# Patient Record
Sex: Male | Born: 1950 | Race: White | Hispanic: No | Smoking: Former smoker
Health system: Southern US, Community
[De-identification: ages and names within clinical notes are randomized; demographics above are authoritative.]

## PROBLEM LIST (undated history)

## (undated) DIAGNOSIS — F1020 Alcohol dependence, uncomplicated: Secondary | ICD-10-CM

## (undated) DIAGNOSIS — I2510 Atherosclerotic heart disease of native coronary artery without angina pectoris: Secondary | ICD-10-CM

## (undated) DIAGNOSIS — S2249XA Multiple fractures of ribs, unspecified side, initial encounter for closed fracture: Secondary | ICD-10-CM

## (undated) DIAGNOSIS — K289 Gastrojejunal ulcer, unspecified as acute or chronic, without hemorrhage or perforation: Secondary | ICD-10-CM

## (undated) DIAGNOSIS — J40 Bronchitis, not specified as acute or chronic: Secondary | ICD-10-CM

## (undated) DIAGNOSIS — Z87440 Personal history of urinary (tract) infections: Secondary | ICD-10-CM

## (undated) DIAGNOSIS — Z8639 Personal history of other endocrine, nutritional and metabolic disease: Secondary | ICD-10-CM

## (undated) DIAGNOSIS — N400 Enlarged prostate without lower urinary tract symptoms: Secondary | ICD-10-CM

## (undated) DIAGNOSIS — K7030 Alcoholic cirrhosis of liver without ascites: Secondary | ICD-10-CM

## (undated) DIAGNOSIS — I350 Nonrheumatic aortic (valve) stenosis: Secondary | ICD-10-CM

## (undated) DIAGNOSIS — S2239XA Fracture of one rib, unspecified side, initial encounter for closed fracture: Secondary | ICD-10-CM

## (undated) DIAGNOSIS — I10 Essential (primary) hypertension: Secondary | ICD-10-CM

## (undated) DIAGNOSIS — I480 Paroxysmal atrial fibrillation: Secondary | ICD-10-CM

## (undated) DIAGNOSIS — D72829 Elevated white blood cell count, unspecified: Secondary | ICD-10-CM

## (undated) DIAGNOSIS — R7303 Prediabetes: Secondary | ICD-10-CM

## (undated) DIAGNOSIS — E871 Hypo-osmolality and hyponatremia: Secondary | ICD-10-CM

## (undated) DIAGNOSIS — J309 Allergic rhinitis, unspecified: Secondary | ICD-10-CM

## (undated) HISTORY — DX: Gastrojejunal ulcer, unspecified as acute or chronic, without hemorrhage or perforation: K28.9

## (undated) HISTORY — DX: Personal history of other endocrine, nutritional and metabolic disease: Z86.39

## (undated) HISTORY — DX: Alcoholic cirrhosis of liver without ascites: K70.30

## (undated) HISTORY — DX: Benign prostatic hyperplasia without lower urinary tract symptoms: N40.0

## (undated) HISTORY — DX: Allergic rhinitis, unspecified: J30.9

## (undated) HISTORY — DX: Essential (primary) hypertension: I10

## (undated) HISTORY — DX: Multiple fractures of ribs, unspecified side, initial encounter for closed fracture: S22.49XA

## (undated) HISTORY — PX: VASECTOMY: SHX75

## (undated) HISTORY — DX: Prediabetes: R73.03

## (undated) HISTORY — DX: Atherosclerotic heart disease of native coronary artery without angina pectoris: I25.10

## (undated) HISTORY — DX: Nonrheumatic aortic (valve) stenosis: I35.0

## (undated) HISTORY — DX: Paroxysmal atrial fibrillation: I48.0

## (undated) HISTORY — PX: EYE SURGERY: SHX253

## (undated) HISTORY — DX: Bronchitis, not specified as acute or chronic: J40

## (undated) HISTORY — DX: Alcohol dependence, uncomplicated: F10.20

## (undated) HISTORY — DX: Personal history of urinary (tract) infections: Z87.440

## (undated) HISTORY — DX: Fracture of one rib, unspecified side, initial encounter for closed fracture: S22.39XA

## (undated) HISTORY — PX: SPLENECTOMY: SUR1306

## (undated) HISTORY — PX: KNEE ARTHROPLASTY: SHX992

## (undated) HISTORY — DX: Elevated white blood cell count, unspecified: D72.829

---

## 1984-08-28 DIAGNOSIS — Z9081 Acquired absence of spleen: Secondary | ICD-10-CM | POA: Insufficient documentation

## 1984-08-28 DIAGNOSIS — Z9089 Acquired absence of other organs: Secondary | ICD-10-CM

## 1998-03-18 ENCOUNTER — Ambulatory Visit (HOSPITAL_COMMUNITY): Admission: RE | Admit: 1998-03-18 | Discharge: 1998-03-18 | Payer: Self-pay | Admitting: Cardiology

## 1998-08-18 DIAGNOSIS — E785 Hyperlipidemia, unspecified: Secondary | ICD-10-CM | POA: Insufficient documentation

## 1998-08-18 DIAGNOSIS — E782 Mixed hyperlipidemia: Secondary | ICD-10-CM | POA: Insufficient documentation

## 2003-09-29 DIAGNOSIS — L719 Rosacea, unspecified: Secondary | ICD-10-CM | POA: Insufficient documentation

## 2003-09-29 DIAGNOSIS — L219 Seborrheic dermatitis, unspecified: Secondary | ICD-10-CM | POA: Insufficient documentation

## 2005-04-26 ENCOUNTER — Ambulatory Visit: Payer: Self-pay | Admitting: Family Medicine

## 2005-05-03 ENCOUNTER — Ambulatory Visit: Payer: Self-pay | Admitting: *Deleted

## 2005-06-28 ENCOUNTER — Ambulatory Visit: Payer: Self-pay | Admitting: Family Medicine

## 2005-11-21 ENCOUNTER — Ambulatory Visit: Payer: Self-pay | Admitting: Family Medicine

## 2006-01-26 ENCOUNTER — Encounter (INDEPENDENT_AMBULATORY_CARE_PROVIDER_SITE_OTHER): Payer: Self-pay | Admitting: Family Medicine

## 2006-01-26 LAB — CONVERTED CEMR LAB: PSA: NORMAL ng/mL

## 2006-02-01 ENCOUNTER — Ambulatory Visit: Payer: Self-pay | Admitting: Family Medicine

## 2006-02-14 ENCOUNTER — Emergency Department (HOSPITAL_COMMUNITY): Admission: EM | Admit: 2006-02-14 | Discharge: 2006-02-14 | Payer: Self-pay | Admitting: Emergency Medicine

## 2006-02-14 IMAGING — CR DG CHEST 1V PORT
1 series · 1 of 1 positions shown · non-contrast
Comparison: none

CLINICAL DATA: Chest pain.
 PORTABLE CHEST - 1 VIEW:

[view not recorded]
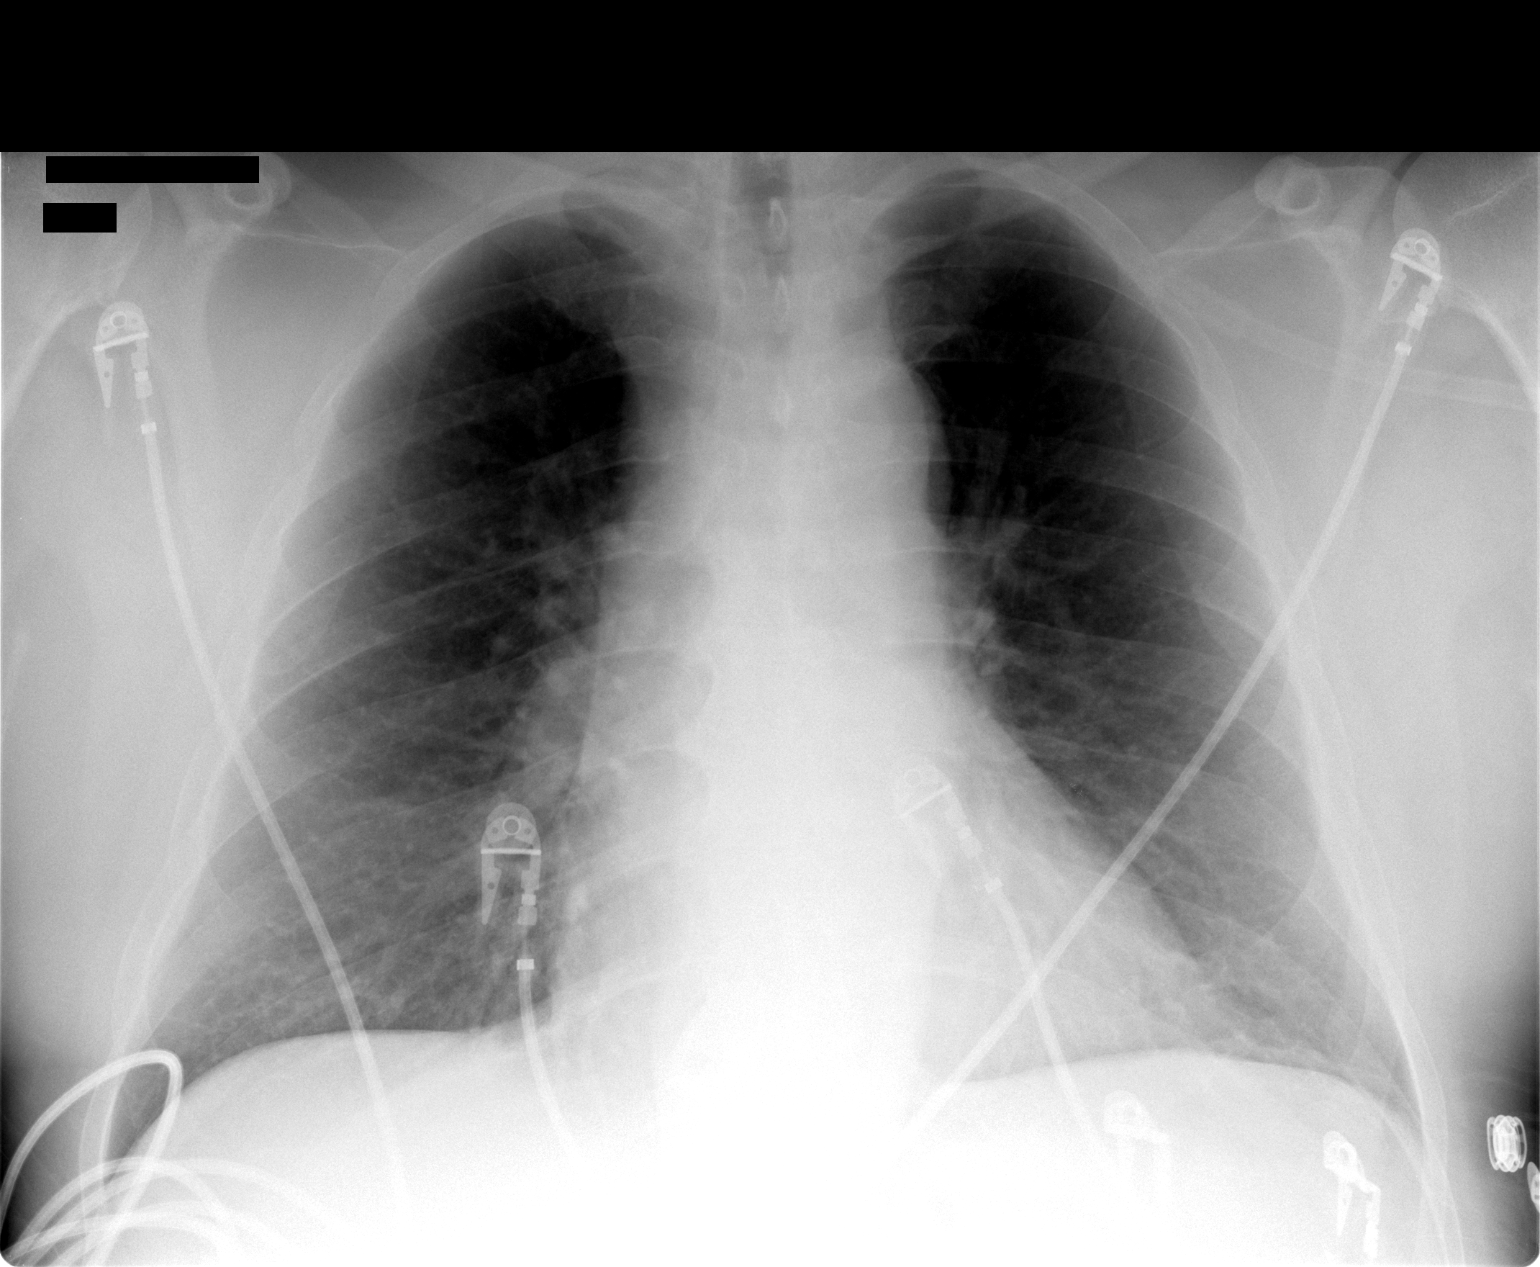

[1 of 1 positions shown; findings below may reference images not displayed]

FINDINGS: The heart size is normal.  There are no effusions.  Mild atelectasis is seen at the left base.
IMPRESSION: Mild atelectasis left base.

## 2006-02-20 ENCOUNTER — Ambulatory Visit: Payer: Self-pay | Admitting: Family Medicine

## 2006-03-21 ENCOUNTER — Ambulatory Visit: Payer: Self-pay | Admitting: Family Medicine

## 2006-12-18 ENCOUNTER — Ambulatory Visit: Payer: Self-pay | Admitting: Family Medicine

## 2006-12-25 ENCOUNTER — Ambulatory Visit: Payer: Self-pay | Admitting: Family Medicine

## 2007-01-05 ENCOUNTER — Encounter (INDEPENDENT_AMBULATORY_CARE_PROVIDER_SITE_OTHER): Payer: Self-pay | Admitting: Family Medicine

## 2007-01-05 DIAGNOSIS — Z862 Personal history of diseases of the blood and blood-forming organs and certain disorders involving the immune mechanism: Secondary | ICD-10-CM | POA: Insufficient documentation

## 2007-01-05 DIAGNOSIS — I10 Essential (primary) hypertension: Secondary | ICD-10-CM | POA: Insufficient documentation

## 2007-01-05 DIAGNOSIS — I2510 Atherosclerotic heart disease of native coronary artery without angina pectoris: Secondary | ICD-10-CM

## 2007-01-13 DIAGNOSIS — F1010 Alcohol abuse, uncomplicated: Secondary | ICD-10-CM

## 2007-01-13 DIAGNOSIS — F1021 Alcohol dependence, in remission: Secondary | ICD-10-CM | POA: Insufficient documentation

## 2007-05-15 ENCOUNTER — Encounter (INDEPENDENT_AMBULATORY_CARE_PROVIDER_SITE_OTHER): Payer: Self-pay | Admitting: *Deleted

## 2007-10-03 ENCOUNTER — Ambulatory Visit: Payer: Self-pay | Admitting: Nurse Practitioner

## 2007-12-23 ENCOUNTER — Ambulatory Visit: Payer: Self-pay | Admitting: Family Medicine

## 2007-12-23 DIAGNOSIS — J309 Allergic rhinitis, unspecified: Secondary | ICD-10-CM | POA: Insufficient documentation

## 2007-12-31 ENCOUNTER — Ambulatory Visit: Payer: Self-pay | Admitting: Family Medicine

## 2008-01-02 LAB — CONVERTED CEMR LAB
AST: 38 units/L — ABNORMAL HIGH (ref 0–37)
Alkaline Phosphatase: 69 units/L (ref 39–117)
BUN: 7 mg/dL (ref 6–23)
Basophils Absolute: 0.1 10*3/uL (ref 0.0–0.1)
Chloride: 100 meq/L (ref 96–112)
Creatinine, Ser: 0.71 mg/dL (ref 0.40–1.50)
Eosinophils Relative: 3 % (ref 0–5)
HCT: 45.3 % (ref 39.0–52.0)
Hemoglobin: 14.9 g/dL (ref 13.0–17.0)
MCHC: 32.9 g/dL (ref 30.0–36.0)
Monocytes Absolute: 1 10*3/uL (ref 0.1–1.0)
Neutro Abs: 5.2 10*3/uL (ref 1.7–7.7)
Neutrophils Relative %: 46 % (ref 43–77)
Platelets: 395 10*3/uL (ref 150–400)
RDW: 14.1 % (ref 11.5–15.5)
TSH: 1.482 microintl units/mL (ref 0.350–5.50)
Total Bilirubin: 0.7 mg/dL (ref 0.3–1.2)
Total CHOL/HDL Ratio: 2.5
WBC: 11.5 10*3/uL — ABNORMAL HIGH (ref 4.0–10.5)

## 2008-01-07 ENCOUNTER — Ambulatory Visit: Payer: Self-pay | Admitting: Family Medicine

## 2008-01-07 LAB — CONVERTED CEMR LAB
Blood Glucose, Fingerstick: 154
Hgb A1c MFr Bld: 5.6 %

## 2008-01-13 ENCOUNTER — Encounter (INDEPENDENT_AMBULATORY_CARE_PROVIDER_SITE_OTHER): Payer: Self-pay | Admitting: Family Medicine

## 2008-01-13 LAB — CONVERTED CEMR LAB
HCV Ab: NEGATIVE
Hep B Core Total Ab: NEGATIVE
Hep B E Ab: NEGATIVE

## 2009-05-21 ENCOUNTER — Telehealth: Payer: Self-pay | Admitting: Physician Assistant

## 2009-06-01 ENCOUNTER — Ambulatory Visit: Payer: Self-pay | Admitting: Physician Assistant

## 2009-06-01 LAB — CONVERTED CEMR LAB
Cholesterol, target level: 200 mg/dL
HDL goal, serum: 40 mg/dL

## 2009-06-02 ENCOUNTER — Encounter: Payer: Self-pay | Admitting: Physician Assistant

## 2009-06-04 ENCOUNTER — Encounter: Payer: Self-pay | Admitting: Physician Assistant

## 2009-06-04 LAB — CONVERTED CEMR LAB
AST: 69 units/L — ABNORMAL HIGH (ref 0–37)
Albumin: 4.3 g/dL (ref 3.5–5.2)
BUN: 7 mg/dL (ref 6–23)
CO2: 22 meq/L (ref 19–32)
Calcium: 9.6 mg/dL (ref 8.4–10.5)
Chloride: 92 meq/L — ABNORMAL LOW (ref 96–112)
Cholesterol: 199 mg/dL (ref 0–200)
Eosinophils Relative: 1 % (ref 0–5)
HCT: 48 % (ref 39.0–52.0)
Lymphocytes Relative: 39 % (ref 12–46)
Lymphs Abs: 4.6 10*3/uL — ABNORMAL HIGH (ref 0.7–4.0)
MCHC: 35.4 g/dL (ref 30.0–36.0)
Monocytes Relative: 12 % (ref 3–12)
Neutrophils Relative %: 47 % (ref 43–77)
RBC: 4.75 M/uL (ref 4.22–5.81)
Sodium: 132 meq/L — ABNORMAL LOW (ref 135–145)
Total Protein: 8.6 g/dL — ABNORMAL HIGH (ref 6.0–8.3)
Triglycerides: 122 mg/dL (ref <150)
VLDL: 24 mg/dL (ref 0–40)
WBC: 12 10*3/uL — ABNORMAL HIGH (ref 4.0–10.5)

## 2009-06-07 LAB — CONVERTED CEMR LAB: Folate: >20 ng/mL

## 2009-06-15 ENCOUNTER — Telehealth: Payer: Self-pay | Admitting: Physician Assistant

## 2009-06-18 ENCOUNTER — Telehealth: Payer: Self-pay | Admitting: Physician Assistant

## 2009-06-30 ENCOUNTER — Ambulatory Visit: Payer: Self-pay | Admitting: Physician Assistant

## 2009-07-01 DIAGNOSIS — E739 Lactose intolerance, unspecified: Secondary | ICD-10-CM | POA: Insufficient documentation

## 2009-07-01 DIAGNOSIS — E7439 Other disorders of intestinal carbohydrate absorption: Secondary | ICD-10-CM | POA: Insufficient documentation

## 2009-07-02 ENCOUNTER — Ambulatory Visit: Payer: Self-pay | Admitting: Physician Assistant

## 2009-07-19 LAB — CONVERTED CEMR LAB
Alkaline Phosphatase: 80 units/L (ref 39–117)
CO2: 22 meq/L (ref 19–32)
Calcium: 9.7 mg/dL (ref 8.4–10.5)
Creatinine, Ser: 0.72 mg/dL (ref 0.40–1.50)
Sodium: 133 meq/L — ABNORMAL LOW (ref 135–145)
Total Bilirubin: 0.7 mg/dL (ref 0.3–1.2)
Total Protein: 8.1 g/dL (ref 6.0–8.3)

## 2009-08-28 HISTORY — PX: PROSTATECTOMY: SHX69

## 2009-09-30 ENCOUNTER — Ambulatory Visit: Payer: Self-pay | Admitting: Physician Assistant

## 2009-09-30 ENCOUNTER — Telehealth: Payer: Self-pay | Admitting: Physician Assistant

## 2009-09-30 DIAGNOSIS — N401 Enlarged prostate with lower urinary tract symptoms: Secondary | ICD-10-CM

## 2009-09-30 DIAGNOSIS — N400 Enlarged prostate without lower urinary tract symptoms: Secondary | ICD-10-CM | POA: Insufficient documentation

## 2009-09-30 DIAGNOSIS — M7020 Olecranon bursitis, unspecified elbow: Secondary | ICD-10-CM

## 2009-10-05 ENCOUNTER — Ambulatory Visit: Payer: Self-pay | Admitting: Physician Assistant

## 2009-10-05 ENCOUNTER — Encounter: Payer: Self-pay | Admitting: Physician Assistant

## 2009-10-11 ENCOUNTER — Telehealth: Payer: Self-pay | Admitting: Physician Assistant

## 2009-10-14 ENCOUNTER — Encounter: Payer: Self-pay | Admitting: Physician Assistant

## 2009-10-14 LAB — CONVERTED CEMR LAB: Osmolality, Ur: 177 mOsm/kg — ABNORMAL LOW (ref 390–1090)

## 2009-10-21 ENCOUNTER — Telehealth: Payer: Self-pay | Admitting: Physician Assistant

## 2009-11-17 ENCOUNTER — Ambulatory Visit: Payer: Self-pay | Admitting: Physician Assistant

## 2009-11-17 DIAGNOSIS — D72829 Elevated white blood cell count, unspecified: Secondary | ICD-10-CM | POA: Insufficient documentation

## 2009-11-22 LAB — CONVERTED CEMR LAB
Alkaline Phosphatase: 84 units/L (ref 39–117)
BUN: 6 mg/dL (ref 6–23)
Basophils Absolute: 0.1 10*3/uL (ref 0.0–0.1)
Basophils Relative: 0 % (ref 0–1)
CO2: 22 meq/L (ref 19–32)
Calcium: 9.5 mg/dL (ref 8.4–10.5)
Chloride: 90 meq/L — ABNORMAL LOW (ref 96–112)
Eosinophils Absolute: 0.2 10*3/uL (ref 0.0–0.7)
Eosinophils Relative: 1 % (ref 0–5)
Glucose, Bld: 119 mg/dL — ABNORMAL HIGH (ref 70–99)
HCT: 38.8 % — ABNORMAL LOW (ref 39.0–52.0)
Lymphocytes Relative: 33 % (ref 12–46)
MCHC: 32 g/dL (ref 30.0–36.0)
MCV: 106.3 fL — ABNORMAL HIGH (ref 78.0–100.0)
Neutrophils Relative %: 56 % (ref 43–77)
Osmolality, Ur: 285 mOsm/kg — ABNORMAL LOW (ref 390–1090)
Platelets: 392 10*3/uL (ref 150–400)
RBC: 3.65 M/uL — ABNORMAL LOW (ref 4.22–5.81)
RDW: 14.9 % (ref 11.5–15.5)
Sodium, Ur: 45 meq/L
WBC: 17 10*3/uL — ABNORMAL HIGH (ref 4.0–10.5)

## 2009-12-01 ENCOUNTER — Ambulatory Visit: Payer: Self-pay | Admitting: Physician Assistant

## 2009-12-03 LAB — CONVERTED CEMR LAB
Retic Ct Pct: 2.5 % (ref 0.4–3.1)
TIBC: 329 ug/dL (ref 215–435)
UIBC: 219 ug/dL

## 2009-12-06 ENCOUNTER — Encounter: Payer: Self-pay | Admitting: Physician Assistant

## 2009-12-06 LAB — CONVERTED CEMR LAB: Vitamin B-12: >2000 pg/mL — ABNORMAL HIGH (ref 211–911)

## 2009-12-08 ENCOUNTER — Ambulatory Visit (HOSPITAL_COMMUNITY): Admission: RE | Admit: 2009-12-08 | Discharge: 2009-12-08 | Payer: Self-pay | Admitting: Internal Medicine

## 2009-12-08 ENCOUNTER — Ambulatory Visit: Payer: Self-pay | Admitting: Cardiovascular Disease

## 2009-12-08 ENCOUNTER — Encounter (INDEPENDENT_AMBULATORY_CARE_PROVIDER_SITE_OTHER): Payer: Self-pay | Admitting: Internal Medicine

## 2009-12-08 ENCOUNTER — Encounter: Payer: Self-pay | Admitting: Physician Assistant

## 2009-12-08 IMAGING — CR DG CHEST 2V
2 series · 2 of 2 positions shown · non-contrast
Comparison: [DATE].

CLINICAL DATA: 59-year-old male with hypertension.

CHEST - 2 VIEW

[w chest pa]
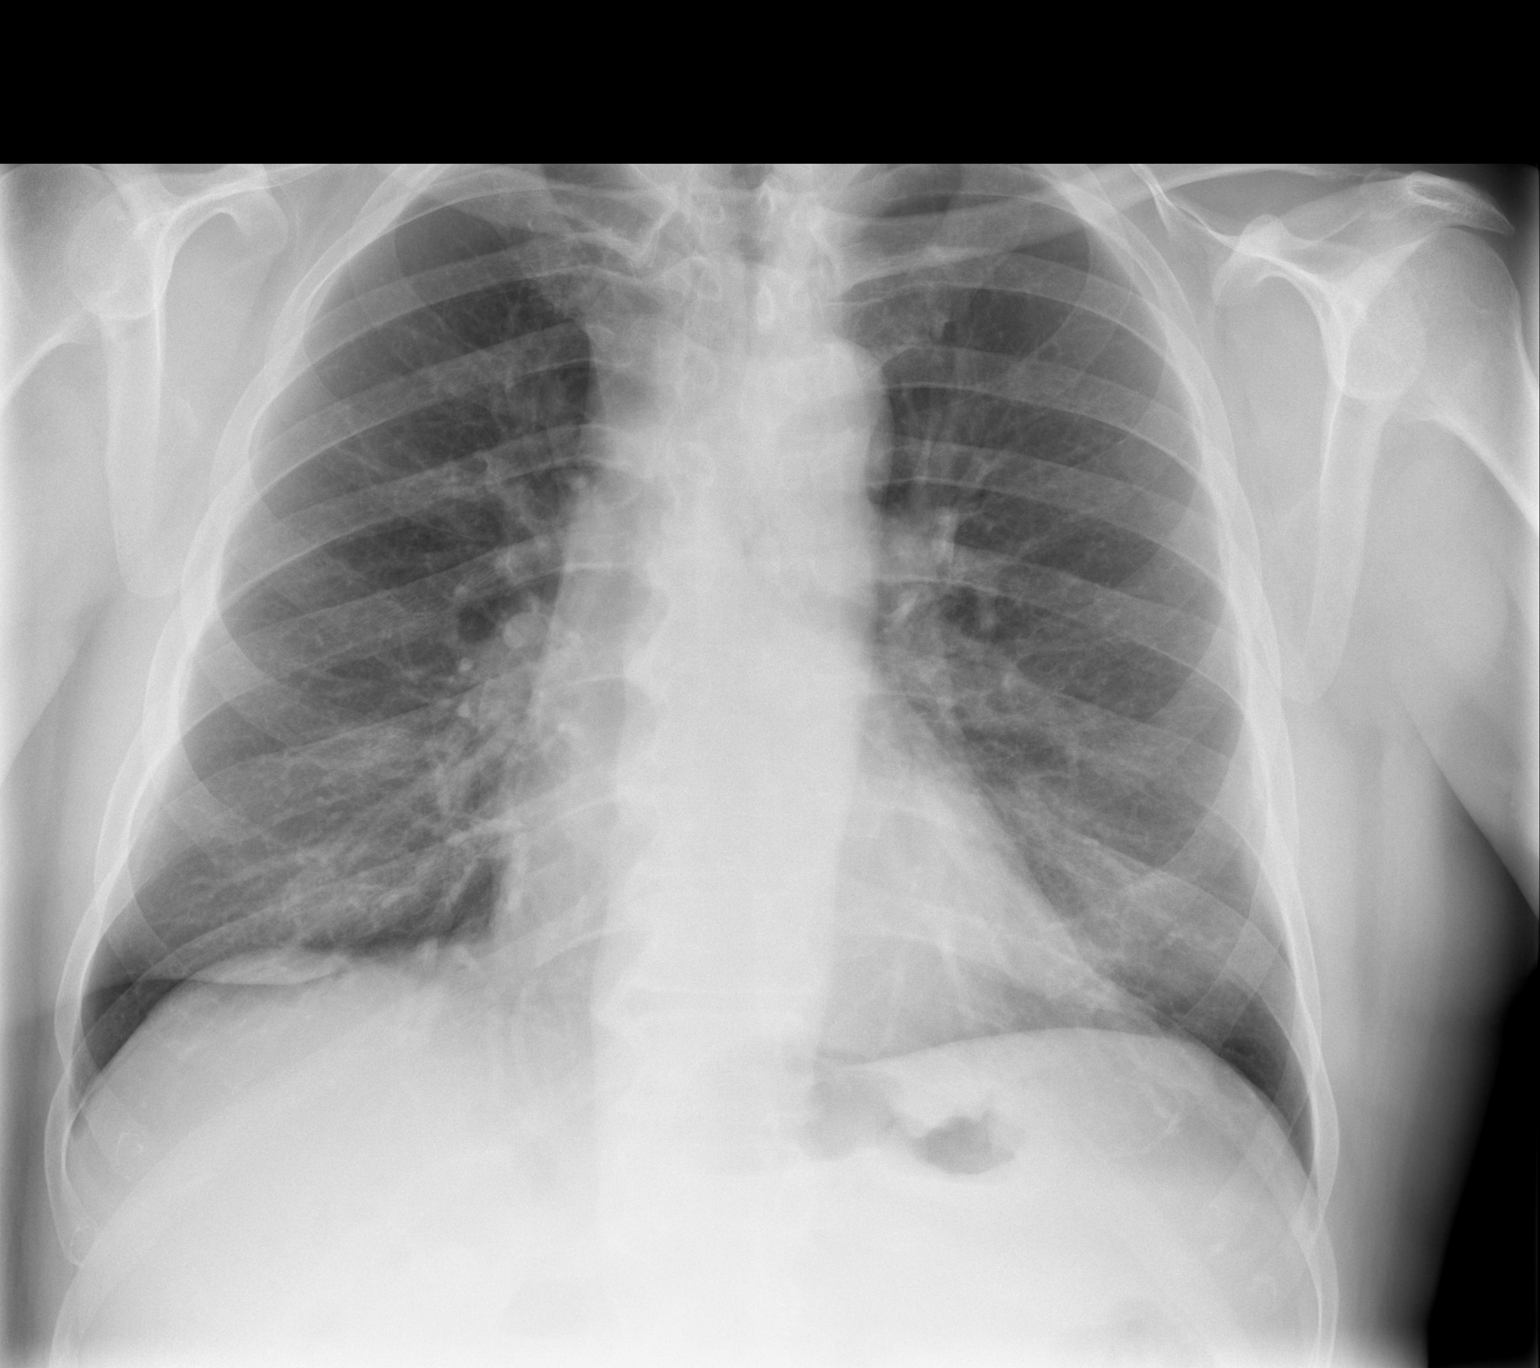

[w chest lat]
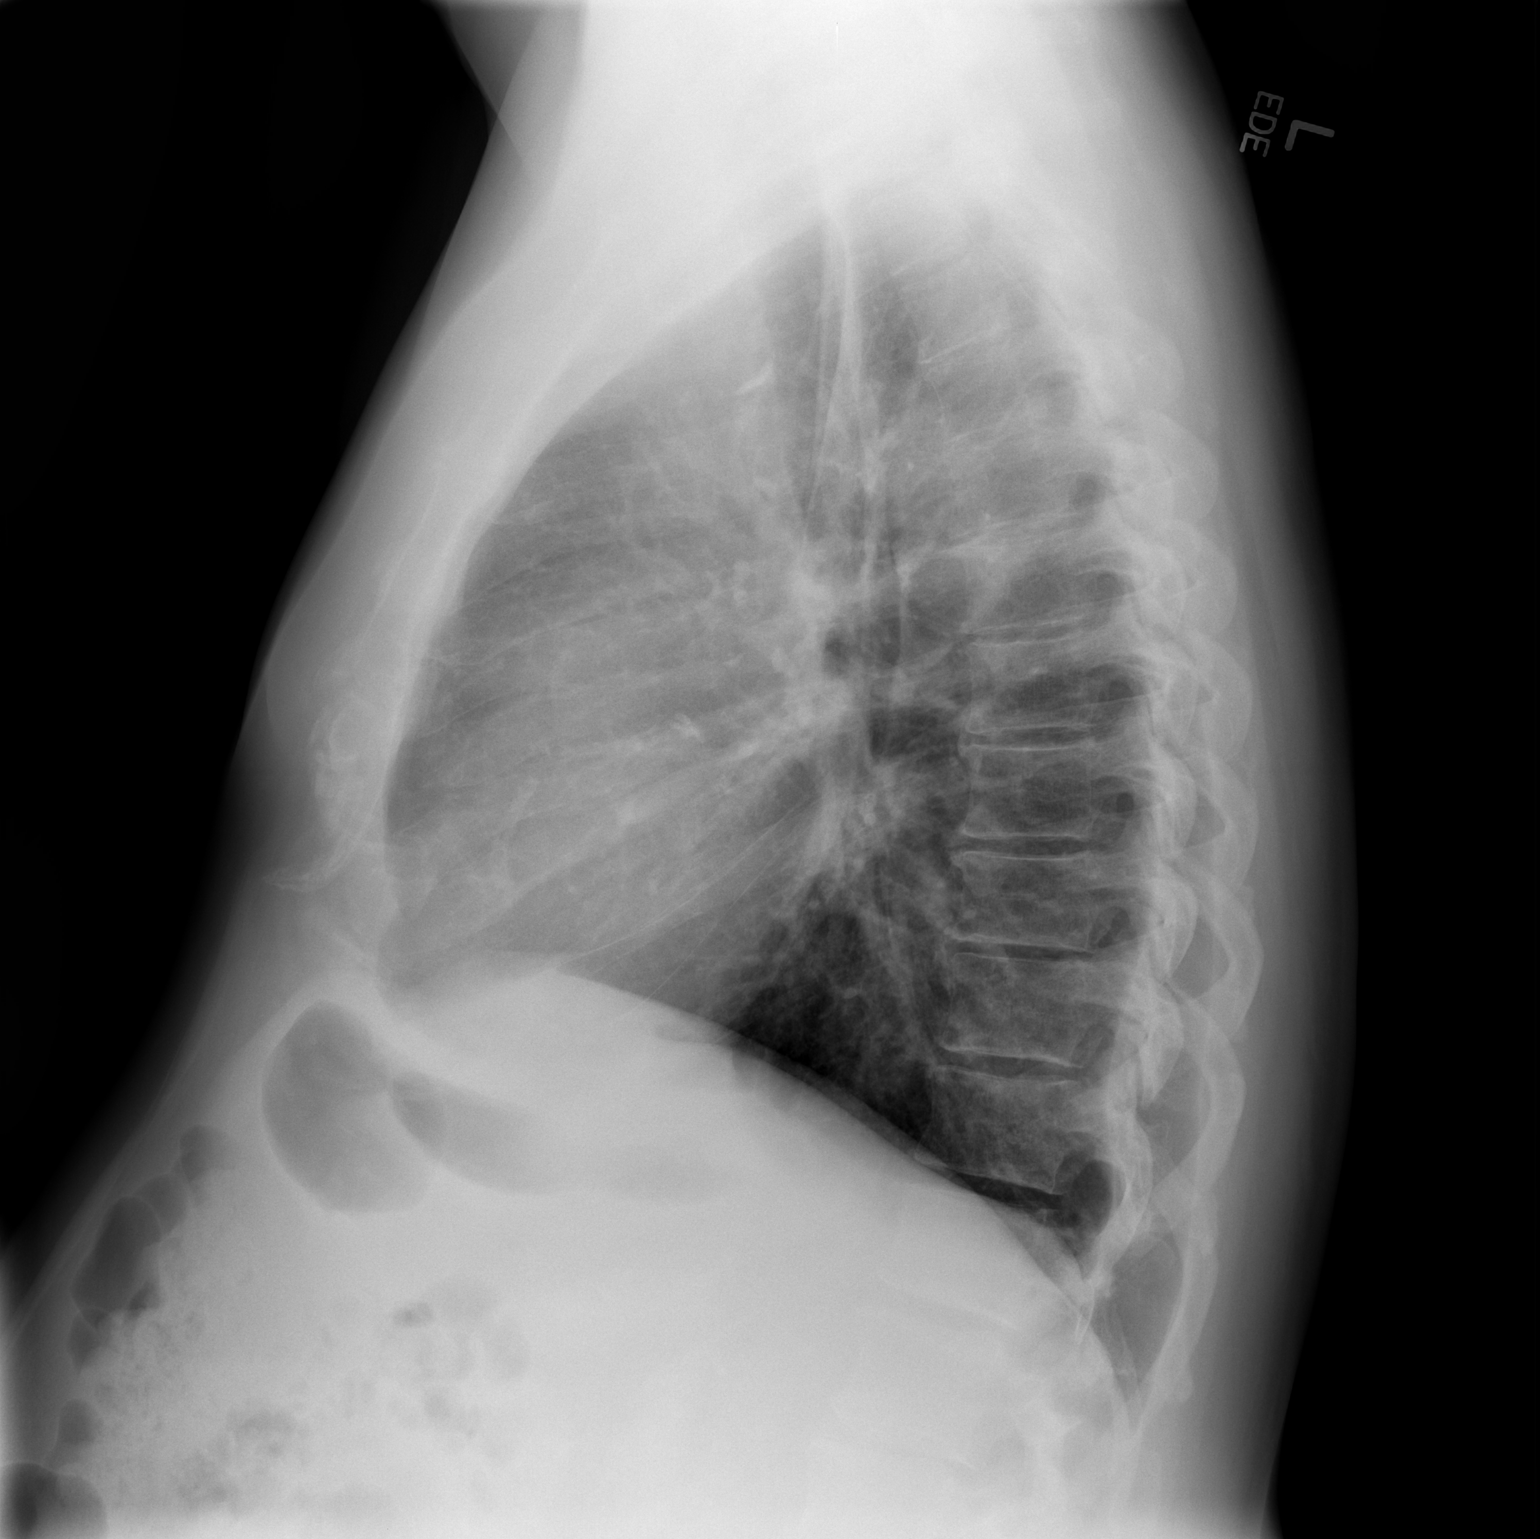

[2 of 2 positions shown; findings below may reference images not displayed]

FINDINGS: Lung volumes are within normal limits.  Stable cardiac
size and mediastinal contours.  Cardiac size is at the upper limits
of normal. Visualized tracheal air column is within normal limits.
Stable mild basilar predominant interstitial prominence.  No acute
airspace opacity or other abnormal pulmonary opacity. No acute
osseous abnormality identified.
IMPRESSION: No acute cardiopulmonary abnormality.

## 2009-12-13 ENCOUNTER — Telehealth: Payer: Self-pay | Admitting: Physician Assistant

## 2009-12-13 ENCOUNTER — Encounter: Payer: Self-pay | Admitting: Physician Assistant

## 2010-03-04 ENCOUNTER — Ambulatory Visit: Payer: Self-pay | Admitting: Physician Assistant

## 2010-03-04 DIAGNOSIS — R310 Gross hematuria: Secondary | ICD-10-CM | POA: Insufficient documentation

## 2010-03-05 ENCOUNTER — Encounter: Payer: Self-pay | Admitting: Physician Assistant

## 2010-03-07 ENCOUNTER — Encounter: Payer: Self-pay | Admitting: Physician Assistant

## 2010-03-08 ENCOUNTER — Ambulatory Visit (HOSPITAL_COMMUNITY): Admission: RE | Admit: 2010-03-08 | Discharge: 2010-03-08 | Payer: Self-pay | Admitting: Internal Medicine

## 2010-03-08 IMAGING — CT CT ABD-PELV W/O CM
2 of 4 series · 17 of 46 positions shown, 19 images · non-contrast
Comparison: None.

CLINICAL DATA: Gross hematuria

CT ABDOMEN AND PELVIS WITHOUT CONTRAST
TECHNIQUE: Multidetector CT imaging of the abdomen and pelvis was
performed following the standard protocol without intravenous
contrast.

[Series 2: renal stone · axial · 0.85mm/px · z∈[-500,-85]mm · 14 of 93 slices shown, 16 images]
[im 5/93  soft-tissue]
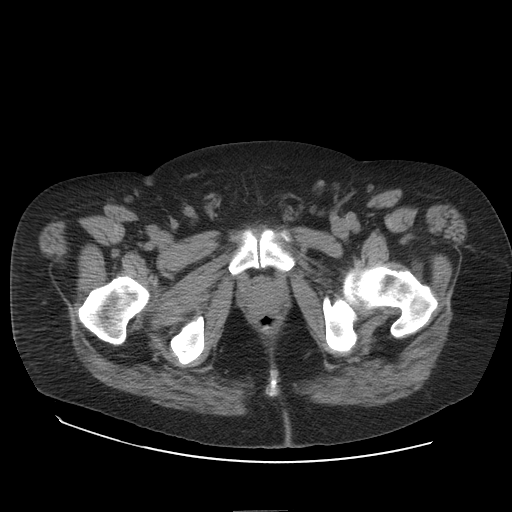
[im 5/93  bone]
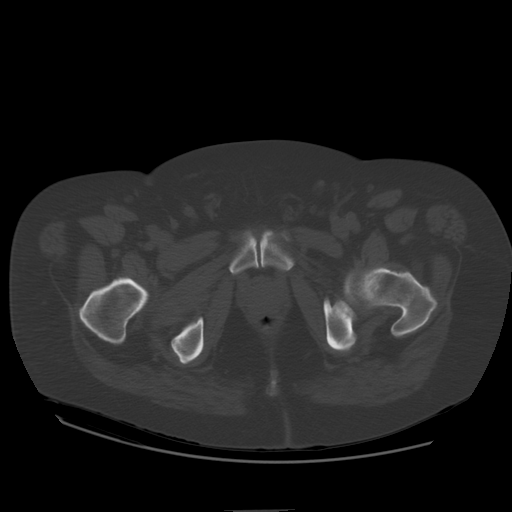
[im 13/93  soft-tissue]
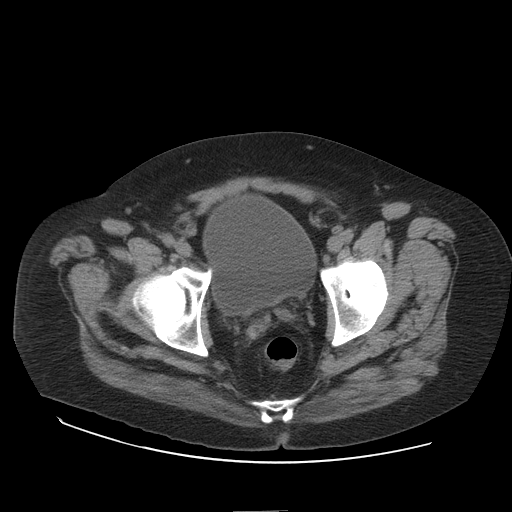
[im 17/93  soft-tissue]
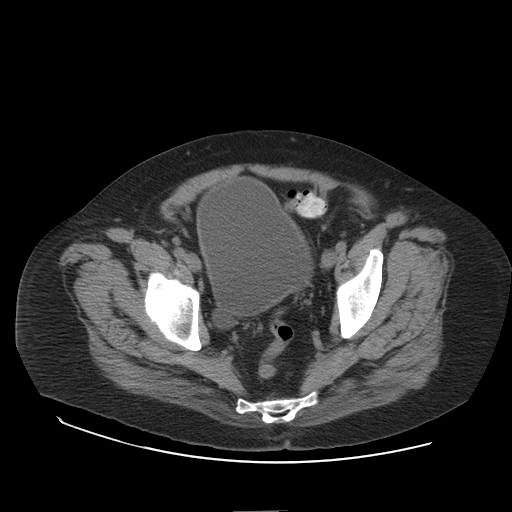
[im 26/93  soft-tissue]
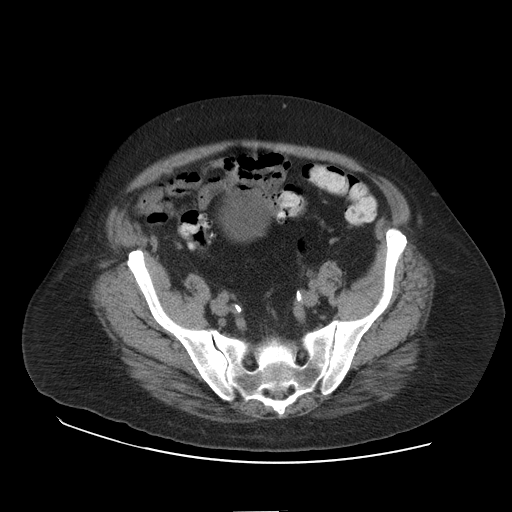
[im 30/93  soft-tissue]
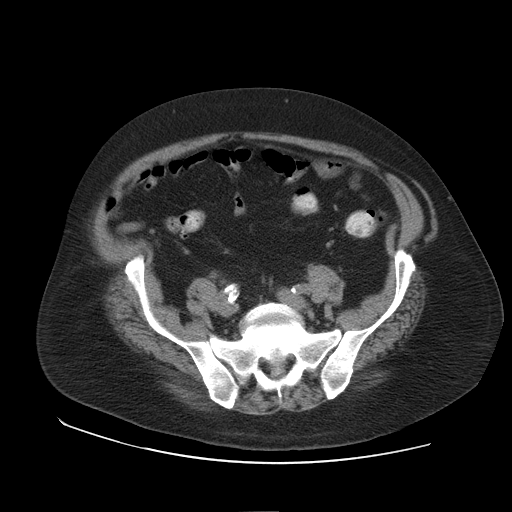
[im 38/93  soft-tissue]
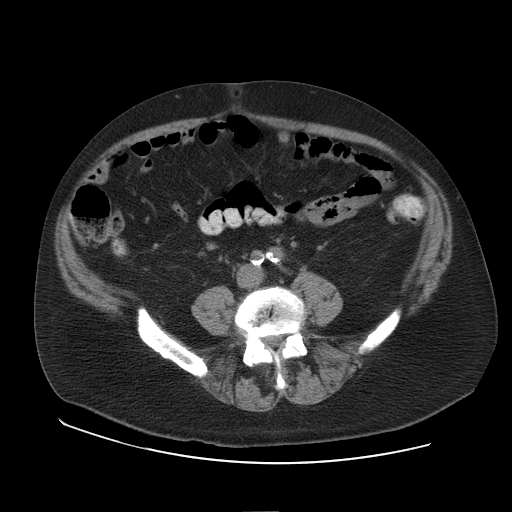
[im 42/93  soft-tissue]
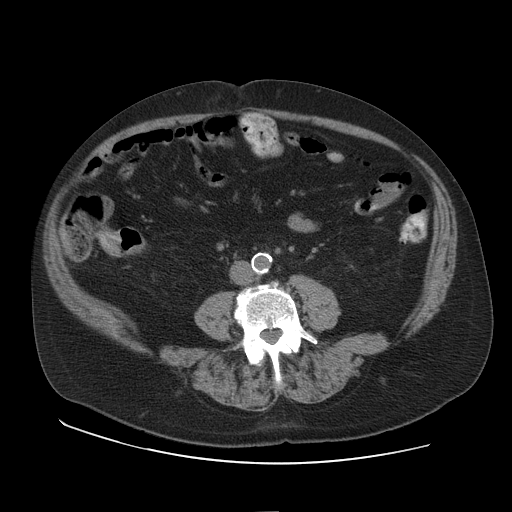
[im 51/93  soft-tissue]
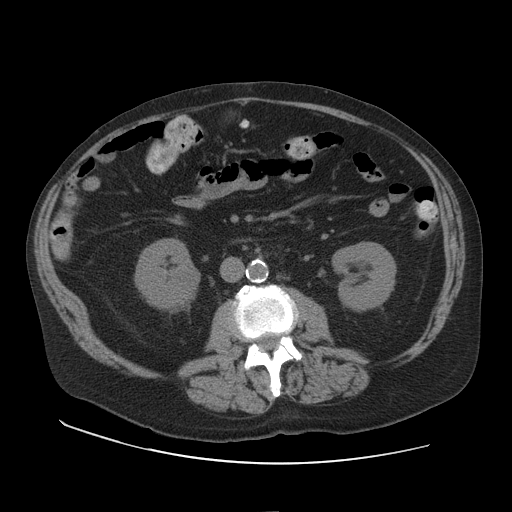
[im 55/93  soft-tissue]
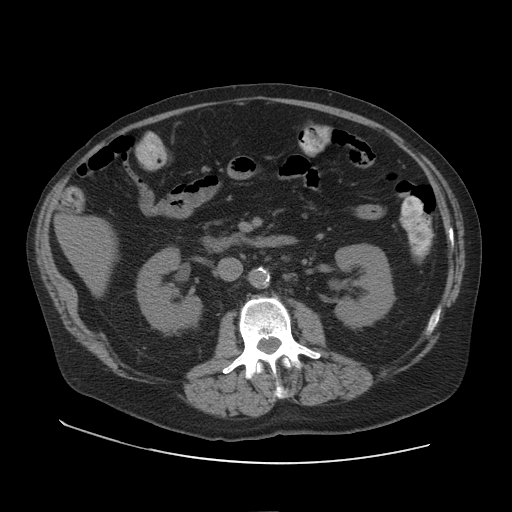
[im 55/93  bone]
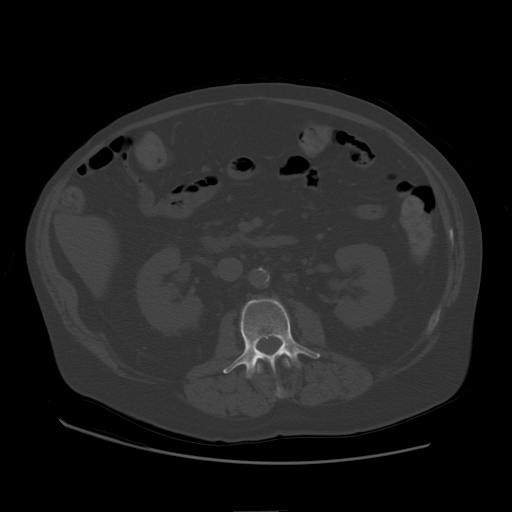
[im 63/93  soft-tissue]
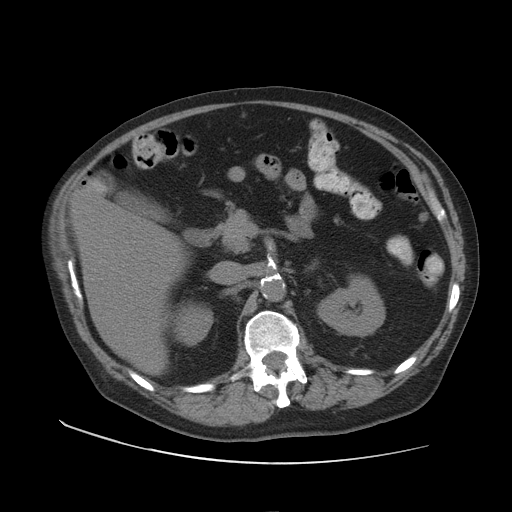
[im 67/93  soft-tissue]
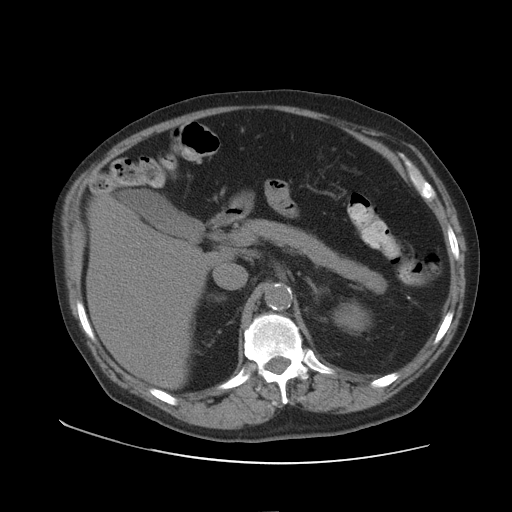
[im 76/93  soft-tissue]
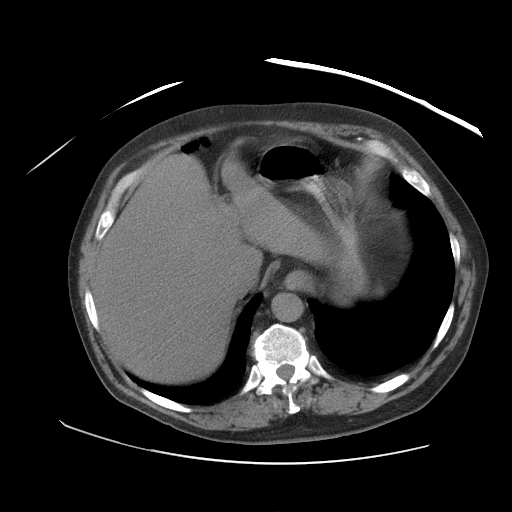
[im 80/93  soft-tissue]
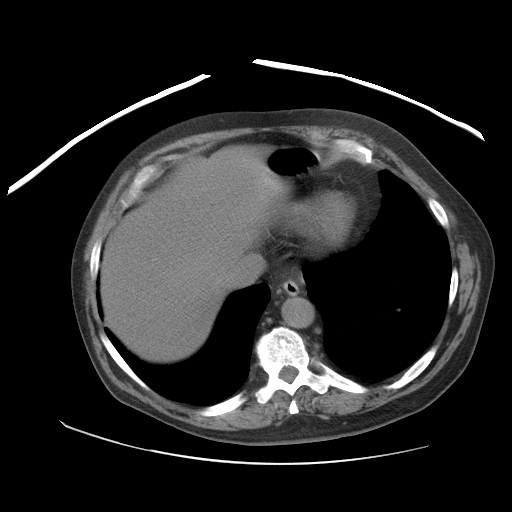
[im 88/93  soft-tissue]
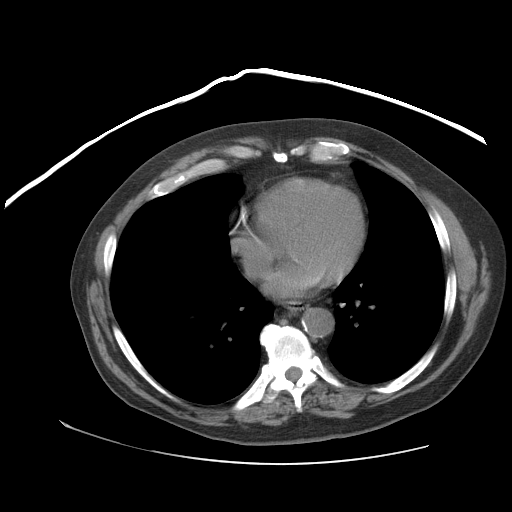

[Series 401: cor · coronal · 0.98mm/px · 3 of 111 slices shown]
[im 37/111  soft-tissue]
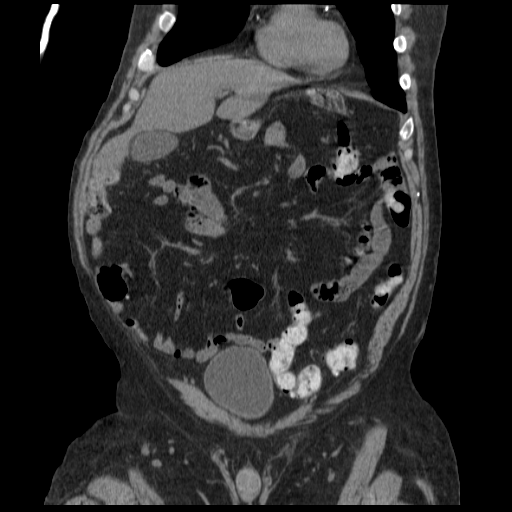
[im 49/111  soft-tissue]
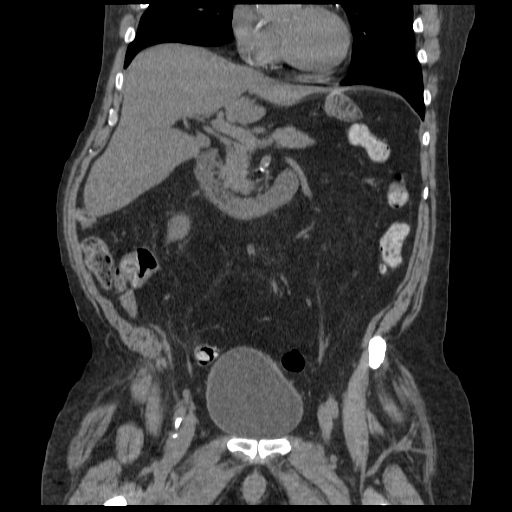
[im 62/111  soft-tissue]
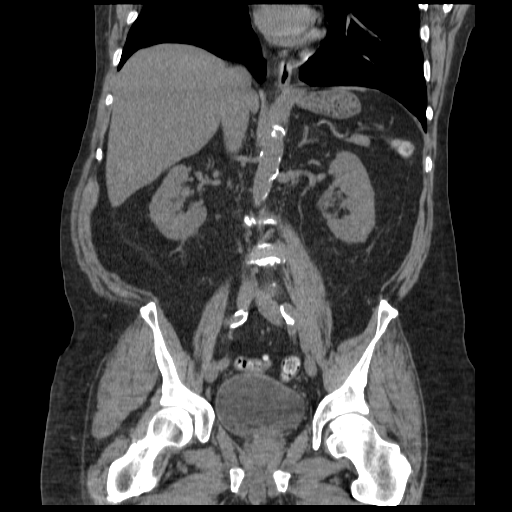

[17 of 46 positions shown; findings below may reference images not displayed]

FINDINGS: Lung bases are clear.

Non-IV contrast images demonstrate no focal hepatic lesions.
Gallbladder, pancreas, adrenal glands, and kidneys are normal.
There is no evidence of nephrolithiasis or ureterolithiasis.  The
spleen is surgically absent.

The stomach, small bowel, appendix, and cecum are normal.  The
colon contains multiple diverticula in the descending colon and
sigmoid colon.  No acute inflammation.

Abdominal aorta is normal caliber.  No retroperitoneal
lymphadenopathy.

No evidence of bladder stones.  There is a small diverticulum along
the posterior right aspect of the bladder.  Prostate gland is
normal.  No evidence of pelvic lymphadenopathy. Review of  bone
windows demonstrates no aggressive osseous lesions.
IMPRESSION: 1. No explanation hematuria.  No evidence of nephrolithiasis or
ureterolithiasis.  Small bladder diverticulum is noted.
2.  Prior splenectomy.
3.  Sigmoid diverticulosis without evidence of acute
diverticulitis.

## 2010-03-09 DIAGNOSIS — N390 Urinary tract infection, site not specified: Secondary | ICD-10-CM

## 2010-04-04 ENCOUNTER — Ambulatory Visit: Payer: Self-pay | Admitting: Physician Assistant

## 2010-04-04 DIAGNOSIS — R609 Edema, unspecified: Secondary | ICD-10-CM | POA: Insufficient documentation

## 2010-04-05 ENCOUNTER — Encounter: Payer: Self-pay | Admitting: Physician Assistant

## 2010-04-06 ENCOUNTER — Encounter (INDEPENDENT_AMBULATORY_CARE_PROVIDER_SITE_OTHER): Payer: Self-pay | Admitting: *Deleted

## 2010-04-20 ENCOUNTER — Ambulatory Visit: Payer: Self-pay | Admitting: Physician Assistant

## 2010-04-22 LAB — CONVERTED CEMR LAB
BUN: 6 mg/dL (ref 6–23)
Calcium: 8.9 mg/dL (ref 8.4–10.5)
Chloride: 88 meq/L — ABNORMAL LOW (ref 96–112)
Potassium: 5.5 meq/L — ABNORMAL HIGH (ref 3.5–5.3)
Sodium: 121 meq/L — ABNORMAL LOW (ref 135–145)

## 2010-05-04 ENCOUNTER — Ambulatory Visit: Payer: Self-pay | Admitting: Physician Assistant

## 2010-05-12 LAB — CONVERTED CEMR LAB
CO2: 21 meq/L (ref 19–32)
Calcium: 9 mg/dL (ref 8.4–10.5)

## 2010-05-17 ENCOUNTER — Ambulatory Visit: Payer: Self-pay | Admitting: Physician Assistant

## 2010-05-18 ENCOUNTER — Encounter: Payer: Self-pay | Admitting: Physician Assistant

## 2010-05-18 LAB — CONVERTED CEMR LAB
Chloride: 92 meq/L — ABNORMAL LOW (ref 96–112)
Glucose, Bld: 180 mg/dL — ABNORMAL HIGH (ref 70–99)
Potassium: 5 meq/L (ref 3.5–5.3)
Sodium: 128 meq/L — ABNORMAL LOW (ref 135–145)

## 2010-05-31 ENCOUNTER — Ambulatory Visit: Payer: Self-pay | Admitting: Physician Assistant

## 2010-05-31 LAB — CONVERTED CEMR LAB
Bilirubin Urine: NEGATIVE
Ketones, urine, test strip: NEGATIVE
Nitrite: NEGATIVE
Urobilinogen, UA: 0.2
pH: 5.5

## 2010-06-01 ENCOUNTER — Encounter: Payer: Self-pay | Admitting: Physician Assistant

## 2010-06-03 LAB — CONVERTED CEMR LAB
BUN: 8 mg/dL (ref 6–23)
Bacteria, UA: NONE SEEN
CO2: 21 meq/L (ref 19–32)
Casts: NONE SEEN /lpf
Creatinine, Ser: 0.95 mg/dL (ref 0.40–1.50)
Crystals: NONE SEEN
Glucose, Bld: 108 mg/dL — ABNORMAL HIGH (ref 70–99)
HCT: 38.9 % — ABNORMAL LOW (ref 39.0–52.0)
Lymphocytes Relative: 43 % (ref 12–46)
Monocytes Absolute: 1.9 10*3/uL — ABNORMAL HIGH (ref 0.1–1.0)
Monocytes Relative: 14 % — ABNORMAL HIGH (ref 3–12)
Neutro Abs: 5.4 10*3/uL (ref 1.7–7.7)
Platelets: 392 10*3/uL (ref 150–400)
Potassium: 4.7 meq/L (ref 3.5–5.3)
RBC / HPF: >50 — AB (ref <3)
RBC: 3.66 M/uL — ABNORMAL LOW (ref 4.22–5.81)
Sodium: 122 meq/L — ABNORMAL LOW (ref 135–145)
WBC, UA: >50 cells/hpf — AB (ref <3)
WBC: 13.7 10*3/uL — ABNORMAL HIGH (ref 4.0–10.5)

## 2010-06-06 ENCOUNTER — Encounter (INDEPENDENT_AMBULATORY_CARE_PROVIDER_SITE_OTHER): Payer: Self-pay | Admitting: *Deleted

## 2010-06-14 ENCOUNTER — Telehealth: Payer: Self-pay | Admitting: Physician Assistant

## 2010-07-15 ENCOUNTER — Telehealth (INDEPENDENT_AMBULATORY_CARE_PROVIDER_SITE_OTHER): Payer: Self-pay | Admitting: Nurse Practitioner

## 2010-09-06 ENCOUNTER — Ambulatory Visit: Admit: 2010-09-06 | Payer: Self-pay | Admitting: Physician Assistant

## 2010-09-25 LAB — CONVERTED CEMR LAB
ALT: 27 units/L (ref 0–53)
AST: 54 units/L — ABNORMAL HIGH (ref 0–37)
Albumin: 3.9 g/dL (ref 3.5–5.2)
BUN: 4 mg/dL — ABNORMAL LOW (ref 6–23)
BUN: 9 mg/dL (ref 6–23)
Bilirubin Urine: NEGATIVE
Bilirubin Urine: NEGATIVE
Blood Glucose, Fingerstick: 153
CO2: 21 meq/L (ref 19–32)
CO2: 22 meq/L (ref 19–32)
Calcium: 9.6 mg/dL (ref 8.4–10.5)
Chloride: 95 meq/L — ABNORMAL LOW (ref 96–112)
Cholesterol: 126 mg/dL (ref 0–200)
Creatinine, Ser: 0.66 mg/dL (ref 0.40–1.50)
Eosinophils Absolute: 0.5 10*3/uL (ref 0.0–0.7)
Eosinophils Relative: 5 % (ref 0–5)
Glucose, Bld: 120 mg/dL — ABNORMAL HIGH (ref 70–99)
Glucose, Bld: 121 mg/dL — ABNORMAL HIGH (ref 70–99)
Glucose, Urine, Semiquant: NEGATIVE
HCT: 42.5 % (ref 39.0–52.0)
HDL: 44 mg/dL (ref >39)
Hemoglobin: 14.7 g/dL (ref 13.0–17.0)
Hgb A1c MFr Bld: 5.9 % — ABNORMAL HIGH (ref <5.7)
Lymphocytes Relative: 34 % (ref 12–46)
Lymphs Abs: 5.1 10*3/uL — ABNORMAL HIGH (ref 0.7–4.0)
MCHC: 32.9 g/dL (ref 30.0–36.0)
MCV: 101.2 fL — ABNORMAL HIGH (ref 78.0–100.0)
Monocytes Relative: 12 % (ref 3–12)
Neutro Abs: 8.6 10*3/uL — ABNORMAL HIGH (ref 1.7–7.7)
Neutrophils Relative %: 56 % (ref 43–77)
Nitrite: NEGATIVE
OCCULT 1: NEGATIVE
Platelets: 395 10*3/uL (ref 150–400)
Platelets: 433 10*3/uL — ABNORMAL HIGH (ref 150–400)
Potassium: 5 meq/L (ref 3.5–5.3)
Protein, U semiquant: NEGATIVE
Sodium: 127 meq/L — ABNORMAL LOW (ref 135–145)
Sodium: 130 meq/L — ABNORMAL LOW (ref 135–145)
Specific Gravity, Urine: 1.015
Specific Gravity, Urine: <1.005
Total Bilirubin: 0.7 mg/dL (ref 0.3–1.2)
Total Protein: 8.1 g/dL (ref 6.0–8.3)
Triglycerides: 198 mg/dL — ABNORMAL HIGH (ref <150)
Urobilinogen, UA: 0.2
VLDL: 40 mg/dL (ref 0–40)
WBC Urine, dipstick: NEGATIVE
WBC: 15.3 10*3/uL — ABNORMAL HIGH (ref 4.0–10.5)
pH: 6

## 2010-09-29 NOTE — Progress Notes (Signed)
Summary: Office Visit/DEPRESSION SCREENING  Office Visit/DEPRESSION SCREENING   Imported By: Arta Bruce 11/23/2009 15:23:32  _____________________________________________________________________  External Attachment:    Type:   Image     Comment:   External Document

## 2010-09-29 NOTE — Letter (Signed)
Summary: RADIOLOGY ORDER/FAXED  RADIOLOGY ORDER/FAXED   Imported By: Arta Bruce 03/08/2010 09:59:53  _____________________________________________________________________  External Attachment:    Type:   Image     Comment:   External Document

## 2010-09-29 NOTE — Letter (Signed)
Summary: *HSN Results Follow up  [facility]  [address] [place], [place] [address]   Phone: [phone]  Fax: [phone]      03/07/2010   TERRIAN SENTELL [address] [place], [place]  [address]   Dear  Mr. JOJO PEHL,                            ____S.Drinkard,FNP   ____D. Gore,FNP       ____B. McPherson,MD   ____V. Rankins,MD    ____E. Mulberry,MD    ____N. Daphine Deutscher, FNP  ____D. Reche Dixon, MD    ____K. Philipp Deputy, MD    __x__S. Alben Spittle, PA-C     This letter is to inform you that your recent test(s):  _______Pap Smear    ___x____Lab Test     _______X-ray    ___x____ is within acceptable limits  _______ requires a medication change  _______ requires a follow-up lab visit  _______ requires a follow-up visit with your provider   Comments:  Sodium is stable (no significant change).  Blood counts normal.  A1C (sugar test) is 5.9.  We want to keep it below 6.         _________________________________________________________ If you have any questions, please contact our office                     Sincerely,  [name] [facility]

## 2010-09-29 NOTE — Letter (Signed)
Summary: *HSN Results Follow up  [facility]  [address] [place], [place] [address]   Phone: [phone]  Fax: [phone]      10/05/2009   [name] [address] [place], [place]  [address]   Dear  [name],                            ____S.Drinkard,FNP   ____D. Gore,FNP       ____B. McPherson,MD   ____V. Rankins,MD    ____E. Mulberry,MD    ____N. Daphine Deutscher, FNP  ____D. Reche Dixon, MD    ____K. Philipp Deputy, MD    __x__S. Alben Spittle, PA-C    This letter is to inform you that your recent test(s):  _______Pap Smear    _______Lab Test     _______X-ray    _______ is within acceptable limits  _______ requires a medication change  _______ requires a follow-up lab visit  _______ requires a follow-up visit with your provider   Comments: Stool test negative for blood.       _________________________________________________________ If you have any questions, please contact our office                     Sincerely,  [name] [facility]
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
## 2010-09-29 NOTE — Progress Notes (Signed)
Summary: Grp D Strep UTI  ---- Converted from flag ---- ---- 06/06/2010 4:09 PM, Yisroel Ramming MD wrote: he can have it in his urine - not common it lives in the GI and if the patient is bacteremic then the concern is GI malignancy  ---- 06/03/2010 3:18 PM, [name] wrote: Dr. Philipp Deputy I wanted to ask you a question. This patient saw me recently with recurrent gross hematuria.  He had some mild dysuria assoc.  He has a h/o BPH which seems to be controlled with Flomax.  He had an initial episode in July.  His urine was + for UTI and he was treated with resolution.  He did have a CT that was negative and a negative urine cytology.  His urine this time is growing out Group D strep (Strep bovis).  He is PCN allergic.  I thought this was somewhat unusual for the urine.  I am going to give him zithromax (zpak).  What do you think about Grp D Strep in the urine?  Is this likely? Thank you, Scott ------------------------------   Not bacteremic. Repeat u/a due 2 weeks after tx. [name]  June 14, 2010 5:33 PM

## 2010-09-29 NOTE — Letter (Signed)
Summary: ECHO REPORT  ECHO REPORT   Imported By: Arta Bruce 12/21/2009 15:00:58  _____________________________________________________________________  External Attachment:    Type:   Image     Comment:   External Document

## 2010-09-29 NOTE — Progress Notes (Signed)
Summary: Refill Query Nitroglycerin  Phone Note From Pharmacy   Summary of Call: Pharmacy request refill for Nitoglycerin. Pt. last seen 05/31/10. Nito last filled 08/19/08 with no refills. Do you want to refill? [facility]. Initial call taken by: Gaylyn Cheers RN,  July 15, 2010 5:47 PM  Follow-up for Phone Call        rx sent electronically to [facility] notify pt Follow-up by: Lehman Prom FNP,  July 15, 2010 6:14 PM    New/Updated Medications: NITROGLYCERIN 0.4 MG SUBL (NITROGLYCERIN) One tablet subcutaneously at onset on chest pain, may repeat up to 3 times Prescriptions: NITROGLYCERIN 0.4 MG SUBL (NITROGLYCERIN) One tablet subcutaneously at onset on chest pain, may repeat up to 3 times  #25 x 0   Entered and Authorized by:   Lehman Prom FNP   Signed by:   Lehman Prom FNP on 07/15/2010   Method used:   Electronically to        [facility]* (retail)       [address] [place], [place]  [address]       Ph: [phone]       Fax: [phone]   RxID:   [id number]

## 2010-09-29 NOTE — Assessment & Plan Note (Signed)
Summary: 3 MONTH FU ON BP AND SALT//KT   Vital Signs:  Patient profile:   60 year old male Weight:      200 pounds Temp:     98.0 degrees F oral Pulse rate:   78 / minute Pulse rhythm:   regular Resp:     18 per minute BP sitting:   160 / 81  (left arm) Cuff size:   large  Vitals Entered By: [place] Shannon (March 04, 2010 11:39 AM)  Serial Vital Signs/Assessments:  Time      Position  BP       Pulse  Resp  Temp     By 1:07 PM             118/60                         [name]  CC: follow-up visit, blood pressure, , Hypertension Management Is Patient Diabetic? Yes CBG Result 153  Does patient need assistance? Functional Status Self care Ambulation Normal   Primary Care Provider:  [name]  CC:  follow-up visit, blood pressure, , and Hypertension Management.  History of Present Illness: Here for f/u. Taking flomax for BPH.  Urinating better with less pain.  Feels much better with this.  He developed gross hematuria about 10 days ago.  It was basically painless.  It lasted for about 4 days.  He stopped his ASA.  It cleared up a few days ago.  No hematuria now.  He is back on his ASA.  Never had this before.  No dysuria or frequency.  He did go without Flomax for a while due to his card running out.  He noted dysuria and decreased urine flow when he was out of this.  No flank pain or radiating pain into his groin.  No h/o kidney stones.    Hypertension History:      He complains of peripheral edema, but denies chest pain and dyspnea with exertion.        Positive major cardiovascular risk factors include male age 19 years old or older, hyperlipidemia, and hypertension.  Negative major cardiovascular risk factors include negative family history for ischemic heart disease and non-tobacco-user status.        Positive history for target organ damage include ASHD (either angina/prior MI/prior CABG).     Habits & Providers  Alcohol-Tobacco-Diet     Tobacco  Status: quit  Current Medications (verified): 1)  Nitroglycerin 0.4 Mg Subl (Nitroglycerin) .Marland Kitchen.. 1tslq33minprn Chest Pain 2)  Aspirin Ec 325 Mg Tbec (Aspirin) .Marland Kitchen.. 1 Tablet By Mouth Daily 3)  Toprol Xl 100 Mg Xr24h-Tab (Metoprolol Succinate) .Marland Kitchen.. 1 By Mouth Once Daily ** Dose Increase 4)  Fish Oil 1000 Mg Caps (Omega-3 Fatty Acids) .... 3 Tabs Daily 5)  Vitamin B-12 1000 Mcg Tabs (Cyanocobalamin) .... 2 By Mouth Once Daily 6)  Chlorpheniramine Maleate 4 Mg Tabs (Chlorpheniramine Maleate) .... Once Daily As Needed 7)  Multivitamins  Tabs (Multiple Vitamin) .... Once Daily 8)  Levaquin 750 Mg Tabs (Levofloxacin) .... Take 1 Tablet By Mouth Once A Day If You Develop A Fever of 101 Degrees or Higher (Patient Had Splenectomy) 9)  Lisinopril 20 Mg Tabs (Lisinopril) .... Take 1 Tablet By Mouth Once A Day 10)  Therapeutic Sodium .... Take 1 Tablet By Mouth Once A Day 11)  Pravastatin Sodium 20 Mg Tabs (Pravastatin Sodium) .... Take 1 Tab By Mouth At Bedtime  For Cholesterol 12)  Norvasc 5 Mg Tabs (Amlodipine Besylate) .... Take 1 Tablet By Mouth Once A Day For Blood Pressure 13)  Flomax 0.4 Mg Caps (Tamsulosin Hcl) .... Take 1 Tab By Mouth At Bedtime For Prostate  Allergies (verified): 1)  ! Penicillin V Potassium (Penicillin V Potassium)  Social History: Occupation:Professional photographer Single Alcohol use-yes..heavy Former Smoker   a.  20 pack years (quit 20 years ago) Smoking Status:  quit  Review of Systems CV:  Denies difficulty breathing at night, difficulty breathing while lying down, and fainting.  Physical Exam  General:  alert, well-developed, and well-nourished.   Head:  normocephalic and atraumatic.   Neck:  supple.   Lungs:  normal breath sounds, no crackles, and no wheezes.   Heart:  normal rate and regular rhythm.   Abdomen:  protuberent + hepatomegaly no masses  Extremities:  trace left pedal edema and trace right pedal edema.   Neurologic:  alert & oriented X3 and  cranial nerves II-XII intact.   Psych:  normally interactive.     Impression & Recommendations:  Problem # 1:  GROSS HEMATURIA (ICD-599.71) with Fhx of probable bladder CA, prior smoker, ongoing alcohol abuse will send for CT also get culture consider urine cytology if w/u negative and he notes more urine consider referral to urology  His updated medication list for this problem includes:    Levaquin 750 Mg Tabs (Levofloxacin) .Marland Kitchen... Take 1 tablet by mouth once a day if you develop a fever of 101 degrees or higher (patient had splenectomy)  Orders: T-Basic Metabolic Panel [phone]) T-CBC w/Diff [id number]) T- * Misc. Laboratory test [phone]) T-Urinalysis [phone]) T-Culture, Urine ([phone]) CT with & without Contrast (CT w&w/o Contrast)  Problem # 2:  HYPERTENSION (ICD-401.9) repeat by me normal change to metoprolol tartrate due to cost  His updated medication list for this problem includes:    Metoprolol Tartrate 50 Mg Tabs (Metoprolol tartrate) .Marland Kitchen... Take 1 tablet by mouth two times a day    Lisinopril 20 Mg Tabs (Lisinopril) .Marland Kitchen... Take 1 tablet by mouth once a day    Norvasc 5 Mg Tabs (Amlodipine besylate) .Marland Kitchen... Take 1 tablet by mouth once a day for blood pressure  Orders: T-Basic Metabolic Panel ([phone])  Problem # 3:  HYPERTROPHY PROSTATE W/UR OBST & OTH LUTS (ICD-600.01) symptoms improved on Flomax continue for now  Problem # 4:  GLUCOSE INTOLERANCE (ICD-271.3) f/u A1C today  Orders: Capillary Blood Glucose/CBG ([id number]) T- Hemoglobin A1C ([id number])  Problem # 5:  PREVENTIVE HEALTH CARE (ICD-V70.0) discuss colo later after w/u hematuria  Problem # 6:  HYPONATREMIA, HX OF (ICD-V12.3) f/u labs  Orders: T-Basic Metabolic Panel [phone])  Problem # 7:  ABUSE, ALCOHOL, UNSPECIFIED (ICD-305.00) have discussed cessation with him in past  Problem # 8:  CORONARY ARTERY DISEASE (ICD-414.00) stable without angina  His updated medication  list for this problem includes:    Nitroglycerin 0.4 Mg Subl (Nitroglycerin) .Marland Kitchen... 1tslq57minprn chest pain    Aspirin Ec 325 Mg Tbec (Aspirin) .Marland Kitchen... 1 tablet by mouth daily    Metoprolol Tartrate 50 Mg Tabs (Metoprolol tartrate) .Marland Kitchen... Take 1 tablet by mouth two times a day    Lisinopril 20 Mg Tabs (Lisinopril) .Marland Kitchen... Take 1 tablet by mouth once a day    Norvasc 5 Mg Tabs (Amlodipine besylate) .Marland Kitchen... Take 1 tablet by mouth once a day for blood pressure  Complete Medication List: 1)  Nitroglycerin 0.4 Mg Subl (Nitroglycerin) .Marland Kitchen.. 1tslq46minprn chest pain 2)  Aspirin Ec  325 Mg Tbec (Aspirin) .Marland Kitchen.. 1 tablet by mouth daily 3)  Metoprolol Tartrate 50 Mg Tabs (Metoprolol tartrate) .... Take 1 tablet by mouth two times a day 4)  Fish Oil 1000 Mg Caps (Omega-3 fatty acids) .... 3 tabs daily 5)  Vitamin B-12 1000 Mcg Tabs (Cyanocobalamin) .... 2 by mouth once daily 6)  Chlorpheniramine Maleate 4 Mg Tabs (Chlorpheniramine maleate) .... Once daily as needed 7)  Multivitamins Tabs (Multiple vitamin) .... Once daily 8)  Levaquin 750 Mg Tabs (Levofloxacin) .... Take 1 tablet by mouth once a day if you develop a fever of 101 degrees or higher (patient had splenectomy) 9)  Lisinopril 20 Mg Tabs (Lisinopril) .... Take 1 tablet by mouth once a day 10)  Therapeutic Sodium  .... Take 1 tablet by mouth once a day 11)  Pravastatin Sodium 20 Mg Tabs (Pravastatin sodium) .... Take 1 tab by mouth at bedtime for cholesterol 12)  Norvasc 5 Mg Tabs (Amlodipine besylate) .... Take 1 tablet by mouth once a day for blood pressure 13)  Flomax 0.4 Mg Caps (Tamsulosin hcl) .... Take 1 tab by mouth at bedtime for prostate  Hypertension Assessment/Plan:      The patient's hypertensive risk group is category C: Target organ damage and/or diabetes.  Today's blood pressure is 160/81.  His blood pressure goal is < 140/90.  Patient Instructions: 1)  Please schedule a follow-up appointment in 1 month.  2)  Return sooner if you see more  blood in your urine. Prescriptions: METOPROLOL TARTRATE 50 MG TABS (METOPROLOL TARTRATE) Take 1 tablet by mouth two times a day  #60 x 11   Entered and Authorized by:   [name]   Signed by:   [name] on 03/04/2010   Method used:   Print then Give to Patient   RxID:   [id number]   Laboratory Results   Urine Tests  Date/Time Received: March 04, 2010 2:50 PM    Routine Urinalysis   Color: yellow Appearance: Hazy Glucose: negative   (Normal Range: Negative) Bilirubin: negative   (Normal Range: Negative) Ketone: negative   (Normal Range: Negative) Spec. Gravity: <1.005   (Normal Range: 1.003-1.035) Blood: trace-lysed   (Normal Range: Negative) pH: 5.5   (Normal Range: 5.0-8.0) Protein: negative   (Normal Range: Negative) Urobilinogen: 0.2   (Normal Range: 0-1) Nitrite: negative   (Normal Range: Negative) Leukocyte Esterace: moderate   (Normal Range: Negative)     Blood Tests     CBG Random:: 153mg /dL
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
## 2010-09-29 NOTE — Letter (Signed)
Summary: *HSN Results Follow up  [facility]  [address] [place], [place] [address]   Phone: [phone]  Fax: [phone]      04/06/2010   REEDER BRISBY [address] [place], [place]  [address]   Dear  Mr. LABRANDON KNOCH,                            ____S.Drinkard,FNP   ____D. Gore,FNP       ____B. McPherson,MD   ____V. Rankins,MD    ____E. Mulberry,MD    ____N. Daphine Deutscher, FNP  ____D. Reche Dixon, MD    ____K. Philipp Deputy, MD    ____Other     This letter is to inform you that your recent test(s):  _______Pap Smear    ___X____Lab Test     _______X-ray    ___X____ is within acceptable limits  _______ requires a medication change  _______ requires a follow-up lab visit  _______ requires a follow-up visit with your provider   Comments: We will repeat your urine cytology every six months.       _________________________________________________________ If you have any questions, please contact our office                     Sincerely,  [place] Shannon [facility]

## 2010-09-29 NOTE — Progress Notes (Signed)
   Phone Note Outgoing Call   Summary of Call: I have not gotten the urine tests back on him yet. Have they been done? Initial call taken by: [name],  October 11, 2009 1:39 PM  Follow-up for Phone Call        this pt was to come back on 10/05/2009 and he never came back to do the urine test.   Follow-up by: Levon Hedger,  October 11, 2009 2:51 PM  Additional Follow-up for Phone Call Additional follow up Details #1::        spoke with pt and he is aware Additional Follow-up by: [place] Shannon,  October 11, 2009 3:42 PM

## 2010-09-29 NOTE — Progress Notes (Signed)
Summary: Urology referral  Phone Note Outgoing Call   Summary of Call: Please make sure patient is getting referred to urology. Initial call taken by: Brynda Rim,  June 14, 2010 5:35 PM  Follow-up for Phone Call        I send the referral to [facility] waiting for an appt  Follow-up by: Cheryll Dessert,  June 15, 2010 4:11 PM

## 2010-09-29 NOTE — Progress Notes (Signed)
   Phone Note Outgoing Call   Summary of Call: Do we have his HIV results? Initial call taken by: [name],  September 30, 2009 2:13 PM  Follow-up for Phone Call        no will get on 10-05-08 when pt comes in for lab Follow-up by: [place] Shannon,  October 04, 2009 9:48 AM

## 2010-09-29 NOTE — Letter (Signed)
Summary: *HSN Results Follow up  [facility]  [address] [place], [place] [address]   Phone: [phone]  Fax: [phone]      06/06/2010   TEL HEVIA [address] [place], [place]  [address]   Dear  Mr. SHELLY SHOULTZ,                            ____S.Drinkard,FNP   ____D. Gore,FNP       ____B. McPherson,MD   ____V. Rankins,MD    ____E. Mulberry,MD    ____N. Daphine Deutscher, FNP  ____D. Reche Dixon, MD    ____K. Philipp Deputy, MD    ____Other     This letter is to inform you that your recent test(s):  _______Pap Smear    ____X___Lab Test     _______X-ray    ____X___ is within acceptable limits  _______ requires a medication change  ____X___ requires a follow-up lab visit  _______ requires a follow-up visit with your provider   Comments:  We would like for you to stay on your sodium pills and return for two weeks for blood work.       _________________________________________________________ If you have any questions, please contact our office                     Sincerely,  [place] Shannon [facility]

## 2010-09-29 NOTE — Assessment & Plan Note (Signed)
Summary: 1 MONTH FU ON BP//KT   Vital Signs:  Patient profile:   60 year old male Height:      64.5 inches Weight:      208 pounds BMI:     35.28 Temp:     98.3 degrees F oral Pulse rate:   74 / minute Pulse rhythm:   regular Resp:     18 per minute BP sitting:   93 / 65  (right arm) Cuff size:   large  Vitals Entered By: Geanie Cooley  (November 17, 2009 11:16 AM)  Serial Vital Signs/Assessments:  Time      Position  BP       Pulse  Resp  Temp     By 12:13 PM            120/68                         [name]  CC: f/u on bp..., Hypertension Management Is Patient Diabetic? No Pain Assessment Patient in pain? no       Does patient need assistance? Functional Status Self care Ambulation Normal   CC:  f/u on bp... and Hypertension Management.  History of Present Illness: Here for f/u.  HTN:  Blood pressure much better.   Feels good.  NO complaints.  Hyponatremia: Needs repeat labs today.  ETOH:  Has cut back considerably.  Does not know how much he drinks.  Says he is not drinking as much.  BPH:  Urine stream better with flomax.  No significant nocturia.  Seborrheic dermatitis:  Changes soaps and skin has cleared up for the most part.      Hypertension History:      He denies chest pain, dyspnea with exertion, orthopnea, PND, peripheral edema, neurologic problems, and syncope.        Positive major cardiovascular risk factors include male age 71 years old or older, hyperlipidemia, and hypertension.  Negative major cardiovascular risk factors include negative family history for ischemic heart disease and non-tobacco-user status.        Positive history for target organ damage include ASHD (either angina/prior MI/prior CABG).     Current Medications (verified): 1)  Nitroglycerin 0.4 Mg Subl (Nitroglycerin) .Marland Kitchen.. 1tslq50minprn Chest Pain 2)  Aspirin Ec 325 Mg Tbec (Aspirin) .Marland Kitchen.. 1 Tablet By Mouth Daily 3)  Toprol Xl 100 Mg Xr24h-Tab (Metoprolol Succinate)  .Marland Kitchen.. 1 By Mouth Once Daily ** Dose Increase 4)  Fish Oil 1000 Mg Caps (Omega-3 Fatty Acids) .... 3 Tabs Daily 5)  Vitamin B-12 1000 Mcg Tabs (Cyanocobalamin) .... 2 By Mouth Once Daily 6)  Chlorpheniramine Maleate 4 Mg Tabs (Chlorpheniramine Maleate) .... Once Daily As Needed 7)  Multivitamins  Tabs (Multiple Vitamin) .... Once Daily 8)  Levaquin 750 Mg Tabs (Levofloxacin) .... Take 1 Tablet By Mouth Once A Day If You Develop A Fever of 101 Degrees or Higher (Patient Had Splenectomy) 9)  Lisinopril 20 Mg Tabs (Lisinopril) .... Take 1 Tablet By Mouth Once A Day 10)  Therapeutic Sodium .... Take 1 Tablet By Mouth Once A Day 11)  Pravastatin Sodium 20 Mg Tabs (Pravastatin Sodium) .... Take 1 Tab By Mouth At Bedtime For Cholesterol 12)  Norvasc 5 Mg Tabs (Amlodipine Besylate) .... Take 1 Tablet By Mouth Once A Day For Blood Pressure 13)  Flomax 0.4 Mg Caps (Tamsulosin Hcl) .... Take 1 Tab By Mouth At Bedtime For Prostate  Allergies (verified): 1)  ! Penicillin  V Potassium (Penicillin V Potassium)  Physical Exam  General:  alert, well-developed, and well-nourished.   Head:  normocephalic and atraumatic.   Neck:  supple.   Lungs:  normal breath sounds.   Heart:  normal rate and regular rhythm.   Abdomen:  soft and non-tender.   Extremities:  no edema  Skin:  turgor normal and no rashes.   Psych:  normally interactive and good eye contact.     Impression & Recommendations:  Problem # 1:  HYPERTENSION (ICD-401.9)  much better controlled cont current meds  His updated medication list for this problem includes:    Toprol Xl 100 Mg Xr24h-tab (Metoprolol succinate) .Marland Kitchen... 1 by mouth once daily ** dose increase    Lisinopril 20 Mg Tabs (Lisinopril) .Marland Kitchen... Take 1 tablet by mouth once a day    Norvasc 5 Mg Tabs (Amlodipine besylate) .Marland Kitchen... Take 1 tablet by mouth once a day for blood pressure  Orders: T-Comprehensive Metabolic Panel ([id number])  Problem # 2:  HYPONATREMIA, HX OF  (ICD-V12.3)  recheck labs today  Orders: T-Comprehensive Metabolic Panel [id number]) T-Urine, Sodium ([id number]) T-Blood Osmolality ([id number]) T-Urine Osmolality ([id number])  Problem # 3:  LEUKOCYTOSIS (ICD-288.60) prob from asplenia repeat today  Orders: T-CBC w/Diff ([id number])  Problem # 4:  HYPERTROPHY PROSTATE W/UR OBST & OTH LUTS (ICD-600.01) improved with flomax  Problem # 5:  PREVENTIVE HEALTH CARE (ICD-V70.0) go ahead and send referral to GI for colo  Problem # 6:  SEBORRHEIC ECZEMA (ICD-690.18) improved hold off on referral to derm clinic  Problem # 7:  GLUCOSE INTOLERANCE (ICD-271.3) saw nutritionist repeat A1C at next visit  Problem # 8:  ABUSE, ALCOHOL, UNSPECIFIED (ICD-305.00) cutting back  Problem # 9:  CORONARY ARTERY DISEASE (ICD-414.00) stable without angina  His updated medication list for this problem includes:    Nitroglycerin 0.4 Mg Subl (Nitroglycerin) .Marland Kitchen... 1tslq21minprn chest pain    Aspirin Ec 325 Mg Tbec (Aspirin) .Marland Kitchen... 1 tablet by mouth daily    Toprol Xl 100 Mg Xr24h-tab (Metoprolol succinate) .Marland Kitchen... 1 by mouth once daily ** dose increase    Lisinopril 20 Mg Tabs (Lisinopril) .Marland Kitchen... Take 1 tablet by mouth once a day    Norvasc 5 Mg Tabs (Amlodipine besylate) .Marland Kitchen... Take 1 tablet by mouth once a day for blood pressure  Complete Medication List: 1)  Nitroglycerin 0.4 Mg Subl (Nitroglycerin) .Marland Kitchen.. 1tslq75minprn chest pain 2)  Aspirin Ec 325 Mg Tbec (Aspirin) .Marland Kitchen.. 1 tablet by mouth daily 3)  Toprol Xl 100 Mg Xr24h-tab (Metoprolol succinate) .Marland Kitchen.. 1 by mouth once daily ** dose increase 4)  Fish Oil 1000 Mg Caps (Omega-3 fatty acids) .... 3 tabs daily 5)  Vitamin B-12 1000 Mcg Tabs (Cyanocobalamin) .... 2 by mouth once daily 6)  Chlorpheniramine Maleate 4 Mg Tabs (Chlorpheniramine maleate) .... Once daily as needed 7)  Multivitamins Tabs (Multiple vitamin) .... Once daily 8)  Levaquin 750 Mg Tabs (Levofloxacin) .... Take 1 tablet by mouth  once a day if you develop a fever of 101 degrees or higher (patient had splenectomy) 9)  Lisinopril 20 Mg Tabs (Lisinopril) .... Take 1 tablet by mouth once a day 10)  Therapeutic Sodium  .... Take 1 tablet by mouth once a day 11)  Pravastatin Sodium 20 Mg Tabs (Pravastatin sodium) .... Take 1 tab by mouth at bedtime for cholesterol 12)  Norvasc 5 Mg Tabs (Amlodipine besylate) .... Take 1 tablet by mouth once a day for blood pressure 13)  Flomax 0.4 Mg Caps (Tamsulosin hcl) .Marland KitchenMarland KitchenMarland Kitchen  Take 1 tab by mouth at bedtime for prostate  Hypertension Assessment/Plan:      The patient's hypertensive risk group is category C: Target organ damage and/or diabetes.  Today's blood pressure is 93/65.  His blood pressure goal is < 140/90.  Patient Instructions: 1)  Please schedule a follow-up appointment in 3 months with Scott for blood pressure and sugar.   Appended Document: 1 MONTH FU ON BP//KT Phone Note Outgoing Call   Summary of Call: CBC with slightly low hgb and high WBC. Iron levels ok.  B12 and folate ok. Need to make sure he is getting into to see GI for screening colo. I will recheck his CBC in June when he returns for follow up. Initial call taken by: Brynda Rim,  December 13, 2009 4:37 PM  Follow-up for Phone Call        spoke with pt and he said he don't want to do the colonoscopy Follow-up by: [place] Shannon,  December 14, 2009 12:10 PM  Additional Follow-up for Phone Call Additional follow up Details #1::        I will d/w Conley Canal at his f/u OV Additional Follow-up by: [name],  December 14, 2009 1:36 PM       Signed by [name] on 12/14/2009 at 1:36 PM

## 2010-09-29 NOTE — Letter (Signed)
Summary: NUTRITIONIST//SUSIE  NUTRITIONIST//SUSIE   Imported By: Arta Bruce 12/09/2009 15:55:43  _____________________________________________________________________  External Attachment:    Type:   Image     Comment:   External Document

## 2010-09-29 NOTE — Progress Notes (Signed)
Summary: Follow up on CBC from 11/17/2009 - d/w patient at f/u visit   Phone Note Outgoing Call   Summary of Call: CBC with slightly low hgb and high WBC. Iron levels ok.  B12 and folate ok. Need to make sure he is getting into to see GI for screening colo. I will recheck his CBC in June when he returns for follow up. Initial call taken by: Brynda Rim,  December 13, 2009 4:37 PM  Follow-up for Phone Call        spoke with pt and he said he don't want to do the colonoscopy Follow-up by: [place] Shannon,  December 14, 2009 12:10 PM  Additional Follow-up for Phone Call Additional follow up Details #1::        I will d/w Conley Canal at his f/u OV Additional Follow-up by: [name],  December 14, 2009 1:36 PM

## 2010-09-29 NOTE — Assessment & Plan Note (Signed)
Summary: Hematuria   Vital Signs:  Patient profile:   60 year old male Height:      64.5 inches Weight:      200.9 pounds BMI:     34.07 Temp:     97.9 degrees F oral Pulse rate:   76 / minute Pulse rhythm:   regular Resp:     20 per minute BP sitting:   130 / 72  (left arm) Cuff size:   large  Vitals Entered By: [place] Shannon (May 31, 2010 3:06 PM) CC: blood in urine x a week now.... pt says he urine was lim green and orange...Marland KitchenMarland Kitchen pt says he saw blood today with tissue   Primary Care Latoi Giraldo:  [name]  CC:  blood in urine x a week now.... pt says he urine was lim green and orange...Marland KitchenMarland Kitchen pt says he saw blood today with tissue.  History of Present Illness: Here for hematuria.  Noticed change in urine color starting a week ago.  Notes pain at meatus when he starts to urinate.  He states the color changed to a lime green at first, then orange, then red.  He saw something that looked like tissue mixed in his urine at one point.  No fevers.  No chills.  No flank pain.  No groin pain.  He has felt weak.  No syncope.  He had some diarrhea a few weeks ago.  This has resolved.  Still notes improvement in urine stream with flomax.  Has not missed any flomax.  Denies much nocturia.  Still drinking alcohol.  Has had about a 6 pack over the last couple days.  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 1tslq57minprn Chest Pain 2)  Aspirin Ec 325 Mg Tbec (Aspirin) .Marland Kitchen.. 1 Tablet By Mouth Daily 3)  Metoprolol Tartrate 50 Mg Tabs (Metoprolol Tartrate) .... Take 1 Tablet By Mouth Two Times A Day 4)  Fish Oil 1000 Mg Caps (Omega-3 Fatty Acids) .... 3 Tabs Daily 5)  Vitamin B-12 1000 Mcg Tabs (Cyanocobalamin) .... 2 By Mouth Once Daily 6)  Chlorpheniramine Maleate 4 Mg Tabs (Chlorpheniramine Maleate) .... Once Daily As Needed 7)  Multivitamins  Tabs (Multiple Vitamin) .... Once Daily 8)  Levaquin 750 Mg Tabs (Levofloxacin) .... Take 1 Tablet By Mouth Once A Day If You Develop A Fever of 101 Degrees or Higher (Patient Had Splenectomy) 9)  Lisinopril 20 Mg Tabs (Lisinopril) .... Take 2 Tabs By Mouth Once Daily 10)  Therapeutic Sodium .... Take 1 Tablet By Mouth Once A Day 11)  Pravastatin Sodium 20 Mg Tabs (Pravastatin Sodium) .... Take 1 Tab By Mouth At Bedtime For Cholesterol 12)  Norvasc 5 Mg Tabs (Amlodipine Besylate) .Marland KitchenMarland KitchenMarland Kitchen  Take 1 Tablet By Mouth Once A Day For Blood Pressure 13)  Flomax 0.4 Mg Caps (Tamsulosin Hcl) .... Take 1 Tab By Mouth At Bedtime For Prostate 14)  Zithromax Z-Pak 250 Mg Tabs (Azithromycin) .... Take 2 Tabs On Day 1 and 1 Tab On Day 2, 3, 4 and 5  Allergies (verified): 1)  ! Penicillin V Potassium (Penicillin V Potassium)  Past History:  Past Medical History: Coronary artery disease; s/p cath:see report   a.  cath 7.22.1999:  EF 65%; D2 50-70% at origin; D1 50% at origin; CFX mid 80%; OM 50% at origin;          RCA occluded at mid portion; collaterals from LAD to PDA   b.  CABG recommended but deferred at that time; patient without insurance and stable angina and          no critical LAD disease   c.  followed by Dr. Amil Amen; not seen since  2007   d.  Echo 11/2009:  Normal LVF; EF 55-60%; mild LVH Hypertension alcoholic Former smoker;quit 1989. Allergic rhinitis Hyperlipidemia Hyponatremia Hematuria   a.  CT of abd/pelvis in July 2011 unremarkable; urine cytology neg x 1; tx for UTI and flomax restarted with resolution  Social History: Reviewed history from 03/04/2010 and no changes required. Occupation:Professional photographer Single Alcohol use-yes..heavy Former Smoker   a.  20 pack years (quit 20 years ago)  Physical Exam  General:  alert, well-developed, and well-nourished.   Head:  normocephalic and atraumatic.   Lungs:  normal breath sounds.   Heart:  normal rate and regular rhythm.   Abdomen:  soft, non-tender, and no hepatomegaly.   Genitalia:  uncircumcised, no hydrocele, no varicocele, no scrotal masses, no testicular masses or atrophy, no cutaneous lesions, and no urethral discharge.   Neurologic:  alert & oriented X3 and cranial nerves II-XII intact.   Psych:  normally interactive.     Impression & Recommendations:  Problem # 1:  GROSS HEMATURIA (ICD-599.71)  recurrent episode will check u/a for +/- infection and tx if + he needs to go to urology for w/u will make referral check cbc as he has been weak  His updated medication list for this problem includes:    Levaquin 750 Mg Tabs (Levofloxacin) .Marland Kitchen... Take 1 tablet by mouth once a day if you develop a fever of 101 degrees or higher (patient had splenectomy)    Zithromax Z-pak 250 Mg Tabs (Azithromycin) .Marland Kitchen... Take 2 tabs on day 1 and 1 tab on day 2, 3, 4 and 5  Orders: T-CBC w/Diff ([id number]) T-Urinalysis ([id number]) Urology Referral (Urology) T-Culture, Urine [phone]) T- * Misc. Laboratory test [id number])  Problem # 2:  HYPONATREMIA, HX OF (ICD-V12.3)  f/u bmet today  Orders: T-Basic Metabolic Panel [phone])  Complete Medication List: 1)  Nitroglycerin 0.4 Mg Subl (Nitroglycerin) .Marland Kitchen.. 1tslq50minprn chest pain 2)   Aspirin Ec 325 Mg Tbec (Aspirin) .Marland Kitchen.. 1 tablet by mouth daily 3)  Metoprolol Tartrate 50 Mg Tabs (Metoprolol tartrate) .... Take 1 tablet by mouth two times a day 4)  Fish Oil 1000 Mg Caps (Omega-3 fatty acids) .... 3 tabs daily 5)  Vitamin B-12 1000 Mcg Tabs (Cyanocobalamin) .... 2 by mouth once daily 6)  Chlorpheniramine Maleate 4 Mg Tabs (Chlorpheniramine maleate) .... Once daily as needed 7)  Multivitamins Tabs (Multiple vitamin) .... Once daily 8)  Levaquin 750 Mg Tabs (Levofloxacin) .... Take 1 tablet by mouth once a day if you develop a fever of 101 degrees  or higher (patient had splenectomy) 9)  Lisinopril 20 Mg Tabs (Lisinopril) .... Take 2 tabs by mouth once daily 10)  Therapeutic Sodium  .... Take 1 tablet by mouth once a day 11)  Pravastatin Sodium 20 Mg Tabs (Pravastatin sodium) .... Take 1 tab by mouth at bedtime for cholesterol 12)  Norvasc 5 Mg Tabs (Amlodipine besylate) .... Take 1 tablet by mouth once a day for blood pressure 13)  Flomax 0.4 Mg Caps (Tamsulosin hcl) .... Take 1 tab by mouth at bedtime for prostate 14)  Zithromax Z-pak 250 Mg Tabs (Azithromycin) .... Take 2 tabs on day 1 and 1 tab on day 2, 3, 4 and 5  Patient Instructions: 1)  Someone will call you to arrange an appt with urology.  Laboratory Results   Urine Tests    Routine Urinalysis   Color: yellow Glucose: negative   (Normal Range: Negative) Bilirubin: negative   (Normal Range: Negative) Ketone: negative   (Normal Range: Negative) Spec. Gravity: 1.010   (Normal Range: 1.003-1.035) Blood: large   (Normal Range: Negative) pH: 5.5   (Normal Range: 5.0-8.0) Protein: 100   (Normal Range: Negative) Urobilinogen: 0.2   (Normal Range: 0-1) Nitrite: negative   (Normal Range: Negative) Leukocyte Esterace: large   (Normal Range: Negative)

## 2010-09-29 NOTE — Assessment & Plan Note (Signed)
Summary: BP CHECK/ BMET/ DX 401.1/ 782.3//GK   per [facility] says the bp is great.... Billy Stone  April 20, 2010 3:09 PM   Nurse Visit   Allergies: 1)  ! Penicillin V Potassium (Penicillin V Potassium)  Orders Added: 1)  Est. Patient Level I [[address]109] 2)  T-Basic Metabolic Panel [[id number]]

## 2010-09-29 NOTE — Letter (Signed)
Summary: ECHO  ECHO   Imported By: Arta Bruce 12/14/2009 10:37:47  _____________________________________________________________________  External Attachment:    Type:   Image     Comment:   External Document

## 2010-09-29 NOTE — Progress Notes (Signed)
Summary: Need Labs Repeated at 3.3.2011 office visit   Phone Note Outgoing Call   Summary of Call: Labs are not adding up to explain his low sodium.  Repeat labs at f/u:  BMET, serum osmolality, urine osmolality, urine sodium. Initial call taken by: Brynda Rim,  October 21, 2009 1:22 PM  Follow-up for Phone Call        ok will do Follow-up by: [place] Shannon,  October 21, 2009 3:29 PM

## 2010-09-29 NOTE — Letter (Signed)
Summary: *HSN Results Follow up  [facility]  [address] [place], [place] [address]   Phone: [phone]  Fax: [phone]      12/13/2009   Billy Stone [address] [place], [place]  [address]   Dear  [name],                            ____S.Drinkard,FNP   ____D. Gore,FNP       ____B. McPherson,MD   ____V. Rankins,MD    ____E. Mulberry,MD    ____N. Daphine Deutscher, FNP  ____D. Reche Dixon, MD    ____K. Philipp Deputy, MD    __x__S. Alben Spittle, PA-C     This letter is to inform you that your recent test(s):  _______Pap Smear    _______Lab Test     ___x____X-ray    ___x____ is within acceptable limits  _______ requires a medication change  _______ requires a follow-up lab visit  _______ requires a follow-up visit with your provider   Comments:  Your heart function is normal on your echocardiogram.  Your chest xray was normal.         _________________________________________________________ If you have any questions, please contact our office                     Sincerely,  [name] [facility]

## 2010-09-29 NOTE — Letter (Signed)
Summary: REFERRAL /RADIOLOGY  REFERRAL /RADIOLOGY   Imported By: Arta Bruce 03/07/2010 11:49:04  _____________________________________________________________________  External Attachment:    Type:   Image     Comment:   External Document

## 2010-10-07 ENCOUNTER — Encounter (INDEPENDENT_AMBULATORY_CARE_PROVIDER_SITE_OTHER): Payer: Self-pay | Admitting: Internal Medicine

## 2010-10-07 ENCOUNTER — Encounter: Payer: Self-pay | Admitting: Internal Medicine

## 2010-10-07 DIAGNOSIS — D539 Nutritional anemia, unspecified: Secondary | ICD-10-CM | POA: Insufficient documentation

## 2010-10-12 ENCOUNTER — Telehealth (INDEPENDENT_AMBULATORY_CARE_PROVIDER_SITE_OTHER): Payer: Self-pay | Admitting: Internal Medicine
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
## 2010-11-08 NOTE — Progress Notes (Signed)
Summary: Cabin crew of Call: Please call and let pt. know I checked on the immunizations for people with hx of splenectomy:  He should get an injection of Menomune (for meningococcus) and repeat that every 5 years.  Needs to call the PHD and get set up for that. Some people recommend getting immunized for H. influenzae, but that is not felt to be as important. Would repeat his Pneumovax in 5 years, and then he should be good with that. Please spell the vaccines out to him so he can write down and take to PHD Initial call taken by: Julieanne Manson MD,  October 12, 2010 12:43 PM  Follow-up for Phone Call        Pt. advised per provider's recommendations -- information spelled out and phone # to [facility] given.  Pt. verbalized understanding and agreement.  Dutch Quint RN  October 31, 2010 9:13 AM

## 2011-04-21 ENCOUNTER — Emergency Department (HOSPITAL_COMMUNITY)
Admission: EM | Admit: 2011-04-21 | Discharge: 2011-04-21 | Disposition: A | Discharge status: Home / Self Care | Payer: Self-pay | Attending: Emergency Medicine | Admitting: Emergency Medicine

## 2011-04-21 DIAGNOSIS — I10 Essential (primary) hypertension: Secondary | ICD-10-CM | POA: Insufficient documentation

## 2011-04-21 DIAGNOSIS — E871 Hypo-osmolality and hyponatremia: Secondary | ICD-10-CM | POA: Insufficient documentation

## 2011-04-21 DIAGNOSIS — F1010 Alcohol abuse, uncomplicated: Secondary | ICD-10-CM | POA: Insufficient documentation

## 2011-04-21 LAB — BASIC METABOLIC PANEL
BUN: 5 mg/dL — ABNORMAL LOW (ref 6–23)
CO2: 22 mEq/L (ref 19–32)
Chloride: 87 mEq/L — ABNORMAL LOW (ref 96–112)
GFR calc Af Amer: >60 mL/min (ref >60)

## 2011-04-21 LAB — DIFFERENTIAL
Eosinophils Absolute: 0.1 10*3/uL (ref 0.0–0.7)
Lymphocytes Relative: 48 % — ABNORMAL HIGH (ref 12–46)
Monocytes Absolute: 1.1 10*3/uL — ABNORMAL HIGH (ref 0.1–1.0)

## 2011-04-21 LAB — CBC
Hemoglobin: 12.1 g/dL — ABNORMAL LOW (ref 13.0–17.0)
MCHC: 35.2 g/dL (ref 30.0–36.0)
Platelets: 312 10*3/uL (ref 150–400)
RBC: 3.59 MIL/uL — ABNORMAL LOW (ref 4.22–5.81)
RDW: 14.3 % (ref 11.5–15.5)
WBC: 8.8 10*3/uL (ref 4.0–10.5)

## 2011-04-21 LAB — HEPATIC FUNCTION PANEL
ALT: 26 U/L (ref 0–53)
Alkaline Phosphatase: 96 U/L (ref 39–117)
Bilirubin, Direct: 0.2 mg/dL (ref 0.0–0.3)
Indirect Bilirubin: 0.5 mg/dL (ref 0.3–0.9)
Total Bilirubin: 0.7 mg/dL (ref 0.3–1.2)

## 2011-08-15 ENCOUNTER — Ambulatory Visit: Payer: Self-pay

## 2011-08-15 DIAGNOSIS — Z23 Encounter for immunization: Secondary | ICD-10-CM

## 2012-01-10 DIAGNOSIS — R339 Retention of urine, unspecified: Secondary | ICD-10-CM

## 2012-01-10 HISTORY — DX: Retention of urine, unspecified: R33.9

## 2014-04-22 ENCOUNTER — Emergency Department (HOSPITAL_COMMUNITY): Payer: Medicare Other

## 2014-04-22 ENCOUNTER — Inpatient Hospital Stay (HOSPITAL_COMMUNITY)
Admission: EM | Admit: 2014-04-22 | Discharge: 2014-04-26 | DRG: 184 | Disposition: A | Discharge status: Home / Self Care | Payer: Medicare Other | Attending: Internal Medicine | Admitting: Internal Medicine

## 2014-04-22 ENCOUNTER — Encounter (HOSPITAL_COMMUNITY): Payer: Self-pay | Admitting: Emergency Medicine

## 2014-04-22 DIAGNOSIS — L219 Seborrheic dermatitis, unspecified: Secondary | ICD-10-CM

## 2014-04-22 DIAGNOSIS — S2239XA Fracture of one rib, unspecified side, initial encounter for closed fracture: Secondary | ICD-10-CM | POA: Diagnosis present

## 2014-04-22 DIAGNOSIS — Z862 Personal history of diseases of the blood and blood-forming organs and certain disorders involving the immune mechanism: Secondary | ICD-10-CM

## 2014-04-22 DIAGNOSIS — L719 Rosacea, unspecified: Secondary | ICD-10-CM

## 2014-04-22 DIAGNOSIS — R1011 Right upper quadrant pain: Secondary | ICD-10-CM | POA: Diagnosis not present

## 2014-04-22 DIAGNOSIS — R310 Gross hematuria: Secondary | ICD-10-CM

## 2014-04-22 DIAGNOSIS — R609 Edema, unspecified: Secondary | ICD-10-CM

## 2014-04-22 DIAGNOSIS — B9689 Other specified bacterial agents as the cause of diseases classified elsewhere: Secondary | ICD-10-CM | POA: Diagnosis present

## 2014-04-22 DIAGNOSIS — J309 Allergic rhinitis, unspecified: Secondary | ICD-10-CM

## 2014-04-22 DIAGNOSIS — S3981XA Other specified injuries of abdomen, initial encounter: Secondary | ICD-10-CM | POA: Diagnosis not present

## 2014-04-22 DIAGNOSIS — E785 Hyperlipidemia, unspecified: Secondary | ICD-10-CM

## 2014-04-22 DIAGNOSIS — I10 Essential (primary) hypertension: Secondary | ICD-10-CM | POA: Diagnosis present

## 2014-04-22 DIAGNOSIS — D539 Nutritional anemia, unspecified: Secondary | ICD-10-CM

## 2014-04-22 DIAGNOSIS — S2249XA Multiple fractures of ribs, unspecified side, initial encounter for closed fracture: Secondary | ICD-10-CM | POA: Diagnosis not present

## 2014-04-22 DIAGNOSIS — J209 Acute bronchitis, unspecified: Secondary | ICD-10-CM | POA: Diagnosis present

## 2014-04-22 DIAGNOSIS — Y92009 Unspecified place in unspecified non-institutional (private) residence as the place of occurrence of the external cause: Secondary | ICD-10-CM

## 2014-04-22 DIAGNOSIS — F1021 Alcohol dependence, in remission: Secondary | ICD-10-CM | POA: Diagnosis present

## 2014-04-22 DIAGNOSIS — S2231XA Fracture of one rib, right side, initial encounter for closed fracture: Secondary | ICD-10-CM

## 2014-04-22 DIAGNOSIS — N401 Enlarged prostate with lower urinary tract symptoms: Secondary | ICD-10-CM

## 2014-04-22 DIAGNOSIS — J440 Chronic obstructive pulmonary disease with acute lower respiratory infection: Secondary | ICD-10-CM | POA: Diagnosis present

## 2014-04-22 DIAGNOSIS — I2510 Atherosclerotic heart disease of native coronary artery without angina pectoris: Secondary | ICD-10-CM | POA: Diagnosis present

## 2014-04-22 DIAGNOSIS — S060X9A Concussion with loss of consciousness of unspecified duration, initial encounter: Secondary | ICD-10-CM | POA: Diagnosis not present

## 2014-04-22 DIAGNOSIS — F10929 Alcohol use, unspecified with intoxication, unspecified: Secondary | ICD-10-CM

## 2014-04-22 DIAGNOSIS — E236 Other disorders of pituitary gland: Secondary | ICD-10-CM | POA: Diagnosis present

## 2014-04-22 DIAGNOSIS — E739 Lactose intolerance, unspecified: Secondary | ICD-10-CM

## 2014-04-22 DIAGNOSIS — E872 Acidosis, unspecified: Secondary | ICD-10-CM | POA: Diagnosis present

## 2014-04-22 DIAGNOSIS — N390 Urinary tract infection, site not specified: Secondary | ICD-10-CM | POA: Diagnosis present

## 2014-04-22 DIAGNOSIS — Z7982 Long term (current) use of aspirin: Secondary | ICD-10-CM

## 2014-04-22 DIAGNOSIS — E871 Hypo-osmolality and hyponatremia: Secondary | ICD-10-CM | POA: Diagnosis present

## 2014-04-22 DIAGNOSIS — F1010 Alcohol abuse, uncomplicated: Secondary | ICD-10-CM | POA: Diagnosis present

## 2014-04-22 DIAGNOSIS — R109 Unspecified abdominal pain: Secondary | ICD-10-CM | POA: Diagnosis not present

## 2014-04-22 DIAGNOSIS — Z9089 Acquired absence of other organs: Secondary | ICD-10-CM

## 2014-04-22 DIAGNOSIS — S27329A Contusion of lung, unspecified, initial encounter: Secondary | ICD-10-CM | POA: Diagnosis present

## 2014-04-22 DIAGNOSIS — N138 Other obstructive and reflux uropathy: Secondary | ICD-10-CM

## 2014-04-22 DIAGNOSIS — R7309 Other abnormal glucose: Secondary | ICD-10-CM | POA: Diagnosis present

## 2014-04-22 DIAGNOSIS — J9819 Other pulmonary collapse: Secondary | ICD-10-CM | POA: Diagnosis not present

## 2014-04-22 DIAGNOSIS — W19XXXA Unspecified fall, initial encounter: Secondary | ICD-10-CM | POA: Diagnosis present

## 2014-04-22 DIAGNOSIS — D72829 Elevated white blood cell count, unspecified: Secondary | ICD-10-CM

## 2014-04-22 LAB — CBC WITH DIFFERENTIAL/PLATELET
BASOS ABS: 0 10*3/uL (ref 0.0–0.1)
BASOS PCT: 0 % (ref 0–1)
EOS ABS: 0.1 10*3/uL (ref 0.0–0.7)
Eosinophils Relative: 1 % (ref 0–5)
HCT: 34 % — ABNORMAL LOW (ref 39.0–52.0)
Hemoglobin: 11.6 g/dL — ABNORMAL LOW (ref 13.0–17.0)
Lymphocytes Relative: 28 % (ref 12–46)
Lymphs Abs: 3.5 10*3/uL (ref 0.7–4.0)
MCH: 31.7 pg (ref 26.0–34.0)
MCHC: 34.1 g/dL (ref 30.0–36.0)
MCV: 92.9 fL (ref 78.0–100.0)
Monocytes Absolute: 1 10*3/uL (ref 0.1–1.0)
Monocytes Relative: 8 % (ref 3–12)
NEUTROS PCT: 63 % (ref 43–77)
Neutro Abs: 7.9 10*3/uL — ABNORMAL HIGH (ref 1.7–7.7)
PLATELETS: 239 10*3/uL (ref 150–400)
RBC: 3.66 MIL/uL — ABNORMAL LOW (ref 4.22–5.81)
RDW: 17.1 % — AB (ref 11.5–15.5)
WBC: 12.4 10*3/uL — ABNORMAL HIGH (ref 4.0–10.5)

## 2014-04-22 LAB — COMPREHENSIVE METABOLIC PANEL
ALBUMIN: 2.9 g/dL — AB (ref 3.5–5.2)
ALK PHOS: 106 U/L (ref 39–117)
ALT: 38 U/L (ref 0–53)
ANION GAP: 20 — AB (ref 5–15)
AST: 197 U/L — ABNORMAL HIGH (ref 0–37)
BUN: 4 mg/dL — ABNORMAL LOW (ref 6–23)
CO2: 17 mEq/L — ABNORMAL LOW (ref 19–32)
Calcium: 8 mg/dL — ABNORMAL LOW (ref 8.4–10.5)
Chloride: 84 mEq/L — ABNORMAL LOW (ref 96–112)
Creatinine, Ser: 0.8 mg/dL (ref 0.50–1.35)
GFR calc Af Amer: >90 mL/min (ref >90)
GFR calc non Af Amer: >90 mL/min (ref >90)
Glucose, Bld: 135 mg/dL — ABNORMAL HIGH (ref 70–99)
POTASSIUM: 4 meq/L (ref 3.7–5.3)
SODIUM: 121 meq/L — AB (ref 137–147)
TOTAL PROTEIN: 7.8 g/dL (ref 6.0–8.3)
Total Bilirubin: 0.7 mg/dL (ref 0.3–1.2)

## 2014-04-22 LAB — LIPASE, BLOOD: LIPASE: 41 U/L (ref 11–59)

## 2014-04-22 LAB — ETHANOL: Alcohol, Ethyl (B): 296 mg/dL — ABNORMAL HIGH (ref 0–11)

## 2014-04-22 MED ORDER — ONDANSETRON HCL 4 MG/2ML IJ SOLN
4.0000 mg | Freq: Once | INTRAMUSCULAR | Status: DC
  Filled 2014-04-22: qty 2

## 2014-04-22 MED ORDER — ONDANSETRON HCL 4 MG/2ML IJ SOLN
4.0000 mg | Freq: Once | INTRAMUSCULAR | Status: AC
  Administered 2014-04-23: 4 mg via INTRAVENOUS

## 2014-04-22 MED ORDER — MORPHINE SULFATE 4 MG/ML IJ SOLN
4.0000 mg | Freq: Once | INTRAMUSCULAR | Status: AC
  Administered 2014-04-23: 4 mg via INTRAVENOUS
  Filled 2014-04-22: qty 1

## 2014-04-22 MED ORDER — SODIUM CHLORIDE 0.9 % IV BOLUS (SEPSIS)
1000.0000 mL | Freq: Once | INTRAVENOUS | Status: AC
  Administered 2014-04-23: 1000 mL via INTRAVENOUS

## 2014-04-22 NOTE — ED Notes (Addendum)
Pt. Reports falling earlier tonight, pt reports he does not know how he fell or what he hit. Pt. C/o right rib pain. Pt. Reports drinking alcohol tonight. Pt. Alert and oriented x4. NAD noted.

## 2014-04-22 NOTE — ED Notes (Signed)
Pt c/o of right flank since since around 2100. Denies blood in urine.

## 2014-04-22 NOTE — ED Provider Notes (Signed)
CSN: [id number]     Arrival date & time 04/22/14  2225 History   This chart was scribed for Shon Baton, MD by Evon Slack, ED Scribe. This patient was seen in room [facility]07/[facility]07 and the patient's care was started at 11:03 PM.     Chief Complaint  Patient presents with  . Flank Pain   Patient is a 63 y.o. male presenting with flank pain. The history is provided by the patient. No language interpreter was used.  Flank Pain Pertinent negatives include no chest pain, no abdominal pain, no headaches and no shortness of breath.   HPI Comments: Billy Stone is a 63 y.o. male who presents to the Emergency Department complaining of sharp right sided flank pain onset 2 hours prior. He states that the pain radiates up his right arm. He states that he has consumed a large amount of alcohol tonight. He states that he fell tonight but doesn't think his pain is related to the fall. He states he is unsure if he lost consciousness.  Unsure whether he hit his head. Does not recall whether he tripped and fell or passed out. He states that warm compress improves his symptoms. He states the severity of his pain is 10/10 worse with movement. He states he is on aspirin. Denies nausea, vomiting, or any urinary symptoms.    History reviewed. No pertinent past medical history. History reviewed. No pertinent past surgical history. History reviewed. No pertinent family history. History  Substance Use Topics  . Smoking status: Never Smoker   . Smokeless tobacco: Not on file  . Alcohol Use: Not on file    Review of Systems  Constitutional: Negative.  Negative for fever.  Respiratory: Negative.  Negative for chest tightness and shortness of breath.   Cardiovascular: Negative.  Negative for chest pain.  Gastrointestinal: Negative.  Negative for nausea, vomiting and abdominal pain.  Genitourinary: Positive for flank pain. Negative for dysuria.  Musculoskeletal: Negative for back pain.  Skin: Negative for  rash.  Neurological: Negative for dizziness, weakness and headaches.  All other systems reviewed and are negative.     Allergies  Amlodipine besylate and Penicillins  Home Medications   Prior to Admission medications   Not on File   Triage Vitals: BP 115/60  Pulse 82  Temp(Src) 97.8 F (36.6 C) (Oral)  Ht  (1.651 m)  Wt 200 lb (90.719 kg)  BMI 33.28 kg/m2  Physical Exam  Nursing note and vitals reviewed. Constitutional: He is oriented to person, place, and time. No distress.  Slurred speech  HENT:  Head: Normocephalic and atraumatic.  Mouth/Throat: Oropharynx is clear and moist.  Eyes: Pupils are equal, round, and reactive to light.  Neck: Normal range of motion. Neck supple.  No midline C-spine tenderness  Cardiovascular: Normal rate, regular rhythm and normal heart sounds.   No murmur heard. Pulmonary/Chest: Effort normal and breath sounds normal. No respiratory distress. He has no wheezes. He exhibits tenderness.  Right-sided chest wall tenderness  Abdominal: Soft. Bowel sounds are normal. There is tenderness. There is no rebound.  Tenderness palpation of the right upper quadrant without rebound or guarding  Musculoskeletal: He exhibits no edema.  Lymphadenopathy:    He has no cervical adenopathy.  Neurological: He is alert and oriented to person, place, and time.  Moves all 4 extremities  Skin: Skin is warm and dry.  Psychiatric:  Appears intoxicated    ED Course  Procedures (including critical care time)  Labs Review Labs  Reviewed  CBC WITH DIFFERENTIAL - Abnormal; Notable for the following:    WBC 12.4 (*)    RBC 3.66 (*)    Hemoglobin 11.6 (*)    HCT 34.0 (*)    RDW 17.1 (*)    Neutro Abs 7.9 (*)    All other components within normal limits  COMPREHENSIVE METABOLIC PANEL - Abnormal; Notable for the following:    Sodium 121 (*)    Chloride 84 (*)    CO2 17 (*)    Glucose, Bld 135 (*)    BUN 4 (*)    Calcium 8.0 (*)    Albumin 2.9 (*)     AST 197 (*)    Anion gap 20 (*)    All other components within normal limits  ETHANOL - Abnormal; Notable for the following:    Alcohol, Ethyl (B) 296 (*)    All other components within normal limits  LIPASE, BLOOD  URINALYSIS, ROUTINE W REFLEX MICROSCOPIC    Imaging Review Dg Chest 2 View  04/23/2014   CLINICAL DATA:  FLANK PAIN  EXAM: CHEST  2 VIEW  COMPARISON:  Prior radiograph from 12/10/2009  FINDINGS: The cardiac and mediastinal silhouettes are stable in size and contour, and remain within normal limits.  The lungs are normally inflated. Linear right perihilar opacities most consistent with atelectasis and/ or scarring. Minimal left basilar atelectasis present. No airspace consolidation, pleural effusion, or pulmonary edema is identified. There is no pneumothorax.  No acute osseous abnormality identified.  IMPRESSION: No active cardiopulmonary disease.   Electronically Signed   By: Rise Mu M.D.   On: 04/23/2014 00:41   Ct Head Wo Contrast  04/23/2014   CLINICAL DATA:  Loss of consciousness following a fall.  EXAM: CT HEAD WITHOUT CONTRAST  CT CERVICAL SPINE WITHOUT CONTRAST  TECHNIQUE: Multidetector CT imaging of the head and cervical spine was performed following the standard protocol without intravenous contrast. Multiplanar CT image reconstructions of the cervical spine were also generated.  COMPARISON:  None.  FINDINGS: CT HEAD FINDINGS  Diffusely enlarged ventricles and subarachnoid spaces. Patchy white matter low density in both cerebral hemispheres. No skull fracture, intracranial hemorrhage or paranasal sinus air-fluid levels.  CT CERVICAL SPINE FINDINGS  Mild multilevel degenerative changes. No prevertebral soft tissue swelling, fractures or subluxations. Atheromatous arterial calcifications, including both carotid arteries.  IMPRESSION: 1. No skull fracture or intracranial hemorrhage. 2. No cervical spine fracture or subluxation. 3. Mild diffuse cerebral atrophy and  minimal chronic small vessel white matter ischemic changes in both cerebral hemispheres. 4. Mild cervical spine degenerative changes. 5. Atheromatous arterial calcifications, including the carotid arteries.   Electronically Signed   By: Gordan Payment M.D.   On: 04/23/2014 01:12   Ct Chest W Contrast  04/23/2014   CLINICAL DATA:  Trauma. Fell after consuming alcohol. Pain in the right upper quadrant and right axillary ribs.  EXAM: CT CHEST, ABDOMEN, AND PELVIS WITH CONTRAST  TECHNIQUE: Multidetector CT imaging of the chest, abdomen and pelvis was performed following the standard protocol during bolus administration of intravenous contrast.  CONTRAST:  OMNIPAQUE IOHEXOL 300 MG/ML  SOLN  COMPARISON:  CT abdomen and pelvis 03/08/2010  FINDINGS: CT CHEST FINDINGS  Normal heart size. Diffuse Coronary artery calcifications. Calcification of aorta. No aneurysm. Great vessel origins appear patent. No significant lymphadenopathy in the chest. Esophagus is decompressed. No abnormal mediastinal fluid collections.  Evaluation of lungs is limited due to motion artifact. There appears to be patchy ground-glass change which  may represent atelectasis or contusion. No pneumothorax. No pleural effusion. No focal consolidation.  CT ABDOMEN AND PELVIS FINDINGS  Diffuse fatty infiltration of the liver. The gallbladder, pancreas, adrenal glands, inferior vena cava, and retroperitoneal lymph nodes are unremarkable. Diffuse calcification of the abdominal aorta and superior mesenteric artery. No aneurysm. Calcification of the iliac arteries suggests evidence of stenosis. There is a cyst in the lower pole of the right kidney. No hydronephrosis in either kidney. No contrast extravasation. 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 Stomach, small bowel, and colon are not abnormally distended. No free air or free fluid in the abdomen. No abnormal mesenteric or retroperitoneal fluid collections.  Pelvis: Prostate gland is not enlarged. Fat in  the left inguinal canal. Bladder wall is not thickened. There is a small right posterior bladder diverticulum. Scattered diverticula in the sigmoid colon without diverticulitis. Appendix is not identified. No free or loculated pelvic fluid collections.  Bones: Displaced fractures of the right posterior lateral fifth, sixth, seventh, eighth, ninth, ribs. Left ribs are nondisplaced. Visualized clavicles, shoulders, and sternum appear intact. New normal alignment of the thoracic and lumbar spine. Degenerative changes present. No vertebral compression deformities. The sacrum, pelvis, and hips appear intact.  IMPRESSION: Multiple displaced right rib fractures. No pneumothorax. Possible patchy parenchymal contusions in the right lung. No acute posttraumatic changes demonstrated in the abdomen or pelvis. No evidence of solid organ injury or bowel perforation. Postoperative splenectomy. Fatty infiltration of the liver. Extensive vascular calcifications in the abdomen.   Electronically Signed   By: William  Stevens M.D.   On: 04/23/2014 01:15   Ct Cervical Spine Wo Contrast  04/23/2014   CLINICAL DATA:  Loss of consciousness following a fall.  EXAM: CT HEAD WITHOUT CONTRAST  CT CERVICAL SPINE WITHOUT CONTRAST  TECHNIQUE: Multidetector CT imaging of the head and cervical spine was performed following the standard protocol without intravenous contrast. Multiplanar CT image reconstructions of the cervical spine were also generated.  COMPARISON:  None.  FINDINGS: CT HEAD FINDINGS  Diffusely enlarged ventricles and subarachnoid spaces. Patchy white matter low density in both cerebral hemispheres. No skull fracture, intracranial hemorrhage or paranasal sinus air-fluid levels.  CT CERVICAL SPINE FINDINGS  Mild multilevel degenerative changes. No prevertebral soft tissue swelling, fractures or subluxations. Atheromatous arterial calcifications, including both carotid arteries.  IMPRESSION: 1. No skull fracture or intracranial  hemorrhage. 2. No cervical spine fracture or subluxation. 3. Mild diffuse cerebral atrophy and minimal chronic small vessel white matter ischemic changes in both cerebral hemispheres. 4. Mild cervical spine degenerative changes. 5. Atheromatous arterial calcifications, including the carotid arteries.   Electronically Signed   By: Steve  Reid M.D.   On: 04/23/2014 01:12   Ct Abdomen W Contrast  04/23/2014   CLINICAL DATA:  Trauma. Fell after consuming alcohol. Pain in the right upper quadrant and right axillary ribs.  EXAM: CT CHEST, ABDOMEN, AND PELVIS WITH CONTRAST  TECHNIQUE: Multidetector CT imaging of the chest, abdomen and pelvis was performed following the standard protocol during [email or web address]  COMPARISON:  CT abdomen and pelvis 03/08/2010  FINDINGS: CT CHEST FINDINGS  Normal heart size. Diffuse Coronary artery calcifications. Calcification of aorta. No aneurysm. Great vessel origins appear patent. No significant lymphadenopathy in the chest. Esophagus is decompressed. No abnormal mediastinal fluid collections.  Evaluation of lungs is limited due to motion artifact. There appears to be patchy ground-glass change which may represent atelectasis or contusion. No pneumothorax. No  pleural effusion. No focal consolidation.  CT ABDOMEN AND PELVIS FINDINGS  Diffuse fatty infiltration of the liver. The gallbladder, pancreas, adrenal glands, inferior vena cava, and retroperitoneal lymph nodes are unremarkable. Diffuse calcification of the abdominal aorta and superior mesenteric artery. No aneurysm. Calcification of the iliac arteries suggests evidence of stenosis. There is a cyst in the lower pole of the right kidney. No hydronephrosis in either kidney. No contrast extravasation. 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 Stomach, small bowel, and colon are not abnormally distended. No free air or free fluid in the abdomen. No abnormal  mesenteric or retroperitoneal fluid collections.  Pelvis: Prostate gland is not enlarged. Fat in the left inguinal canal. Bladder wall is not thickened. There is a small right posterior bladder diverticulum. Scattered diverticula in the sigmoid colon without diverticulitis. Appendix is not identified. No free or loculated pelvic fluid collections.  Bones: Displaced fractures of the right posterior lateral fifth, sixth, seventh, eighth, ninth, ribs. Left ribs are nondisplaced. Visualized clavicles, shoulders, and sternum appear intact. New normal alignment of the thoracic and lumbar spine. Degenerative changes present. No vertebral compression deformities. The sacrum, pelvis, and hips appear intact.  IMPRESSION: Multiple displaced right rib fractures. No pneumothorax. Possible patchy parenchymal contusions in the right lung. No acute posttraumatic changes demonstrated in the abdomen or pelvis. No evidence of solid organ injury or bowel perforation. Postoperative splenectomy. Fatty infiltration of the liver. Extensive vascular calcifications in the abdomen.   Electronically Signed   By: Burman Nieves M.D.   On: 04/23/2014 01:15     EKG Interpretation Independently reviewed by myself: Normal sinus rhythm with a rate of 68, no evidence of acute ST elevation or ischemia, RSR'      MDM   Final diagnoses:  Rib fractures, right, closed, initial encounter  Hyponatremia  Alcohol intoxication, with unspecified complication   Patient presents with right flank, chest, and abdominal pain following a fall. He is obviously intoxicated. Unsure of whether he lost consciousness. No signs of head trauma. Unable to clear C-spine secondary to intoxication. Basic labwork obtained. Patient was given pain medication. Patient also given 1 L of fluids.  Lab work notable for hyponatremia at 121. History of the same with similar low numbers. Also elevated AST of 197. This likely consistent with chronic alcohol use. Patient is  a daily drinker. Alcohol level is almost 300. Given patient's clinical exam, will obtain CT head, neck, chest abdomen and pelvis. CT scan with multiple rib fractures on the right. Given hyponatremia and need for aggressive pain management secondary to rib fractures, will admit for fluids and pain management.  Discussed with Dr. Sharl Ma.   I personally performed the services described in this documentation, which was scribed in my presence. The recorded information has been reviewed and is accurate.      Shon Baton, MD 04/23/14 [phone]

## 2014-04-23 ENCOUNTER — Emergency Department (HOSPITAL_COMMUNITY): Payer: Medicare Other

## 2014-04-23 DIAGNOSIS — E236 Other disorders of pituitary gland: Secondary | ICD-10-CM | POA: Diagnosis present

## 2014-04-23 DIAGNOSIS — N390 Urinary tract infection, site not specified: Secondary | ICD-10-CM | POA: Diagnosis present

## 2014-04-23 DIAGNOSIS — I2510 Atherosclerotic heart disease of native coronary artery without angina pectoris: Secondary | ICD-10-CM | POA: Diagnosis present

## 2014-04-23 DIAGNOSIS — S2249XA Multiple fractures of ribs, unspecified side, initial encounter for closed fracture: Secondary | ICD-10-CM | POA: Diagnosis present

## 2014-04-23 DIAGNOSIS — F1010 Alcohol abuse, uncomplicated: Secondary | ICD-10-CM

## 2014-04-23 DIAGNOSIS — J449 Chronic obstructive pulmonary disease, unspecified: Secondary | ICD-10-CM | POA: Diagnosis not present

## 2014-04-23 DIAGNOSIS — J440 Chronic obstructive pulmonary disease with acute lower respiratory infection: Secondary | ICD-10-CM | POA: Diagnosis present

## 2014-04-23 DIAGNOSIS — S27329A Contusion of lung, unspecified, initial encounter: Secondary | ICD-10-CM | POA: Diagnosis present

## 2014-04-23 DIAGNOSIS — B9689 Other specified bacterial agents as the cause of diseases classified elsewhere: Secondary | ICD-10-CM | POA: Diagnosis present

## 2014-04-23 DIAGNOSIS — E871 Hypo-osmolality and hyponatremia: Secondary | ICD-10-CM

## 2014-04-23 DIAGNOSIS — R7309 Other abnormal glucose: Secondary | ICD-10-CM | POA: Diagnosis present

## 2014-04-23 DIAGNOSIS — W19XXXA Unspecified fall, initial encounter: Secondary | ICD-10-CM | POA: Diagnosis present

## 2014-04-23 DIAGNOSIS — E872 Acidosis, unspecified: Secondary | ICD-10-CM | POA: Diagnosis present

## 2014-04-23 DIAGNOSIS — D72829 Elevated white blood cell count, unspecified: Secondary | ICD-10-CM

## 2014-04-23 DIAGNOSIS — S2239XA Fracture of one rib, unspecified side, initial encounter for closed fracture: Secondary | ICD-10-CM

## 2014-04-23 DIAGNOSIS — S060X9A Concussion with loss of consciousness of unspecified duration, initial encounter: Secondary | ICD-10-CM | POA: Diagnosis not present

## 2014-04-23 DIAGNOSIS — Z7982 Long term (current) use of aspirin: Secondary | ICD-10-CM | POA: Diagnosis not present

## 2014-04-23 DIAGNOSIS — Y92009 Unspecified place in unspecified non-institutional (private) residence as the place of occurrence of the external cause: Secondary | ICD-10-CM | POA: Diagnosis not present

## 2014-04-23 DIAGNOSIS — R109 Unspecified abdominal pain: Secondary | ICD-10-CM | POA: Diagnosis present

## 2014-04-23 DIAGNOSIS — I10 Essential (primary) hypertension: Secondary | ICD-10-CM | POA: Diagnosis present

## 2014-04-23 DIAGNOSIS — R1011 Right upper quadrant pain: Secondary | ICD-10-CM | POA: Diagnosis not present

## 2014-04-23 DIAGNOSIS — S3981XA Other specified injuries of abdomen, initial encounter: Secondary | ICD-10-CM | POA: Diagnosis not present

## 2014-04-23 DIAGNOSIS — J9819 Other pulmonary collapse: Secondary | ICD-10-CM | POA: Diagnosis not present

## 2014-04-23 LAB — CBC
HCT: 34.1 % — ABNORMAL LOW (ref 39.0–52.0)
Hemoglobin: 11.8 g/dL — ABNORMAL LOW (ref 13.0–17.0)
MCH: 32.4 pg (ref 26.0–34.0)
MCHC: 34.6 g/dL (ref 30.0–36.0)
MCV: 93.7 fL (ref 78.0–100.0)
PLATELETS: 193 10*3/uL (ref 150–400)
RBC: 3.64 MIL/uL — ABNORMAL LOW (ref 4.22–5.81)
RDW: 17.2 % — AB (ref 11.5–15.5)
WBC: 11.4 10*3/uL — AB (ref 4.0–10.5)

## 2014-04-23 LAB — COMPREHENSIVE METABOLIC PANEL
ALBUMIN: 3 g/dL — AB (ref 3.5–5.2)
ALT: 39 U/L (ref 0–53)
AST: 178 U/L — ABNORMAL HIGH (ref 0–37)
Alkaline Phosphatase: 108 U/L (ref 39–117)
Anion gap: 17 — ABNORMAL HIGH (ref 5–15)
BUN: 4 mg/dL — ABNORMAL LOW (ref 6–23)
CALCIUM: 8 mg/dL — AB (ref 8.4–10.5)
CO2: 20 mEq/L (ref 19–32)
CREATININE: 0.72 mg/dL (ref 0.50–1.35)
Chloride: 87 mEq/L — ABNORMAL LOW (ref 96–112)
GFR calc Af Amer: >90 mL/min (ref >90)
GFR calc non Af Amer: >90 mL/min (ref >90)
Glucose, Bld: 120 mg/dL — ABNORMAL HIGH (ref 70–99)
Potassium: 4.8 mEq/L (ref 3.7–5.3)
Sodium: 124 mEq/L — ABNORMAL LOW (ref 137–147)
TOTAL PROTEIN: 8 g/dL (ref 6.0–8.3)
Total Bilirubin: 0.7 mg/dL (ref 0.3–1.2)

## 2014-04-23 LAB — URINALYSIS, ROUTINE W REFLEX MICROSCOPIC
Bilirubin Urine: NEGATIVE
Glucose, UA: NEGATIVE mg/dL
Ketones, ur: NEGATIVE mg/dL
NITRITE: POSITIVE — AB
PH: 5.5 (ref 5.0–8.0)
Protein, ur: NEGATIVE mg/dL
Urobilinogen, UA: 0.2 mg/dL (ref 0.0–1.0)

## 2014-04-23 LAB — URINE MICROSCOPIC-ADD ON

## 2014-04-23 LAB — HEMOGLOBIN A1C
HEMOGLOBIN A1C: 5.4 % (ref <5.7)
MEAN PLASMA GLUCOSE: 108 mg/dL (ref <117)

## 2014-04-23 LAB — LACTIC ACID, PLASMA: Lactic Acid, Venous: 3.7 mmol/L — ABNORMAL HIGH (ref 0.5–2.2)

## 2014-04-23 LAB — OSMOLALITY, URINE: Osmolality, Ur: 239 mOsm/kg — ABNORMAL LOW (ref 390–1090)

## 2014-04-23 LAB — OSMOLALITY: OSMOLALITY: 293 mosm/kg (ref 275–300)

## 2014-04-23 IMAGING — CT CT ABDOMEN W/ CM
2 of 5 series · 14 of 46 positions shown, 16 images · IV contrast (Omnipaque 300)
Comparison: CT abdomen and pelvis [DATE]

CLINICAL DATA: Trauma. Fell after consuming alcohol. Pain in the
right upper quadrant and right axillary ribs.

EXAM:
CT CHEST, ABDOMEN, AND PELVIS WITH CONTRAST
TECHNIQUE: Multidetector CT imaging of the chest, abdomen and pelvis was
performed following the standard protocol during bolus
administration of intravenous contrast.
CONTRAST:  100mL OMNIPAQUE IOHEXOL 300 MG/ML  SOLN

[Series 2: cap with 5.0 b40f · axial · 0.79mm/px · z∈[-512,+73]mm · 11 of 133 slices shown, 13 images]
[im 8/133  soft-tissue]
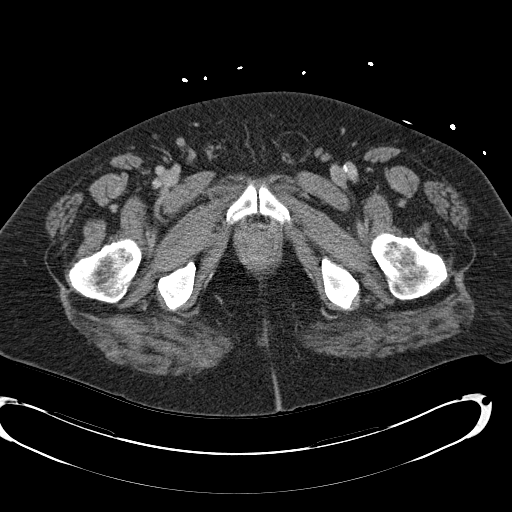
[im 8/133  bone]
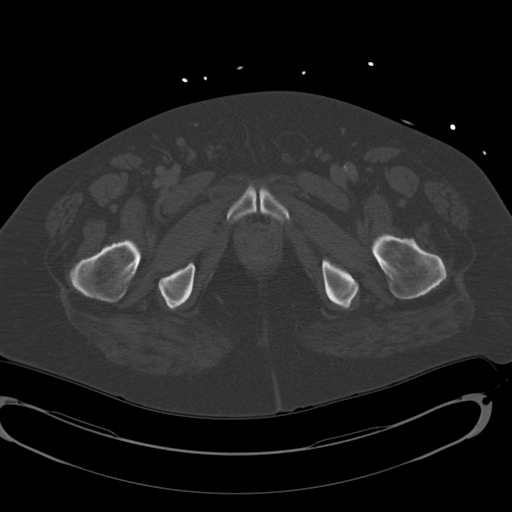
[im 24/133  soft-tissue]
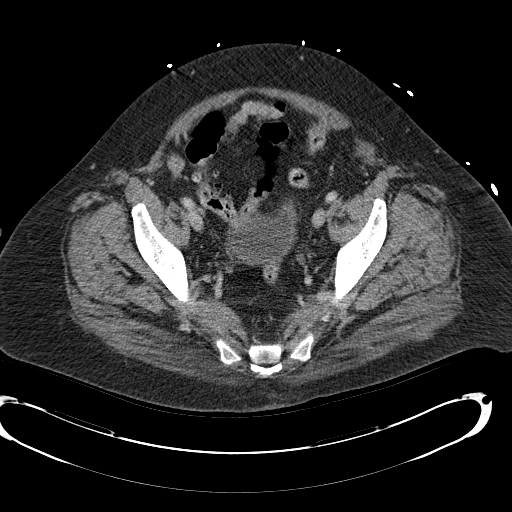
[im 32/133  soft-tissue]
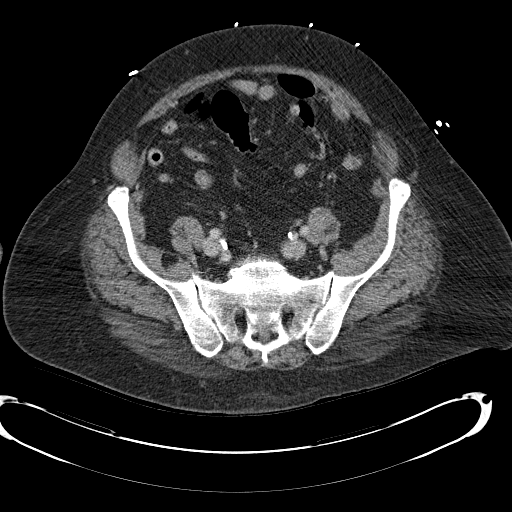
[im 47/133  soft-tissue]
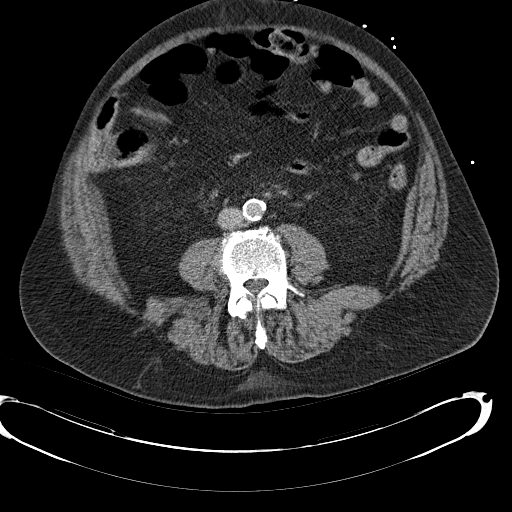
[im 55/133  soft-tissue]
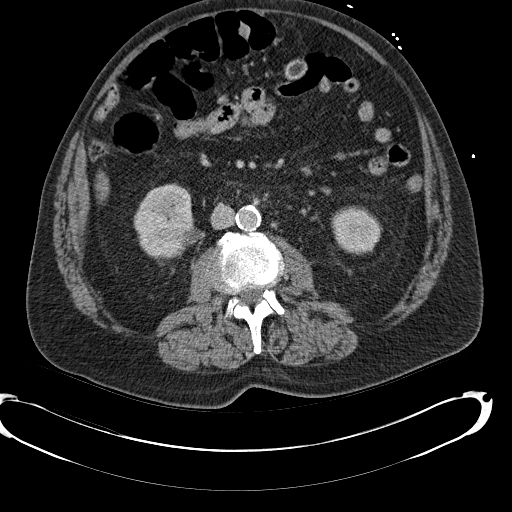
[im 70/133  soft-tissue]
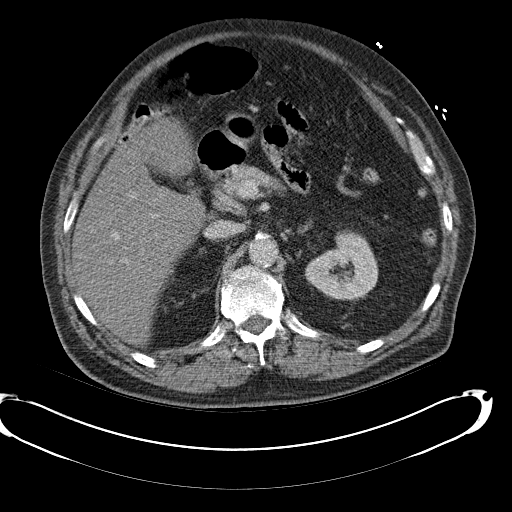
[im 78/133  soft-tissue]
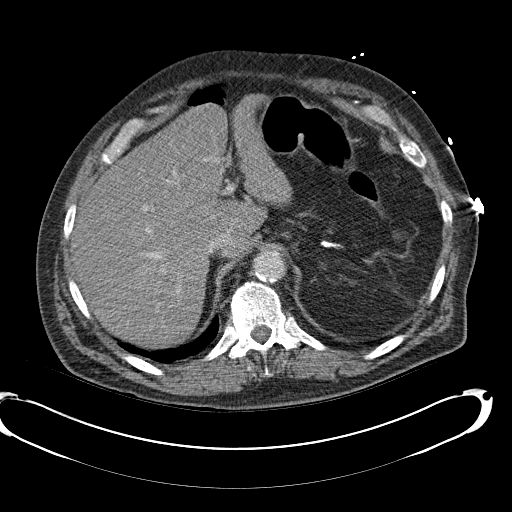
[im 86/133  soft-tissue]
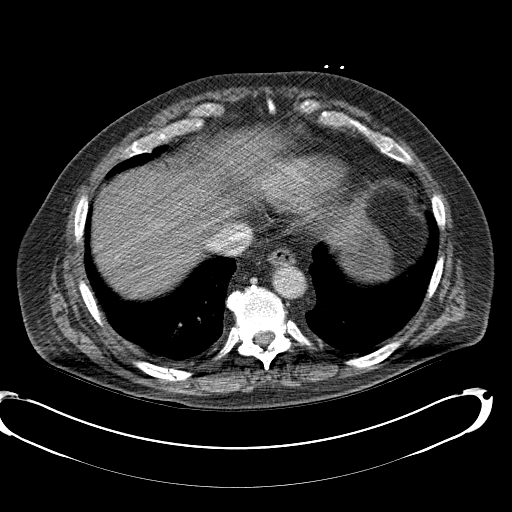
[im 101/133  soft-tissue]
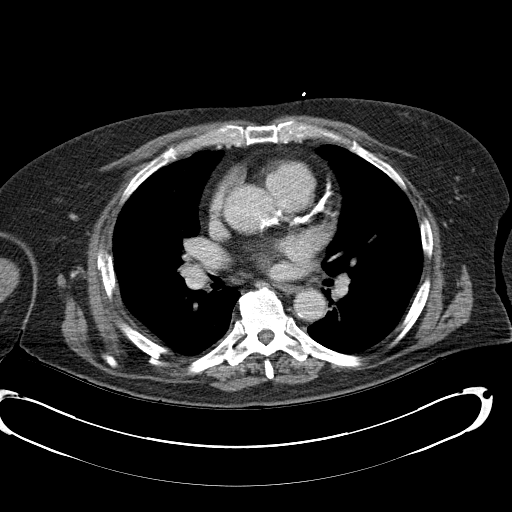
[im 101/133  bone]
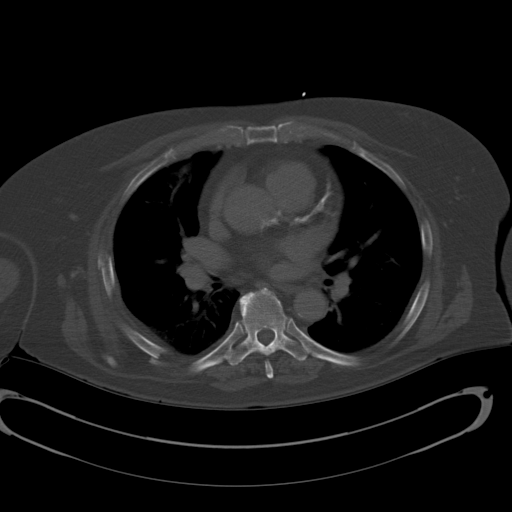
[im 109/133  soft-tissue]
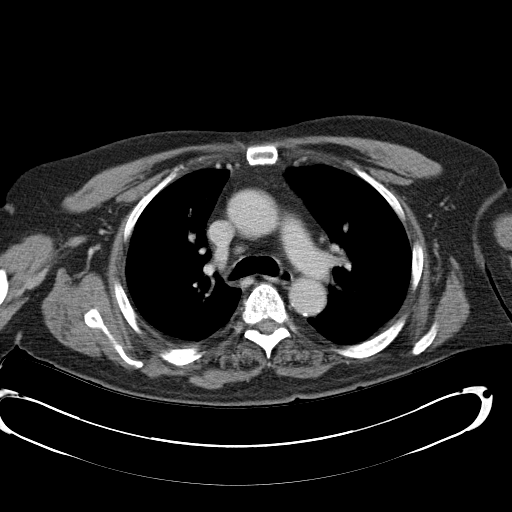
[im 125/133  soft-tissue]
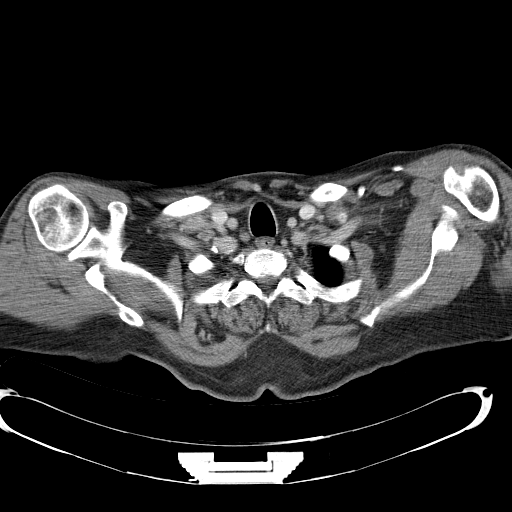

[Series 3: mpr cor post contrast (id) · coronal · 0.80mm/px · 3 of 126 slices shown]
[im 42/126  soft-tissue]
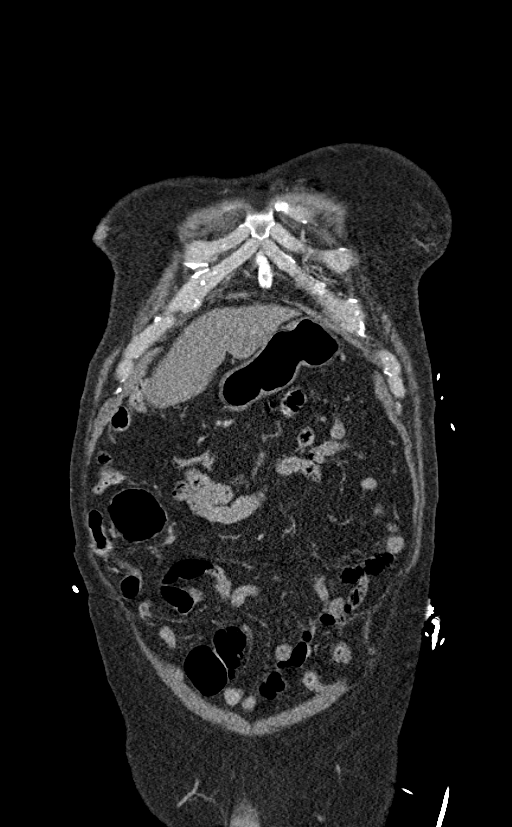
[im 56/126  soft-tissue]
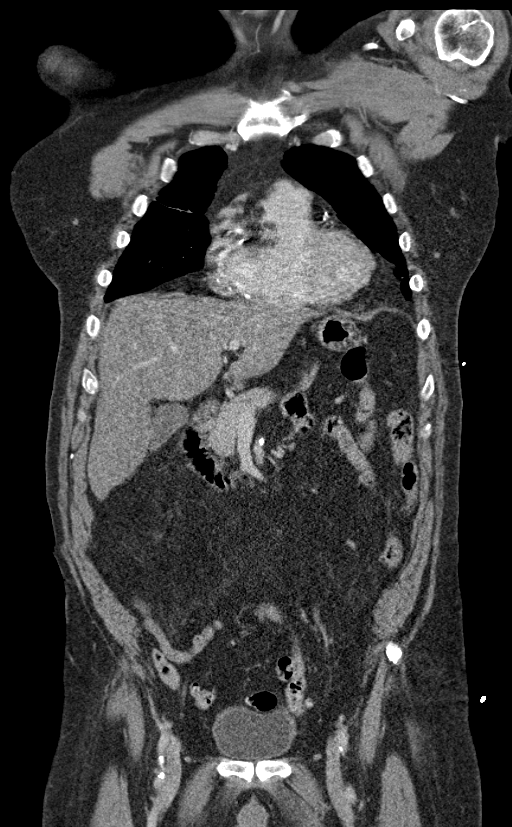
[im 70/126  soft-tissue]
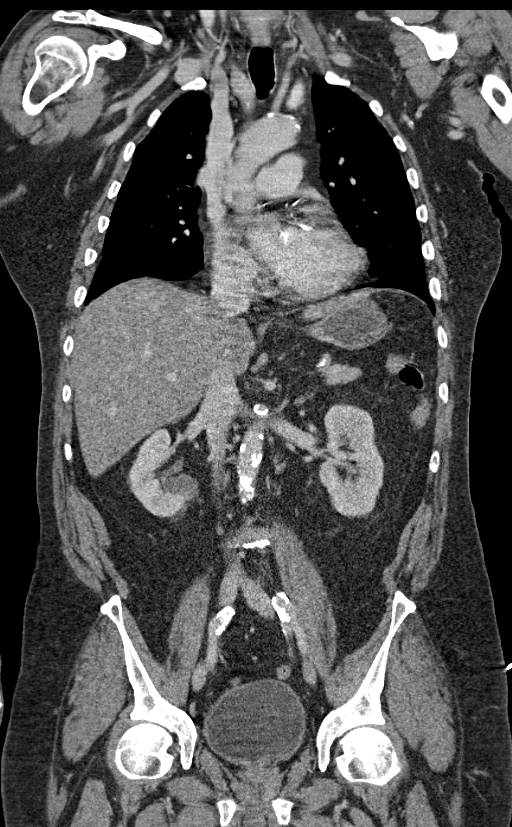

[14 of 46 positions shown; findings below may reference images not displayed]

FINDINGS: CT CHEST FINDINGS

Normal heart size. Diffuse Coronary artery calcifications.
Calcification of aorta. No aneurysm. Great vessel origins appear
patent. No significant lymphadenopathy in the chest. Esophagus is
decompressed. No abnormal mediastinal fluid collections.

Evaluation of lungs is limited due to motion artifact. There appears
to be patchy ground-glass change which may represent atelectasis or
contusion. No pneumothorax. No pleural effusion. No focal
consolidation.

CT ABDOMEN AND PELVIS FINDINGS

Diffuse fatty infiltration of the liver. The gallbladder, pancreas,
adrenal glands, inferior vena cava, and retroperitoneal lymph nodes
are unremarkable. Diffuse calcification of the abdominal aorta and
superior mesenteric artery. No aneurysm. Calcification of the iliac
arteries suggests evidence of stenosis. There is a cyst in the lower
pole of the right kidney. No hydronephrosis in either kidney. No
contrast extravasation. Spleen is absent, likely postoperative.
Stomach, small bowel, and colon are not abnormally distended. No
free air or free fluid in the abdomen. No abnormal mesenteric or
retroperitoneal fluid collections.

Pelvis: Prostate gland is not enlarged. Fat in the left inguinal
canal. Bladder wall is not thickened. There is a small right
posterior bladder diverticulum. Scattered diverticula in the sigmoid
colon without diverticulitis. Appendix is not identified. No free or
loculated pelvic fluid collections.

Bones: Displaced fractures of the right posterior lateral fifth,
sixth, seventh, eighth, ninth, ribs. Left ribs are nondisplaced.
Visualized clavicles, shoulders, and sternum appear intact. New
normal alignment of the thoracic and lumbar spine. Degenerative
changes present. No vertebral compression deformities. The sacrum,
pelvis, and hips appear intact.
IMPRESSION: Multiple displaced right rib fractures. No pneumothorax. Possible
patchy parenchymal contusions in the right lung. No acute
posttraumatic changes demonstrated in the abdomen or pelvis. No
evidence of solid organ injury or bowel perforation. Postoperative
splenectomy. Fatty infiltration of the liver. Extensive vascular
calcifications in the abdomen.

## 2014-04-23 IMAGING — CT CT HEAD W/O CM
4 of 6 series · 13 of 47 positions shown, 14 images · non-contrast
Comparison: None.

CLINICAL DATA: Loss of consciousness following a fall.

EXAM:
CT HEAD WITHOUT CONTRAST
CT CERVICAL SPINE WITHOUT CONTRAST
TECHNIQUE: Multidetector CT imaging of the head and cervical spine was
performed following the standard protocol without intravenous
contrast. Multiplanar CT image reconstructions of the cervical spine
were also generated.

[Series 3: headseq 2.4 h60s · axial · 0.47mm/px · z∈[+296,+386]mm · 4 of 60 slices shown, 5 images]
[im 12/60  brain]
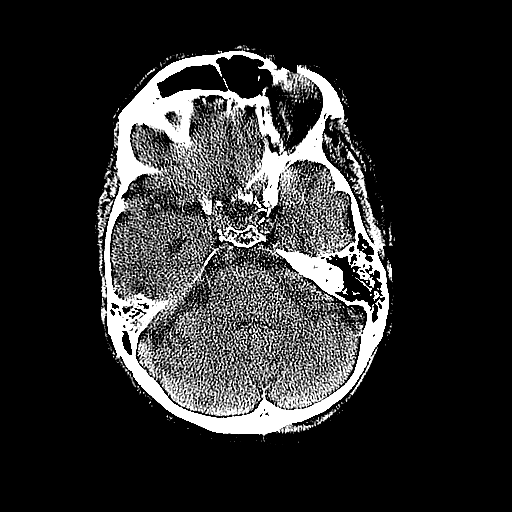
[im 12/60  bone]
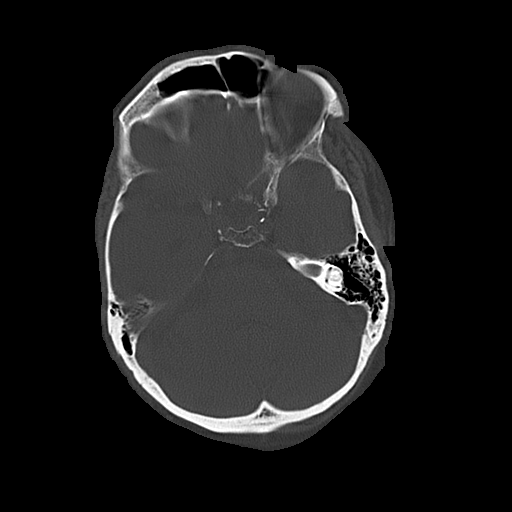
[im 24/60  brain]
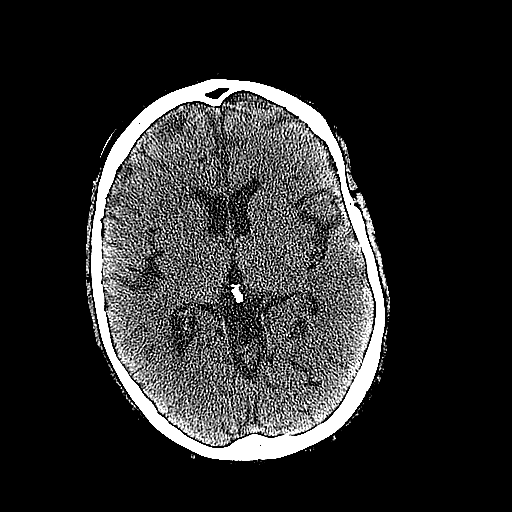
[im 36/60  brain]
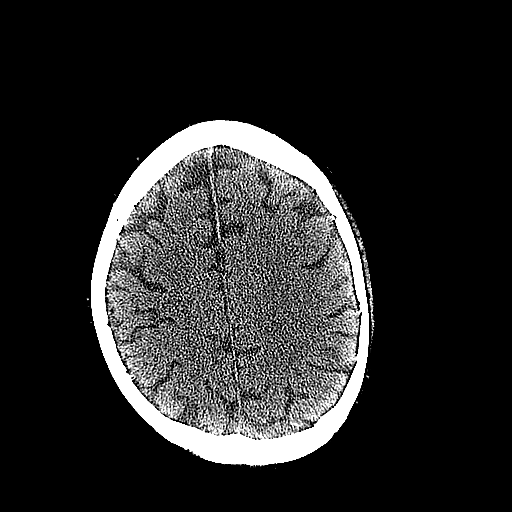
[im 48/60  brain]
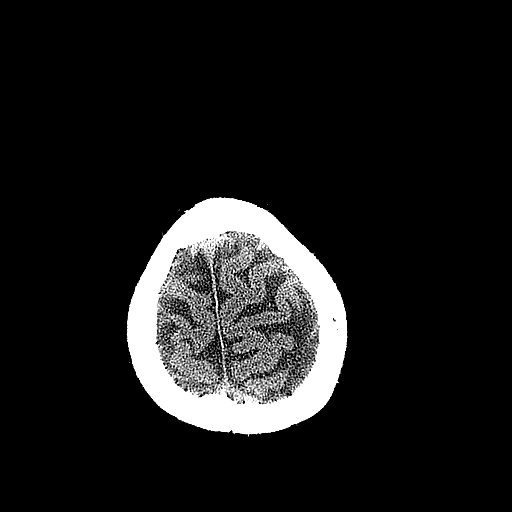

[Series 7: sagittal bone 2.0 · sagittal · 0.25mm/px · 3 of 59 slices shown]
[im 20/59  brain]
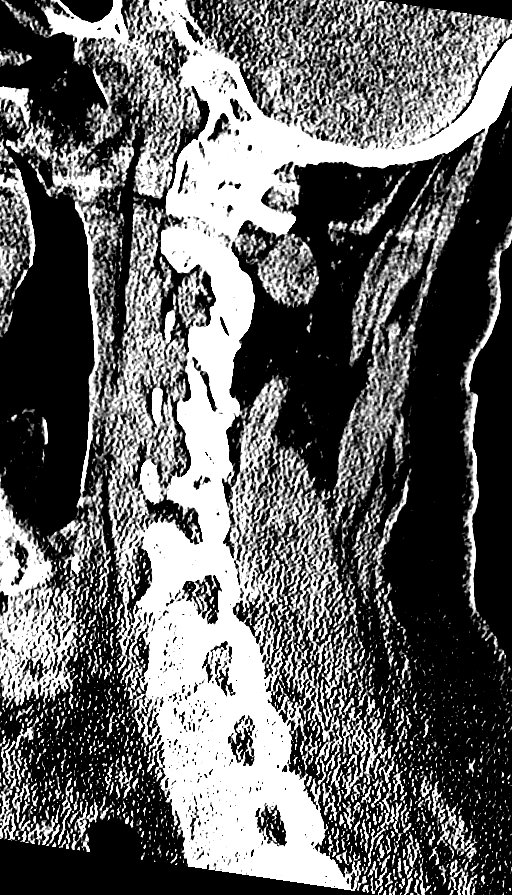
[im 30/59  brain]
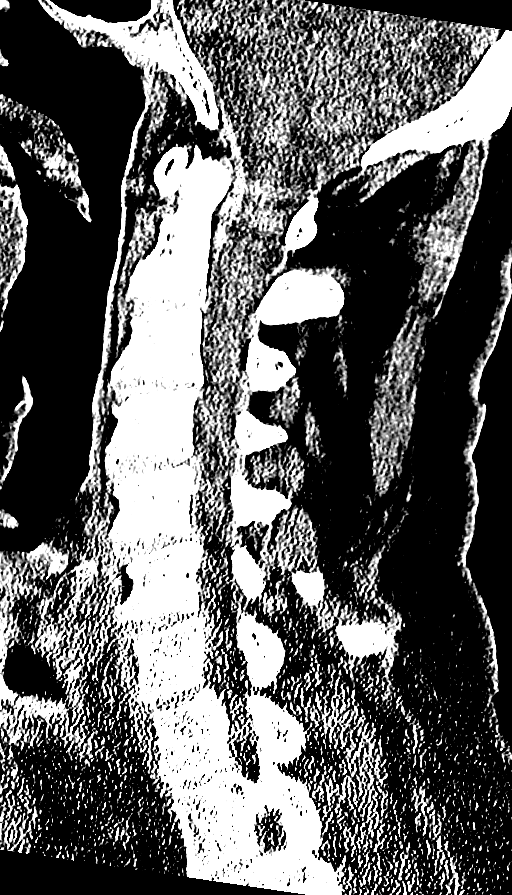
[im 39/59  brain]
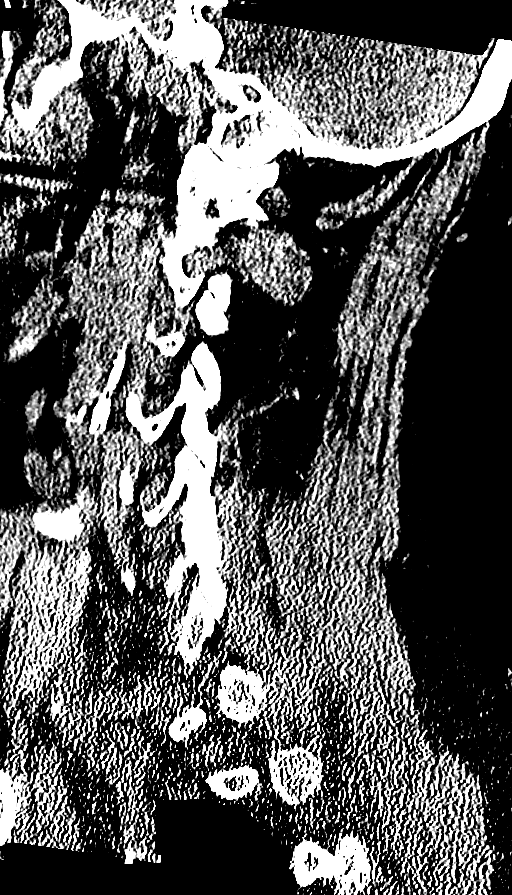

[Series 8: coronal bone 2.0 · coronal · 0.25mm/px · 3 of 56 slices shown]
[im 19/56  brain]
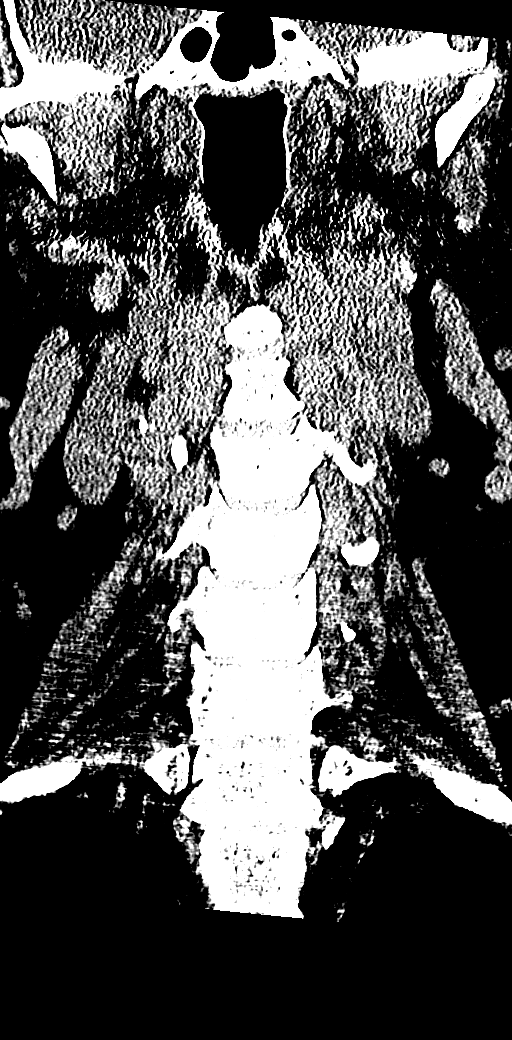
[im 25/56  brain]
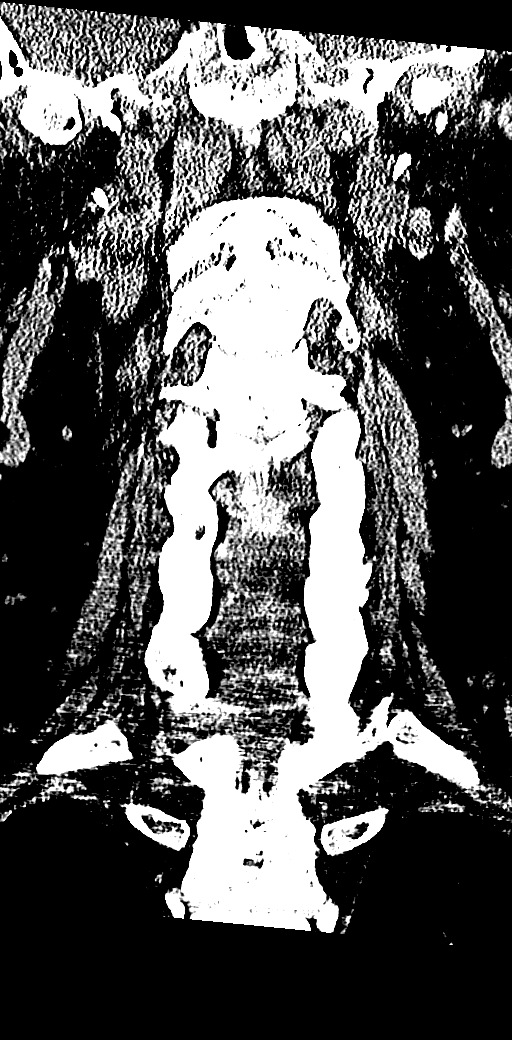
[im 31/56  brain]
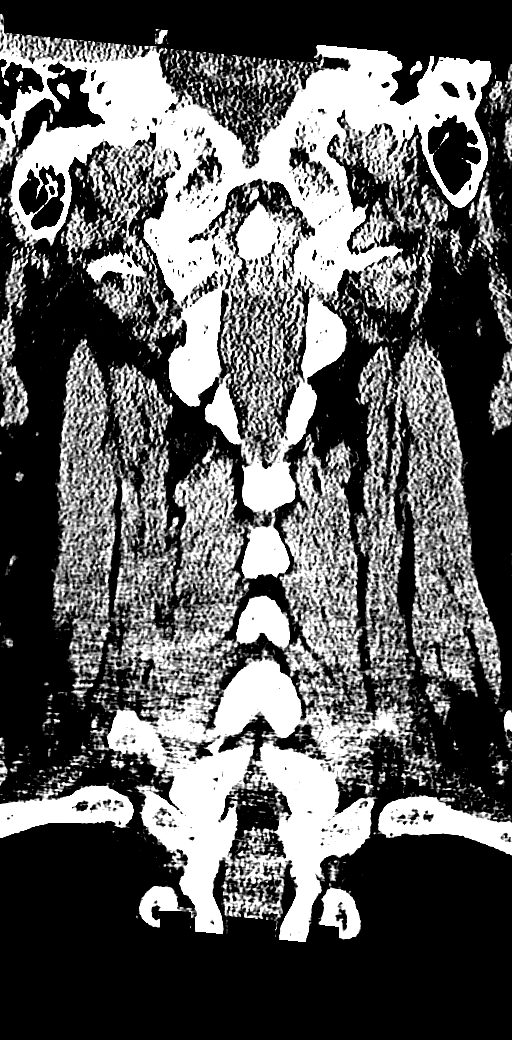

[Series 9: axial bone 2.0 · axial · 0.23mm/px · z∈[+42,+86]mm · 3 of 112 slices shown]
[im 12/112  bone]
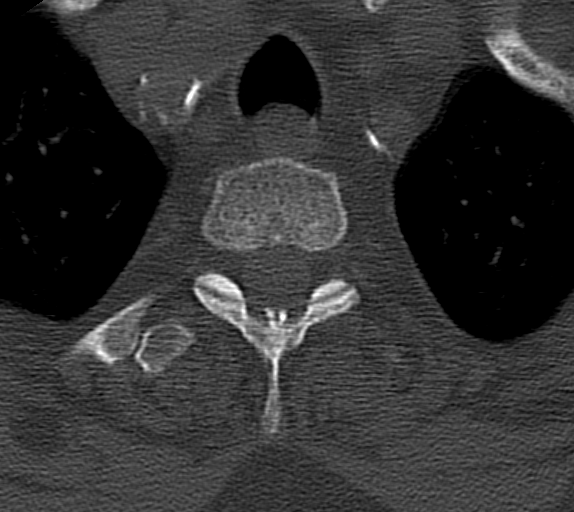
[im 23/112  bone]
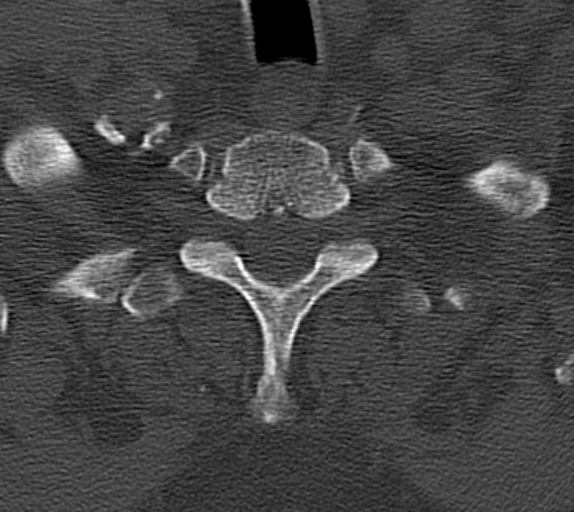
[im 34/112  bone]
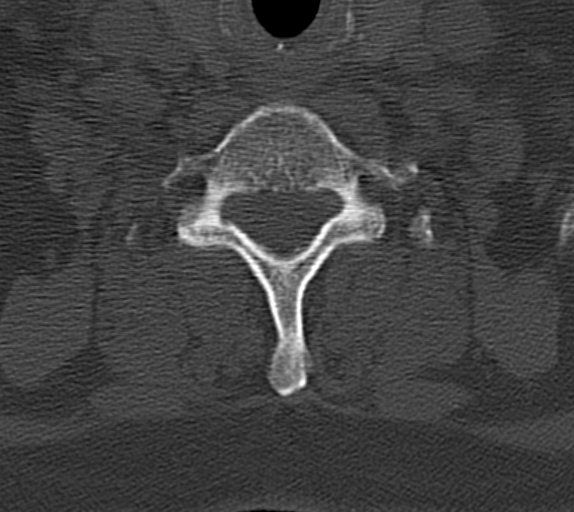

[13 of 47 positions shown; findings below may reference images not displayed]

FINDINGS: CT HEAD FINDINGS

Diffusely enlarged ventricles and subarachnoid spaces. Patchy white
matter low density in both cerebral hemispheres. No skull fracture,
intracranial hemorrhage or paranasal sinus air-fluid levels.

CT CERVICAL SPINE FINDINGS

Mild multilevel degenerative changes. No prevertebral soft tissue
swelling, fractures or subluxations. Atheromatous arterial
calcifications, including both carotid arteries.
IMPRESSION: 1. No skull fracture or intracranial hemorrhage.
2. No cervical spine fracture or subluxation.
3. Mild diffuse cerebral atrophy and minimal chronic small vessel
white matter ischemic changes in both cerebral hemispheres.
4. Mild cervical spine degenerative changes.
5. Atheromatous arterial calcifications, including the carotid
arteries.

## 2014-04-23 IMAGING — CR DG CHEST 2V
2 series · 2 of 2 positions shown · non-contrast
Comparison: Prior radiograph from [DATE]

CLINICAL DATA: FLANK PAIN

EXAM:
CHEST  2 VIEW

[view not recorded (1 of 2)]
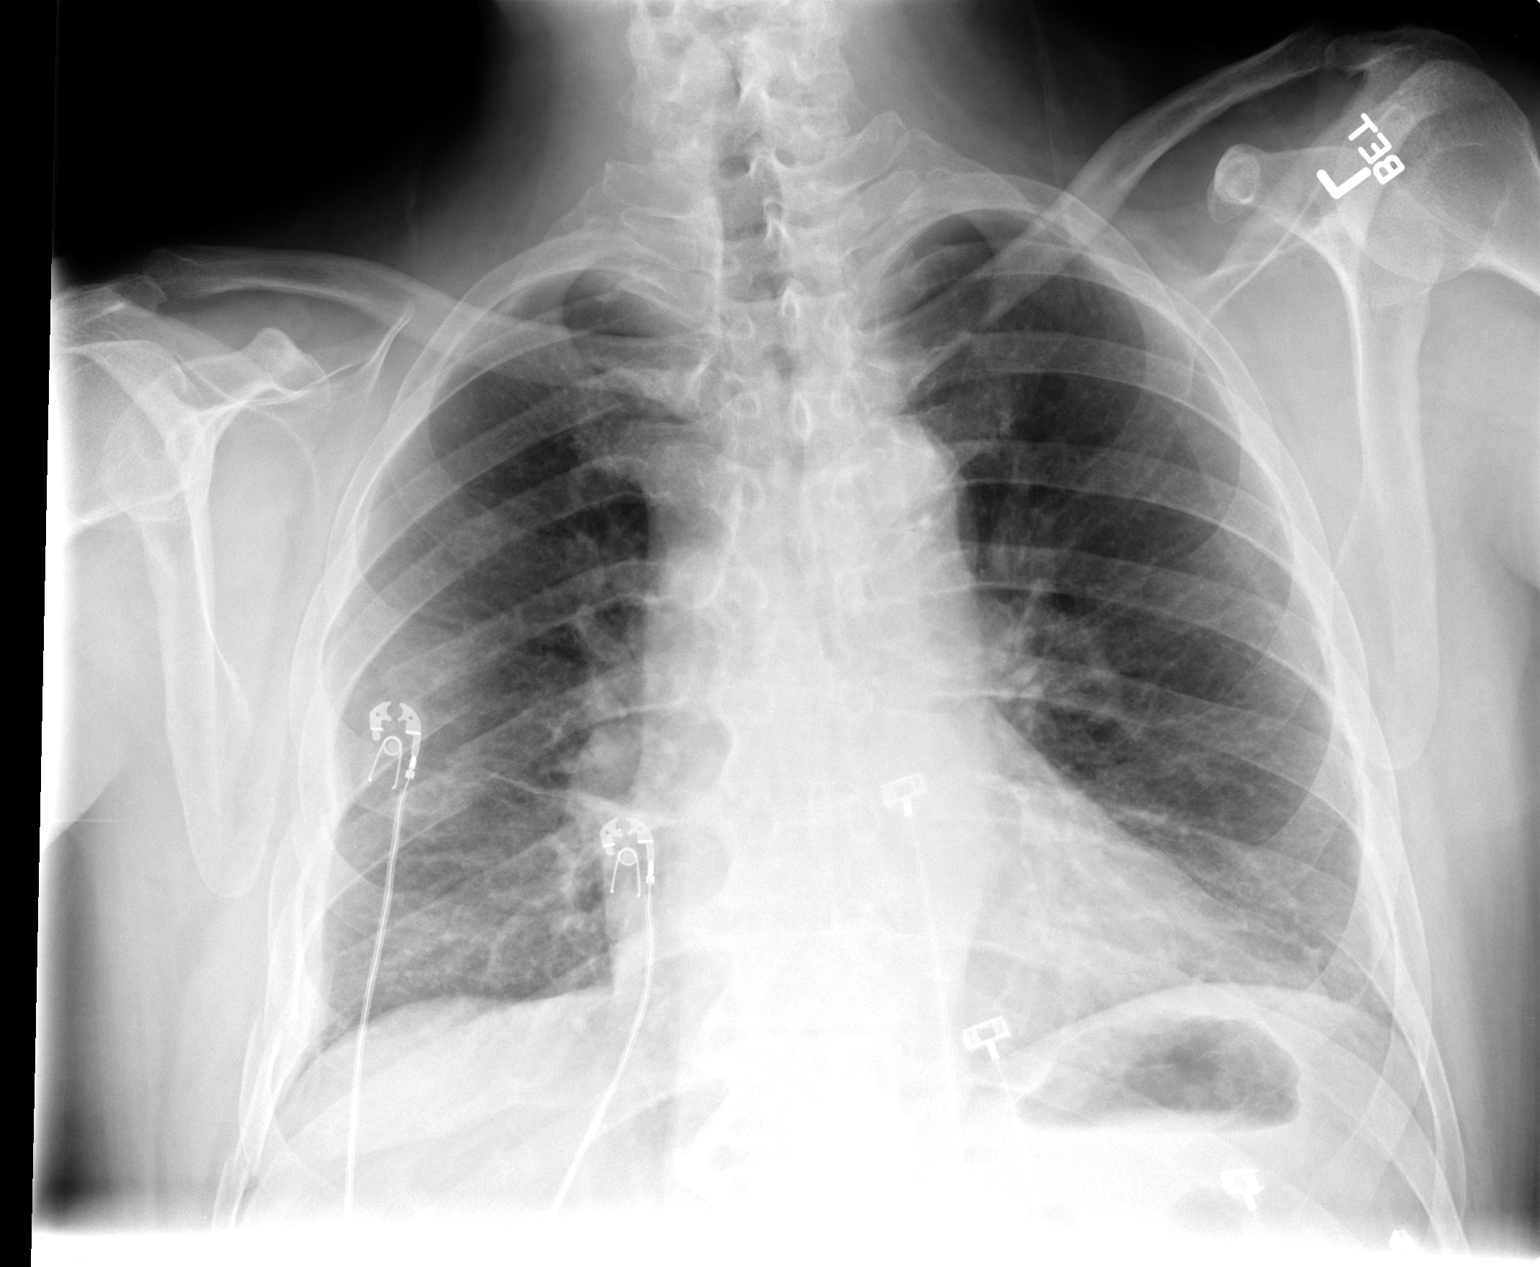

[view not recorded (2 of 2)]
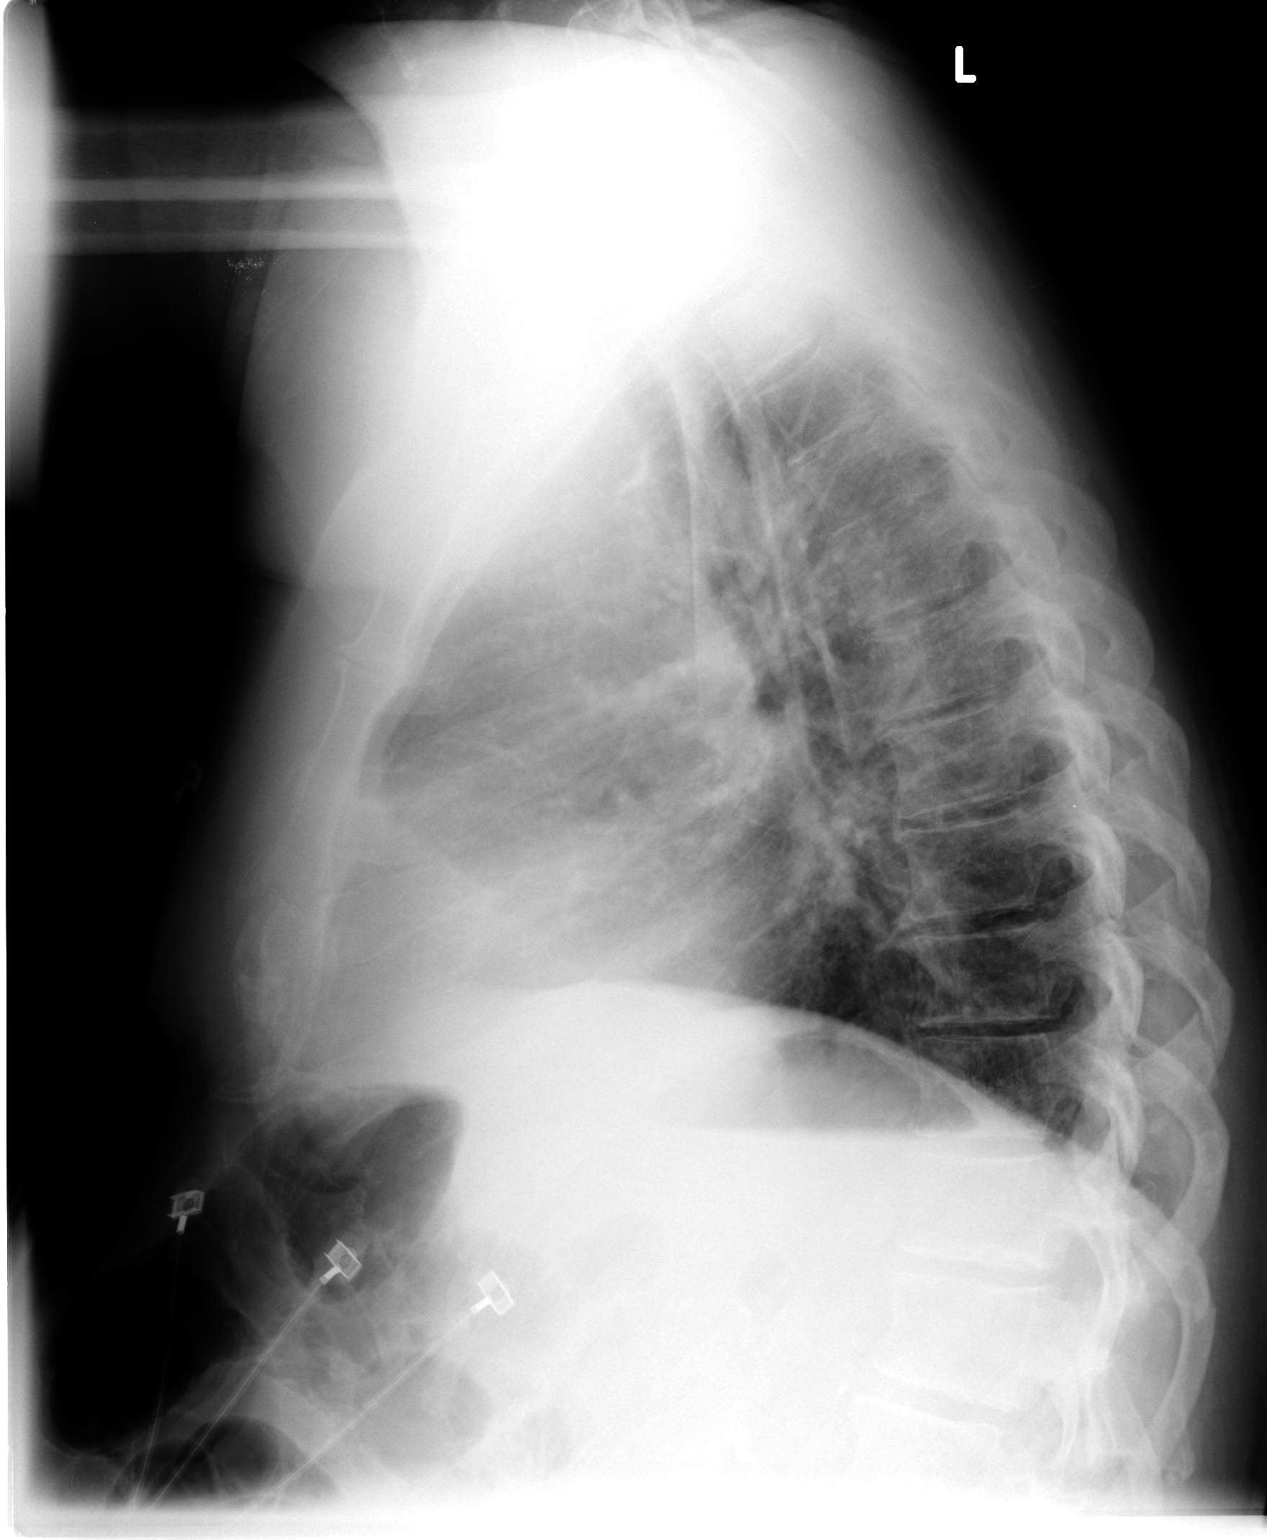

[2 of 2 positions shown; findings below may reference images not displayed]

FINDINGS: The cardiac and mediastinal silhouettes are stable in size and
contour, and remain within normal limits.

The lungs are normally inflated. Linear right perihilar opacities
most consistent with atelectasis and/ or scarring. Minimal left
basilar atelectasis present. No airspace consolidation, pleural
effusion, or pulmonary edema is identified. There is no
pneumothorax.

No acute osseous abnormality identified.
IMPRESSION: No active cardiopulmonary disease.

## 2014-04-23 MED ORDER — ENOXAPARIN SODIUM 40 MG/0.4ML ~~LOC~~ SOLN
40.0000 mg | SUBCUTANEOUS | Status: DC
  Administered 2014-04-23 – 2014-04-26 (×4): 40 mg via SUBCUTANEOUS
  Filled 2014-04-23 (×4): qty 0.4

## 2014-04-23 MED ORDER — SODIUM CHLORIDE 0.9 % IV SOLN
INTRAVENOUS | Status: DC
  Administered 2014-04-23 – 2014-04-24 (×3): via INTRAVENOUS

## 2014-04-23 MED ORDER — METOPROLOL TARTRATE 50 MG PO TABS
50.0000 mg | ORAL_TABLET | Freq: Two times a day (BID) | ORAL | Status: DC
  Administered 2014-04-23 – 2014-04-26 (×6): 50 mg via ORAL
  Filled 2014-04-23 (×6): qty 1

## 2014-04-23 MED ORDER — HYDROMORPHONE HCL PF 1 MG/ML IJ SOLN
1.0000 mg | Freq: Once | INTRAMUSCULAR | Status: AC
  Administered 2014-04-23: 1 mg via INTRAVENOUS
  Filled 2014-04-23: qty 1

## 2014-04-23 MED ORDER — SODIUM CHLORIDE 0.9 % IV SOLN: 250.0000 mL | INTRAVENOUS | Status: DC | PRN

## 2014-04-23 MED ORDER — ONDANSETRON HCL 4 MG/2ML IJ SOLN: 4.0000 mg | Freq: Four times a day (QID) | INTRAMUSCULAR | Status: DC | PRN

## 2014-04-23 MED ORDER — FOLIC ACID 1 MG PO TABS
1.0000 mg | ORAL_TABLET | Freq: Every day | ORAL | Status: DC
  Administered 2014-04-23 – 2014-04-26 (×4): 1 mg via ORAL
  Filled 2014-04-23 (×5): qty 1

## 2014-04-23 MED ORDER — METOPROLOL TARTRATE 25 MG PO TABS
25.0000 mg | ORAL_TABLET | Freq: Two times a day (BID) | ORAL | Status: DC
Start: 2014-04-23 — End: 2014-04-23
  Administered 2014-04-23: 25 mg via ORAL
  Filled 2014-04-23 (×2): qty 1

## 2014-04-23 MED ORDER — OXYCODONE HCL 5 MG PO TABS
5.0000 mg | ORAL_TABLET | ORAL | Status: DC | PRN
  Administered 2014-04-23 – 2014-04-26 (×14): 5 mg via ORAL
  Filled 2014-04-23 (×14): qty 1

## 2014-04-23 MED ORDER — HYDRALAZINE HCL 20 MG/ML IJ SOLN: 10.0000 mg | Freq: Four times a day (QID) | INTRAMUSCULAR | Status: DC | PRN

## 2014-04-23 MED ORDER — METHOCARBAMOL 500 MG PO TABS
750.0000 mg | ORAL_TABLET | Freq: Three times a day (TID) | ORAL | Status: DC
  Administered 2014-04-23 – 2014-04-26 (×10): 750 mg via ORAL
  Filled 2014-04-23 (×11): qty 2

## 2014-04-23 MED ORDER — SODIUM CHLORIDE 0.9 % IJ SOLN
3.0000 mL | INTRAMUSCULAR | Status: DC | PRN
Start: 2014-04-23 — End: 2014-04-26

## 2014-04-23 MED ORDER — LORAZEPAM 1 MG PO TABS: 1.0000 mg | ORAL_TABLET | Freq: Four times a day (QID) | ORAL | Status: AC | PRN

## 2014-04-23 MED ORDER — VITAMIN B-1 100 MG PO TABS
100.0000 mg | ORAL_TABLET | Freq: Every day | ORAL | Status: DC
  Administered 2014-04-23 – 2014-04-26 (×4): 100 mg via ORAL
  Filled 2014-04-23 (×5): qty 1

## 2014-04-23 MED ORDER — IOHEXOL 300 MG/ML  SOLN
100.0000 mL | Freq: Once | INTRAMUSCULAR | Status: AC | PRN
  Administered 2014-04-23: 100 mL via INTRAVENOUS

## 2014-04-23 MED ORDER — THIAMINE HCL 100 MG/ML IJ SOLN
100.0000 mg | Freq: Every day | INTRAMUSCULAR | Status: DC
  Filled 2014-04-23: qty 2

## 2014-04-23 MED ORDER — DEXTROSE 5 % IV SOLN
1.0000 g | INTRAVENOUS | Status: DC
  Administered 2014-04-23 – 2014-04-25 (×3): 1 g via INTRAVENOUS
  Filled 2014-04-23 (×5): qty 10

## 2014-04-23 MED ORDER — HYDROMORPHONE HCL PF 1 MG/ML IJ SOLN
1.0000 mg | INTRAMUSCULAR | Status: DC | PRN
Start: 2014-04-23 — End: 2014-04-26
  Administered 2014-04-23 – 2014-04-25 (×12): 1 mg via INTRAVENOUS
  Filled 2014-04-23 (×12): qty 1

## 2014-04-23 MED ORDER — ONDANSETRON HCL 4 MG PO TABS: 4.0000 mg | ORAL_TABLET | Freq: Four times a day (QID) | ORAL | Status: DC | PRN

## 2014-04-23 MED ORDER — METOPROLOL TARTRATE 25 MG PO TABS: 12.5000 mg | ORAL_TABLET | Freq: Two times a day (BID) | ORAL | Status: DC

## 2014-04-23 MED ORDER — ADULT MULTIVITAMIN W/MINERALS CH
1.0000 | ORAL_TABLET | Freq: Every day | ORAL | Status: DC
  Administered 2014-04-23 – 2014-04-26 (×4): 1 via ORAL
  Filled 2014-04-23 (×4): qty 1

## 2014-04-23 MED ORDER — SODIUM CHLORIDE 0.9 % IJ SOLN
3.0000 mL | Freq: Two times a day (BID) | INTRAMUSCULAR | Status: DC
  Administered 2014-04-24 – 2014-04-26 (×4): 3 mL via INTRAVENOUS

## 2014-04-23 MED ORDER — DEXTROSE 5 % IV SOLN
INTRAVENOUS | Status: AC
  Filled 2014-04-23: qty 10

## 2014-04-23 MED ORDER — LORAZEPAM 2 MG/ML IJ SOLN: 1.0000 mg | Freq: Four times a day (QID) | INTRAMUSCULAR | Status: AC | PRN

## 2014-04-23 NOTE — H&P (Addendum)
PCP:   Tereso Newcomer, PA-C   Chief Complaint:  Fall  HPI: 63 year old male with a history of hypertension, coronary artery disease, alcohol abuse, chronic hyponatremia who comes to the ED after patient*fell at home. Patient drinks alcohol almost everyday, he consumed large amount of alcohol tonight and felt dizzy causing him to fall. He is unsure about her loss of consciousness. Does complain of dizziness. Patient called for help and his brother arrived who stays with him. Patient complained of right-sided chest pain after the fall. He denies hitting his head. In the ED all the imaging studies were negative except for a CT chest which showed multiple displaced right rib fractures no pneumothorax. Fatty infiltration of the liver   Allergies:   Allergies  Allergen Reactions  . Amlodipine Besylate     REACTION: Feet swell  . Penicillins      History reviewed. No pertinent past medical history.  History reviewed. No pertinent past surgical history.  Prior to Admission medications   Not on File    Social History:  reports that he has never smoked. He does not have any smokeless tobacco history on file. His alcohol and drug histories are not on file.  History reviewed. No pertinent family history.   All the positives are listed in BOLD  Review of Systems:  HEENT: Headache, blurred vision, runny nose, sore throat Neck: Hypothyroidism, hyperthyroidism,,lymphadenopathy Chest : Shortness of breath, history of COPD, Asthma Heart : Chest pain, history of coronary arterey disease GI:  Nausea, vomiting, diarrhea, constipation, GERD GU: Dysuria, urgency, frequency of urination, hematuria Neuro: Stroke, seizures, syncope Psych: Depression, anxiety, hallucinations   Physical Exam: Blood pressure 132/71, pulse 86, temperature 97.8 F (36.6 C), temperature source Oral, resp. rate 16, height  (1.651 m), weight 90.719 kg (200 lb), SpO2 98.00%. Constitutional:   Patient is a  well-developed and well-nourished male in no acute distress and cooperative with exam. Head: Normocephalic and atraumatic Mouth: Mucus membranes moist Eyes: PERRL, EOMI, conjunctivae normal Neck: Supple, No Thyromegaly Cardiovascular: RRR, S1 normal, S2 normal Pulmonary/Chest: CTAB, no wheezes, rales, or rhonchi Abdominal: Soft. Non-tender, non-distended, bowel sounds are normal, no masses, organomegaly, or guarding present.  Neurological: A&O x3, Strenght is normal and symmetric bilaterally, cranial nerve II-XII are grossly intact, no focal motor deficit, sensory intact to light touch bilaterally.  Extremities : No Cyanosis, Clubbing or Edema  Labs on Admission:  Basic Metabolic Panel:  Recent Labs Lab 04/22/14 2320  NA 121*  K 4.0  CL 84*  CO2 17*  GLUCOSE 135*  BUN 4*  CREATININE 0.80  CALCIUM 8.0*   Liver Function Tests:  Recent Labs Lab 04/22/14 2320  AST 197*  ALT 38  ALKPHOS 106  BILITOT 0.7  PROT 7.8  ALBUMIN 2.9*    Recent Labs Lab 04/22/14 2320  LIPASE 41   No results found for this basename: AMMONIA,  in the last 168 hours CBC:  Recent Labs Lab 04/22/14 2320  WBC 12.4*  NEUTROABS 7.9*  HGB 11.6*  HCT 34.0*  MCV 92.9  PLT 239   Cardiac Enzymes: No results found for this basename: CKTOTAL, CKMB, CKMBINDEX, TROPONINI,  in the last 168 hours  BNP (last 3 results) No results found for this basename: PROBNP,  in the last 8760 hours CBG: No results found for this basename: GLUCAP,  in the last 168 hours  Radiological Exams on Admission: Dg Chest 2 View  04/23/2014   CLINICAL DATA:  FLANK PAIN  EXAM: CHEST  2 VIEW  COMPARISON:  Prior radiograph from 12/10/2009  FINDINGS: The cardiac and mediastinal silhouettes are stable in size and contour, and remain within normal limits.  The lungs are normally inflated. Linear right perihilar opacities most consistent with atelectasis and/ or scarring. Minimal left basilar atelectasis present. No airspace  consolidation, pleural effusion, or pulmonary edema is identified. There is no pneumothorax.  No acute osseous abnormality identified.  IMPRESSION: No active cardiopulmonary disease.   Electronically Signed   By: Rise Mu M.D.   On: 04/23/2014 00:41   Ct Head Wo Contrast  04/23/2014   CLINICAL DATA:  Loss of consciousness following a fall.  EXAM: CT HEAD WITHOUT CONTRAST  CT CERVICAL SPINE WITHOUT CONTRAST  TECHNIQUE: Multidetector CT imaging of the head and cervical spine was performed following the standard protocol without intravenous contrast. Multiplanar CT image reconstructions of the cervical spine were also generated.  COMPARISON:  None.  FINDINGS: CT HEAD FINDINGS  Diffusely enlarged ventricles and subarachnoid spaces. Patchy white matter low density in both cerebral hemispheres. No skull fracture, intracranial hemorrhage or paranasal sinus air-fluid levels.  CT CERVICAL SPINE FINDINGS  Mild multilevel degenerative changes. No prevertebral soft tissue swelling, fractures or subluxations. Atheromatous arterial calcifications, including both carotid arteries.  IMPRESSION: 1. No skull fracture or intracranial hemorrhage. 2. No cervical spine fracture or subluxation. 3. Mild diffuse cerebral atrophy and minimal chronic small vessel white matter ischemic changes in both cerebral hemispheres. 4. Mild cervical spine degenerative changes. 5. Atheromatous arterial calcifications, including the carotid arteries.   Electronically Signed   By: Gordan Payment M.D.   On: 04/23/2014 01:12   Ct Chest W Contrast  04/23/2014   CLINICAL DATA:  Trauma. Fell after consuming alcohol. Pain in the right upper quadrant and right axillary ribs.  EXAM: CT CHEST, ABDOMEN, AND PELVIS WITH CONTRAST  TECHNIQUE: Multidetector CT imaging of the chest, abdomen and pelvis was performed following the standard protocol during bolus administration of intravenous contrast.  CONTRAST:  OMNIPAQUE IOHEXOL 300 MG/ML  SOLN   COMPARISON:  CT abdomen and pelvis 03/08/2010  FINDINGS: CT CHEST FINDINGS  Normal heart size. Diffuse Coronary artery calcifications. Calcification of aorta. No aneurysm. Great vessel origins appear patent. No significant lymphadenopathy in the chest. Esophagus is decompressed. No abnormal mediastinal fluid collections.  Evaluation of lungs is limited due to motion artifact. There appears to be patchy ground-glass change which may represent atelectasis or contusion. No pneumothorax. No pleural effusion. No focal consolidation.  CT ABDOMEN AND PELVIS FINDINGS  Diffuse fatty infiltration of the liver. The gallbladder, pancreas, adrenal glands, inferior vena cava, and retroperitoneal lymph nodes are unremarkable. Diffuse calcification of the abdominal aorta and superior mesenteric artery. No aneurysm. Calcification of the iliac arteries suggests evidence of stenosis. There is a cyst in the lower pole of the right kidney. No hydronephrosis in either kidney. No contrast extravasation. 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 Stomach, small bowel, and colon are not abnormally distended. No free air or free fluid in the abdomen. No abnormal mesenteric or retroperitoneal fluid collections.  Pelvis: Prostate gland is not enlarged. Fat in the left inguinal canal. Bladder wall is not thickened. There is a small right posterior bladder diverticulum. Scattered diverticula in the sigmoid colon without diverticulitis. Appendix is not identified. No free or loculated pelvic fluid collections.  Bones: Displaced fractures of the right posterior lateral fifth, sixth, seventh, eighth, ninth, ribs. Left ribs are nondisplaced. Visualized clavicles, shoulders, and sternum appear intact. New normal alignment of the thoracic  and lumbar spine. Degenerative changes present. No vertebral compression deformities. The sacrum, pelvis, and hips appear intact.  IMPRESSION: Multiple displaced right rib fractures. No pneumothorax. Possible patchy  parenchymal contusions in the right lung. No acute posttraumatic changes demonstrated in the abdomen or pelvis. No evidence of solid organ injury or bowel perforation. Postoperative splenectomy. Fatty infiltration of the liver. Extensive vascular calcifications in the abdomen.   Electronically Signed   By: Burman Nieves M.D.   On: 04/23/2014 01:15   Ct Cervical Spine Wo Contrast  04/23/2014   CLINICAL DATA:  Loss of consciousness following a fall.  EXAM: CT HEAD WITHOUT CONTRAST  CT CERVICAL SPINE WITHOUT CONTRAST  TECHNIQUE: Multidetector CT imaging of the head and cervical spine was performed following the standard protocol without intravenous contrast. Multiplanar CT image reconstructions of the cervical spine were also generated.  COMPARISON:  None.  FINDINGS: CT HEAD FINDINGS  Diffusely enlarged ventricles and subarachnoid spaces. Patchy white matter low density in both cerebral hemispheres. No skull fracture, intracranial hemorrhage or paranasal sinus air-fluid levels.  CT CERVICAL SPINE FINDINGS  Mild multilevel degenerative changes. No prevertebral soft tissue swelling, fractures or subluxations. Atheromatous arterial calcifications, including both carotid arteries.  IMPRESSION: 1. No skull fracture or intracranial hemorrhage. 2. No cervical spine fracture or subluxation. 3. Mild diffuse cerebral atrophy and minimal chronic small vessel white matter ischemic changes in both cerebral hemispheres. 4. Mild cervical spine degenerative changes. 5. Atheromatous arterial calcifications, including the carotid arteries.   Electronically Signed   By: Gordan Payment M.D.   On: 04/23/2014 01:12   Ct Abdomen W Contrast  04/23/2014   CLINICAL DATA:  Trauma. Fell after consuming alcohol. Pain in the right upper quadrant and right axillary ribs.  EXAM: CT CHEST, ABDOMEN, AND PELVIS WITH CONTRAST  TECHNIQUE: Multidetector CT imaging of the chest, abdomen and pelvis was performed following the standard protocol during  bolus administration of intravenous contrast.  CONTRAST:  OMNIPAQUE IOHEXOL 300 MG/ML  SOLN  COMPARISON:  CT abdomen and pelvis 03/08/2010  FINDINGS: CT CHEST FINDINGS  Normal heart size. Diffuse Coronary artery calcifications. Calcification of aorta. No aneurysm. Great vessel origins appear patent. No significant lymphadenopathy in the chest. Esophagus is decompressed. No abnormal mediastinal fluid collections.  Evaluation of lungs is limited due to motion artifact. There appears to be patchy ground-glass change which may represent atelectasis or contusion. No pneumothorax. No pleural effusion. No focal consolidation.  CT ABDOMEN AND PELVIS FINDINGS  Diffuse fatty infiltration of the liver. The gallbladder, pancreas, adrenal glands, inferior vena cava, and retroperitoneal lymph nodes are unremarkable. Diffuse calcification of the abdominal aorta and superior mesenteric artery. No aneurysm. Calcification of the iliac arteries suggests evidence of stenosis. There is a cyst in the lower pole of the right kidney. No hydronephrosis in either kidney. No contrast extravasation. 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 Stomach, small bowel, and colon are not abnormally distended. No free air or free fluid in the abdomen. No abnormal mesenteric or retroperitoneal fluid collections.  Pelvis: Prostate gland is not enlarged. Fat in the left inguinal canal. Bladder wall is not thickened. There is a small right posterior bladder diverticulum. Scattered diverticula in the sigmoid colon without diverticulitis. Appendix is not identified. No free or loculated pelvic fluid collections.  Bones: Displaced fractures of the right posterior lateral fifth, sixth, seventh, eighth, ninth, ribs. Left ribs are nondisplaced. Visualized clavicles, shoulders, and sternum appear intact. New normal alignment of the thoracic and lumbar spine. Degenerative changes present. No vertebral  compression deformities. The sacrum, pelvis, and hips  appear intact.  IMPRESSION: Multiple displaced right rib fractures. No pneumothorax. Possible patchy parenchymal contusions in the right lung. No acute posttraumatic changes demonstrated in the abdomen or pelvis. No evidence of solid organ injury or bowel perforation. Postoperative splenectomy. Fatty infiltration of the liver. Extensive vascular calcifications in the abdomen.   Electronically Signed   By: Burman Nieves M.D.   On: 04/23/2014 01:15    EKG: Independently reviewed. Normal sinus rhythm   Assessment/Plan Principal Problem:   Rib fractures Active Problems:   ABUSE, ALCOHOL, UNSPECIFIED   HYPERTENSION   CORONARY ARTERY DISEASE   Hyponatremia  Multiple rib fractures Patient will be admitted to the hospital for pain management. We'll start Dilaudid 1 mg every 4 hours when necessary.  Hyponatremia Patient has chronic hyponatremia today's sodium is 121. His baseline sodium runs between 120-128. Patient doesn't drink beer most of the time, we will obtain serum and urine osmolality. Put the patient on fluid restriction 1200 mL per day. Will follow BMP in a.m.  Hypertension Patient is unsure of the dose of metoprolol. But takes it twice a day. Restart 25 mg metoprolol twice daily. His brother will check the Does and let the nurse know in a.m.  Alcohol abuse Patient drinks alcohol almost everyday. CT abdomen pelvis shows fatty infiltration of the liver.  ? UTI Patient has abnormal UA, white count is mildly elevated to 12,000. We'll start the patient on IV Rocephin 1 g daily. We'll obtain urine culture  AG Metabolic acidosis ? Alcoholic ketoacidosis, will check lactic acid level. Will start gentle IV hydration with normal saline.  Code status: Patient is full code  Family discussion: Admission, patients condition and plan of care including tests being ordered have been discussed with the patient and his brother at bedside* who indicate understanding and agree with the plan and  Code Status.   Time Spent on Admission: 60 minutes  LAMA,GAGAN S [facility] Pager: [phone] 04/23/2014, 2:25 AM  If 7PM-7AM, please contact night-coverage  [email or web address]  Password [email or web address]

## 2014-04-23 NOTE — Progress Notes (Signed)
Patient ID: Billy Stone  male  ZOX:[id number]    DOB: 08-30-1950    DOA: 04/22/2014  PCP: Tereso Newcomer, PA-C  Assessment/Plan: Principal Problem:   Rib fractures with acute pain, pulmonary contusion: Patient apparently consumed large amount of alcohol, felt dizzy and fell, CT chest showed multiple displaced right rib fractures, no pneumothorax, patchy parenchymal contusion in the right lung - Placed on pain control with oral oxycodone and IV Dilaudid, Robaxin, incentive spirometry - PT OT evaluation, lives at home with his brother  Active Problems: UTI/lower urinary tract infection - Follow urine culture and sensitivities, continue IV Rocephin    ABUSE, ALCOHOL, UNSPECIFIED - Consultation strongly on alcohol cessation, for now placed on CIWA protocol - LFTs elevated with alcohol abuse per    HYPERTENSION: Borderline hypotension - BP somewhat uncontrolled, continue IV fluid hydration, placed on as needed hydralazine with parameters    CORONARY ARTERY DISEASE    Hyponatremia: Secondary to dehydration with lactic acidosis - Continue IV fluid hydration, improving  Hyperglycemia - Will check hemoglobin A1c  DVT Prophylaxis: Lovenox  Code Status: Full code  Family Communication:  Disposition:  Consultants:  None  Procedures:  None  Antibiotics:  IV Rocephin    Subjective: Patient seen and examined complaint of intractable pain in the right side chest from the rib fractures  Objective: Weight change:   Intake/Output Summary (Last 24 hours) at 04/23/14 0849 Last data filed at 04/23/14 0219  Gross per 24 hour  Intake      0 ml  Output    114 ml  Net   -114 ml   Blood pressure 161/75, pulse 75, temperature 97.9 F (36.6 C), temperature source Oral, resp. rate 18, height  (1.651 m), weight 90.719 kg (200 lb), SpO2 98.00%.  Physical Exam: General: Alert and awake, oriented x3, not in any acute distress. CVS: S1-S2 clear, no murmur rubs or  gallops Chest: clear to auscultation bilaterally, no wheezing, rales or rhonchi, chest wall tenderness Abdomen: soft nontender, nondistended, normal bowel sounds  Extremities: no cyanosis, clubbing or edema noted bilaterally Neuro: Cranial nerves II-XII intact, no focal neurological deficits  Lab Results: Basic Metabolic Panel:  Recent Labs Lab 04/22/14 2320 04/23/14 0614  NA 121* 124*  K 4.0 4.8  CL 84* 87*  CO2 17* 20  GLUCOSE 135* 120*  BUN 4* 4*  CREATININE 0.80 0.72  CALCIUM 8.0* 8.0*   Liver Function Tests:  Recent Labs Lab 04/22/14 2320 04/23/14 0614  AST 197* 178*  ALT 38 39  ALKPHOS 106 108  BILITOT 0.7 0.7  PROT 7.8 8.0  ALBUMIN 2.9* 3.0*    Recent Labs Lab 04/22/14 2320  LIPASE 41   No results found for this basename: AMMONIA,  in the last 168 hours CBC:  Recent Labs Lab 04/22/14 2320 04/23/14 0614  WBC 12.4* 11.4*  NEUTROABS 7.9*  --   HGB 11.6* 11.8*  HCT 34.0* 34.1*  MCV 92.9 93.7  PLT 239 193   Cardiac Enzymes: No results found for this basename: CKTOTAL, CKMB, CKMBINDEX, TROPONINI,  in the last 168 hours BNP: No components found with this basename: POCBNP,  CBG: No results found for this basename: GLUCAP,  in the last 168 hours   Micro Results: No results found for this or any previous visit (from the past 240 hour(s)).  Studies/Results: Dg Chest 2 View  04/23/2014   CLINICAL DATA:  FLANK PAIN  EXAM: CHEST  2 VIEW  COMPARISON:  Prior radiograph from 12/10/2009  FINDINGS: The cardiac and mediastinal silhouettes are stable in size and contour, and remain within normal limits.  The lungs are normally inflated. Linear right perihilar opacities most consistent with atelectasis and/ or scarring. Minimal left basilar atelectasis present. No airspace consolidation, pleural effusion, or pulmonary edema is identified. There is no pneumothorax.  No acute osseous abnormality identified.  IMPRESSION: No active cardiopulmonary disease.    Electronically Signed   By: Rise Mu M.D.   On: 04/23/2014 00:41   Ct Head Wo Contrast  04/23/2014   CLINICAL DATA:  Loss of consciousness following a fall.  EXAM: CT HEAD WITHOUT CONTRAST  CT CERVICAL SPINE WITHOUT CONTRAST  TECHNIQUE: Multidetector CT imaging of the head and cervical spine was performed following the standard protocol without intravenous contrast. Multiplanar CT image reconstructions of the cervical spine were also generated.  COMPARISON:  None.  FINDINGS: CT HEAD FINDINGS  Diffusely enlarged ventricles and subarachnoid spaces. Patchy white matter low density in both cerebral hemispheres. No skull fracture, intracranial hemorrhage or paranasal sinus air-fluid levels.  CT CERVICAL SPINE FINDINGS  Mild multilevel degenerative changes. No prevertebral soft tissue swelling, fractures or subluxations. Atheromatous arterial calcifications, including both carotid arteries.  IMPRESSION: 1. No skull fracture or intracranial hemorrhage. 2. No cervical spine fracture or subluxation. 3. Mild diffuse cerebral atrophy and minimal chronic small vessel white matter ischemic changes in both cerebral hemispheres. 4. Mild cervical spine degenerative changes. 5. Atheromatous arterial calcifications, including the carotid arteries.   Electronically Signed   By: Gordan Payment M.D.   On: 04/23/2014 01:12   Ct Chest W Contrast  04/23/2014   CLINICAL DATA:  Trauma. Fell after consuming alcohol. Pain in the right upper quadrant and right axillary ribs.  EXAM: CT CHEST, ABDOMEN, AND PELVIS WITH CONTRAST  TECHNIQUE: Multidetector CT imaging of the chest, abdomen and pelvis was performed following the standard protocol during bolus administration of intravenous contrast.  CONTRAST:  OMNIPAQUE IOHEXOL 300 MG/ML  SOLN  COMPARISON:  CT abdomen and pelvis 03/08/2010  FINDINGS: CT CHEST FINDINGS  Normal heart size. Diffuse Coronary artery calcifications. Calcification of aorta. No aneurysm. Great vessel  origins appear patent. No significant lymphadenopathy in the chest. Esophagus is decompressed. No abnormal mediastinal fluid collections.  Evaluation of lungs is limited due to motion artifact. There appears to be patchy ground-glass change which may represent atelectasis or contusion. No pneumothorax. No pleural effusion. No focal consolidation.  CT ABDOMEN AND PELVIS FINDINGS  Diffuse fatty infiltration of the liver. The gallbladder, pancreas, adrenal glands, inferior vena cava, and retroperitoneal lymph nodes are unremarkable. Diffuse calcification of the abdominal aorta and superior mesenteric artery. No aneurysm. Calcification of the iliac arteries suggests evidence of stenosis. There is a cyst in the lower pole of the right kidney. No hydronephrosis in either kidney. No contrast extravasation. 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 Stomach, small bowel, and colon are not abnormally distended. No free air or free fluid in the abdomen. No abnormal mesenteric or retroperitoneal fluid collections.  Pelvis: Prostate gland is not enlarged. Fat in the left inguinal canal. Bladder wall is not thickened. There is a small right posterior bladder diverticulum. Scattered diverticula in the sigmoid colon without diverticulitis. Appendix is not identified. No free or loculated pelvic fluid collections.  Bones: Displaced fractures of the right posterior lateral fifth, sixth, seventh, eighth, ninth, ribs. Left ribs are nondisplaced. Visualized clavicles, shoulders, and sternum appear intact. New normal alignment of the thoracic and lumbar spine. Degenerative changes present. No vertebral compression deformities.  The sacrum, pelvis, and hips appear intact.  IMPRESSION: Multiple displaced right rib fractures. No pneumothorax. Possible patchy parenchymal contusions in the right lung. No acute posttraumatic changes demonstrated in the abdomen or pelvis. No evidence of solid organ injury or bowel perforation. Postoperative  splenectomy. Fatty infiltration of the liver. Extensive vascular calcifications in the abdomen.   Electronically Signed   By: Burman Nieves M.D.   On: 04/23/2014 01:15   Ct Cervical Spine Wo Contrast  04/23/2014   CLINICAL DATA:  Loss of consciousness following a fall.  EXAM: CT HEAD WITHOUT CONTRAST  CT CERVICAL SPINE WITHOUT CONTRAST  TECHNIQUE: Multidetector CT imaging of the head and cervical spine was performed following the standard protocol without intravenous contrast. Multiplanar CT image reconstructions of the cervical spine were also generated.  COMPARISON:  None.  FINDINGS: CT HEAD FINDINGS  Diffusely enlarged ventricles and subarachnoid spaces. Patchy white matter low density in both cerebral hemispheres. No skull fracture, intracranial hemorrhage or paranasal sinus air-fluid levels.  CT CERVICAL SPINE FINDINGS  Mild multilevel degenerative changes. No prevertebral soft tissue swelling, fractures or subluxations. Atheromatous arterial calcifications, including both carotid arteries.  IMPRESSION: 1. No skull fracture or intracranial hemorrhage. 2. No cervical spine fracture or subluxation. 3. Mild diffuse cerebral atrophy and minimal chronic small vessel white matter ischemic changes in both cerebral hemispheres. 4. Mild cervical spine degenerative changes. 5. Atheromatous arterial calcifications, including the carotid arteries.   Electronically Signed   By: Gordan Payment M.D.   On: 04/23/2014 01:12   Ct Abdomen W Contrast  04/23/2014   CLINICAL DATA:  Trauma. Fell after consuming alcohol. Pain in the right upper quadrant and right axillary ribs.  EXAM: CT CHEST, ABDOMEN, AND PELVIS WITH CONTRAST  TECHNIQUE: Multidetector CT imaging of the chest, abdomen and pelvis was performed following the standard protocol during bolus administration of intravenous contrast.  CONTRAST:  OMNIPAQUE IOHEXOL 300 MG/ML  SOLN  COMPARISON:  CT abdomen and pelvis 03/08/2010  FINDINGS: CT CHEST FINDINGS  Normal  heart size. Diffuse Coronary artery calcifications. Calcification of aorta. No aneurysm. Great vessel origins appear patent. No significant lymphadenopathy in the chest. Esophagus is decompressed. No abnormal mediastinal fluid collections.  Evaluation of lungs is limited due to motion artifact. There appears to be patchy ground-glass change which may represent atelectasis or contusion. No pneumothorax. No pleural effusion. No focal consolidation.  CT ABDOMEN AND PELVIS FINDINGS  Diffuse fatty infiltration of the liver. The gallbladder, pancreas, adrenal glands, inferior vena cava, and retroperitoneal lymph nodes are unremarkable. Diffuse calcification of the abdominal aorta and superior mesenteric artery. No aneurysm. Calcification of the iliac arteries suggests evidence of stenosis. There is a cyst in the lower pole of the right kidney. No hydronephrosis in either kidney. No contrast extravasation. 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 Stomach, small bowel, and colon are not abnormally distended. No free air or free fluid in the abdomen. No abnormal mesenteric or retroperitoneal fluid collections.  Pelvis: Prostate gland is not enlarged. Fat in the left inguinal canal. Bladder wall is not thickened. There is a small right posterior bladder diverticulum. Scattered diverticula in the sigmoid colon without diverticulitis. Appendix is not identified. No free or loculated pelvic fluid collections.  Bones: Displaced fractures of the right posterior lateral fifth, sixth, seventh, eighth, ninth, ribs. Left ribs are nondisplaced. Visualized clavicles, shoulders, and sternum appear intact. New normal alignment of the thoracic and lumbar spine. Degenerative changes present. No vertebral compression deformities. The sacrum, pelvis, and hips appear intact.  IMPRESSION: Multiple displaced right rib fractures. No pneumothorax. Possible patchy parenchymal contusions in the right lung. No acute posttraumatic changes demonstrated  in the abdomen or pelvis. No evidence of solid organ injury or bowel perforation. Postoperative splenectomy. Fatty infiltration of the liver. Extensive vascular calcifications in the abdomen.   Electronically Signed   By: Burman Nieves M.D.   On: 04/23/2014 01:15    Medications: Scheduled Meds: . cefTRIAXone (ROCEPHIN)  IV  1 g Intravenous Q24H  . enoxaparin (LOVENOX) injection  40 mg Subcutaneous Q24H  . folic acid  1 mg Oral Daily  . methocarbamol  750 mg Oral TID  . metoprolol tartrate  25 mg Oral BID  . multivitamin with minerals  1 tablet Oral Daily  . sodium chloride  3 mL Intravenous Q12H  . thiamine  100 mg Oral Daily   Or  . thiamine  100 mg Intravenous Daily      LOS: 1 day   Teyana Pierron M.D. [facility] 04/23/2014, 8:49 AM Pager: [phone]  If 7PM-7AM, please contact night-coverage [email or web address] Password [email or web address]  **Disclaimer: This note was dictated with voice recognition software. Similar sounding words can inadvertently be transcribed and this note may contain transcription errors which may not have been corrected upon publication of note.**

## 2014-04-23 NOTE — Progress Notes (Signed)
Paged Dr Sharl Ma regarding Lopressor 25 mg due. BP 110/52 HR 80. Dr Sharl Ma ordered to hold tonight's dose. Assessment completed. Pt has bruises to right side of face (cheek) and right forearm and Abrasions to right arm and back. Pt rates pain 8/10 on pain scale and describes it as sharp pain. Pt now laying in bed at lowest position & call bell within reach. Bed alarm is on. Brother is at bedside. Will continue to monitor pt frequently throughout night.

## 2014-04-23 NOTE — ED Notes (Signed)
CRITICAL VALUE ALERT  Critical value received:  Sodium 121  Date of notification:  04/22/14  Time of notification:  2355  Critical value read back: yes  Nurse who received alert:  Juanita Laster, RN  MD notified:  Dr. Wilkie Aye  Time of notification: 2356

## 2014-04-24 LAB — CBC
HCT: 32 % — ABNORMAL LOW (ref 39.0–52.0)
HEMOGLOBIN: 10.9 g/dL — AB (ref 13.0–17.0)
MCH: 32.2 pg (ref 26.0–34.0)
MCHC: 34.1 g/dL (ref 30.0–36.0)
MCV: 94.7 fL (ref 78.0–100.0)
Platelets: 264 10*3/uL (ref 150–400)
RBC: 3.38 MIL/uL — AB (ref 4.22–5.81)
RDW: 17.4 % — ABNORMAL HIGH (ref 11.5–15.5)
WBC: 10.4 10*3/uL (ref 4.0–10.5)

## 2014-04-24 LAB — COMPREHENSIVE METABOLIC PANEL
ALT: 27 U/L (ref 0–53)
AST: 91 U/L — ABNORMAL HIGH (ref 0–37)
Albumin: 2.7 g/dL — ABNORMAL LOW (ref 3.5–5.2)
Alkaline Phosphatase: 94 U/L (ref 39–117)
Anion gap: 10 (ref 5–15)
BUN: 4 mg/dL — ABNORMAL LOW (ref 6–23)
CHLORIDE: 91 meq/L — AB (ref 96–112)
CO2: 24 meq/L (ref 19–32)
Calcium: 8.1 mg/dL — ABNORMAL LOW (ref 8.4–10.5)
Creatinine, Ser: 0.54 mg/dL (ref 0.50–1.35)
GFR calc non Af Amer: >90 mL/min (ref >90)
GLUCOSE: 125 mg/dL — AB (ref 70–99)
Potassium: 4.5 mEq/L (ref 3.7–5.3)
SODIUM: 125 meq/L — AB (ref 137–147)
Total Bilirubin: 1 mg/dL (ref 0.3–1.2)
Total Protein: 7.4 g/dL (ref 6.0–8.3)

## 2014-04-24 LAB — BASIC METABOLIC PANEL
Anion gap: 10 (ref 5–15)
BUN: 3 mg/dL — ABNORMAL LOW (ref 6–23)
CALCIUM: 7.8 mg/dL — AB (ref 8.4–10.5)
CHLORIDE: 88 meq/L — AB (ref 96–112)
CO2: 24 meq/L (ref 19–32)
CREATININE: 0.53 mg/dL (ref 0.50–1.35)
GFR calc Af Amer: >90 mL/min (ref >90)
GFR calc non Af Amer: >90 mL/min (ref >90)
Glucose, Bld: 143 mg/dL — ABNORMAL HIGH (ref 70–99)
Potassium: 4.1 mEq/L (ref 3.7–5.3)
Sodium: 122 mEq/L — ABNORMAL LOW (ref 137–147)

## 2014-04-24 LAB — OSMOLALITY: Osmolality: 258 mOsm/kg — ABNORMAL LOW (ref 275–300)

## 2014-04-24 LAB — SODIUM, URINE, RANDOM: Sodium, Ur: <20 mEq/L

## 2014-04-24 LAB — OSMOLALITY, URINE: OSMOLALITY UR: 271 mosm/kg — AB (ref 390–1090)

## 2014-04-24 MED ORDER — SODIUM CHLORIDE 0.9 % IV SOLN: INTRAVENOUS | Status: DC

## 2014-04-24 MED ORDER — IPRATROPIUM-ALBUTEROL 0.5-2.5 (3) MG/3ML IN SOLN
3.0000 mL | Freq: Once | RESPIRATORY_TRACT | Status: AC
  Administered 2014-04-24: 3 mL via RESPIRATORY_TRACT
  Filled 2014-04-24: qty 3

## 2014-04-24 MED ORDER — LORATADINE 10 MG PO TABS
10.0000 mg | ORAL_TABLET | Freq: Every day | ORAL | Status: DC
  Administered 2014-04-24 – 2014-04-26 (×3): 10 mg via ORAL
  Filled 2014-04-24 (×3): qty 1

## 2014-04-24 MED ORDER — SODIUM CHLORIDE 1 G PO TABS
1.0000 g | ORAL_TABLET | Freq: Three times a day (TID) | ORAL | Status: DC
  Administered 2014-04-24 – 2014-04-26 (×5): 1 g via ORAL
  Filled 2014-04-24 (×9): qty 1

## 2014-04-24 MED ORDER — PANTOPRAZOLE SODIUM 40 MG PO TBEC
40.0000 mg | DELAYED_RELEASE_TABLET | Freq: Every day | ORAL | Status: DC
  Administered 2014-04-24: 40 mg via ORAL
  Filled 2014-04-24: qty 1

## 2014-04-24 MED ORDER — ALUM & MAG HYDROXIDE-SIMETH 200-200-20 MG/5ML PO SUSP
30.0000 mL | Freq: Four times a day (QID) | ORAL | Status: DC | PRN
Start: 2014-04-24 — End: 2014-04-26
  Administered 2014-04-24 – 2014-04-25 (×3): 30 mL via ORAL
  Filled 2014-04-24 (×3): qty 30

## 2014-04-24 MED ORDER — LORATADINE 10 MG PO TABS: 10.0000 mg | ORAL_TABLET | Freq: Every day | ORAL | Status: DC

## 2014-04-24 MED ORDER — SODIUM CHLORIDE 1 G PO TABS: 1.0000 g | ORAL_TABLET | Freq: Two times a day (BID) | ORAL | Status: DC

## 2014-04-24 MED ORDER — CIPROFLOXACIN HCL 500 MG PO TABS: 500.0000 mg | ORAL_TABLET | Freq: Two times a day (BID) | ORAL | Status: DC

## 2014-04-24 MED ORDER — IPRATROPIUM-ALBUTEROL 0.5-2.5 (3) MG/3ML IN SOLN
3.0000 mL | Freq: Four times a day (QID) | RESPIRATORY_TRACT | Status: DC | PRN
  Administered 2014-04-24 (×2): 3 mL via RESPIRATORY_TRACT
  Filled 2014-04-24 (×2): qty 3

## 2014-04-24 MED ORDER — FLUTICASONE PROPIONATE 50 MCG/ACT NA SUSP: 1.0000 | Freq: Every day | NASAL | Status: DC

## 2014-04-24 MED ORDER — METHOCARBAMOL 750 MG PO TABS: 750.0000 mg | ORAL_TABLET | Freq: Three times a day (TID) | ORAL | Status: DC

## 2014-04-24 MED ORDER — OXYCODONE HCL 5 MG PO TABS: 5.0000 mg | ORAL_TABLET | ORAL | Status: DC | PRN

## 2014-04-24 MED ORDER — SODIUM CHLORIDE 1 G PO TABS
1.0000 g | ORAL_TABLET | Freq: Two times a day (BID) | ORAL | Status: DC
  Administered 2014-04-24: 1 g via ORAL
  Filled 2014-04-24 (×7): qty 1

## 2014-04-24 MED ORDER — FLUTICASONE PROPIONATE 50 MCG/ACT NA SUSP
1.0000 | Freq: Every day | NASAL | Status: DC
  Administered 2014-04-24 – 2014-04-26 (×3): 1 via NASAL
  Filled 2014-04-24: qty 16

## 2014-04-24 NOTE — Evaluation (Signed)
Physical Therapy Evaluation Patient Details Name: Billy Stone MRN: [id number] DOB: 1951-03-08 Today's Date: 04/24/2014   History of Present Illness  63 year old male with a history of hypertension, coronary artery disease, alcohol abuse, chronic hyponatremia who comes to the ED after patient*fell at home. Patient drinks alcohol almost everyday, he consumed large amount of alcohol tonight and felt dizzy causing him to fall. He is unsure about her loss of consciousness. Does complain of dizziness. Patient called for help and his brother arrived who stays with him. Patient complained of right-sided chest pain after the fall. He denies hitting his head.  CT ABDOMEN AND PELVIS FINDINGS  Diffuse fatty infiltration of the liver. The gallbladder, pancreas, adrenal glands, inferior vena cava, and retroperitoneal lymph nodes are unremarkable. Diffuse calcification of the abdominal aorta and superior mesenteric artery. No aneurysm. Calcification of the iliac arteries suggests evidence of stenosis. There is a cyst in the lower pole of the right kidney. No hydronephrosis in either kidney. No contrast extravasation. 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 Stomach, small bowel, and colon are not abnormally distended. No free air or free fluid in the abdomen. No abnormal mesenteric or retroperitoneal fluid collections.  Pelvis: Prostate gland is not enlarged. Fat in the left inguinal canal. Bladder wall is not thickened. There is a small right posterior bladder diverticulum. Scattered diverticula in the sigmoid colon without diverticulitis. Appendix is not identified. No free or loculated pelvic fluid collections.  Bones: Displaced fractures of the right posterior lateral fifth, sixth, seventh, eighth, ninth, ribs. Left ribs are nondisplaced. Visualized clavicles, shoulders, and sternum appear intact. New normal alignment of the thoracic and lumbar spine. Degenerative changes present. No vertebral compression deformities.  The sacrum, pelvis, and hips appear intact.  IMPRESSION: Multiple displaced right rib fractures. No pneumothorax. Possible patchy parenchymal contusions in the right lung. No acute posttraumatic changes demonstrated in the abdomen or pelvis. No evidence of solid organ injury or bowel perforation. Postoperative splenectomy. Fatty infiltration of the liver. Extensive vascular calcifications in the abdomen.   Clinical Impression  Pt is a 63 year old male who presents to physical therapy with Rt posterior lateral displaced 5th, 6th, 7th, 8th, and 9th ribs.  Pt reports complaints of Rt sided chest/ribcage pain with movement/breathing.  During evaluation, pt required mod assist for trunk for bed mobility skills secondary to pain, and supervision/min guard for transfers and gait.  Attempted standing transfer without AD at first, as pt does not normally use AD, though due to pain noted instability in standing.  Noted improved balance once transitioned to RW.  Recommend use of RW during functional activities; pt reports he may have access to a RW, though unsure at this time.  Pt reports he will have assist at home from his brother as needed, and no further PT services are recommended.  Pt to be d/c from acute PT services.      Follow Up Recommendations No PT follow up    Equipment Recommendations  Rolling walker with 5" wheels       Precautions / Restrictions Precautions Precautions: Fall Restrictions Weight Bearing Restrictions: No      Mobility  Bed Mobility Overal bed mobility: Needs Assistance Bed Mobility: Supine to Sit;Sit to Supine     Supine to sit: Min assist (for trunk) Sit to supine: Supervision      Transfers Overall transfer level: Needs assistance Equipment used: Rolling walker (2 wheeled) Transfers: Sit to/from Stand Sit to Stand: Supervision  Ambulation/Gait Ambulation/Gait assistance: Supervision;Min guard Ambulation Distance (Feet): 10 Feet Assistive  device: Rolling walker (2 wheeled) Gait Pattern/deviations: Step-through pattern   Gait velocity interpretation: Below normal speed for age/gender General Gait Details: 10' x2 to bathroom.      Balance Overall balance assessment: History of Falls (Fall which brought pt to hospital (alcohol involved))                                           Pertinent Vitals/Pain Pain Assessment: 0-10 Pain Score: 5  Pain Location: Chest area (Rt rib cage) Pain Intervention(s): Limited activity within patient's tolerance;Repositioned    Home Living Family/patient expects to be discharged to:: Private residence Living Arrangements: Other relatives (Lives with brother) Available Help at Discharge: Family;Available 24 hours/day Type of Home: House Home Access: Stairs to enter Entrance Stairs-Rails: None Entrance Stairs-Number of Steps: 2 Home Layout: One level Home Equipment: Cane - quad;Cane - single point Additional Comments: Tub shower    Prior Function Level of Independence: Independent                  Extremity/Trunk Assessment               Lower Extremity Assessment: Overall WFL for tasks assessed         Communication   Communication: No difficulties  Cognition Arousal/Alertness: Awake/alert Behavior During Therapy: WFL for tasks assessed/performed Overall Cognitive Status: Within Functional Limits for tasks assessed                       Assessment/Plan    PT Assessment Patent does not need any further PT services  PT Diagnosis     PT Problem List    PT Treatment Interventions     PT Goals (Current goals can be found in the Care Plan section) Acute Rehab PT Goals PT Goal Formulation: No goals set, d/c therapy    Frequency      End of Session Equipment Utilized During Treatment: Gait belt Activity Tolerance: Patient limited by pain Patient left: in bed;with call bell/phone within reach;with bed alarm set            Time: 1610-9604 PT Time Calculation (min): 23 min   Charges:   PT Evaluation $Initial PT Evaluation Tier I: 1 Procedure      Girtie Wiersma 04/24/2014, 9:37 AM

## 2014-04-24 NOTE — Progress Notes (Addendum)
Patient ID: Billy Stone  male  WUJ:[id number]    DOB: 15-Jan-1951    DOA: 04/22/2014  PCP: No primary provider on file.  Addendum:  Repeat Na down to 122 - awaiting s osm, ur osm, Una <20 - will DC IVF, patient takes Nacl tabs at home, will place back on them, place on fluid restriction - Recheck BMET in a.m.    RAI,RIPUDEEP M.D. [facility] 04/24/2014, 1:58 PM  Pager: [phone]      Assessment/Plan: Principal Problem:   Rib fractures with acute pain, pulmonary contusion: Patient apparently consumed large amount of alcohol, felt dizzy and fell, CT chest showed multiple displaced right rib fractures, no pneumothorax, patchy parenchymal contusion in the right lung - Pain improving, continue oral oxycodone and IV Dilaudid as needed, Robaxin, incentive spirometry - PT OT evaluation, lives at home with his brother, d/w case mgmt   Active Problems: Acute on chronic hyponatremia; initially secondary to dehydration with lactic acidosis, unknown baseline, patient reports that he has chronic hyponatremia, ?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    HYPERTENSION: - continue BB, hydralazine as needed     CORONARY ARTERY DISEASE  Hyperglycemia - hemoglobin A1c 5.4  DVT Prophylaxis: Lovenox  Code Status: Full code  Family Communication:  Disposition:  Consultants:  None  Procedures:  None  Antibiotics:  IV Rocephin    Subjective: Patient seen and examined, pain is improving, lungs clear but mouth breathing with ?wheezing    Objective: Weight change:   Intake/Output Summary (Last 24 hours) at 04/24/14  0813 Last data filed at 04/23/14 2127  Gross per 24 hour  Intake 1091.25 ml  Output   1600 ml  Net -508.75 ml   Blood pressure 147/66, pulse 62, temperature 98.4 F (36.9 C), temperature source Oral, resp. rate 20, height  (1.651 m), weight 90.719 kg (200 lb), SpO2 94.00%.  Physical Exam: General: Alert and awake, oriented x3, NAD CVS: S1-S2 clear, no murmur rubs or gallops Chest: CTAB Abdomen: soft nontender, nondistended, normal bowel sounds  Extremities: no cyanosis, clubbing or edema noted bilaterally Neuro: Cranial nerves II-XII intact, no focal neurological deficits  Lab Results: Basic Metabolic Panel:  Recent Labs Lab 04/23/14 0614 04/24/14 0628  NA 124* 125*  K 4.8 4.5  CL 87* 91*  CO2 20 24  GLUCOSE 120* 125*  BUN 4* 4*  CREATININE 0.72 0.54  CALCIUM 8.0* 8.1*   Liver Function Tests:  Recent Labs Lab 04/23/14 0614 04/24/14 0628  AST 178* 91*  ALT 39 27  ALKPHOS 108 94  BILITOT 0.7 1.0  PROT 8.0 7.4  ALBUMIN 3.0* 2.7*    Recent Labs Lab 04/22/14 2320  LIPASE 41   No results found for this basename: AMMONIA,  in the last 168 hours CBC:  Recent Labs Lab 04/22/14 2320 04/23/14 0614 04/24/14 0628  WBC 12.4* 11.4* 10.4  NEUTROABS 7.9*  --   --   HGB 11.6* 11.8* 10.9*  HCT 34.0* 34.1* 32.0*  MCV 92.9 93.7 94.7  PLT 239 193 264   Cardiac Enzymes: No results found for this basename: CKTOTAL, CKMB, CKMBINDEX, TROPONINI,  in the last  168 hours BNP: No components found with this basename: POCBNP,  CBG: No results found for this basename: GLUCAP,  in the last 168 hours   Micro Results: No results found for this or any previous visit (from the past 240 hour(s)).  Studies/Results: Dg Chest 2 View  04/23/2014   CLINICAL DATA:  FLANK PAIN  EXAM: CHEST  2 VIEW  COMPARISON:  Prior radiograph from 12/10/2009  FINDINGS: The cardiac and mediastinal silhouettes are stable in size and contour, and remain within normal limits.  The lungs are  normally inflated. Linear right perihilar opacities most consistent with atelectasis and/ or scarring. Minimal left basilar atelectasis present. No airspace consolidation, pleural effusion, or pulmonary edema is identified. There is no pneumothorax.  No acute osseous abnormality identified.  IMPRESSION: No active cardiopulmonary disease.   Electronically Signed   By: Rise Mu M.D.   On: 04/23/2014 00:41   Ct Head Wo Contrast  04/23/2014   CLINICAL DATA:  Loss of consciousness following a fall.  EXAM: CT HEAD WITHOUT CONTRAST  CT CERVICAL SPINE WITHOUT CONTRAST  TECHNIQUE: Multidetector CT imaging of the head and cervical spine was performed following the standard protocol without intravenous contrast. Multiplanar CT image reconstructions of the cervical spine were also generated.  COMPARISON:  None.  FINDINGS: CT HEAD FINDINGS  Diffusely enlarged ventricles and subarachnoid spaces. Patchy white matter low density in both cerebral hemispheres. No skull fracture, intracranial hemorrhage or paranasal sinus air-fluid levels.  CT CERVICAL SPINE FINDINGS  Mild multilevel degenerative changes. No prevertebral soft tissue swelling, fractures or subluxations. Atheromatous arterial calcifications, including both carotid arteries.  IMPRESSION: 1. No skull fracture or intracranial hemorrhage. 2. No cervical spine fracture or subluxation. 3. Mild diffuse cerebral atrophy and minimal chronic small vessel white matter ischemic changes in both cerebral hemispheres. 4. Mild cervical spine degenerative changes. 5. Atheromatous arterial calcifications, including the carotid arteries.   Electronically Signed   By: Gordan Payment M.D.   On: 04/23/2014 01:12   Ct Chest W Contrast  04/23/2014   CLINICAL DATA:  Trauma. Fell after consuming alcohol. Pain in the right upper quadrant and right axillary ribs.  EXAM: CT CHEST, ABDOMEN, AND PELVIS WITH CONTRAST  TECHNIQUE: Multidetector CT imaging of the chest, abdomen and pelvis  was performed following the standard protocol during bolus administration of intravenous contrast.  CONTRAST:  OMNIPAQUE IOHEXOL 300 MG/ML  SOLN  COMPARISON:  CT abdomen and pelvis 03/08/2010  FINDINGS: CT CHEST FINDINGS  Normal heart size. Diffuse Coronary artery calcifications. Calcification of aorta. No aneurysm. Great vessel origins appear patent. No significant lymphadenopathy in the chest. Esophagus is decompressed. No abnormal mediastinal fluid collections.  Evaluation of lungs is limited due to motion artifact. There appears to be patchy ground-glass change which may represent atelectasis or contusion. No pneumothorax. No pleural effusion. No focal consolidation.  CT ABDOMEN AND PELVIS FINDINGS  Diffuse fatty infiltration of the liver. The gallbladder, pancreas, adrenal glands, inferior vena cava, and retroperitoneal lymph nodes are unremarkable. Diffuse calcification of the abdominal aorta and superior mesenteric artery. No aneurysm. Calcification of the iliac arteries suggests evidence of stenosis. There is a cyst in the lower pole of the right kidney. No hydronephrosis in either kidney. No contrast extravasation. 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 Stomach, small bowel, and colon are not abnormally distended. No free air or free fluid in the abdomen. No abnormal mesenteric or retroperitoneal fluid collections.  Pelvis: Prostate gland is not enlarged. Fat in the left inguinal canal. Bladder wall  is not thickened. There is a small right posterior bladder diverticulum. Scattered diverticula in the sigmoid colon without diverticulitis. Appendix is not identified. No free or loculated pelvic fluid collections.  Bones: Displaced fractures of the right posterior lateral fifth, sixth, seventh, eighth, ninth, ribs. Left ribs are nondisplaced. Visualized clavicles, shoulders, and sternum appear intact. New normal alignment of the thoracic and lumbar spine. Degenerative changes present. No vertebral  compression deformities. The sacrum, pelvis, and hips appear intact.  IMPRESSION: Multiple displaced right rib fractures. No pneumothorax. Possible patchy parenchymal contusions in the right lung. No acute posttraumatic changes demonstrated in the abdomen or pelvis. No evidence of solid organ injury or bowel perforation. Postoperative splenectomy. Fatty infiltration of the liver. Extensive vascular calcifications in the abdomen.   Electronically Signed   By: Burman Nieves M.D.   On: 04/23/2014 01:15   Ct Cervical Spine Wo Contrast  04/23/2014   CLINICAL DATA:  Loss of consciousness following a fall.  EXAM: CT HEAD WITHOUT CONTRAST  CT CERVICAL SPINE WITHOUT CONTRAST  TECHNIQUE: Multidetector CT imaging of the head and cervical spine was performed following the standard protocol without intravenous contrast. Multiplanar CT image reconstructions of the cervical spine were also generated.  COMPARISON:  None.  FINDINGS: CT HEAD FINDINGS  Diffusely enlarged ventricles and subarachnoid spaces. Patchy white matter low density in both cerebral hemispheres. No skull fracture, intracranial hemorrhage or paranasal sinus air-fluid levels.  CT CERVICAL SPINE FINDINGS  Mild multilevel degenerative changes. No prevertebral soft tissue swelling, fractures or subluxations. Atheromatous arterial calcifications, including both carotid arteries.  IMPRESSION: 1. No skull fracture or intracranial hemorrhage. 2. No cervical spine fracture or subluxation. 3. Mild diffuse cerebral atrophy and minimal chronic small vessel white matter ischemic changes in both cerebral hemispheres. 4. Mild cervical spine degenerative changes. 5. Atheromatous arterial calcifications, including the carotid arteries.   Electronically Signed   By: Gordan Payment M.D.   On: 04/23/2014 01:12   Ct Abdomen W Contrast  04/23/2014   CLINICAL DATA:  Trauma. Fell after consuming alcohol. Pain in the right upper quadrant and right axillary ribs.  EXAM: CT CHEST,  ABDOMEN, AND PELVIS WITH CONTRAST  TECHNIQUE: Multidetector CT imaging of the chest, abdomen and pelvis was performed following the standard protocol during bolus administration of intravenous contrast.  CONTRAST:  OMNIPAQUE IOHEXOL 300 MG/ML  SOLN  COMPARISON:  CT abdomen and pelvis 03/08/2010  FINDINGS: CT CHEST FINDINGS  Normal heart size. Diffuse Coronary artery calcifications. Calcification of aorta. No aneurysm. Great vessel origins appear patent. No significant lymphadenopathy in the chest. Esophagus is decompressed. No abnormal mediastinal fluid collections.  Evaluation of lungs is limited due to motion artifact. There appears to be patchy ground-glass change which may represent atelectasis or contusion. No pneumothorax. No pleural effusion. No focal consolidation.  CT ABDOMEN AND PELVIS FINDINGS  Diffuse fatty infiltration of the liver. The gallbladder, pancreas, adrenal glands, inferior vena cava, and retroperitoneal lymph nodes are unremarkable. Diffuse calcification of the abdominal aorta and superior mesenteric artery. No aneurysm. Calcification of the iliac arteries suggests evidence of stenosis. There is a cyst in the lower pole of the right kidney. No hydronephrosis in either kidney. No contrast extravasation. 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 Stomach, small bowel, and colon are not abnormally distended. No free air or free fluid in the abdomen. No abnormal mesenteric or retroperitoneal fluid collections.  Pelvis: Prostate gland is not enlarged. Fat in the left inguinal canal. Bladder wall is not thickened. There is a small right  posterior bladder diverticulum. Scattered diverticula in the sigmoid colon without diverticulitis. Appendix is not identified. No free or loculated pelvic fluid collections.  Bones: Displaced fractures of the right posterior lateral fifth, sixth, seventh, eighth, ninth, ribs. Left ribs are nondisplaced. Visualized clavicles, shoulders, and sternum appear intact.  New normal alignment of the thoracic and lumbar spine. Degenerative changes present. No vertebral compression deformities. The sacrum, pelvis, and hips appear intact.  IMPRESSION: Multiple displaced right rib fractures. No pneumothorax. Possible patchy parenchymal contusions in the right lung. No acute posttraumatic changes demonstrated in the abdomen or pelvis. No evidence of solid organ injury or bowel perforation. Postoperative splenectomy. Fatty infiltration of the liver. Extensive vascular calcifications in the abdomen.   Electronically Signed   By: Burman Nieves M.D.   On: 04/23/2014 01:15    Medications: Scheduled Meds: . cefTRIAXone (ROCEPHIN)  IV  1 g Intravenous Q24H  . enoxaparin (LOVENOX) injection  40 mg Subcutaneous Q24H  . folic acid  1 mg Oral Daily  . methocarbamol  750 mg Oral TID  . metoprolol tartrate  50 mg Oral BID  . multivitamin with minerals  1 tablet Oral Daily  . sodium chloride  3 mL Intravenous Q12H  . thiamine  100 mg Oral Daily   Or  . thiamine  100 mg Intravenous Daily      LOS: 2 days   RAI,RIPUDEEP M.D. [facility] 04/24/2014, 8:13 AM Pager: [phone]  If 7PM-7AM, please contact night-coverage [email or web address] Password [email or web address]  **Disclaimer: This note was dictated with voice recognition software. Similar sounding words can inadvertently be transcribed and this note may contain transcription errors which may not have been corrected upon publication of note.**

## 2014-04-24 NOTE — Care Management Note (Addendum)
    Page 1 of 1   04/24/2014     12:46:20 PM CARE MANAGEMENT NOTE 04/24/2014  Patient:  Billy Stone, Billy Stone   Account Number:  [id number]  Date Initiated:  04/24/2014  Documentation initiated by:  CHILDRESS,JESSICA  Subjective/Objective Assessment:   Pt is from home, lives with brother and is independent with ADL's. Pt has a cane that he can use if needed but does not currently use it. Pt has no HH services or medication needs prior to admission.     Action/Plan:   Pt plans to discharge home with self care today. Pt has no CM needs at this time.   Anticipated DC Date:  04/24/2014   Anticipated DC Plan:  HOME/SELF CARE      DC Planning Services  CM consult      Choice offered to / List presented to:             Status of service:  Completed, signed off Medicare Important Message given?  YES (If response is "NO", the following Medicare IM given date fields will be blank) Date Medicare IM given:  04/24/2014 Medicare IM given by:  Kathyrn Sheriff Date Additional Medicare IM given:   Additional Medicare IM given by:    Discharge Disposition:  HOME/SELF CARE  Per UR Regulation:    If discussed at Long Length of Stay Meetings, dates discussed:    Comments:  04/22/2014 0900 Kathyrn Sheriff, RN, MSN, PCCN Physical therapy recommends walker for pt at home. Pt's brother brought walker to pt. Pt has no further CM needs prior to discharge.

## 2014-04-25 ENCOUNTER — Inpatient Hospital Stay (HOSPITAL_COMMUNITY): Payer: Medicare Other

## 2014-04-25 DIAGNOSIS — J209 Acute bronchitis, unspecified: Secondary | ICD-10-CM | POA: Diagnosis present

## 2014-04-25 LAB — BASIC METABOLIC PANEL
Anion gap: 10 (ref 5–15)
BUN: 3 mg/dL — ABNORMAL LOW (ref 6–23)
CO2: 26 mEq/L (ref 19–32)
CREATININE: 0.55 mg/dL (ref 0.50–1.35)
Calcium: 8.1 mg/dL — ABNORMAL LOW (ref 8.4–10.5)
Chloride: 88 mEq/L — ABNORMAL LOW (ref 96–112)
GFR calc non Af Amer: >90 mL/min (ref >90)
GLUCOSE: 153 mg/dL — AB (ref 70–99)
Potassium: 4.1 mEq/L (ref 3.7–5.3)
Sodium: 124 mEq/L — ABNORMAL LOW (ref 137–147)

## 2014-04-25 LAB — CBC
HEMATOCRIT: 32.7 % — AB (ref 39.0–52.0)
HEMOGLOBIN: 10.9 g/dL — AB (ref 13.0–17.0)
MCH: 31.9 pg (ref 26.0–34.0)
MCHC: 33.3 g/dL (ref 30.0–36.0)
MCV: 95.6 fL (ref 78.0–100.0)
Platelets: 252 10*3/uL (ref 150–400)
RBC: 3.42 MIL/uL — ABNORMAL LOW (ref 4.22–5.81)
RDW: 17.6 % — AB (ref 11.5–15.5)
WBC: 10.4 10*3/uL (ref 4.0–10.5)

## 2014-04-25 LAB — PRO B NATRIURETIC PEPTIDE: Pro B Natriuretic peptide (BNP): 324.5 pg/mL — ABNORMAL HIGH (ref 0–125)

## 2014-04-25 IMAGING — CR DG CHEST 2V
2 series · 2 of 2 positions shown · non-contrast
Comparison: [DATE]

CLINICAL DATA: Multiple right rib fractures

EXAM:
CHEST  2 VIEW

[view not recorded (1 of 2)]
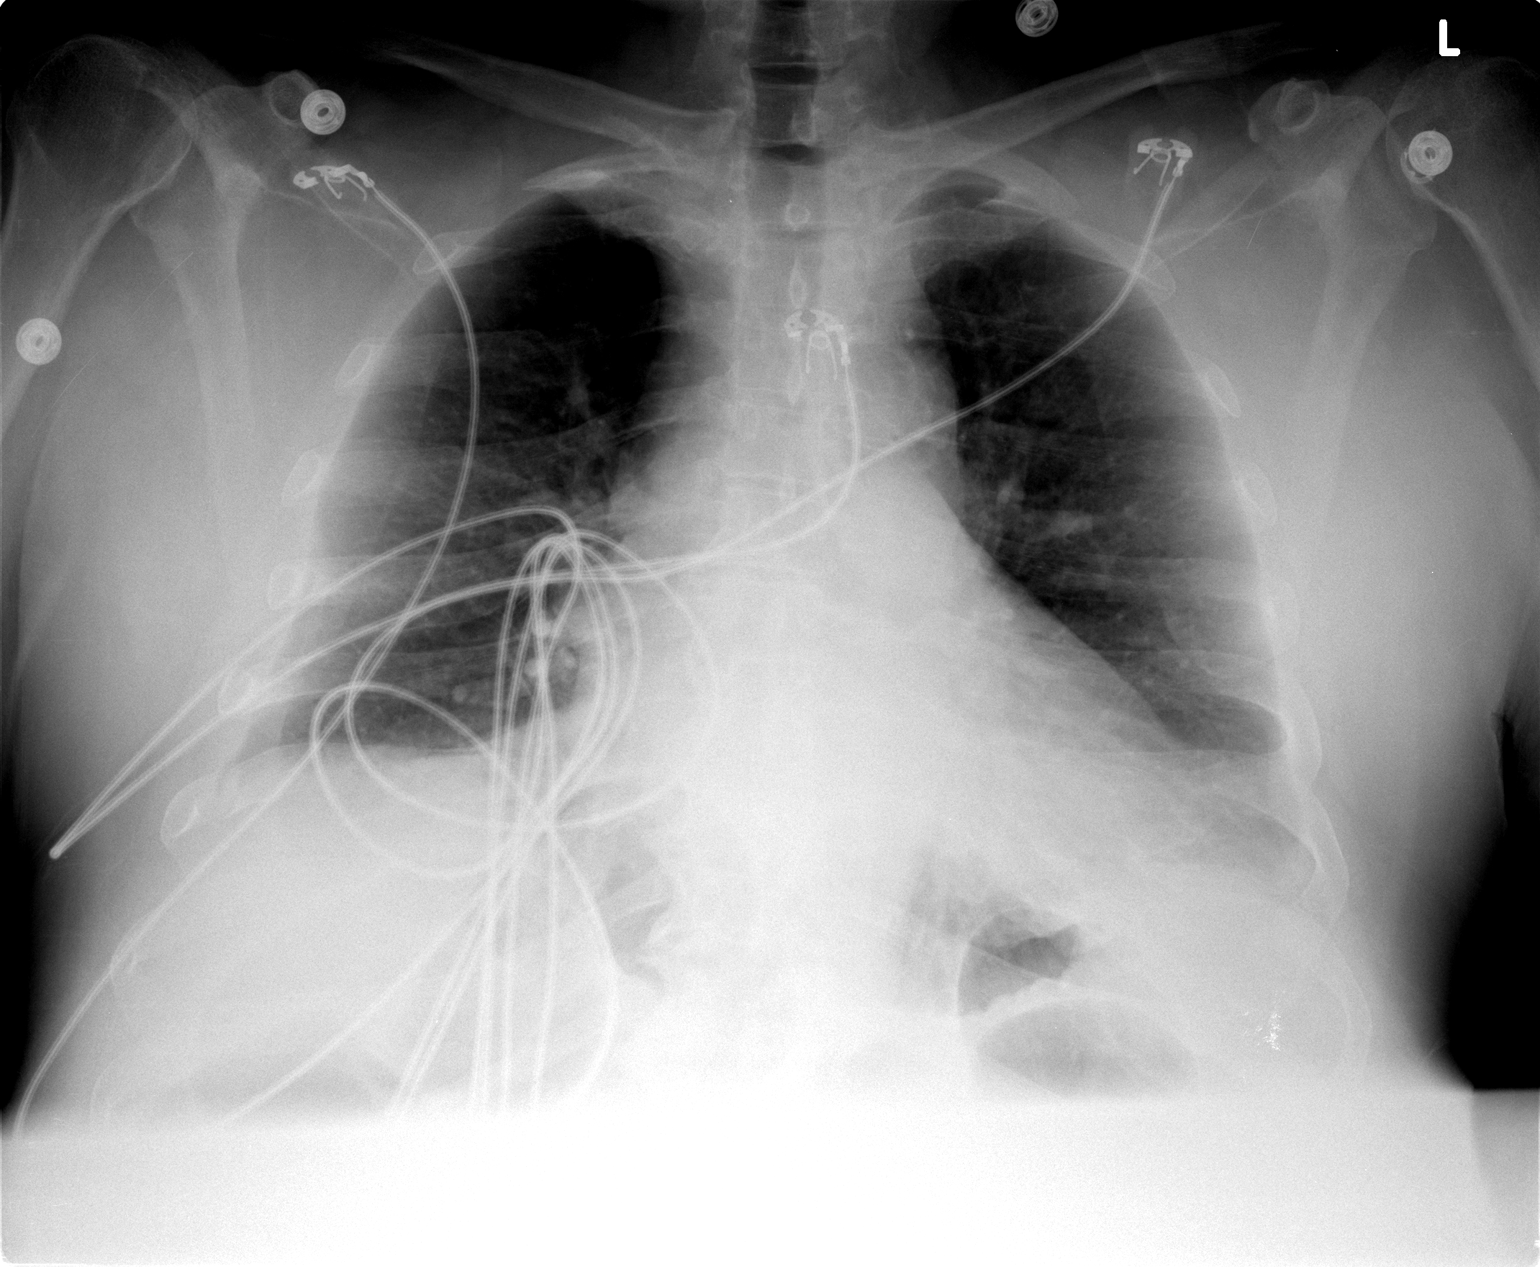

[view not recorded (2 of 2)]
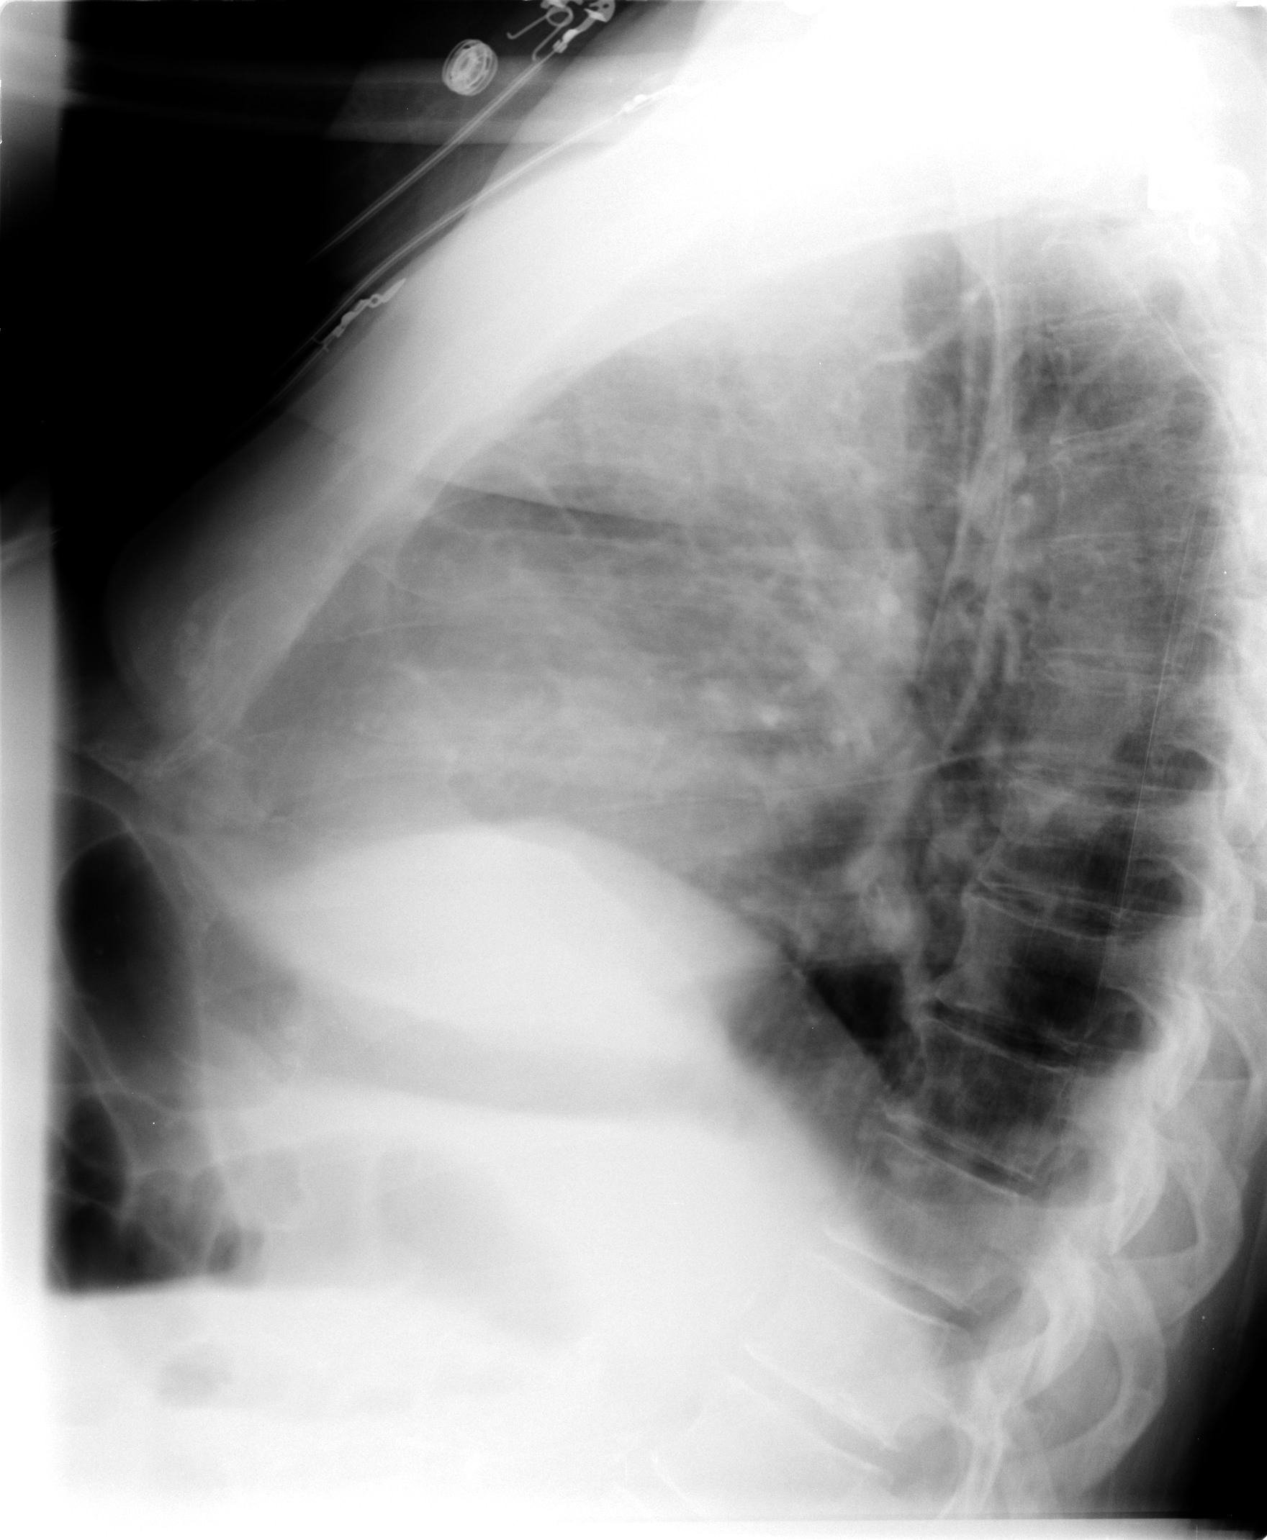

[2 of 2 positions shown; findings below may reference images not displayed]

FINDINGS: Cardiac shadow is stable. The lungs are well-aerated without
pneumothorax or focal infiltrate. Multiple right-sided rib fractures
are again seen.
IMPRESSION: Stable right rib fractures.  No acute abnormality is noted.

## 2014-04-25 MED ORDER — METHYLPREDNISOLONE SODIUM SUCC 125 MG IJ SOLR
125.0000 mg | Freq: Once | INTRAMUSCULAR | Status: AC
  Administered 2014-04-25: 125 mg via INTRAVENOUS
  Filled 2014-04-25: qty 2

## 2014-04-25 MED ORDER — PREDNISONE 10 MG PO TABS
ORAL_TABLET | ORAL | Status: DC
Start: 2014-04-25 — End: 2014-04-26

## 2014-04-25 MED ORDER — IPRATROPIUM-ALBUTEROL 0.5-2.5 (3) MG/3ML IN SOLN
3.0000 mL | RESPIRATORY_TRACT | Status: DC
  Administered 2014-04-25 – 2014-04-26 (×6): 3 mL via RESPIRATORY_TRACT
  Filled 2014-04-25 (×6): qty 3

## 2014-04-25 MED ORDER — LEVOFLOXACIN 750 MG PO TABS: 750.0000 mg | ORAL_TABLET | Freq: Every day | ORAL | Status: DC

## 2014-04-25 MED ORDER — LEVOFLOXACIN IN D5W 750 MG/150ML IV SOLN
750.0000 mg | INTRAVENOUS | Status: DC
  Administered 2014-04-25 – 2014-04-26 (×2): 750 mg via INTRAVENOUS
  Filled 2014-04-25 (×2): qty 150

## 2014-04-25 MED ORDER — PANTOPRAZOLE SODIUM 40 MG PO TBEC
40.0000 mg | DELAYED_RELEASE_TABLET | Freq: Two times a day (BID) | ORAL | Status: DC
  Administered 2014-04-25 – 2014-04-26 (×3): 40 mg via ORAL
  Filled 2014-04-25 (×3): qty 1

## 2014-04-25 MED ORDER — LORATADINE 10 MG PO TABS: 10.0000 mg | ORAL_TABLET | Freq: Every day | ORAL | Status: DC

## 2014-04-25 MED ORDER — METHYLPREDNISOLONE SODIUM SUCC 125 MG IJ SOLR
60.0000 mg | Freq: Four times a day (QID) | INTRAMUSCULAR | Status: DC
Start: 2014-04-25 — End: 2014-04-25

## 2014-04-25 MED ORDER — METHYLPREDNISOLONE SODIUM SUCC 125 MG IJ SOLR
60.0000 mg | Freq: Four times a day (QID) | INTRAMUSCULAR | Status: DC
  Administered 2014-04-25 – 2014-04-26 (×4): 60 mg via INTRAVENOUS
  Filled 2014-04-25 (×4): qty 2

## 2014-04-25 MED ORDER — POLYETHYLENE GLYCOL 3350 17 G PO PACK
17.0000 g | PACK | Freq: Every day | ORAL | Status: DC | PRN
  Administered 2014-04-25: 17 g via ORAL
  Filled 2014-04-25: qty 1

## 2014-04-25 MED ORDER — SODIUM CHLORIDE 1 G PO TABS: 1.0000 g | ORAL_TABLET | Freq: Three times a day (TID) | ORAL | Status: DC

## 2014-04-25 MED ORDER — ALBUTEROL SULFATE HFA 108 (90 BASE) MCG/ACT IN AERS: 2.0000 | INHALATION_SPRAY | Freq: Four times a day (QID) | RESPIRATORY_TRACT | Status: DC | PRN

## 2014-04-25 MED ORDER — FLUTICASONE PROPIONATE 50 MCG/ACT NA SUSP: 1.0000 | Freq: Every day | NASAL | Status: DC

## 2014-04-25 MED ORDER — PANTOPRAZOLE SODIUM 40 MG PO TBEC: 40.0000 mg | DELAYED_RELEASE_TABLET | Freq: Two times a day (BID) | ORAL | Status: DC

## 2014-04-25 MED ORDER — PREDNISONE 10 MG PO TABS: 5.0000 mg | ORAL_TABLET | Freq: Every day | ORAL | Status: DC

## 2014-04-25 MED ORDER — OXYCODONE HCL 5 MG PO TABS: 5.0000 mg | ORAL_TABLET | ORAL | Status: DC | PRN

## 2014-04-25 MED ORDER — DOCUSATE SODIUM 100 MG PO CAPS
100.0000 mg | ORAL_CAPSULE | Freq: Two times a day (BID) | ORAL | Status: DC
  Administered 2014-04-25 – 2014-04-26 (×3): 100 mg via ORAL
  Filled 2014-04-25 (×3): qty 1

## 2014-04-25 NOTE — Progress Notes (Signed)
Patient ID: Billy Stone  male  ZOX:[id number]    DOB: 07/10/1951    DOA: 04/22/2014  PCP: No primary provider on file.   Assessment/Plan: Principal Problem: Acute bronchitis/COPD exacerbation: Diffusely wheezing - Recheck chest x-ray, placed on scheduled bronchodilators, IV Solu-Medrol, levofloxacin, incentive spirometry - O2 sats at bed side showed no hypoxia    Rib fractures with acute pain, pulmonary contusion: Patient apparently consumed large amount of alcohol, felt dizzy and fell, CT chest showed multiple displaced right rib fractures, no pneumothorax, patchy parenchymal contusion in the right lung - Pain improving, continue oral oxycodone and IV Dilaudid as needed, Robaxin, incentive spirometry - PT OT evaluation done recommended rolling walker with 5' wheels but no PT followup  Acute on chronic hyponatremia; initially secondary to dehydration with lactic acidosis, baseline 120-128, patient reports that he has chronic hyponatremia, ?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    HYPERTENSION: - continue BB, hydralazine as needed     CORONARY ARTERY DISEASE  Hyperglycemia - hemoglobin A1c 5.4  DVT Prophylaxis: Lovenox  Code Status: Full code  Family Communication:  Disposition:  Consultants:  None  Procedures:  None  Antibiotics:  IV Rocephin dc 8/29  IV levaquin  Subjective: Patient seen and examined, pain is improving, diffusely wheezing   Objective: Weight change:   Intake/Output Summary (Last 24 hours) at 04/25/14 0829 Last data filed at 04/25/14 0500  Gross per 24 hour  Intake    680 ml  Output   2100 ml  Net   -1420 ml   Blood pressure 143/94, pulse 77, temperature 98.2 F (36.8 C), temperature source Oral, resp. rate 20, height  (1.651 m), weight 90.719 kg (200 lb), SpO2 96.00%.  Physical Exam: General: Alert and awake, oriented x3, NAD CVS: S1-S2 clear, no murmur rubs or gallops Chest: diffuse wheezing (patient is also mouth breather which causes adventitious sounds)  Abdomen: soft nontender, nondistended, normal bowel sounds  Extremities: no cyanosis, clubbing or edema noted bilaterally   Lab Results: Basic Metabolic Panel:  Recent Labs Lab 04/24/14 1306 04/25/14 0645  NA 122* 124*  K 4.1 4.1  CL 88* 88*  CO2 24 26  GLUCOSE 143* 153*  BUN 3* 3*  CREATININE 0.53 0.55  CALCIUM 7.8* 8.1*   Liver Function Tests:  Recent Labs Lab 04/23/14 0614 04/24/14 0628  AST 178* 91*  ALT 39 27  ALKPHOS 108 94  BILITOT 0.7 1.0  PROT 8.0 7.4  ALBUMIN 3.0* 2.7*    Recent Labs Lab 04/22/14 2320  LIPASE 41   No results found for this basename: AMMONIA,  in the last 168 hours CBC:  Recent Labs Lab 04/22/14 2320  04/24/14 0628 04/25/14 0645  WBC 12.4*  < > 10.4 10.4  NEUTROABS 7.9*  --   --   --   HGB 11.6*  < > 10.9* 10.9*  HCT 34.0*  < > 32.0* 32.7*  MCV 92.9  < > 94.7 95.6  PLT 239  < > 264 252  < > = values in this interval not displayed. Cardiac Enzymes: No results found for this basename: CKTOTAL, CKMB, CKMBINDEX, TROPONINI,  in the last 168 hours BNP: No components found  with this basename: POCBNP,  CBG: No results found for this basename: GLUCAP,  in the last 168 hours   Micro Results: Recent Results (from the past 240 hour(s))  URINE CULTURE     Status: None   Collection Time    04/23/14  7:45 AM      Result Value Ref Range Status   Specimen Description URINE, CATHETERIZED   Final   Special Requests NONE   Final   Culture  Setup Time     Final   Value: 04/23/2014 23:30     Performed at [facility] Count     Final   Value:  >=100,000 COLONIES/ML     Performed at [facility]   Culture     Final   Value: GRAM NEGATIVE RODS     Performed at [facility]   Report Status PENDING   Incomplete    Studies/Results: Dg Chest 2 View  04/23/2014   CLINICAL DATA:  FLANK PAIN  EXAM: CHEST  2 VIEW  COMPARISON:  Prior radiograph from 12/10/2009  FINDINGS: The cardiac and mediastinal silhouettes are stable in size and contour, and remain within normal limits.  The lungs are normally inflated. Linear right perihilar opacities most consistent with atelectasis and/ or scarring. Minimal left basilar atelectasis present. No airspace consolidation, pleural effusion, or pulmonary edema is identified. There is no pneumothorax.  No acute osseous abnormality identified.  IMPRESSION: No active cardiopulmonary disease.   Electronically Signed   By: Rise Mu M.D.   On: 04/23/2014 00:41   Ct Head Wo Contrast  04/23/2014   CLINICAL DATA:  Loss of consciousness following a fall.  EXAM: CT HEAD WITHOUT CONTRAST  CT CERVICAL SPINE WITHOUT CONTRAST  TECHNIQUE: Multidetector CT imaging of the head and cervical spine was performed following the standard protocol without intravenous contrast. Multiplanar CT image reconstructions of the cervical spine were also generated.  COMPARISON:  None.  FINDINGS: CT HEAD FINDINGS  Diffusely enlarged ventricles and subarachnoid spaces. Patchy white matter low density in both cerebral hemispheres. No skull fracture, intracranial hemorrhage or paranasal sinus air-fluid levels.  CT CERVICAL SPINE FINDINGS  Mild multilevel degenerative changes. No prevertebral soft tissue swelling, fractures or subluxations. Atheromatous arterial calcifications, including both carotid arteries.  IMPRESSION: 1. No skull fracture or intracranial hemorrhage. 2. No cervical spine fracture or subluxation. 3. Mild diffuse cerebral atrophy and minimal chronic small vessel white matter ischemic changes in both cerebral  hemispheres. 4. Mild cervical spine degenerative changes. 5. Atheromatous arterial calcifications, including the carotid arteries.   Electronically Signed   By: Gordan Payment M.D.   On: 04/23/2014 01:12   Ct Chest W Contrast  04/23/2014   CLINICAL DATA:  Trauma. Fell after consuming alcohol. Pain in the right upper quadrant and right axillary ribs.  EXAM: CT CHEST, ABDOMEN, AND PELVIS WITH CONTRAST  TECHNIQUE: Multidetector CT imaging of the chest, abdomen and pelvis was performed following the standard protocol during bolus administration of intravenous contrast.  CONTRAST:  OMNIPAQUE IOHEXOL 300 MG/ML  SOLN  COMPARISON:  CT abdomen and pelvis 03/08/2010  FINDINGS: CT CHEST FINDINGS  Normal heart size. Diffuse Coronary artery calcifications. Calcification of aorta. No aneurysm. Great vessel origins appear patent. No significant lymphadenopathy in the chest. Esophagus is decompressed. No abnormal mediastinal fluid collections.  Evaluation of lungs is limited due to motion artifact. There appears to be patchy ground-glass change which may represent atelectasis or contusion. No pneumothorax. No pleural effusion. No  focal consolidation.  CT ABDOMEN AND PELVIS FINDINGS  Diffuse fatty infiltration of the liver. The gallbladder, pancreas, adrenal glands, inferior vena cava, and retroperitoneal lymph nodes are unremarkable. Diffuse calcification of the abdominal aorta and superior mesenteric artery. No aneurysm. Calcification of the iliac arteries suggests evidence of stenosis. There is a cyst in the lower pole of the right kidney. No hydronephrosis in either kidney. No contrast extravasation. 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 Stomach, small bowel, and colon are not abnormally distended. No free air or free fluid in the abdomen. No abnormal mesenteric or retroperitoneal fluid collections.  Pelvis: Prostate gland is not enlarged. Fat in the left inguinal canal. Bladder wall is not thickened. There is a small  right posterior bladder diverticulum. Scattered diverticula in the sigmoid colon without diverticulitis. Appendix is not identified. No free or loculated pelvic fluid collections.  Bones: Displaced fractures of the right posterior lateral fifth, sixth, seventh, eighth, ninth, ribs. Left ribs are nondisplaced. Visualized clavicles, shoulders, and sternum appear intact. New normal alignment of the thoracic and lumbar spine. Degenerative changes present. No vertebral compression deformities. The sacrum, pelvis, and hips appear intact.  IMPRESSION: Multiple displaced right rib fractures. No pneumothorax. Possible patchy parenchymal contusions in the right lung. No acute posttraumatic changes demonstrated in the abdomen or pelvis. No evidence of solid organ injury or bowel perforation. Postoperative splenectomy. Fatty infiltration of the liver. Extensive vascular calcifications in the abdomen.   Electronically Signed   By: Burman Nieves M.D.   On: 04/23/2014 01:15   Ct Cervical Spine Wo Contrast  04/23/2014   CLINICAL DATA:  Loss of consciousness following a fall.  EXAM: CT HEAD WITHOUT CONTRAST  CT CERVICAL SPINE WITHOUT CONTRAST  TECHNIQUE: Multidetector CT imaging of the head and cervical spine was performed following the standard protocol without intravenous contrast. Multiplanar CT image reconstructions of the cervical spine were also generated.  COMPARISON:  None.  FINDINGS: CT HEAD FINDINGS  Diffusely enlarged ventricles and subarachnoid spaces. Patchy white matter low density in both cerebral hemispheres. No skull fracture, intracranial hemorrhage or paranasal sinus air-fluid levels.  CT CERVICAL SPINE FINDINGS  Mild multilevel degenerative changes. No prevertebral soft tissue swelling, fractures or subluxations. Atheromatous arterial calcifications, including both carotid arteries.  IMPRESSION: 1. No skull fracture or intracranial hemorrhage. 2. No cervical spine fracture or subluxation. 3. Mild diffuse  cerebral atrophy and minimal chronic small vessel white matter ischemic changes in both cerebral hemispheres. 4. Mild cervical spine degenerative changes. 5. Atheromatous arterial calcifications, including the carotid arteries.   Electronically Signed   By: Gordan Payment M.D.   On: 04/23/2014 01:12   Ct Abdomen W Contrast  04/23/2014   CLINICAL DATA:  Trauma. Fell after consuming alcohol. Pain in the right upper quadrant and right axillary ribs.  EXAM: CT CHEST, ABDOMEN, AND PELVIS WITH CONTRAST  TECHNIQUE: Multidetector CT imaging of the chest, abdomen and pelvis was performed following the standard protocol during bolus administration of intravenous contrast.  CONTRAST:  OMNIPAQUE IOHEXOL 300 MG/ML  SOLN  COMPARISON:  CT abdomen and pelvis 03/08/2010  FINDINGS: CT CHEST FINDINGS  Normal heart size. Diffuse Coronary artery calcifications. Calcification of aorta. No aneurysm. Great vessel origins appear patent. No significant lymphadenopathy in the chest. Esophagus is decompressed. No abnormal mediastinal fluid collections.  Evaluation of lungs is limited due to motion artifact. There appears to be patchy ground-glass change which may represent atelectasis or contusion. No pneumothorax. No pleural effusion. No focal consolidation.  CT ABDOMEN AND PELVIS FINDINGS  Diffuse fatty infiltration of the liver. The gallbladder, pancreas, adrenal glands, inferior vena cava, and retroperitoneal lymph nodes are unremarkable. Diffuse calcification of the abdominal aorta and superior mesenteric artery. No aneurysm. Calcification of the iliac arteries suggests evidence of stenosis. There is a cyst in the lower pole of the right kidney. No hydronephrosis in either kidney. No contrast extravasation. 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 Stomach, small bowel, and colon are not abnormally distended. No free air or free fluid in the abdomen. No abnormal mesenteric or retroperitoneal fluid collections.  Pelvis: Prostate gland  is not enlarged. Fat in the left inguinal canal. Bladder wall is not thickened. There is a small right posterior bladder diverticulum. Scattered diverticula in the sigmoid colon without diverticulitis. Appendix is not identified. No free or loculated pelvic fluid collections.  Bones: Displaced fractures of the right posterior lateral fifth, sixth, seventh, eighth, ninth, ribs. Left ribs are nondisplaced. Visualized clavicles, shoulders, and sternum appear intact. New normal alignment of the thoracic and lumbar spine. Degenerative changes present. No vertebral compression deformities. The sacrum, pelvis, and hips appear intact.  IMPRESSION: Multiple displaced right rib fractures. No pneumothorax. Possible patchy parenchymal contusions in the right lung. No acute posttraumatic changes demonstrated in the abdomen or pelvis. No evidence of solid organ injury or bowel perforation. Postoperative splenectomy. Fatty infiltration of the liver. Extensive vascular calcifications in the abdomen.   Electronically Signed   By: Burman Nieves M.D.   On: 04/23/2014 01:15    Medications: Scheduled Meds: . enoxaparin (LOVENOX) injection  40 mg Subcutaneous Q24H  . fluticasone  1 spray Each Nare Daily  . folic acid  1 mg Oral Daily  . ipratropium-albuterol  3 mL Nebulization Q4H  . levofloxacin (LEVAQUIN) IV  750 mg Intravenous Q24H  . loratadine  10 mg Oral Daily  . methocarbamol  750 mg Oral TID  . methylPREDNISolone (SOLU-MEDROL) injection  125 mg Intravenous Once  . methylPREDNISolone (SOLU-MEDROL) injection  60 mg Intravenous Q6H  . metoprolol tartrate  50 mg Oral BID  . multivitamin with minerals  1 tablet Oral Daily  . pantoprazole  40 mg Oral BID AC  . sodium chloride  3 mL Intravenous Q12H  . sodium chloride  1 g Oral TID WC  . thiamine  100 mg Oral Daily   Or  . thiamine  100 mg Intravenous Daily      LOS: 3 days   RAI,RIPUDEEP M.D. [facility] 04/25/2014, 8:29 AM Pager: [phone]  If  7PM-7AM, please contact night-coverage [email or web address] Password [email or web address]  **Disclaimer: This note was dictated with voice recognition software. Similar sounding words can inadvertently be transcribed and this note may contain transcription errors which may not have been corrected upon publication of note.**

## 2014-04-26 DIAGNOSIS — D539 Nutritional anemia, unspecified: Secondary | ICD-10-CM

## 2014-04-26 DIAGNOSIS — J209 Acute bronchitis, unspecified: Secondary | ICD-10-CM

## 2014-04-26 LAB — BASIC METABOLIC PANEL
ANION GAP: 13 (ref 5–15)
BUN: 5 mg/dL — AB (ref 6–23)
CHLORIDE: 86 meq/L — AB (ref 96–112)
CO2: 26 mEq/L (ref 19–32)
CREATININE: 0.49 mg/dL — AB (ref 0.50–1.35)
Calcium: 8.7 mg/dL (ref 8.4–10.5)
Glucose, Bld: 180 mg/dL — ABNORMAL HIGH (ref 70–99)
Potassium: 4.2 mEq/L (ref 3.7–5.3)
Sodium: 125 mEq/L — ABNORMAL LOW (ref 137–147)

## 2014-04-26 LAB — CBC
HCT: 32 % — ABNORMAL LOW (ref 39.0–52.0)
Hemoglobin: 10.9 g/dL — ABNORMAL LOW (ref 13.0–17.0)
MCH: 32.5 pg (ref 26.0–34.0)
MCHC: 34.1 g/dL (ref 30.0–36.0)
MCV: 95.5 fL (ref 78.0–100.0)
PLATELETS: 300 10*3/uL (ref 150–400)
RBC: 3.35 MIL/uL — ABNORMAL LOW (ref 4.22–5.81)
RDW: 17.8 % — AB (ref 11.5–15.5)
WBC: 10.2 10*3/uL (ref 4.0–10.5)

## 2014-04-26 LAB — URINE CULTURE

## 2014-04-26 MED ORDER — TIOTROPIUM BROMIDE MONOHYDRATE 18 MCG IN CAPS: 18.0000 ug | ORAL_CAPSULE | Freq: Every day | RESPIRATORY_TRACT | Status: DC

## 2014-04-26 MED ORDER — PREDNISONE 20 MG PO TABS
60.0000 mg | ORAL_TABLET | Freq: Once | ORAL | Status: AC
  Administered 2014-04-26: 60 mg via ORAL
  Filled 2014-04-26: qty 3

## 2014-04-26 MED ORDER — DSS 100 MG PO CAPS: 100.0000 mg | ORAL_CAPSULE | Freq: Two times a day (BID) | ORAL | Status: DC

## 2014-04-26 MED ORDER — TIOTROPIUM BROMIDE MONOHYDRATE 18 MCG IN CAPS
18.0000 ug | ORAL_CAPSULE | Freq: Every day | RESPIRATORY_TRACT | Status: DC
  Filled 2014-04-26: qty 5

## 2014-04-26 MED ORDER — OXYCODONE HCL 5 MG PO TABS: 5.0000 mg | ORAL_TABLET | Freq: Four times a day (QID) | ORAL | Status: DC | PRN

## 2014-04-26 MED ORDER — LEVOFLOXACIN 750 MG PO TABS: 750.0000 mg | ORAL_TABLET | Freq: Every day | ORAL | Status: DC

## 2014-04-26 MED ORDER — PANTOPRAZOLE SODIUM 40 MG PO TBEC: 40.0000 mg | DELAYED_RELEASE_TABLET | Freq: Two times a day (BID) | ORAL | Status: DC

## 2014-04-26 MED ORDER — METHOCARBAMOL 750 MG PO TABS: 750.0000 mg | ORAL_TABLET | Freq: Three times a day (TID) | ORAL | Status: DC

## 2014-04-26 MED ORDER — POLYETHYLENE GLYCOL 3350 17 G PO PACK: 17.0000 g | PACK | Freq: Every day | ORAL | Status: DC | PRN

## 2014-04-26 MED ORDER — PREDNISONE 10 MG PO TABS: ORAL_TABLET | ORAL | Status: DC

## 2014-04-26 MED ORDER — ALBUTEROL SULFATE HFA 108 (90 BASE) MCG/ACT IN AERS: 2.0000 | INHALATION_SPRAY | Freq: Four times a day (QID) | RESPIRATORY_TRACT | Status: DC | PRN

## 2014-04-26 MED ORDER — FLUTICASONE PROPIONATE 50 MCG/ACT NA SUSP: 1.0000 | Freq: Every day | NASAL | Status: DC

## 2014-04-26 MED ORDER — SODIUM CHLORIDE 1 G PO TABS: 1.0000 g | ORAL_TABLET | Freq: Two times a day (BID) | ORAL | Status: DC

## 2014-04-26 MED ORDER — LORATADINE 10 MG PO TABS: 10.0000 mg | ORAL_TABLET | Freq: Every day | ORAL | Status: DC

## 2014-04-26 NOTE — Progress Notes (Signed)
Pt discharged home today per Dr. Isidoro Donning.  Pt's IV site D/C'd and WDL. Pt's VSS. Pt provided with home medication list, discharge instructions and prescriptions. Verbalized understanding. Pt to leave floor via WC in stable condition.

## 2014-04-26 NOTE — Discharge Summary (Signed)
Physician Discharge Summary  Patient ID: MARSH HECKLER MRN: [id number] DOB/AGE: 1951-04-27 63 y.o.  Admit date: 04/22/2014 Discharge date: 04/26/2014  Primary Care Physician:  No primary provider on file.  Discharge Diagnoses:    . Rib fractures . 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 UTI (urinary tract infection) . Acute bronchitis . HYPERTENSION . CORONARY ARTERY DISEASE . ABUSE, ALCOHOL, UNSPECIFIED   Consults: Pulmonology   Recommendations for Outpatient Follow-up:  Please obtain renal panel, sodium level in one week  Allergies:   Allergies  Allergen Reactions  . Amlodipine Besylate     REACTION: Feet swell  . Penicillins Other (See Comments)    Childhood allergy     Discharge Medications:   Medication List         albuterol 108 (90 BASE) MCG/ACT inhaler  Commonly known as:  PROVENTIL HFA;VENTOLIN HFA  Inhale 2 puffs into the lungs every 6 (six) hours as needed for wheezing or shortness of breath.     aspirin EC 325 MG tablet  Take 325 mg by mouth daily.     DSS 100 MG Caps  Take 100 mg by mouth 2 (two) times daily. For constipation     EQ CHLORTABS 4 MG tablet  Generic drug:  chlorpheniramine  Take 4 mg by mouth every 4 (four) hours as needed for allergies.     fluticasone 50 MCG/ACT nasal spray  Commonly known as:  FLONASE  Place 1 spray into both nostrils daily.     levofloxacin 750 MG tablet  Commonly known as:  LEVAQUIN  Take 1 tablet (750 mg total) by mouth daily. X 5 days     loratadine 10 MG tablet  Commonly known as:  CLARITIN  Take 1 tablet (10 mg total) by mouth daily.     methocarbamol 750 MG tablet  Commonly known as:  ROBAXIN  Take 1 tablet (750 mg total) by mouth 3 (three) times daily.     metoprolol 50 MG tablet  Commonly known as:  LOPRESSOR  Take 50 mg by mouth 2 (two) times daily.     oxyCODONE 5 MG immediate release tablet  Commonly known as:  Oxy IR/ROXICODONE  Take 1 tablet (5 mg total) by mouth  every 6 (six) hours as needed for severe pain or breakthrough pain.     pantoprazole 40 MG tablet  Commonly known as:  PROTONIX  Take 1 tablet (40 mg total) by mouth 2 (two) times daily before a meal.     polyethylene glycol packet  Commonly known as:  MIRALAX / GLYCOLAX  Take 17 g by mouth daily as needed for moderate constipation.     predniSONE 10 MG tablet  Commonly known as:  DELTASONE  - Prednisone dosing: Take  Prednisone  (4 tabs) x 3 days, then taper to  (3 tabs) x 3 days, then  (2 tabs) x 3days, then  (1 tab) x 3days, then OFF.  -   - Dispense:  30 tabs, refills: None  Start taking on:  04/27/2014     sodium chloride 1 G tablet  Take 1 tablet (1 g total) by mouth 2 (two) times daily with a meal.     SUPER B COMPLEX PO  Take 1 tablet by mouth daily.         Brief H and P: For complete details please refer to admission H and P, but in brief 63 year old male with a history of hypertension, coronary artery disease, alcohol abuse, chronic  hyponatremia who comes to the ED after patient*fell at home. Patient drinks alcohol almost everyday, he consumed large amount of alcohol tonight and felt dizzy causing him to fall. He is unsure about her loss of consciousness. Does complain of dizziness. Patient called for help and his brother arrived who stays with him. Patient complained of right-sided chest pain after the fall. He denies hitting his head.  In the ED all the imaging studies were negative except for a CT chest which showed multiple displaced right rib fractures no pneumothorax. Fatty infiltration of the liver   Hospital Course:  Acute bronchitis/ ?COPD: Significantly improved, chest x-ray showed no pneumonia on hyperinflation. Patient was placed on scheduled bronchodilators IV Solu-Medrol, levofloxacin, incentive spirometry. O2 sats at bed side showed no hypoxia  He has significantly improved, pulmonology, Dr Juanetta Gosling was consulted and also felt patient is  stable for discharge. Patient was placed on oral prednisone with taper, albuterol inhalers, and Spiriva. He will benefit from outpatient PFTs.  Rib fractures with acute pain, pulmonary contusion: Patient apparently consumed large amount of alcohol, felt dizzy and fell, CT chest showed multiple displaced right rib fractures, no pneumothorax, patchy parenchymal contusion in the right lung pain is improving, continue oral oxycodone as needed, Robaxin, incentive spirometry . 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 Patient recommended to repeat labs outpatient for renal panel, sodium level   GNR UTI/lower urinary tract infection ; continue oral Levaquin to complete the course  ABUSE, ALCOHOL, UNSPECIFIED  - counseled strongly on alcohol cessation, placed on CIWA protocol , patient did not have acute alcohol withdrawals - LFTs elevated with alcohol abuse.   HYPERTENSION:  - continue BB, hydralazine as needed   CORONARY ARTERY DISEASE  Hyperglycemia  - hemoglobin A1c 5.4    Day of Discharge BP 158/79  Pulse 76  Temp(Src) 98.5 F (36.9 C) (Oral)  Resp 20  Ht  (1.651 m)  Wt 90.719 kg (200 lb)  BMI 33.28 kg/m2  SpO2 94%  Physical Exam: General: Alert and awake oriented x3 not in any acute distress. CVS: S1-S2 clear no murmur rubs or gallops Chest: Minimal expiratory wheezing  Abdomen: soft nontender, nondistended, normal bowel sounds Extremities: no cyanosis, clubbing or edema noted bilaterally Neuro: Cranial nerves II-XII intact, no focal neurological deficits   The results of significant diagnostics from this hospitalization (including imaging, microbiology,  ancillary and laboratory) are listed below for reference.    LAB RESULTS: Basic Metabolic Panel:  Recent Labs Lab 04/25/14 0645 04/26/14 0542  NA 124* 125*  K 4.1 4.2  CL 88* 86*  CO2 26 26  GLUCOSE 153* 180*  BUN 3* 5*  CREATININE 0.55 0.49*  CALCIUM 8.1* 8.7   Liver Function Tests:  Recent Labs Lab 04/23/14 0614 04/24/14 0628  AST 178* 91*  ALT 39 27  ALKPHOS 108 94  BILITOT 0.7 1.0  PROT 8.0 7.4  ALBUMIN 3.0* 2.7*    Recent Labs Lab 04/22/14 2320  LIPASE 41   No results found for this basename: AMMONIA,  in the last 168 hours CBC:  Recent Labs Lab 04/22/14 2320  04/25/14 0645 04/26/14 0542  WBC 12.4*  < > 10.4 10.2  NEUTROABS 7.9*  --   --   --  HGB 11.6*  < > 10.9* 10.9*  HCT 34.0*  < > 32.7* 32.0*  MCV 92.9  < > 95.6 95.5  PLT 239  < > 252 300  < > = values in this interval not displayed. Cardiac Enzymes: No results found for this basename: CKTOTAL, CKMB, CKMBINDEX, TROPONINI,  in the last 168 hours BNP: No components found with this basename: POCBNP,  CBG: No results found for this basename: GLUCAP,  in the last 168 hours  Significant Diagnostic Studies:  Dg Chest 2 View  04/23/2014   CLINICAL DATA:  FLANK PAIN  EXAM: CHEST  2 VIEW  COMPARISON:  Prior radiograph from 12/10/2009  FINDINGS: The cardiac and mediastinal silhouettes are stable in size and contour, and remain within normal limits.  The lungs are normally inflated. Linear right perihilar opacities most consistent with atelectasis and/ or scarring. Minimal left basilar atelectasis present. No airspace consolidation, pleural effusion, or pulmonary edema is identified. There is no pneumothorax.  No acute osseous abnormality identified.  IMPRESSION: No active cardiopulmonary disease.   Electronically Signed   By: Rise Mu M.D.   On: 04/23/2014 00:41   Ct Head Wo Contrast  04/23/2014   CLINICAL DATA:  Loss of consciousness following a fall.  EXAM: CT HEAD WITHOUT CONTRAST  CT  CERVICAL SPINE WITHOUT CONTRAST  TECHNIQUE: Multidetector CT imaging of the head and cervical spine was performed following the standard protocol without intravenous contrast. Multiplanar CT image reconstructions of the cervical spine were also generated.  COMPARISON:  None.  FINDINGS: CT HEAD FINDINGS  Diffusely enlarged ventricles and subarachnoid spaces. Patchy white matter low density in both cerebral hemispheres. No skull fracture, intracranial hemorrhage or paranasal sinus air-fluid levels.  CT CERVICAL SPINE FINDINGS  Mild multilevel degenerative changes. No prevertebral soft tissue swelling, fractures or subluxations. Atheromatous arterial calcifications, including both carotid arteries.  IMPRESSION: 1. No skull fracture or intracranial hemorrhage. 2. No cervical spine fracture or subluxation. 3. Mild diffuse cerebral atrophy and minimal chronic small vessel white matter ischemic changes in both cerebral hemispheres. 4. Mild cervical spine degenerative changes. 5. Atheromatous arterial calcifications, including the carotid arteries.   Electronically Signed   By: Gordan Payment M.D.   On: 04/23/2014 01:12   Ct Chest W Contrast  04/23/2014   CLINICAL DATA:  Trauma. Fell after consuming alcohol. Pain in the right upper quadrant and right axillary ribs.  EXAM: CT CHEST, ABDOMEN, AND PELVIS WITH CONTRAST  TECHNIQUE: Multidetector CT imaging of the chest, abdomen and pelvis was performed following the standard protocol during bolus administration of intravenous contrast.  CONTRAST:  OMNIPAQUE IOHEXOL 300 MG/ML  SOLN  COMPARISON:  CT abdomen and pelvis 03/08/2010  FINDINGS: CT CHEST FINDINGS  Normal heart size. Diffuse Coronary artery calcifications. Calcification of aorta. No aneurysm. Great vessel origins appear patent. No significant lymphadenopathy in the chest. Esophagus is decompressed. No abnormal mediastinal fluid collections.  Evaluation of lungs is limited due to motion artifact. There appears to be  patchy ground-glass change which may represent atelectasis or contusion. No pneumothorax. No pleural effusion. No focal consolidation.  CT ABDOMEN AND PELVIS FINDINGS  Diffuse fatty infiltration of the liver. The gallbladder, pancreas, adrenal glands, inferior vena cava, and retroperitoneal lymph nodes are unremarkable. Diffuse calcification of the abdominal aorta and superior mesenteric artery. No aneurysm. Calcification of the iliac arteries suggests evidence of stenosis. There is a cyst in the lower pole of the right kidney. No hydronephrosis in either kidney. No contrast extravasation. Spleen is absent,  likely postoperative. Stomach, small bowel, and colon are not abnormally distended. No free air or free fluid in the abdomen. No abnormal mesenteric or retroperitoneal fluid collections.  Pelvis: Prostate gland is not enlarged. Fat in the left inguinal canal. Bladder wall is not thickened. There is a small right posterior bladder diverticulum. Scattered diverticula in the sigmoid colon without diverticulitis. Appendix is not identified. No free or loculated pelvic fluid collections.  Bones: Displaced fractures of the right posterior lateral fifth, sixth, seventh, eighth, ninth, ribs. Left ribs are nondisplaced. Visualized clavicles, shoulders, and sternum appear intact. New normal alignment of the thoracic and lumbar spine. Degenerative changes present. No vertebral compression deformities. The sacrum, pelvis, and hips appear intact.  IMPRESSION: Multiple displaced right rib fractures. No pneumothorax. Possible patchy parenchymal contusions in the right lung. No acute posttraumatic changes demonstrated in the abdomen or pelvis. No evidence of solid organ injury or bowel perforation. Postoperative splenectomy. Fatty infiltration of the liver. Extensive vascular calcifications in the abdomen.   Electronically Signed   By: Burman Nieves M.D.   On: 04/23/2014 01:15   Ct Cervical Spine Wo Contrast  04/23/2014    CLINICAL DATA:  Loss of consciousness following a fall.  EXAM: CT HEAD WITHOUT CONTRAST  CT CERVICAL SPINE WITHOUT CONTRAST  TECHNIQUE: Multidetector CT imaging of the head and cervical spine was performed following the standard protocol without intravenous contrast. Multiplanar CT image reconstructions of the cervical spine were also generated.  COMPARISON:  None.  FINDINGS: CT HEAD FINDINGS  Diffusely enlarged ventricles and subarachnoid spaces. Patchy white matter low density in both cerebral hemispheres. No skull fracture, intracranial hemorrhage or paranasal sinus air-fluid levels.  CT CERVICAL SPINE FINDINGS  Mild multilevel degenerative changes. No prevertebral soft tissue swelling, fractures or subluxations. Atheromatous arterial calcifications, including both carotid arteries.  IMPRESSION: 1. No skull fracture or intracranial hemorrhage. 2. No cervical spine fracture or subluxation. 3. Mild diffuse cerebral atrophy and minimal chronic small vessel white matter ischemic changes in both cerebral hemispheres. 4. Mild cervical spine degenerative changes. 5. Atheromatous arterial calcifications, including the carotid arteries.   Electronically Signed   By: Gordan Payment M.D.   On: 04/23/2014 01:12   Ct Abdomen W Contrast  04/23/2014   CLINICAL DATA:  Trauma. Fell after consuming alcohol. Pain in the right upper quadrant and right axillary ribs.  EXAM: CT CHEST, ABDOMEN, AND PELVIS WITH CONTRAST  TECHNIQUE: Multidetector CT imaging of the chest, abdomen and pelvis was performed following the standard protocol during bolus administration of intravenous contrast.  CONTRAST:  OMNIPAQUE IOHEXOL 300 MG/ML  SOLN  COMPARISON:  CT abdomen and pelvis 03/08/2010  FINDINGS: CT CHEST FINDINGS  Normal heart size. Diffuse Coronary artery calcifications. Calcification of aorta. No aneurysm. Great vessel origins appear patent. No significant lymphadenopathy in the chest. Esophagus is decompressed. No abnormal mediastinal  fluid collections.  Evaluation of lungs is limited due to motion artifact. There appears to be patchy ground-glass change which may represent atelectasis or contusion. No pneumothorax. No pleural effusion. No focal consolidation.  CT ABDOMEN AND PELVIS FINDINGS  Diffuse fatty infiltration of the liver. The gallbladder, pancreas, adrenal glands, inferior vena cava, and retroperitoneal lymph nodes are unremarkable. Diffuse calcification of the abdominal aorta and superior mesenteric artery. No aneurysm. Calcification of the iliac arteries suggests evidence of stenosis. There is a cyst in the lower pole of the right kidney. No hydronephrosis in either kidney. No contrast extravasation. 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 Stomach, small bowel, and colon are  not abnormally distended. No free air or free fluid in the abdomen. No abnormal mesenteric or retroperitoneal fluid collections.  Pelvis: Prostate gland is not enlarged. Fat in the left inguinal canal. Bladder wall is not thickened. There is a small right posterior bladder diverticulum. Scattered diverticula in the sigmoid colon without diverticulitis. Appendix is not identified. No free or loculated pelvic fluid collections.  Bones: Displaced fractures of the right posterior lateral fifth, sixth, seventh, eighth, ninth, ribs. Left ribs are nondisplaced. Visualized clavicles, shoulders, and sternum appear intact. New normal alignment of the thoracic and lumbar spine. Degenerative changes present. No vertebral compression deformities. The sacrum, pelvis, and hips appear intact.  IMPRESSION: Multiple displaced right rib fractures. No pneumothorax. Possible patchy parenchymal contusions in the right lung. No acute posttraumatic changes demonstrated in the abdomen or pelvis. No evidence of solid organ injury or bowel perforation. Postoperative splenectomy. Fatty infiltration of the liver. Extensive vascular calcifications in the abdomen.   Electronically Signed   By:  Burman Nieves M.D.   On: 04/23/2014 01:15       Disposition and Follow-up:     Discharge Instructions   Diet general    Complete by:  As directed      Discharge instructions    Complete by:  As directed   Please get your labs checked for sodium level next week.     Increase activity slowly    Complete by:  As directed             DISPOSITION home  DIET: Regular diet   DISCHARGE FOLLOW-UP Follow-up Information   Follow up with [facility], MD. Schedule an appointment as soon as possible for a visit in 1 week. (for hospital follow-up, obtain labs for sodium level )    Specialty:  Internal Medicine   Contact information:   [address] [place] [address] [phone]       Follow up with HAWKINS,EDWARD L, MD. Schedule an appointment as soon as possible for a visit in 2 weeks. (for hospital follow-up)    Specialty:  Pulmonary Disease   Contact information:   [address] [place] [address] [phone]       Time spent on Discharge: 38 mins  Signed:   Perez Dirico M.D. [facility] 04/26/2014, 10:15 AM Pager: [phone]   **Disclaimer: This note was dictated with voice recognition software. Similar sounding words can inadvertently be transcribed and this note may contain transcription errors which may not have been corrected upon publication of note.**

## 2014-04-26 NOTE — Progress Notes (Signed)
Pt instructed on correct use of both Spiriva inhaler as well as standard walker. Teach back method used. Understanding also verbalized.

## 2014-04-26 NOTE — Consult Note (Signed)
Consult requested by:Dr. Rai Consult requested forCOPD:  HPI: This is a 63 year old who came to the emergency department after falling at home. He had rib fractures. He has been having shortness of breath cough congestion and wheezing. Consultation is requested to see if he is stable enough for discharge  History reviewed. No pertinent past medical history.   History reviewed. No pertinent family history.   History   Social History  . Marital Status: Divorced    Spouse Name: N/A    Number of Children: N/A  . Years of Education: N/A   Social History Main Topics  . Smoking status: Never Smoker   . Smokeless tobacco: None  . Alcohol Use: None  . Drug Use: None  . Sexual Activity: None   Other Topics Concern  . None   Social History Narrative  . None     ROS: He does have chest pain. He denies any hemoptysis. No nausea vomiting or diarrhea. No headache.    Objective: Vital signs in last 24 hours: Temp:  [98.1 F (36.7 C)-98.5 F (36.9 C)] 98.5 F (36.9 C) (08/30 0637) Pulse Rate:  [64-76] 76 (08/30 0637) Resp:  [16-20] 20 (08/30 0637) BP: (158-166)/(77-79) 158/79 mmHg (08/30 0637) SpO2:  [94 %-98 %] 94 % (08/30 0750) Weight change:  Last BM Date: 04/22/14  Intake/Output from previous day: 08/29 0701 - 08/30 0700 In: 1050 [P.O.:900; IV Piggyback:150] Out: 1200 [Urine:1200]  PHYSICAL EXAM He is awake and alert. He looks comfortable. His chest shows minimal end expiratory wheezing. His heart is regular. His abdomen is soft. Extremities showed no edema  Lab Results: Basic Metabolic Panel:  Recent Labs  21/30/86 0645 04/26/14 0542  NA 124* 125*  K 4.1 4.2  CL 88* 86*  CO2 26 26  GLUCOSE 153* 180*  BUN 3* 5*  CREATININE 0.55 0.49*  CALCIUM 8.1* 8.7   Liver Function Tests:  Recent Labs  04/24/14 0628  AST 91*  ALT 27  ALKPHOS 94  BILITOT 1.0  PROT 7.4  ALBUMIN 2.7*   No results found for this basename: LIPASE, AMYLASE,  in the last 72  hours No results found for this basename: AMMONIA,  in the last 72 hours CBC:  Recent Labs  04/25/14 0645 04/26/14 0542  WBC 10.4 10.2  HGB 10.9* 10.9*  HCT 32.7* 32.0*  MCV 95.6 95.5  PLT 252 300   Cardiac Enzymes: No results found for this basename: CKTOTAL, CKMB, CKMBINDEX, TROPONINI,  in the last 72 hours BNP:  Recent Labs  04/25/14 0620  PROBNP 324.5*   D-Dimer: No results found for this basename: DDIMER,  in the last 72 hours CBG: No results found for this basename: GLUCAP,  in the last 72 hours Hemoglobin A1C: No results found for this basename: HGBA1C,  in the last 72 hours Fasting Lipid Panel: No results found for this basename: CHOL, HDL, LDLCALC, TRIG, CHOLHDL, LDLDIRECT,  in the last 72 hours Thyroid Function Tests: No results found for this basename: TSH, T4TOTAL, FREET4, T3FREE, THYROIDAB,  in the last 72 hours Anemia Panel: No results found for this basename: VITAMINB12, FOLATE, FERRITIN, TIBC, IRON, RETICCTPCT,  in the last 72 hours Coagulation: No results found for this basename: LABPROT, INR,  in the last 72 hours Urine Drug Screen: Drugs of Abuse  No results found for this basename: labopia, cocainscrnur, labbenz, amphetmu, thcu, labbarb    Alcohol Level: No results found for this basename: ETH,  in the last 72 hours Urinalysis: No results  found for this basename: COLORURINE, APPERANCEUR, LABSPEC, PHURINE, GLUCOSEU, HGBUR, BILIRUBINUR, KETONESUR, PROTEINUR, UROBILINOGEN, NITRITE, LEUKOCYTESUR,  in the last 72 hours Misc. Labs:   ABGS: No results found for this basename: PHART, PCO2, PO2ART, TCO2, HCO3,  in the last 72 hours   MICROBIOLOGY: Recent Results (from the past 240 hour(s))  URINE CULTURE     Status: None   Collection Time    04/23/14  7:45 AM      Result Value Ref Range Status   Specimen Description URINE, CATHETERIZED   Final   Special Requests NONE   Final   Culture  Setup Time     Final   Value: 04/23/2014 23:30      Performed at [facility] Count     Final   Value: >=100,000 COLONIES/ML     Performed at [facility]   Culture     Final   Value: GRAM NEGATIVE RODS     Performed at [facility]   Report Status PENDING   Incomplete    Studies/Results: Dg Chest 2 View  04/25/2014   CLINICAL DATA:  Multiple right rib fractures  EXAM: CHEST  2 VIEW  COMPARISON:  04/23/2014  FINDINGS: Cardiac shadow is stable. The lungs are well-aerated without pneumothorax or focal infiltrate. Multiple right-sided rib fractures are again seen.  IMPRESSION: Stable right rib fractures.  No acute abnormality is noted.   Electronically Signed   By: Alcide Clever M.D.   On: 04/25/2014 08:39    Medications:  Prior to Admission:  Prescriptions prior to admission  Medication Sig Dispense Refill  . aspirin EC 325 MG tablet Take 325 mg by mouth daily.      . B Complex-C (SUPER B COMPLEX PO) Take 1 tablet by mouth daily.      . chlorpheniramine (EQ CHLORTABS) 4 MG tablet Take 4 mg by mouth every 4 (four) hours as needed for allergies.      . metoprolol (LOPRESSOR) 50 MG tablet Take 50 mg by mouth 2 (two) times daily.      . [DISCONTINUED] sodium chloride 1 G tablet Take 1 g by mouth as needed (low sodium).       Scheduled: . docusate sodium  100 mg Oral BID  . enoxaparin (LOVENOX) injection  40 mg Subcutaneous Q24H  . fluticasone  1 spray Each Nare Daily  . folic acid  1 mg Oral Daily  . ipratropium-albuterol  3 mL Nebulization Q4H  . levofloxacin (LEVAQUIN) IV  750 mg Intravenous Q24H  . loratadine  10 mg Oral Daily  . methocarbamol  750 mg Oral TID  . methylPREDNISolone (SOLU-MEDROL) injection  60 mg Intravenous Q6H  . metoprolol tartrate  50 mg Oral BID  . multivitamin with minerals  1 tablet Oral Daily  . pantoprazole  40 mg Oral BID AC  . sodium chloride  3 mL Intravenous Q12H  . sodium chloride  1 g Oral TID WC  . thiamine  100 mg Oral Daily   Or  . thiamine  100 mg Intravenous  Daily   Continuous:  MWN:UUVOZD chloride, alum & mag hydroxide-simeth, hydrALAZINE, HYDROmorphone (DILAUDID) injection, ondansetron (ZOFRAN) IV, ondansetron, oxyCODONE, polyethylene glycol, sodium chloride  Assesment: He was admitted with rib fracture and acute bronchitis. He does not seem to have pneumonia. He is being switched to oral medications and inhalers. He looks comfortable at rest. Principal Problem:   Acute bronchitis Active Problems:   ABUSE, ALCOHOL, UNSPECIFIED   HYPERTENSION  CORONARY ARTERY DISEASE   Rib fractures   Hyponatremia   UTI (urinary tract infection)    Plan: Is not clear that he has COPD. He should have pulmonary function testing as an outpatient if he will do that. I think he's okay for discharge    LOS: 4 days   Hildy Nicholl L 04/26/2014, 9:27 AM

## 2014-04-27 NOTE — Progress Notes (Signed)
UR review complete.  

## 2014-05-11 DIAGNOSIS — I259 Chronic ischemic heart disease, unspecified: Secondary | ICD-10-CM | POA: Diagnosis not present

## 2014-05-11 DIAGNOSIS — I10 Essential (primary) hypertension: Secondary | ICD-10-CM | POA: Diagnosis not present

## 2014-05-11 DIAGNOSIS — J209 Acute bronchitis, unspecified: Secondary | ICD-10-CM | POA: Diagnosis not present

## 2014-05-11 DIAGNOSIS — R609 Edema, unspecified: Secondary | ICD-10-CM | POA: Diagnosis not present

## 2014-05-18 DIAGNOSIS — S2239XA Fracture of one rib, unspecified side, initial encounter for closed fracture: Secondary | ICD-10-CM | POA: Diagnosis not present

## 2014-05-29 DIAGNOSIS — R5383 Other fatigue: Secondary | ICD-10-CM | POA: Diagnosis not present

## 2014-05-29 DIAGNOSIS — R0602 Shortness of breath: Secondary | ICD-10-CM | POA: Diagnosis not present

## 2014-05-29 DIAGNOSIS — Z23 Encounter for immunization: Secondary | ICD-10-CM | POA: Diagnosis not present

## 2014-05-29 DIAGNOSIS — R609 Edema, unspecified: Secondary | ICD-10-CM | POA: Diagnosis not present

## 2014-05-29 DIAGNOSIS — R600 Localized edema: Secondary | ICD-10-CM | POA: Diagnosis not present

## 2014-05-29 DIAGNOSIS — R000 Tachycardia, unspecified: Secondary | ICD-10-CM | POA: Diagnosis not present

## 2014-05-29 DIAGNOSIS — I498 Other specified cardiac arrhythmias: Secondary | ICD-10-CM | POA: Diagnosis not present

## 2014-06-05 ENCOUNTER — Ambulatory Visit (INDEPENDENT_AMBULATORY_CARE_PROVIDER_SITE_OTHER): Payer: Medicare Other | Admitting: Cardiology

## 2014-06-05 ENCOUNTER — Encounter: Payer: Self-pay | Admitting: Cardiology

## 2014-06-05 ENCOUNTER — Encounter (INDEPENDENT_AMBULATORY_CARE_PROVIDER_SITE_OTHER): Payer: Self-pay

## 2014-06-05 VITALS — BP 162/92 | HR 62 | Ht 65.0 in | Wt 187.0 lb

## 2014-06-05 DIAGNOSIS — I2510 Atherosclerotic heart disease of native coronary artery without angina pectoris: Secondary | ICD-10-CM

## 2014-06-05 DIAGNOSIS — I25810 Atherosclerosis of coronary artery bypass graft(s) without angina pectoris: Secondary | ICD-10-CM | POA: Diagnosis not present

## 2014-06-05 DIAGNOSIS — I10 Essential (primary) hypertension: Secondary | ICD-10-CM | POA: Diagnosis not present

## 2014-06-05 DIAGNOSIS — R600 Localized edema: Secondary | ICD-10-CM

## 2014-06-05 DIAGNOSIS — I499 Cardiac arrhythmia, unspecified: Secondary | ICD-10-CM | POA: Insufficient documentation

## 2014-06-05 DIAGNOSIS — I479 Paroxysmal tachycardia, unspecified: Secondary | ICD-10-CM

## 2014-06-05 MED ORDER — NITROGLYCERIN 0.4 MG SL SUBL: 0.4000 mg | SUBLINGUAL_TABLET | SUBLINGUAL | Status: DC | PRN

## 2014-06-05 MED ORDER — METOPROLOL TARTRATE 25 MG PO TABS: 25.0000 mg | ORAL_TABLET | Freq: Two times a day (BID) | ORAL | Status: DC

## 2014-06-05 NOTE — Assessment & Plan Note (Signed)
Likely multifactorial. He has low albumin stores based on review of lab work, probably liver disease with alcoholism. Echocardiogram will also be obtained to assess cardiac structure and function.

## 2014-06-05 NOTE — Assessment & Plan Note (Signed)
I reviewed the recent tracings. No clear evidence of atrial fibrillation. He did have frequent PACs and PVCs, also a brief burst of PAT. Resuming beta blocker at this time.

## 2014-06-05 NOTE — Assessment & Plan Note (Signed)
Starting Lopressor now. Will likely need additional therapy.

## 2014-06-05 NOTE — Assessment & Plan Note (Signed)
Not reporting any angina symptoms at present. Reviewed his old records. He has history of multivessel disease that was managed medically by Dr. Amil AmenEdmunds years ago. Plan is to continue aspirin, we gave him prescriptions for nitroglycerin and Lopressor. He will likely need additional blood pressure control measures, although would hold off on ACE inhibitor and Norvasc in light of prior intolerances. Echocardiogram will be obtained to define cardiac structure and function. Keep followup for lab work with Dr. Margo AyeHall. We will see him back in the next few months.

## 2014-06-05 NOTE — Patient Instructions (Addendum)
Your physician recommends that you schedule a follow-up appointment in: 2 months    Your physician has recommended you make the following change in your medication:     START Lopressor 25 mg twice a day  DECREASE Aspirin to 81 mg daily  START Omega 3 Fish Oil, take 2-3 tablets daily  Use Nitroglycerine 0.4 mg SL for chest pain     Your physician has requested that you have an echocardiogram. Echocardiography is a painless test that uses sound waves to create images of your heart. It provides your doctor with information about the size and shape of your heart and how well your heart's chambers and valves are working. This procedure takes approximately one hour. There are no restrictions for this procedure.          Thank you for choosing [facility] !

## 2014-06-05 NOTE — Progress Notes (Signed)
Clinical Summary [name] is a 63 y.o.male referred for cardiology consultation by Dr. Margo Stone, with whom he recently established for primary care. I reviewed available records finding prior cardiac history, detailed below. He tells me that [place] in [place], worked as a Environmental managerphotographer, got his health care through [facility] until it closed. He now lives in [place].  Patient reportedly had a heart catheterization back in 1999 that revealed 50-70% second diagonal stenosis, 50% first diagonal stenosis, 80% mid circumflex stenosis, 50% obtuse marginal stenosis, and occluded RCA at the midportion associated with left to right collaterals from the LAD. He was treated medically by Dr. Amil Stone, has had no regular cardiology followup over the years.  Recent tracings from Dr. Scharlene Stone's office reviewed. These show sinus rhythm and sinus tachycardia with frequent PACs and PVCs, brief bursts of SVT, no definite atrial fibrillation.  ECG from August showed normal sinus rhythm. Prior echocardiogram from 2011 reported mild LVH with LVEF 55-60%, mild left atrial enlargement, PASP 33 mm mercury.  Fortunately, he reports no significant angina symptoms for several years. He does not have nitroglycerin at home. He was previously on Lopressor long-term, but has not taken any since August. He was not given recent refill.  He also tells me that he was on lisinopril in the past, although this led to "electrolyte problems" and he has not taken it since. Reports allergy to Norvasc related to leg swelling.  He reports good appetite, he has chronic problems with leg swelling and abdominal bloating. He tells that he is to have lab work with Dr. Margo Stone in November with followup visit    Allergies  Allergen Reactions  . Amlodipine Besylate     REACTION: Feet swell  . Penicillins Other (See Comments)    Childhood allergy    Current Outpatient Prescriptions  Medication Sig Dispense Refill  . albuterol  (PROVENTIL HFA;VENTOLIN HFA) 108 (90 BASE) MCG/ACT inhaler Inhale 2 puffs into the lungs every 6 (six) hours as needed for wheezing or shortness of breath.  1 Inhaler  2  . aspirin 81 MG tablet Take 81 mg by mouth daily.      . B Complex-C (SUPER B COMPLEX PO) Take 1 tablet by mouth daily.      . chlorpheniramine (EQ CHLORTABS) 4 MG tablet Take 4 mg by mouth every 4 (four) hours as needed for allergies.      Marland Kitchen. docusate sodium 100 MG CAPS Take 100 mg by mouth 2 (two) times daily. For constipation  60 capsule  0  . fluticasone (FLONASE) 50 MCG/ACT nasal spray Place 1 spray into both nostrils daily.  16 g  2  . Omega-3 Fatty Acids (FISH OIL) 1000 MG CAPS Take by mouth. 2-3 tabs per day      . sodium chloride 1 G tablet Take 1 tablet (1 g total) by mouth 2 (two) times daily with a meal.  60 tablet  3  . metoprolol tartrate (LOPRESSOR) 25 MG tablet Take 1 tablet (25 mg total) by mouth 2 (two) times daily.  180 tablet  3  . nitroGLYCERIN (NITROSTAT) 0.4 MG SL tablet Place 1 tablet (0.4 mg total) under the tongue every 5 (five) minutes as needed for chest pain.  25 tablet  3   No current facility-administered medications for this visit.    Past Medical History  Diagnosis Date  . CAD (coronary artery disease), native coronary artery     Multivessel at cardiac catheterization 1999 - managed medically by Dr.  Edmunds  . Essential hypertension   . Alcoholism   . Allergic rhinitis   . Prostatic hypertrophy   . Glucose intolerance (pre-diabetes)   . Rib fractures   . History of SIADH   . History of UTI   . Bronchitis     Past Surgical History  Procedure Laterality Date  . Splenectomy    . Vasectomy    . Knee arthroplasty Right     Family History  Problem Relation Age of Onset  . Heart disease Father     Social History [name] reports that he has quit smoking. His smoking use included Cigarettes. He smoked 0.00 packs per day. He does not have any smokeless tobacco history on file. Mr.  Billy Stone reports that he drinks alcohol.  Review of Systems No palpitations. Has trouble sleeping. Leg edema seems to be better when he puts his legs up. Other systems reviewed and negative except as outlined.  Physical Examination Filed Vitals:   06/05/14 1357  BP: 162/92  Pulse: 62   Filed Weights   06/05/14 1357  Weight: 187 lb (84.823 kg)   Overweight male, no distress HEENT: Conjunctiva and lids normal, oropharynx clear. Neck: Supple, no elevated JVP or carotid bruits, no thyromegaly. Lungs: Clear to auscultation, nonlabored breathing at rest. Cardiac: Regular rate and rhythm, S4, no significant systolic murmur, no pericardial rub. Abdomen: Protuberant, nontender, bowel sounds present, no guarding or rebound. Extremities: 2+ soft edema, distal pulses 2+. Skin: Warm and dry. Musculoskeletal: No kyphosis. Neuropsychiatric: Alert and oriented x3, affect grossly appropriate.   Problem List and Plan   CAD (coronary artery disease), native coronary artery Not reporting any angina symptoms at present. Reviewed his old records. He has history of multivessel disease that was managed medically by Dr. Amil Stone years ago. Plan is to continue aspirin, we gave him prescriptions for nitroglycerin and Lopressor. He will likely need additional blood pressure control measures, although would hold off on ACE inhibitor and Norvasc in light of prior intolerances. Echocardiogram will be obtained to define cardiac structure and function. Keep followup for lab work with Dr. Margo Stone. We will see him back in the next few months.  Essential hypertension Starting Lopressor now. Will likely need additional therapy.  Bilateral leg edema Likely multifactorial. He has low albumin stores based on review of lab work, probably liver disease with alcoholism. Echocardiogram will also be obtained to assess cardiac structure and function.  Arrhythmia I reviewed the recent tracings. No clear evidence of atrial  fibrillation. He did have frequent PACs and PVCs, also a brief burst of PAT. Resuming beta blocker at this time.    Jonelle SidleSamuel G. Mikesha Migliaccio, M.D., F.A.C.C.

## 2014-06-08 ENCOUNTER — Ambulatory Visit (HOSPITAL_COMMUNITY)
Admission: RE | Admit: 2014-06-08 | Discharge: 2014-06-08 | Disposition: A | Discharge status: Home / Self Care | Payer: Medicare Other | Source: Ambulatory Visit | Attending: Cardiology | Admitting: Cardiology

## 2014-06-08 DIAGNOSIS — I499 Cardiac arrhythmia, unspecified: Secondary | ICD-10-CM | POA: Diagnosis not present

## 2014-06-08 DIAGNOSIS — I379 Nonrheumatic pulmonary valve disorder, unspecified: Secondary | ICD-10-CM | POA: Diagnosis not present

## 2014-06-08 DIAGNOSIS — I10 Essential (primary) hypertension: Secondary | ICD-10-CM | POA: Insufficient documentation

## 2014-06-08 DIAGNOSIS — I2510 Atherosclerotic heart disease of native coronary artery without angina pectoris: Secondary | ICD-10-CM | POA: Insufficient documentation

## 2014-06-08 DIAGNOSIS — Z87891 Personal history of nicotine dependence: Secondary | ICD-10-CM | POA: Insufficient documentation

## 2014-06-08 DIAGNOSIS — I25810 Atherosclerosis of coronary artery bypass graft(s) without angina pectoris: Secondary | ICD-10-CM

## 2014-06-08 DIAGNOSIS — I071 Rheumatic tricuspid insufficiency: Secondary | ICD-10-CM | POA: Diagnosis not present

## 2014-06-08 NOTE — Progress Notes (Signed)
  Echocardiogram 2D Echocardiogram has been performed.  Billy Stone 06/08/2014, 11:38 AM

## 2014-06-11 DIAGNOSIS — I10 Essential (primary) hypertension: Secondary | ICD-10-CM | POA: Diagnosis not present

## 2014-06-11 DIAGNOSIS — J449 Chronic obstructive pulmonary disease, unspecified: Secondary | ICD-10-CM | POA: Diagnosis not present

## 2014-06-11 DIAGNOSIS — I2510 Atherosclerotic heart disease of native coronary artery without angina pectoris: Secondary | ICD-10-CM | POA: Diagnosis not present

## 2014-06-25 IMAGING — US US ABDOMEN LIMITED
1 series · 14 of 25 positions shown · non-contrast
Comparison: Ultrasound dated [DATE]

CLINICAL DATA: Alcoholic cirrhosis x20 years.

EXAM:
US ABDOMEN LIMITED - RIGHT UPPER QUADRANT

[Series 1: us abdomen limited · 0.16mm/px · 14 of 59 slices shown]
[im 1/59]
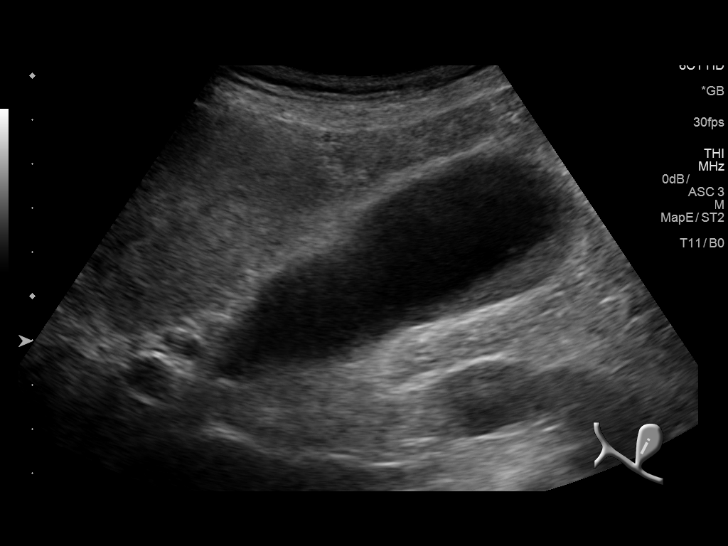
[im 5/59]
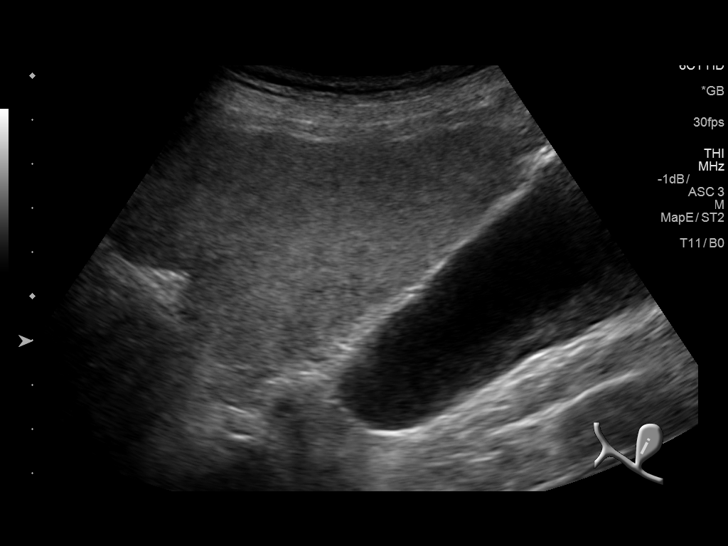
[im 10/59]
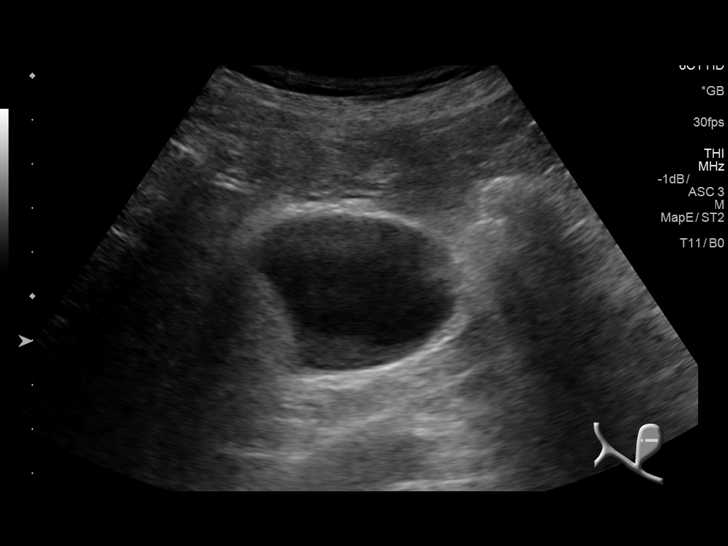
[im 15/59]
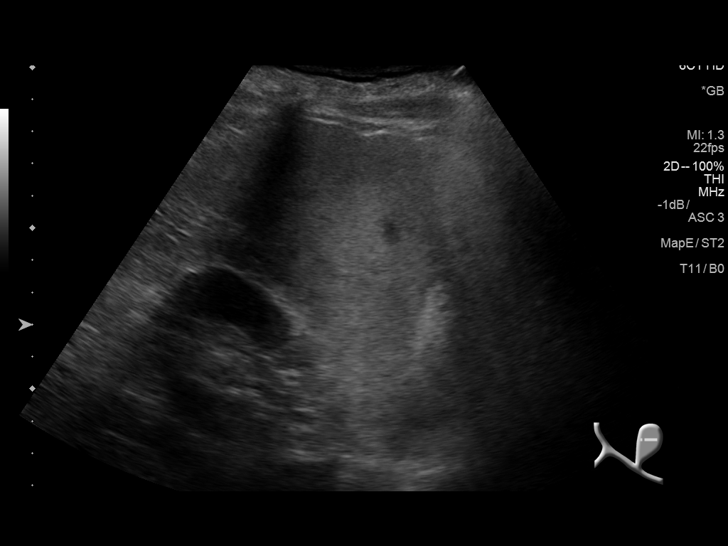
[im 20/59]
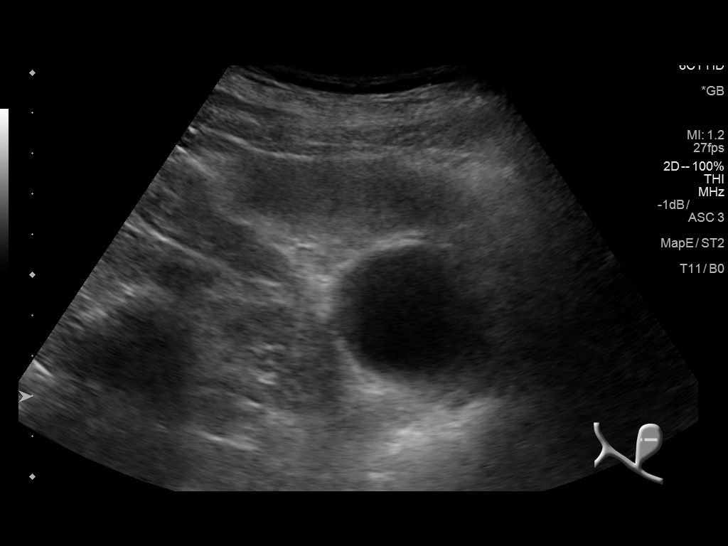
[im 22/59]
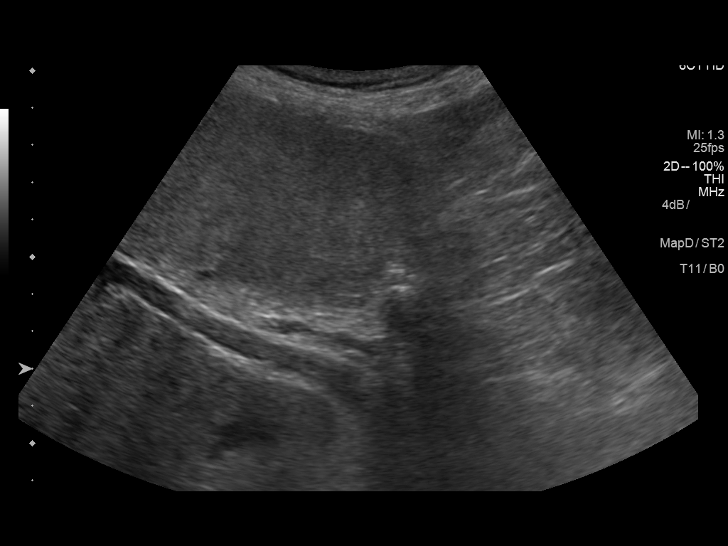
[im 27/59]
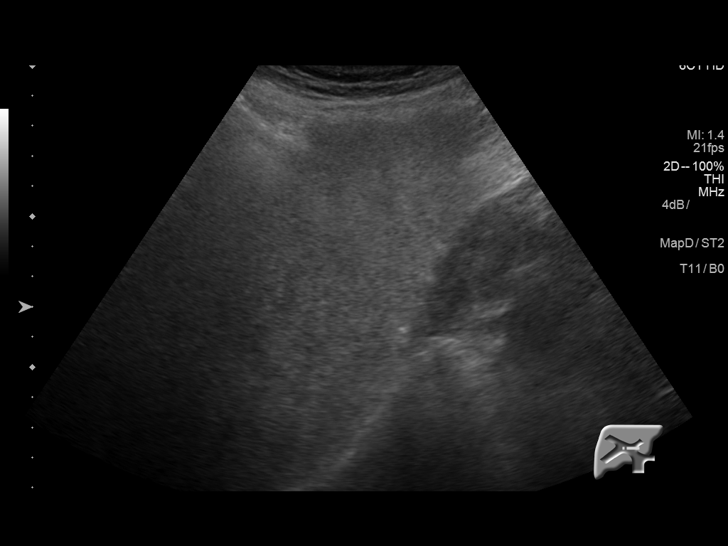
[im 32/59]
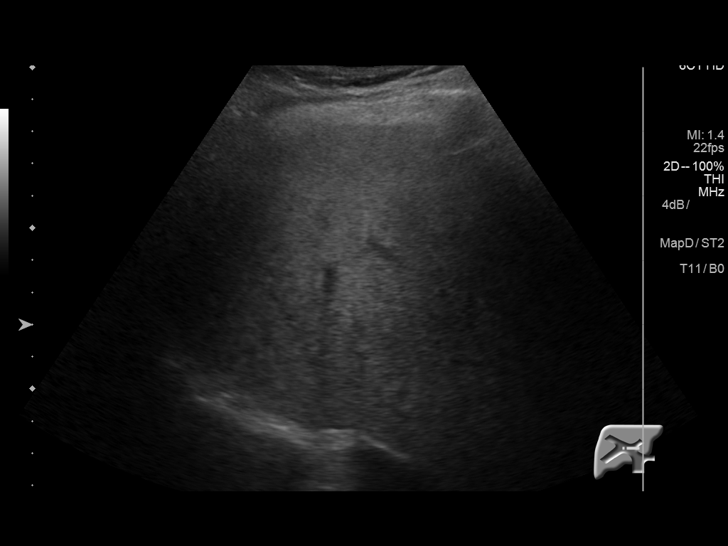
[im 37/59]
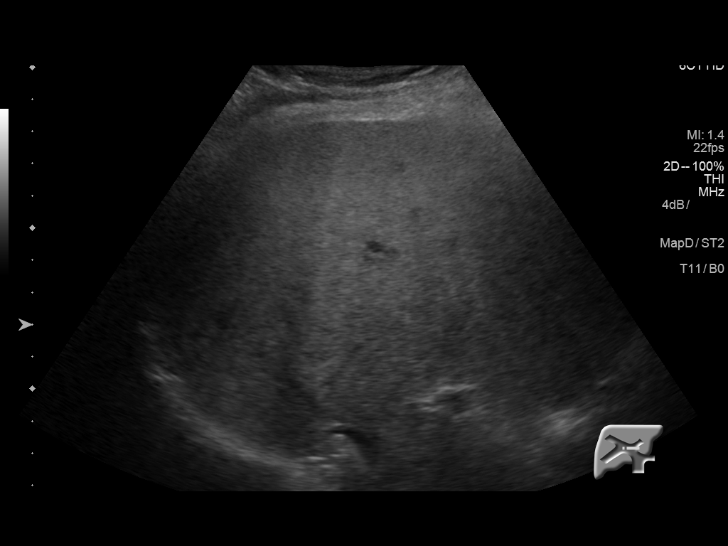
[im 39/59]
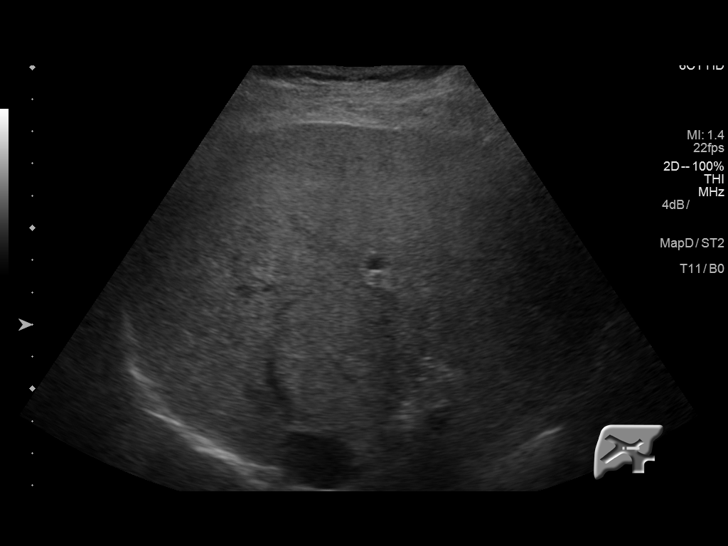
[im 44/59]
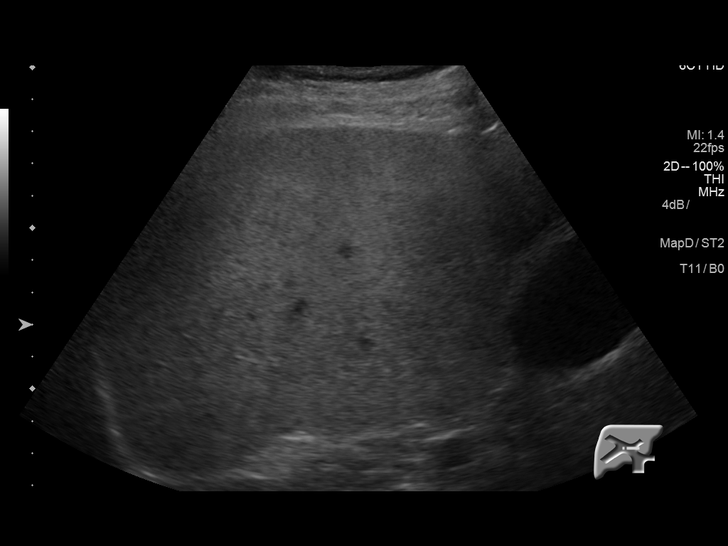
[im 49/59]
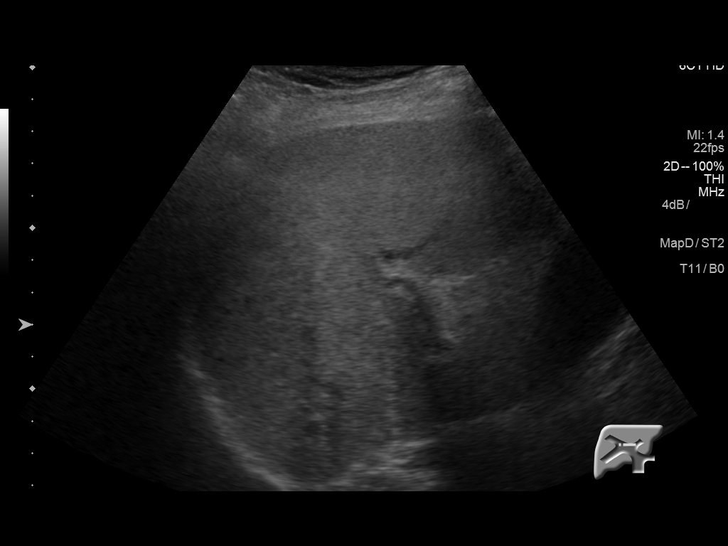
[im 54/59]
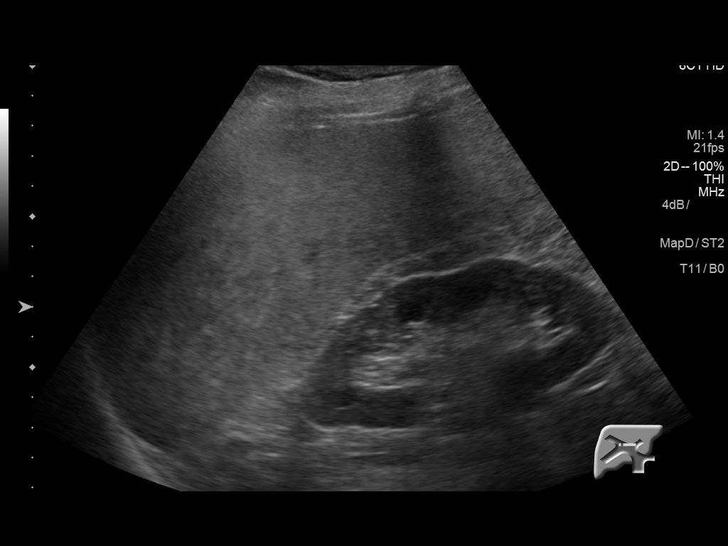
[im 59/59]
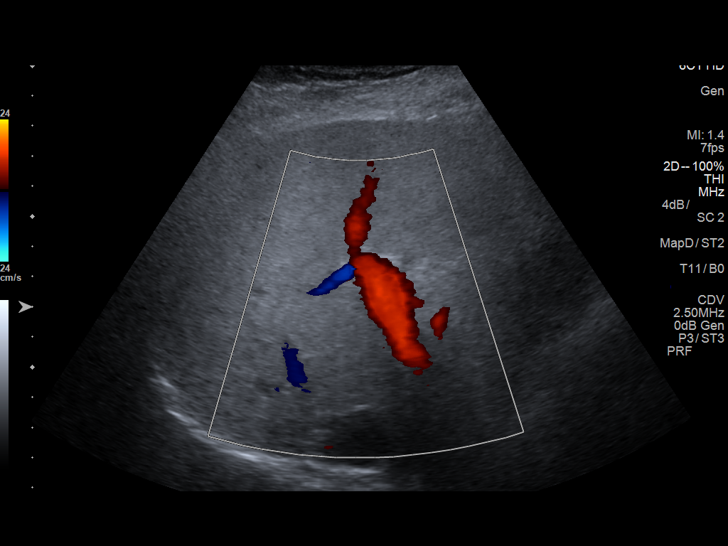

[14 of 25 positions shown; findings below may reference images not displayed]

FINDINGS: Gallbladder:

Layering gallbladder sludge. No gallbladder wall thickening or
pericholecystic fluid. Negative sonographic Murphy's sign.

Common bile duct:

Diameter: 5 mm.

Liver:

Nodular hepatic contour with coarse hepatic echotexture. Hyperechoic
hepatic parenchyma. No focal hepatic lesion is seen.
IMPRESSION: Gallbladder sludge.  No evidence of acute cholecystitis.

Cirrhosis. Possible superimposed hepatic steatosis. No focal hepatic
lesion is seen.

## 2014-07-07 DIAGNOSIS — E871 Hypo-osmolality and hyponatremia: Secondary | ICD-10-CM | POA: Diagnosis not present

## 2014-07-07 DIAGNOSIS — I10 Essential (primary) hypertension: Secondary | ICD-10-CM | POA: Diagnosis not present

## 2014-07-21 DIAGNOSIS — J449 Chronic obstructive pulmonary disease, unspecified: Secondary | ICD-10-CM | POA: Diagnosis not present

## 2014-07-21 DIAGNOSIS — I2510 Atherosclerotic heart disease of native coronary artery without angina pectoris: Secondary | ICD-10-CM | POA: Diagnosis not present

## 2014-07-21 DIAGNOSIS — I10 Essential (primary) hypertension: Secondary | ICD-10-CM | POA: Diagnosis not present

## 2014-08-04 DIAGNOSIS — E871 Hypo-osmolality and hyponatremia: Secondary | ICD-10-CM | POA: Diagnosis not present

## 2014-08-14 ENCOUNTER — Encounter: Payer: Self-pay | Admitting: Cardiology

## 2014-08-14 ENCOUNTER — Ambulatory Visit (INDEPENDENT_AMBULATORY_CARE_PROVIDER_SITE_OTHER): Payer: Medicare Other | Admitting: Cardiology

## 2014-08-14 VITALS — BP 142/90 | HR 79 | Ht 65.0 in | Wt 196.0 lb

## 2014-08-14 DIAGNOSIS — I2510 Atherosclerotic heart disease of native coronary artery without angina pectoris: Secondary | ICD-10-CM | POA: Diagnosis not present

## 2014-08-14 DIAGNOSIS — I10 Essential (primary) hypertension: Secondary | ICD-10-CM | POA: Diagnosis not present

## 2014-08-14 MED ORDER — METOPROLOL TARTRATE 50 MG PO TABS: 50.0000 mg | ORAL_TABLET | Freq: Two times a day (BID) | ORAL | Status: DC

## 2014-08-14 NOTE — Patient Instructions (Addendum)
Your physician wants you to follow-up in: 6 months You will receive a reminder letter in the mail two months in advance. If you don't receive a letter, please call our office to schedule the follow-up appointment.       Your physician has recommended you make the following change in your medication:     INCREASE Metoprolol to 50 mg twice a day     Thank you for choosing [facility] !

## 2014-08-14 NOTE — Progress Notes (Signed)
Reason for visit: CAD, hypertension  Clinical Summary Mr. Fran LowesHolder is a 63 y.o.male seen for the first time in October. He presents for a routine follow-up visit today. He reports no numbness symptoms, no nitroglycerin use. Indicates that he has been taking Lopressor as directed and tolerating it. He continues to follow with Dr. Margo AyeHall.  Echocardiogram from October showed mild to moderate LVH with LVEF 60-65%, grade 1 diastolic dysfunction, normal wall motion, mild aortic root dilatation (42 mm}, mild left atrial enlargement, mild tricuspid regurgitation with PASP 26 mmHg. we reviewed these results today.  Lab work from August showed sodium 125, potassium 4.2, BUN 5, creatinine 0.4.  Patient reportedly had a heart catheterization back in 1999 that revealed 50-70% second diagonal stenosis, 50% first diagonal stenosis, 80% mid circumflex stenosis, 50% obtuse marginal stenosis, and occluded RCA at the midportion associated with left to right collaterals from the LAD. He was treated medically by Dr. Amil AmenEdmunds, has had no regular cardiology followup over the years.  Allergies  Allergen Reactions  . Amlodipine Besylate     REACTION: Feet swell  . Penicillins Other (See Comments)    Childhood allergy    Current Outpatient Prescriptions  Medication Sig Dispense Refill  . albuterol (PROVENTIL HFA;VENTOLIN HFA) 108 (90 BASE) MCG/ACT inhaler Inhale 2 puffs into the lungs every 6 (six) hours as needed for wheezing or shortness of breath. 1 Inhaler 2  . aspirin 81 MG tablet Take 81 mg by mouth daily.    . B Complex-C (SUPER B COMPLEX PO) Take 1 tablet by mouth daily.    . chlorpheniramine (EQ CHLORTABS) 4 MG tablet Take 4 mg by mouth every 4 (four) hours as needed for allergies.    Marland Kitchen. docusate sodium 100 MG CAPS Take 100 mg by mouth 2 (two) times daily. For constipation 60 capsule 0  . fluticasone (FLONASE) 50 MCG/ACT nasal spray Place 1 spray into both nostrils daily. 16 g 2  . metoprolol tartrate  (LOPRESSOR) 25 MG tablet Take 1 tablet (25 mg total) by mouth 2 (two) times daily. 180 tablet 3  . nitroGLYCERIN (NITROSTAT) 0.4 MG SL tablet Place 1 tablet (0.4 mg total) under the tongue every 5 (five) minutes as needed for chest pain. 25 tablet 3  . Omega-3 Fatty Acids (FISH OIL) 1000 MG CAPS Take by mouth. 2-3 tabs per day    . sodium chloride 1 G tablet Take 1 tablet (1 g total) by mouth 2 (two) times daily with a meal. 60 tablet 3   No current facility-administered medications for this visit.    Past Medical History  Diagnosis Date  . CAD (coronary artery disease), native coronary artery     Multivessel at cardiac catheterization 1999 - managed medically by Dr. Amil AmenEdmunds  . Essential hypertension   . Alcoholism   . Allergic rhinitis   . Prostatic hypertrophy   . Glucose intolerance (pre-diabetes)   . Rib fractures   . History of SIADH   . History of UTI   . Bronchitis     Social History Mr. Fran LowesHolder reports that he quit smoking about 26 years ago. His smoking use included Cigarettes. He started smoking about 47 years ago. He has a 21 pack-year smoking history. He has never used smokeless tobacco. Mr. Fran LowesHolder reports that he drinks about 25.2 oz of alcohol per week.  Review of Systems Complete review of systems negative except as otherwise outlined in the clinical summary and also the following. No palpitations or syncope. No claudication. Chronic  arthritic pains.  Physical Examination Filed Vitals:   08/14/14 0851  BP: 142/90  Pulse: 79   Filed Weights   08/14/14 0851  Weight: 196 lb (88.905 kg)    Overweight male, no distress HEENT: Conjunctiva and lids normal, oropharynx clear. Neck: Supple, no elevated JVP or carotid bruits, no thyromegaly. Lungs: Clear to auscultation, nonlabored breathing at rest. Cardiac: Regular rate and rhythm, S4, no significant systolic murmur, no pericardial rub. Abdomen: Protuberant, nontender, bowel sounds present, no guarding or  rebound. Extremities: 1+ soft edema, distal pulses 2+.   Problem List and Plan   CAD (coronary artery disease), native coronary artery Symptomatically stable without active angina. Plan is to continue medical therapy at this point. He is on aspirin, beta blocker, omega-3 supplements, and nitroglycerin as needed.  Essential hypertension Blood pressure mildly elevated today. Increase Lopressor to 50 g twice daily, particularly with mild aortic root dilatation. May need additional agent later. Would hold off Norvasc and ACE inhibitor given prior intolerances. Could consider hydralazine.    Jonelle SidleSamuel G. Ellanie Oppedisano, M.D., F.A.C.C.

## 2014-08-14 NOTE — Assessment & Plan Note (Signed)
Symptomatically stable without active angina. Plan is to continue medical therapy at this point. He is on aspirin, beta blocker, omega-3 supplements, and nitroglycerin as needed.

## 2014-08-14 NOTE — Assessment & Plan Note (Signed)
Blood pressure mildly elevated today. Increase Lopressor to 50 g twice daily, particularly with mild aortic root dilatation. May need additional agent later. Would hold off Norvasc and ACE inhibitor given prior intolerances. Could consider hydralazine.

## 2014-10-21 DIAGNOSIS — I10 Essential (primary) hypertension: Secondary | ICD-10-CM | POA: Diagnosis not present

## 2014-10-21 DIAGNOSIS — I2510 Atherosclerotic heart disease of native coronary artery without angina pectoris: Secondary | ICD-10-CM | POA: Diagnosis not present

## 2014-10-21 DIAGNOSIS — J449 Chronic obstructive pulmonary disease, unspecified: Secondary | ICD-10-CM | POA: Diagnosis not present

## 2014-11-30 DIAGNOSIS — M791 Myalgia: Secondary | ICD-10-CM | POA: Diagnosis not present

## 2014-11-30 DIAGNOSIS — K7030 Alcoholic cirrhosis of liver without ascites: Secondary | ICD-10-CM | POA: Diagnosis not present

## 2014-11-30 DIAGNOSIS — R5383 Other fatigue: Secondary | ICD-10-CM | POA: Diagnosis not present

## 2014-11-30 DIAGNOSIS — E222 Syndrome of inappropriate secretion of antidiuretic hormone: Secondary | ICD-10-CM | POA: Diagnosis not present

## 2014-11-30 DIAGNOSIS — Z79899 Other long term (current) drug therapy: Secondary | ICD-10-CM | POA: Diagnosis not present

## 2014-11-30 DIAGNOSIS — I10 Essential (primary) hypertension: Secondary | ICD-10-CM | POA: Diagnosis not present

## 2014-11-30 DIAGNOSIS — K759 Inflammatory liver disease, unspecified: Secondary | ICD-10-CM | POA: Diagnosis not present

## 2014-11-30 DIAGNOSIS — E871 Hypo-osmolality and hyponatremia: Secondary | ICD-10-CM | POA: Diagnosis not present

## 2014-12-14 ENCOUNTER — Encounter (HOSPITAL_COMMUNITY): Payer: Self-pay

## 2014-12-14 ENCOUNTER — Inpatient Hospital Stay (HOSPITAL_COMMUNITY): Payer: Medicare Other

## 2014-12-14 ENCOUNTER — Inpatient Hospital Stay (HOSPITAL_COMMUNITY)
Admission: EM | Admit: 2014-12-14 | Discharge: 2014-12-17 | DRG: 432 | Disposition: A | Discharge status: Skilled Nursing Facility | Payer: Medicare Other | Attending: Internal Medicine | Admitting: Internal Medicine

## 2014-12-14 ENCOUNTER — Emergency Department (HOSPITAL_COMMUNITY): Payer: Medicare Other

## 2014-12-14 DIAGNOSIS — R339 Retention of urine, unspecified: Secondary | ICD-10-CM | POA: Diagnosis present

## 2014-12-14 DIAGNOSIS — K828 Other specified diseases of gallbladder: Secondary | ICD-10-CM | POA: Diagnosis not present

## 2014-12-14 DIAGNOSIS — K7460 Unspecified cirrhosis of liver: Secondary | ICD-10-CM | POA: Diagnosis present

## 2014-12-14 DIAGNOSIS — F1010 Alcohol abuse, uncomplicated: Secondary | ICD-10-CM | POA: Diagnosis present

## 2014-12-14 DIAGNOSIS — K759 Inflammatory liver disease, unspecified: Secondary | ICD-10-CM | POA: Diagnosis not present

## 2014-12-14 DIAGNOSIS — R600 Localized edema: Secondary | ICD-10-CM | POA: Diagnosis not present

## 2014-12-14 DIAGNOSIS — Z880 Allergy status to penicillin: Secondary | ICD-10-CM

## 2014-12-14 DIAGNOSIS — K7011 Alcoholic hepatitis with ascites: Secondary | ICD-10-CM | POA: Diagnosis present

## 2014-12-14 DIAGNOSIS — R188 Other ascites: Secondary | ICD-10-CM | POA: Diagnosis not present

## 2014-12-14 DIAGNOSIS — E8770 Fluid overload, unspecified: Secondary | ICD-10-CM | POA: Diagnosis present

## 2014-12-14 DIAGNOSIS — K7469 Other cirrhosis of liver: Secondary | ICD-10-CM | POA: Diagnosis not present

## 2014-12-14 DIAGNOSIS — E876 Hypokalemia: Secondary | ICD-10-CM | POA: Diagnosis present

## 2014-12-14 DIAGNOSIS — M7989 Other specified soft tissue disorders: Secondary | ICD-10-CM | POA: Diagnosis not present

## 2014-12-14 DIAGNOSIS — K7200 Acute and subacute hepatic failure without coma: Secondary | ICD-10-CM | POA: Diagnosis present

## 2014-12-14 DIAGNOSIS — R609 Edema, unspecified: Secondary | ICD-10-CM

## 2014-12-14 DIAGNOSIS — K7030 Alcoholic cirrhosis of liver without ascites: Secondary | ICD-10-CM | POA: Diagnosis present

## 2014-12-14 DIAGNOSIS — W19XXXA Unspecified fall, initial encounter: Secondary | ICD-10-CM | POA: Diagnosis present

## 2014-12-14 DIAGNOSIS — E860 Dehydration: Secondary | ICD-10-CM | POA: Diagnosis present

## 2014-12-14 DIAGNOSIS — F1021 Alcohol dependence, in remission: Secondary | ICD-10-CM | POA: Diagnosis present

## 2014-12-14 DIAGNOSIS — E222 Syndrome of inappropriate secretion of antidiuretic hormone: Secondary | ICD-10-CM | POA: Diagnosis present

## 2014-12-14 DIAGNOSIS — K7031 Alcoholic cirrhosis of liver with ascites: Secondary | ICD-10-CM | POA: Diagnosis not present

## 2014-12-14 DIAGNOSIS — K921 Melena: Secondary | ICD-10-CM | POA: Diagnosis present

## 2014-12-14 DIAGNOSIS — R5383 Other fatigue: Secondary | ICD-10-CM | POA: Diagnosis not present

## 2014-12-14 DIAGNOSIS — R531 Weakness: Secondary | ICD-10-CM

## 2014-12-14 DIAGNOSIS — R000 Tachycardia, unspecified: Secondary | ICD-10-CM | POA: Diagnosis present

## 2014-12-14 DIAGNOSIS — I10 Essential (primary) hypertension: Secondary | ICD-10-CM | POA: Diagnosis present

## 2014-12-14 DIAGNOSIS — I2510 Atherosclerotic heart disease of native coronary artery without angina pectoris: Secondary | ICD-10-CM | POA: Diagnosis present

## 2014-12-14 DIAGNOSIS — Z888 Allergy status to other drugs, medicaments and biological substances status: Secondary | ICD-10-CM | POA: Diagnosis not present

## 2014-12-14 DIAGNOSIS — E43 Unspecified severe protein-calorie malnutrition: Secondary | ICD-10-CM

## 2014-12-14 DIAGNOSIS — R0989 Other specified symptoms and signs involving the circulatory and respiratory systems: Secondary | ICD-10-CM | POA: Diagnosis not present

## 2014-12-14 DIAGNOSIS — Z87440 Personal history of urinary (tract) infections: Secondary | ICD-10-CM

## 2014-12-14 DIAGNOSIS — Z87891 Personal history of nicotine dependence: Secondary | ICD-10-CM | POA: Diagnosis not present

## 2014-12-14 DIAGNOSIS — F17210 Nicotine dependence, cigarettes, uncomplicated: Secondary | ICD-10-CM | POA: Diagnosis present

## 2014-12-14 DIAGNOSIS — E871 Hypo-osmolality and hyponatremia: Secondary | ICD-10-CM | POA: Insufficient documentation

## 2014-12-14 DIAGNOSIS — N400 Enlarged prostate without lower urinary tract symptoms: Secondary | ICD-10-CM | POA: Diagnosis present

## 2014-12-14 DIAGNOSIS — J9811 Atelectasis: Secondary | ICD-10-CM | POA: Diagnosis not present

## 2014-12-14 DIAGNOSIS — R404 Transient alteration of awareness: Secondary | ICD-10-CM | POA: Diagnosis not present

## 2014-12-14 DIAGNOSIS — Z7982 Long term (current) use of aspirin: Secondary | ICD-10-CM | POA: Diagnosis not present

## 2014-12-14 DIAGNOSIS — R278 Other lack of coordination: Secondary | ICD-10-CM | POA: Diagnosis present

## 2014-12-14 DIAGNOSIS — R17 Unspecified jaundice: Secondary | ICD-10-CM | POA: Diagnosis present

## 2014-12-14 HISTORY — DX: Weakness: R53.1

## 2014-12-14 LAB — PROTIME-INR
INR: 1.86 — ABNORMAL HIGH (ref 0.00–1.49)
PROTHROMBIN TIME: 21.6 s — AB (ref 11.6–15.2)

## 2014-12-14 LAB — COMPREHENSIVE METABOLIC PANEL
ALBUMIN: 1.8 g/dL — AB (ref 3.5–5.2)
ALT: 26 U/L (ref 0–53)
AST: 109 U/L — ABNORMAL HIGH (ref 0–37)
Alkaline Phosphatase: 216 U/L — ABNORMAL HIGH (ref 39–117)
Anion gap: 14 (ref 5–15)
BILIRUBIN TOTAL: 13.1 mg/dL — AB (ref 0.3–1.2)
BUN: 16 mg/dL (ref 6–23)
CHLORIDE: 86 mmol/L — AB (ref 96–112)
CO2: 27 mmol/L (ref 19–32)
Calcium: 7.6 mg/dL — ABNORMAL LOW (ref 8.4–10.5)
Creatinine, Ser: 1.1 mg/dL (ref 0.50–1.35)
GFR calc Af Amer: 79 mL/min — ABNORMAL LOW (ref >90)
GFR calc non Af Amer: 68 mL/min — ABNORMAL LOW (ref >90)
GLUCOSE: 112 mg/dL — AB (ref 70–99)
POTASSIUM: 2.6 mmol/L — AB (ref 3.5–5.1)
Sodium: 127 mmol/L — ABNORMAL LOW (ref 135–145)
TOTAL PROTEIN: 6.5 g/dL (ref 6.0–8.3)

## 2014-12-14 LAB — CBC WITH DIFFERENTIAL/PLATELET
BASOS ABS: 0 10*3/uL (ref 0.0–0.1)
BASOS PCT: 0 % (ref 0–1)
Band Neutrophils: 0 % (ref 0–10)
Blasts: 0 %
EOS ABS: 0 10*3/uL (ref 0.0–0.7)
EOS PCT: 0 % (ref 0–5)
HCT: 33.6 % — ABNORMAL LOW (ref 39.0–52.0)
HEMOGLOBIN: 12 g/dL — AB (ref 13.0–17.0)
Lymphocytes Relative: 20 % (ref 12–46)
Lymphs Abs: 3.7 10*3/uL (ref 0.7–4.0)
MCH: 34.5 pg — AB (ref 26.0–34.0)
MCHC: 35.7 g/dL (ref 30.0–36.0)
MCV: 96.6 fL (ref 78.0–100.0)
METAMYELOCYTES PCT: 0 %
Monocytes Absolute: 1.7 10*3/uL — ABNORMAL HIGH (ref 0.1–1.0)
Monocytes Relative: 9 % (ref 3–12)
Myelocytes: 0 %
Neutro Abs: 13.3 10*3/uL — ABNORMAL HIGH (ref 1.7–7.7)
Neutrophils Relative %: 71 % (ref 43–77)
Platelets: 278 10*3/uL (ref 150–400)
Promyelocytes Absolute: 0 %
RBC: 3.48 MIL/uL — AB (ref 4.22–5.81)
RDW: 18.6 % — ABNORMAL HIGH (ref 11.5–15.5)
WBC: 18.7 10*3/uL — ABNORMAL HIGH (ref 4.0–10.5)
nRBC: 0 /100 WBC

## 2014-12-14 LAB — CREATININE, URINE, RANDOM: Creatinine, Urine: 51.91 mg/dL

## 2014-12-14 LAB — ACETAMINOPHEN LEVEL: Acetaminophen (Tylenol), Serum: <10 ug/mL — ABNORMAL LOW (ref 10–30)

## 2014-12-14 LAB — URINALYSIS, ROUTINE W REFLEX MICROSCOPIC
GLUCOSE, UA: 100 mg/dL — AB
Nitrite: NEGATIVE
SPECIFIC GRAVITY, URINE: 1.01 (ref 1.005–1.030)
Urobilinogen, UA: 4 mg/dL — ABNORMAL HIGH (ref 0.0–1.0)
pH: 5 (ref 5.0–8.0)

## 2014-12-14 LAB — BASIC METABOLIC PANEL
Anion gap: 15 (ref 5–15)
BUN: 16 mg/dL (ref 6–23)
CO2: 25 mmol/L (ref 19–32)
CREATININE: 1 mg/dL (ref 0.50–1.35)
Calcium: 7.5 mg/dL — ABNORMAL LOW (ref 8.4–10.5)
Chloride: 88 mmol/L — ABNORMAL LOW (ref 96–112)
GFR calc Af Amer: 89 mL/min — ABNORMAL LOW (ref >90)
GFR calc non Af Amer: 76 mL/min — ABNORMAL LOW (ref >90)
Glucose, Bld: 112 mg/dL — ABNORMAL HIGH (ref 70–99)
POTASSIUM: 2.8 mmol/L — AB (ref 3.5–5.1)
SODIUM: 128 mmol/L — AB (ref 135–145)

## 2014-12-14 LAB — TROPONIN I

## 2014-12-14 LAB — MAGNESIUM: Magnesium: 1.2 mg/dL — ABNORMAL LOW (ref 1.5–2.5)

## 2014-12-14 LAB — ETHANOL

## 2014-12-14 LAB — URINE MICROSCOPIC-ADD ON

## 2014-12-14 LAB — LIPASE, BLOOD: Lipase: 23 U/L (ref 11–59)

## 2014-12-14 LAB — SODIUM, URINE, RANDOM: Sodium, Ur: 29 mmol/L

## 2014-12-14 LAB — BRAIN NATRIURETIC PEPTIDE: B Natriuretic Peptide: 250 pg/mL — ABNORMAL HIGH (ref 0.0–100.0)

## 2014-12-14 LAB — HEPATITIS PANEL, ACUTE
HCV Ab: NEGATIVE
HEP A IGM: NONREACTIVE
Hep B C IgM: NONREACTIVE
Hepatitis B Surface Ag: NEGATIVE

## 2014-12-14 LAB — OCCULT BLOOD, POC DEVICE: Fecal Occult Bld: NEGATIVE

## 2014-12-14 LAB — AMMONIA: Ammonia: 93 umol/L — ABNORMAL HIGH (ref 11–32)

## 2014-12-14 IMAGING — CR DG CHEST 1V PORT
1 series · 2 of 2 positions shown · non-contrast
Comparison: Prior radiograph from [DATE]

CLINICAL DATA: Initial evaluation for acute weakness, fatigue.

EXAM:
PORTABLE CHEST - 1 VIEW

[Series 1: ap portable · 0.17mm/px · 2 of 2 slices shown]
[im 1/2]
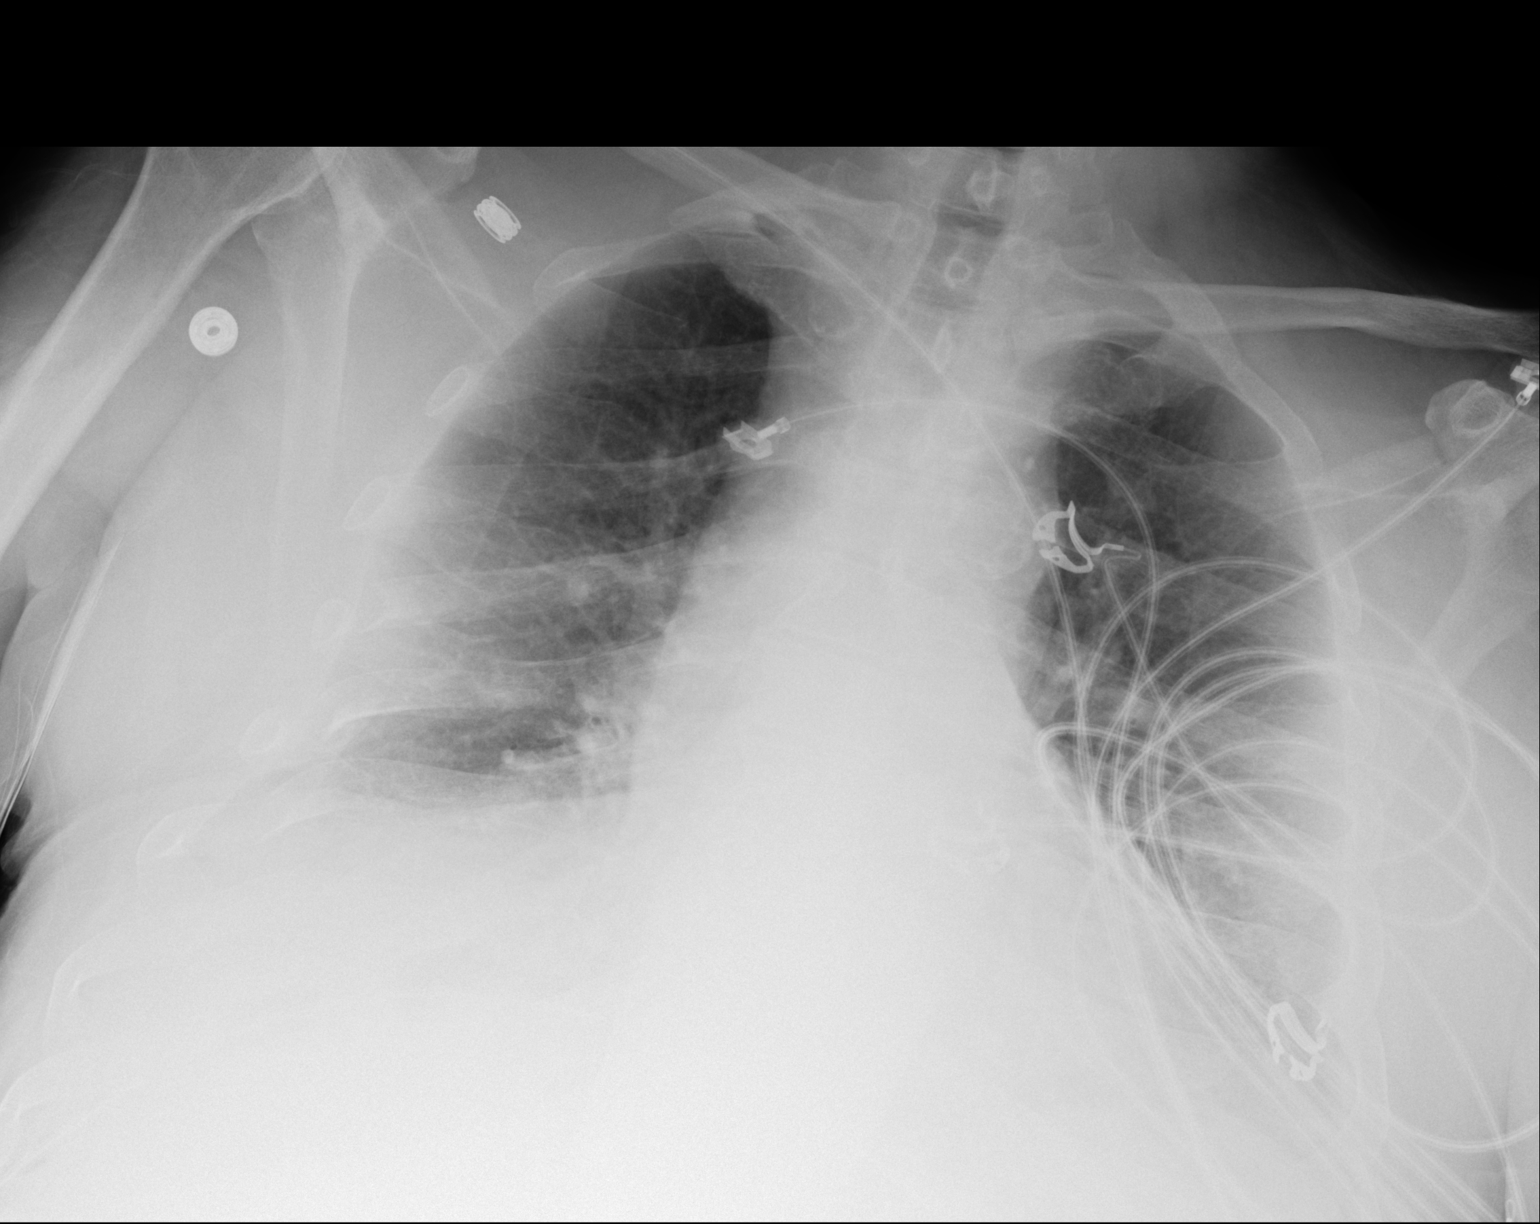
[im 2/2]
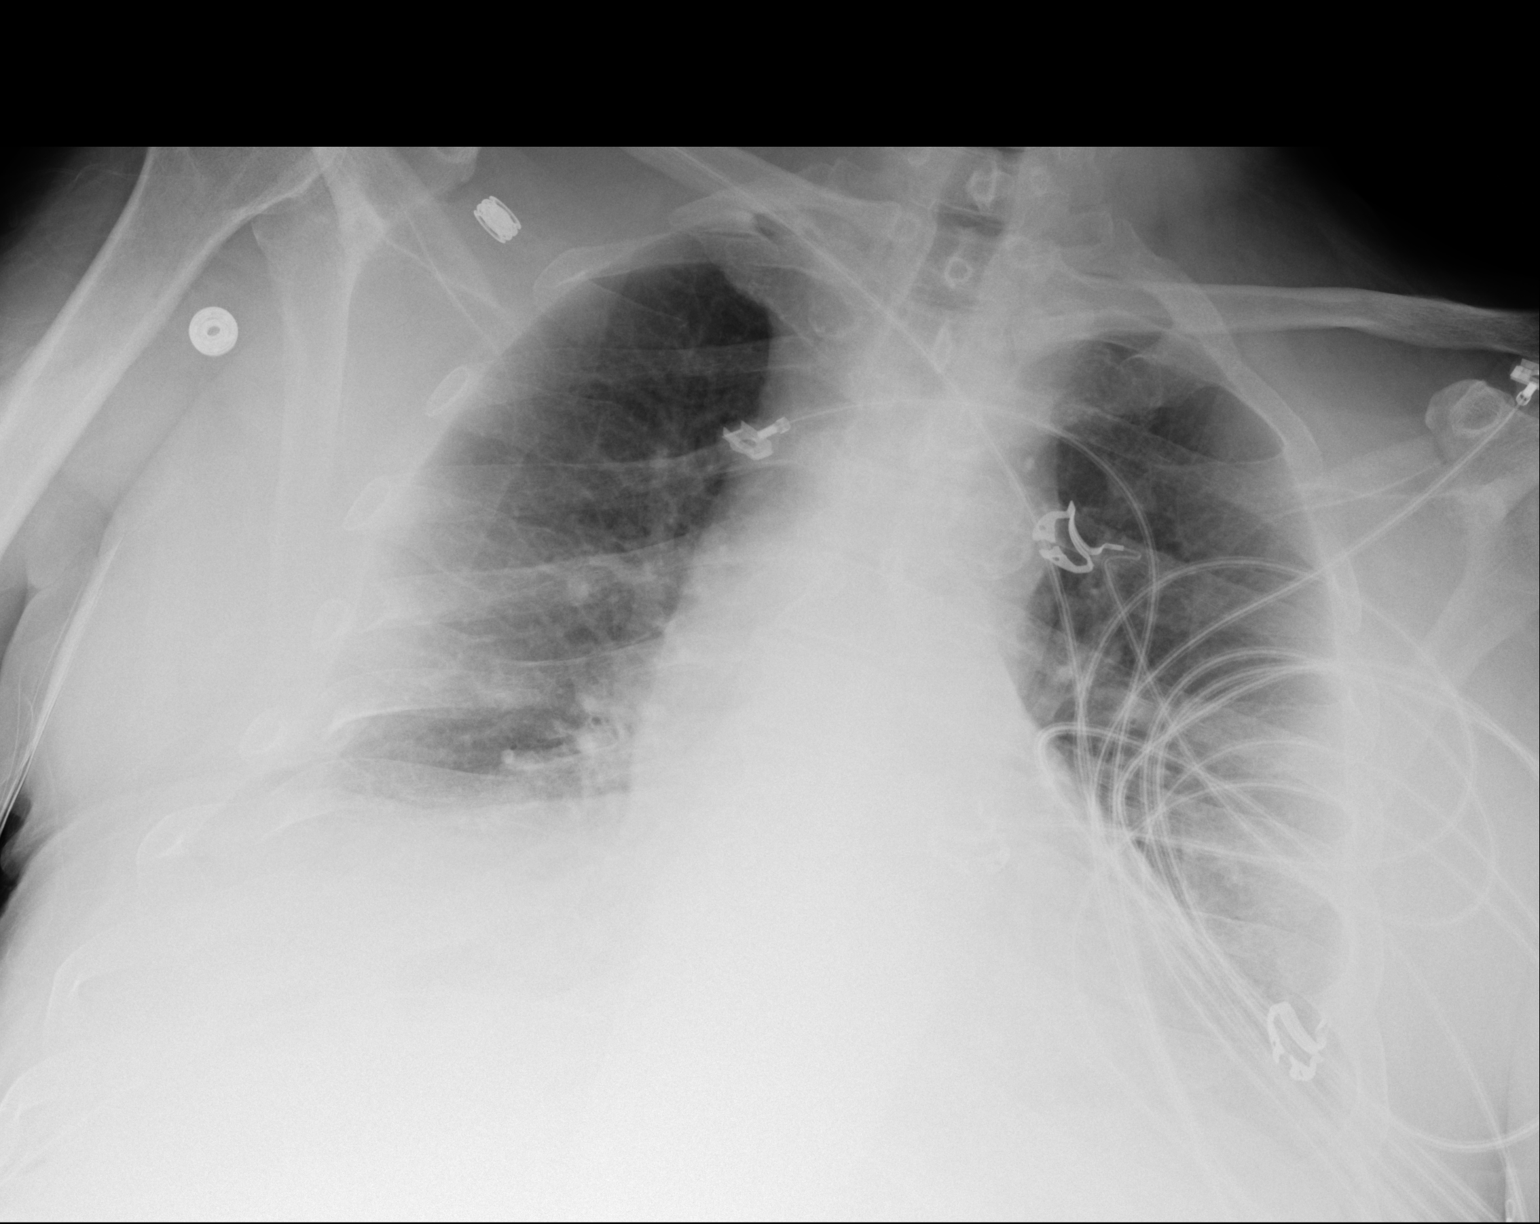

[2 of 2 positions shown; findings below may reference images not displayed]

FINDINGS: The cardiac and mediastinal silhouettes are stable in size and
contour, and remain within normal limits.

The lungs are hypoinflated. No airspace consolidation, pleural
effusion, or pulmonary edema is identified. There is no
pneumothorax.

Remote right-sided rib fractures again noted. No acute osseous
abnormality.
IMPRESSION: Shallow lung inflation with no acute cardiopulmonary abnormality
identified.

## 2014-12-14 IMAGING — US US ABDOMEN COMPLETE
1 series · 14 of 25 positions shown · non-contrast
Comparison: None.

CLINICAL DATA: Ascites for 1 week.

EXAM:
ULTRASOUND ABDOMEN COMPLETE

[Series 1: us abdomen complete · 0.21mm/px · 14 of 91 slices shown]
[im 1/91]
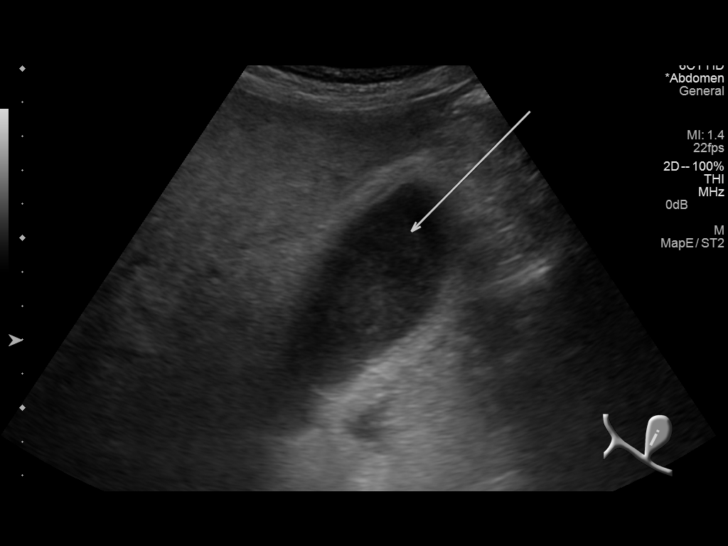
[im 8/91]
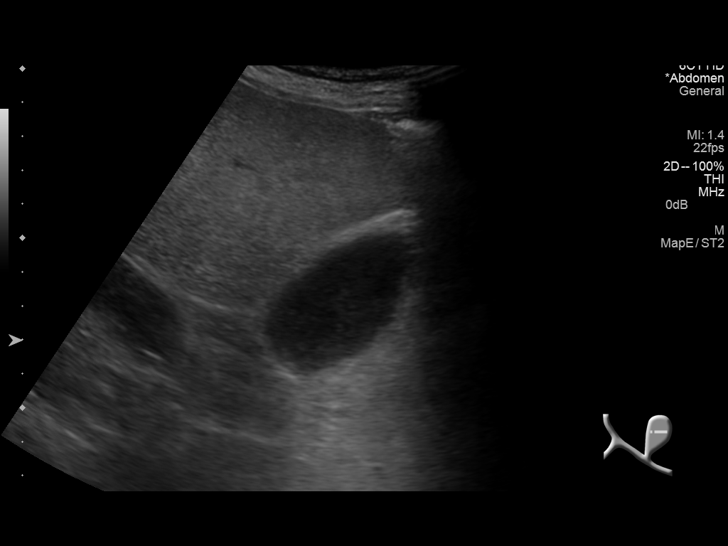
[im 16/91]
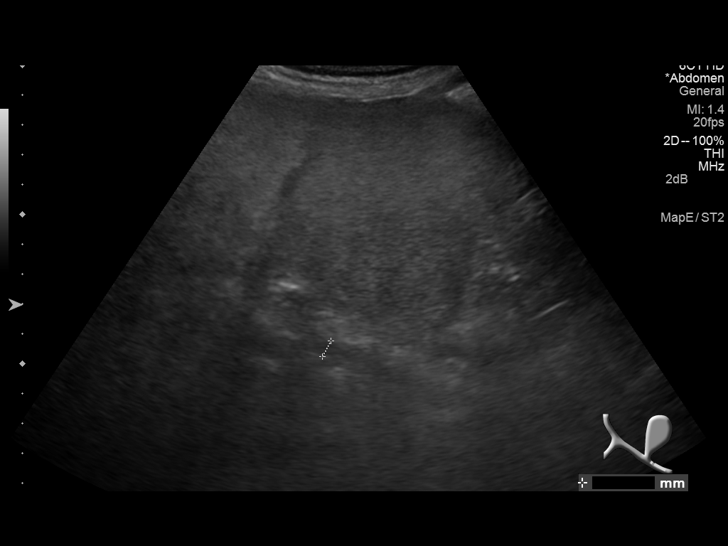
[im 23/91]
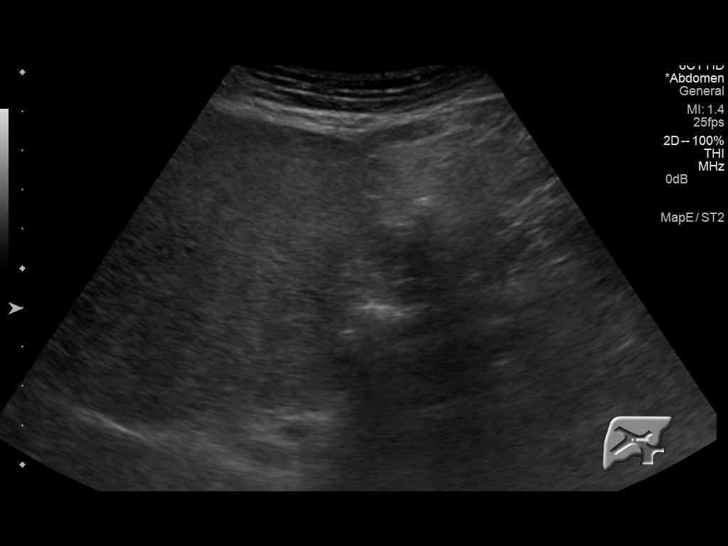
[im 31/91]
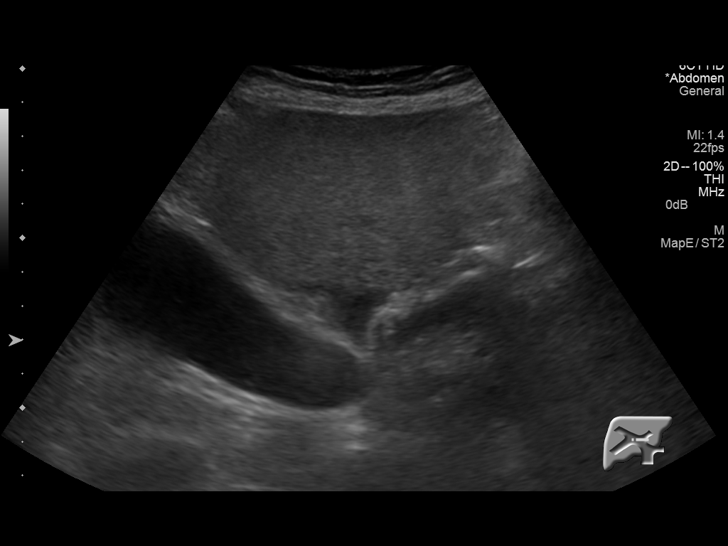
[im 34/91]
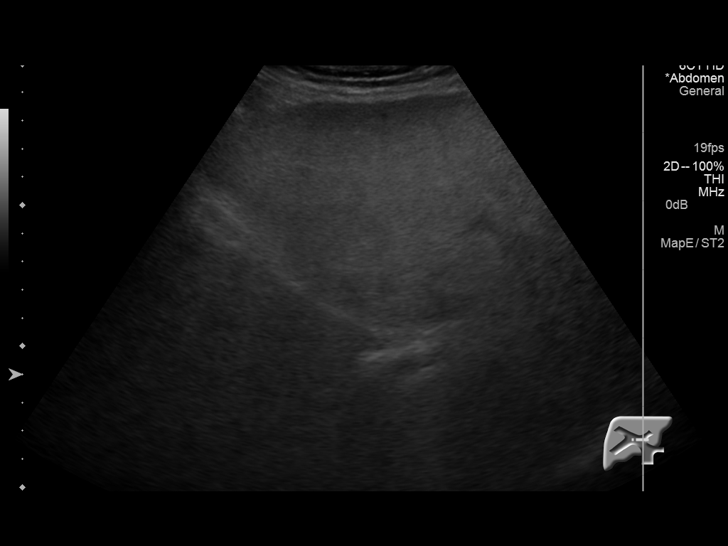
[im 42/91]
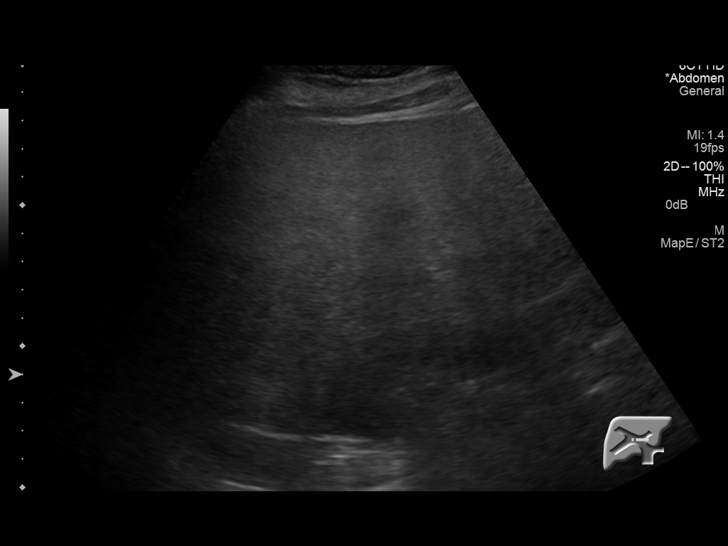
[im 49/91]
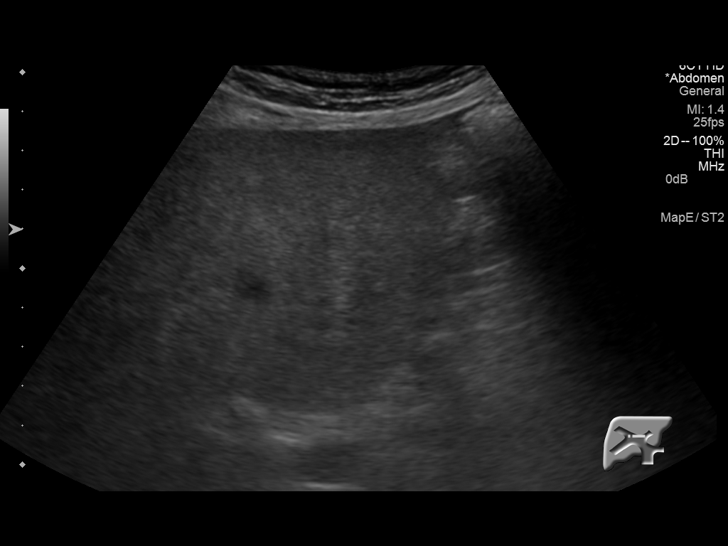
[im 57/91]
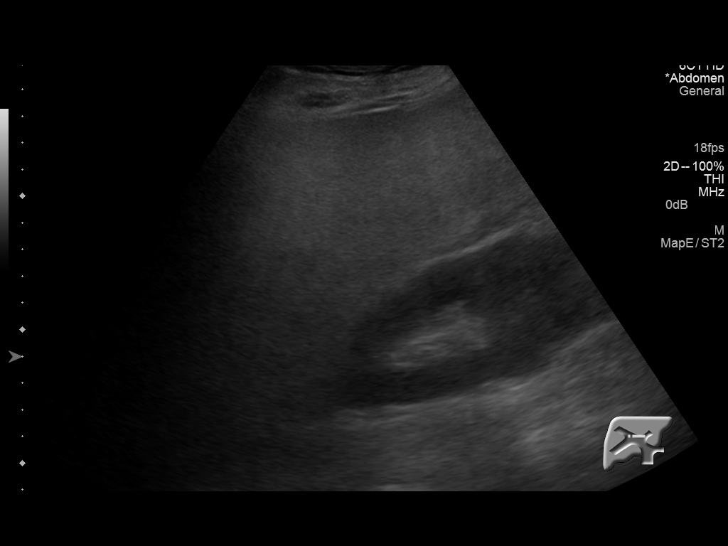
[im 61/91]
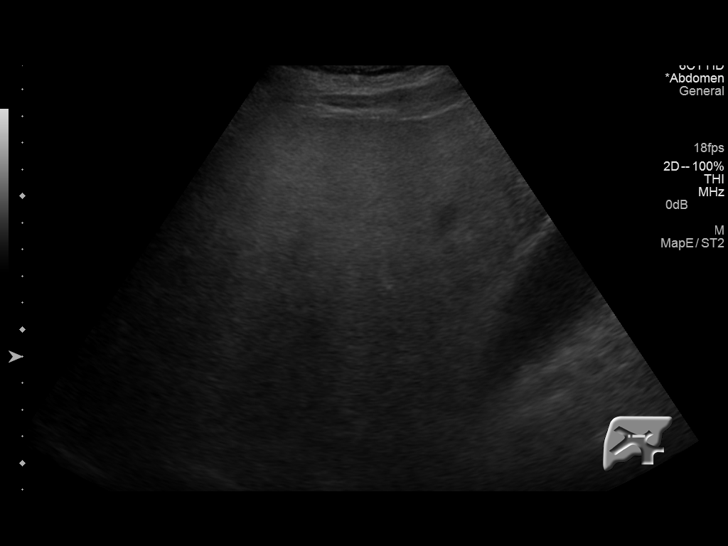
[im 68/91]
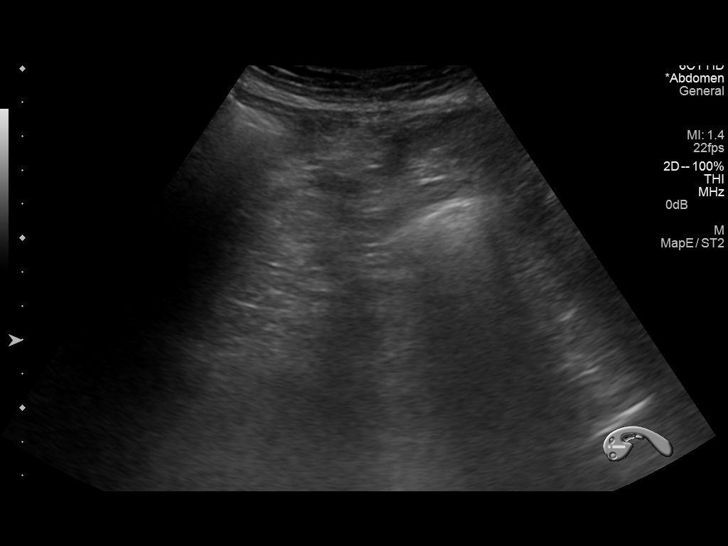
[im 76/91]
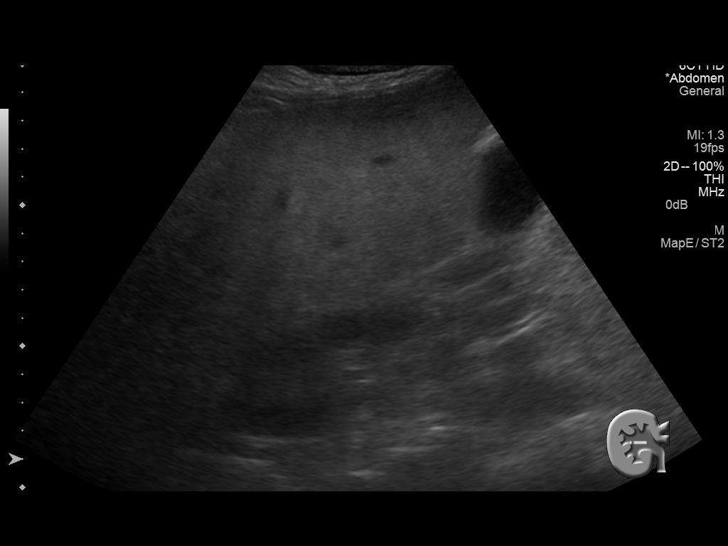
[im 83/91]
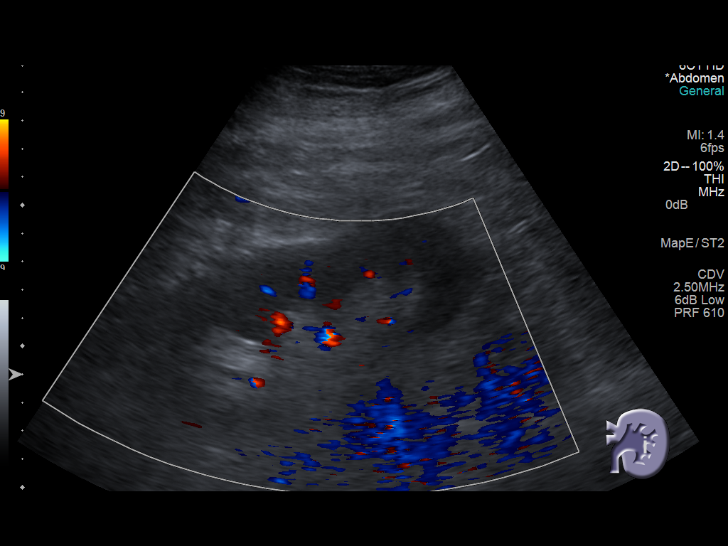
[im 91/91]
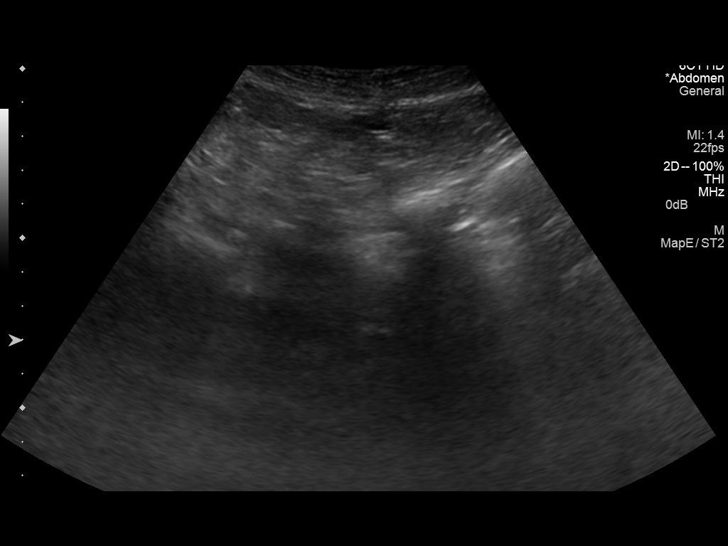

[14 of 25 positions shown; findings below may reference images not displayed]

FINDINGS: Gallbladder: The gallbladder wall appears thickened measuring
mm. Sludge is identified within the lumen of the gallbladder. No
sonographic Murphy's sign.

Common bile duct: Diameter: 4.3 mm.

Liver: The liver appears diffusely heterogeneous and echogenic with
a nodular contour.

IVC: No abnormality visualized.

Pancreas: Not visualized

Spleen: Not visualized due to overlying bowel gas.

Right Kidney: Length: 12.2 cm. Echogenicity within normal limits. No
mass or hydronephrosis visualized.

Left Kidney: Length: 13 cm.. Echogenicity within normal limits. No
mass or hydronephrosis visualized.

Abdominal aorta: Not visualized due to overlying bowel gas.

Other findings: Trace ascites noted.
IMPRESSION: 1. Morphologic features of the liver which are suggestive of
cirrhosis.
2. Gallbladder wall thickening and gallbladder sludge. In the
setting of ascites and cirrhosis the gallbladder wall thickening may
be a nonspecific finding. Correlate for any clinical signs or
symptoms of cholecystitis.

## 2014-12-14 MED ORDER — LORAZEPAM 1 MG PO TABS: 1.0000 mg | ORAL_TABLET | Freq: Four times a day (QID) | ORAL | Status: AC | PRN

## 2014-12-14 MED ORDER — POTASSIUM CHLORIDE 10 MEQ/100ML IV SOLN
10.0000 meq | Freq: Once | INTRAVENOUS | Status: AC
  Administered 2014-12-14: 10 meq via INTRAVENOUS
  Filled 2014-12-14: qty 100

## 2014-12-14 MED ORDER — MAGNESIUM OXIDE 400 (241.3 MG) MG PO TABS
400.0000 mg | ORAL_TABLET | Freq: Two times a day (BID) | ORAL | Status: AC
  Administered 2014-12-14: 400 mg via ORAL
  Filled 2014-12-14: qty 1

## 2014-12-14 MED ORDER — THIAMINE HCL 100 MG/ML IJ SOLN: 100.0000 mg | Freq: Every day | INTRAMUSCULAR | Status: DC

## 2014-12-14 MED ORDER — ALBUTEROL SULFATE (2.5 MG/3ML) 0.083% IN NEBU
5.0000 mg | INHALATION_SOLUTION | Freq: Once | RESPIRATORY_TRACT | Status: AC
  Administered 2014-12-14: 5 mg via RESPIRATORY_TRACT
  Filled 2014-12-14: qty 6

## 2014-12-14 MED ORDER — PANTOPRAZOLE SODIUM 40 MG PO TBEC
40.0000 mg | DELAYED_RELEASE_TABLET | Freq: Every day | ORAL | Status: DC
  Administered 2014-12-15 – 2014-12-17 (×3): 40 mg via ORAL
  Filled 2014-12-14 (×3): qty 1

## 2014-12-14 MED ORDER — LORAZEPAM 2 MG/ML IJ SOLN: 0.0000 mg | Freq: Two times a day (BID) | INTRAMUSCULAR | Status: DC

## 2014-12-14 MED ORDER — SODIUM CHLORIDE 0.9 % IV SOLN: 250.0000 mL | INTRAVENOUS | Status: DC | PRN

## 2014-12-14 MED ORDER — LACTULOSE 10 GM/15ML PO SOLN
10.0000 g | Freq: Two times a day (BID) | ORAL | Status: DC
  Filled 2014-12-14: qty 30

## 2014-12-14 MED ORDER — LACTULOSE 10 GM/15ML PO SOLN: 10.0000 g | Freq: Three times a day (TID) | ORAL | Status: DC

## 2014-12-14 MED ORDER — FUROSEMIDE 10 MG/ML IJ SOLN
40.0000 mg | Freq: Every day | INTRAMUSCULAR | Status: DC
  Administered 2014-12-14: 40 mg via INTRAVENOUS
  Filled 2014-12-14: qty 4

## 2014-12-14 MED ORDER — ADULT MULTIVITAMIN W/MINERALS CH
1.0000 | ORAL_TABLET | Freq: Every day | ORAL | Status: DC
Start: 2014-12-14 — End: 2014-12-17
  Administered 2014-12-14 – 2014-12-17 (×4): 1 via ORAL
  Filled 2014-12-14 (×4): qty 1

## 2014-12-14 MED ORDER — POTASSIUM CHLORIDE CRYS ER 20 MEQ PO TBCR
40.0000 meq | EXTENDED_RELEASE_TABLET | Freq: Once | ORAL | Status: AC
  Administered 2014-12-14: 40 meq via ORAL
  Filled 2014-12-14: qty 2

## 2014-12-14 MED ORDER — SODIUM CHLORIDE 0.9 % IJ SOLN
3.0000 mL | Freq: Two times a day (BID) | INTRAMUSCULAR | Status: DC
  Administered 2014-12-14 – 2014-12-17 (×6): 3 mL via INTRAVENOUS

## 2014-12-14 MED ORDER — MAGNESIUM SULFATE 4 GM/100ML IV SOLN
4.0000 g | Freq: Once | INTRAVENOUS | Status: AC
  Administered 2014-12-14: 4 g via INTRAVENOUS
  Filled 2014-12-14: qty 100

## 2014-12-14 MED ORDER — LORAZEPAM 2 MG/ML IJ SOLN: 0.0000 mg | Freq: Four times a day (QID) | INTRAMUSCULAR | Status: AC

## 2014-12-14 MED ORDER — FOLIC ACID 1 MG PO TABS
1.0000 mg | ORAL_TABLET | Freq: Every day | ORAL | Status: DC
  Administered 2014-12-14 – 2014-12-17 (×4): 1 mg via ORAL
  Filled 2014-12-14 (×4): qty 1

## 2014-12-14 MED ORDER — LORAZEPAM 1 MG PO TABS
1.0000 mg | ORAL_TABLET | Freq: Once | ORAL | Status: AC
  Administered 2014-12-14: 1 mg via ORAL
  Filled 2014-12-14: qty 1

## 2014-12-14 MED ORDER — POTASSIUM CHLORIDE 10 MEQ/100ML IV SOLN
10.0000 meq | INTRAVENOUS | Status: AC
  Administered 2014-12-14 (×4): 10 meq via INTRAVENOUS
  Filled 2014-12-14 (×3): qty 100

## 2014-12-14 MED ORDER — POTASSIUM CHLORIDE CRYS ER 20 MEQ PO TBCR
40.0000 meq | EXTENDED_RELEASE_TABLET | Freq: Two times a day (BID) | ORAL | Status: AC
  Administered 2014-12-14 (×2): 40 meq via ORAL
  Filled 2014-12-14 (×2): qty 2

## 2014-12-14 MED ORDER — LACTULOSE 10 GM/15ML PO SOLN
20.0000 g | Freq: Three times a day (TID) | ORAL | Status: DC
  Administered 2014-12-14 – 2014-12-15 (×6): 20 g via ORAL
  Filled 2014-12-14 (×5): qty 30

## 2014-12-14 MED ORDER — PANTOPRAZOLE SODIUM 40 MG IV SOLR
40.0000 mg | INTRAVENOUS | Status: DC
  Administered 2014-12-14: 40 mg via INTRAVENOUS

## 2014-12-14 MED ORDER — MAGNESIUM SULFATE 2 GM/50ML IV SOLN
2.0000 g | Freq: Once | INTRAVENOUS | Status: AC
  Administered 2014-12-14: 2 g via INTRAVENOUS
  Filled 2014-12-14: qty 50

## 2014-12-14 MED ORDER — ENSURE ENLIVE PO LIQD
237.0000 mL | Freq: Two times a day (BID) | ORAL | Status: DC
  Administered 2014-12-14 – 2014-12-17 (×5): 237 mL via ORAL

## 2014-12-14 MED ORDER — SODIUM CHLORIDE 0.9 % IJ SOLN
3.0000 mL | Freq: Two times a day (BID) | INTRAMUSCULAR | Status: DC
  Administered 2014-12-14: 3 mL via INTRAVENOUS

## 2014-12-14 MED ORDER — SODIUM CHLORIDE 0.9 % IJ SOLN: 3.0000 mL | INTRAMUSCULAR | Status: DC | PRN

## 2014-12-14 MED ORDER — RIFAXIMIN 550 MG PO TABS
550.0000 mg | ORAL_TABLET | Freq: Two times a day (BID) | ORAL | Status: DC
  Administered 2014-12-14 – 2014-12-17 (×7): 550 mg via ORAL
  Filled 2014-12-14 (×7): qty 1

## 2014-12-14 MED ORDER — LORAZEPAM 2 MG/ML IJ SOLN
1.0000 mg | Freq: Four times a day (QID) | INTRAMUSCULAR | Status: AC | PRN
Start: 2014-12-14 — End: 2014-12-17

## 2014-12-14 MED ORDER — VITAMIN B-1 100 MG PO TABS
100.0000 mg | ORAL_TABLET | Freq: Every day | ORAL | Status: DC
Start: 2014-12-14 — End: 2014-12-17
  Administered 2014-12-14 – 2014-12-17 (×4): 100 mg via ORAL
  Filled 2014-12-14 (×4): qty 1

## 2014-12-14 NOTE — Progress Notes (Signed)
Notified Dr. Robb Matarrtiz that the patient had visible blood in his stool when he had and medium to large bm.  MD stated to be sure there is an CBC in am.

## 2014-12-14 NOTE — ED Notes (Signed)
Patient states "i dont hurt, I just feel aches and weak all over"
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
## 2014-12-14 NOTE — H&P (Signed)
PCP:   Catalina PizzaHALL, ZACH, MD   Chief Complaint:  weakness  HPI: 64 yo male comes in with over a week of progressive weakness and swelling all over, also turning yellow.  Pt states he went to see dr hall 2 weeks ago, had a bunch of blood work done but has not heard back from his office, he was told by dr hall that he needed to cut back on his drinking etoh to only 4 beers a day which pt has been doing.  Has longstanding h/o etoh abuse.  Pt is not aware of any liver disease, and is not aware of anyone telling him he has needed to stop drinking except for dr hall 2 weeks ago.  He denies any pain.  No n/v/d.  He has prostate issues, so i/o caths himself at home chronically.  Pt is jaundiced, with decompensated liver failure at this time although his vitals are normal has low potassium level.    Review of Systems:  Positive and negative as per HPI otherwise all other systems are negative  Past Medical History: Past Medical History  Diagnosis Date  . CAD (coronary artery disease), native coronary artery     Multivessel at cardiac catheterization 1999 - managed medically by Dr. Amil AmenEdmunds  . Essential hypertension   . Alcoholism   . Allergic rhinitis   . Prostatic hypertrophy   . Glucose intolerance (pre-diabetes)   . Rib fractures   . History of SIADH   . History of UTI   . Bronchitis    Past Surgical History  Procedure Laterality Date  . Splenectomy    . Vasectomy    . Knee arthroplasty Right     Medications: Prior to Admission medications   Medication Sig Start Date End Date Taking? Authorizing Provider  aspirin 81 MG tablet Take 81 mg by mouth daily.   Yes Historical Provider, MD  B Complex-C (SUPER B COMPLEX PO) Take 1 tablet by mouth daily.   Yes Historical Provider, MD  chlorpheniramine (EQ CHLORTABS) 4 MG tablet Take 4 mg by mouth every 4 (four) hours as needed for allergies.   Yes Historical Provider, MD  docusate sodium 100 MG CAPS Take 100 mg by mouth 2 (two) times daily. For  constipation 04/26/14  Yes Ripudeep K Rai, MD  fluticasone (FLONASE) 50 MCG/ACT nasal spray Place 1 spray into both nostrils daily. 04/26/14  Yes Ripudeep Jenna LuoK Rai, MD  metoprolol tartrate (LOPRESSOR) 50 MG tablet Take 1 tablet (50 mg total) by mouth 2 (two) times daily. 08/14/14  Yes Jonelle SidleSamuel G McDowell, MD  Omega-3 Fatty Acids (FISH OIL) 1000 MG CAPS Take by mouth. 2-3 tabs per day   Yes Historical Provider, MD  albuterol (PROVENTIL HFA;VENTOLIN HFA) 108 (90 BASE) MCG/ACT inhaler Inhale 2 puffs into the lungs every 6 (six) hours as needed for wheezing or shortness of breath. 04/26/14   Ripudeep Jenna LuoK Rai, MD  nitroGLYCERIN (NITROSTAT) 0.4 MG SL tablet Place 1 tablet (0.4 mg total) under the tongue every 5 (five) minutes as needed for chest pain. 06/05/14   Jonelle SidleSamuel G McDowell, MD  sodium chloride 1 G tablet Take 1 tablet (1 g total) by mouth 2 (two) times daily with a meal. 04/26/14   Ripudeep Jenna LuoK Rai, MD    Allergies:   Allergies  Allergen Reactions  . Amlodipine Besylate     REACTION: Feet swell  . Penicillins Other (See Comments)    Childhood allergy    Social History:  reports that he quit smoking  about 26 years ago. His smoking use included Cigarettes. He started smoking about 48 years ago. He has a 21 pack-year smoking history. He has never used smokeless tobacco. He reports that he drinks about 25.2 oz of alcohol per week. He reports that he does not use illicit drugs.  Family History: Family History  Problem Relation Age of Onset  . Heart disease Father     Physical Exam: Filed Vitals:   12/14/14 0100 12/14/14 0136 12/14/14 0218 12/14/14 0236  BP: 127/78  116/67 122/84  Pulse: 111  116 113  Temp:      TempSrc:      Resp: 20   22  Height:      Weight:      SpO2: 100% 96%  98%   General appearance: alert, cooperative and no distress Head: Normocephalic, without obvious abnormality, atraumatic Eyes: negative Nose: Nares normal. Septum midline. Mucosa normal. No drainage or sinus  tenderness. Neck: no JVD and supple, symmetrical, trachea midline Lungs: clear to auscultation bilaterally Heart: regular rate and rhythm, S1, S2 normal, no murmur, click, rub or gallop Abdomen: soft, non-tender; bowel sounds normal; no masses,  no organomegaly mild ascites present Extremities: edema 3+  , anasarca Pulses: 2+ and symmetric Skin: jaundice noted Neurologic: Grossly normal    Labs on Admission:   Recent Labs  12/14/14 0125  NA 127*  K 2.6*  CL 86*  CO2 27  GLUCOSE 112*  BUN 16  CREATININE 1.10  CALCIUM 7.6*    Recent Labs  12/14/14 0125  AST 109*  ALT 26  ALKPHOS 216*  BILITOT 13.1*  PROT 6.5  ALBUMIN 1.8*    Recent Labs  12/14/14 0125  LIPASE 23    Recent Labs  12/14/14 0125  WBC 18.7*  NEUTROABS 13.3*  HGB 12.0*  HCT 33.6*  MCV 96.6  PLT 278    Recent Labs  12/14/14 0125  TROPONINI <0.03   Radiological Exams on Admission: Dg Chest Portable 1 View  12/14/2014   CLINICAL DATA:  Initial evaluation for acute weakness, fatigue.  EXAM: PORTABLE CHEST - 1 VIEW  COMPARISON:  Prior radiograph from 04/25/2014  FINDINGS: The cardiac and mediastinal silhouettes are stable in size and contour, and remain within normal limits.  The lungs are hypoinflated. No airspace consolidation, pleural effusion, or pulmonary edema is identified. There is no pneumothorax.  Remote right-sided rib fractures again noted. No acute osseous abnormality.  IMPRESSION: Shallow lung inflation with no acute cardiopulmonary abnormality identified.   Electronically Signed   By: Rise Mu M.D.   On: 12/14/2014 02:13    Assessment/Plan  64 yo male with new onset cirrhosis of the liver, h/o etoh abuse, electrolyte abnormalities  Principal Problem:   Hypokalemia--  Replete iv, ck mag level.  Active Problems:  Stable unless o/w noted   Cirrhosis of liver- new onset likely due to etoh abuse.  Ck abd [place].  Gi consult.  Start on lactulose, give lasix  daily iv.      Alcohol abuse- pt has been strongly advised he needs to quit totally.  Saw dr hall 2 weeks ago who advised him to cut back to only 4 beers a day which he has been doing.   Essential hypertension   CAD (coronary artery disease), native coronary artery   Weakness generalized   Jaundice  Admit to tele.  Full code.  Nabil Bubolz A 12/14/2014, 2:52 AM

## 2014-12-14 NOTE — ED Notes (Signed)
Patient VIA [facility] for weakness. Bother states patient began to get week last weekend. Tonight patient fell at 2300. Patient presents with bilateral +4 pitting edema to bilateral extremities that extends to thighs. Patient has audible upper airway wheezing. Patient also has jaundice color to skin and eyes. Patient is alert & oriented but lethargic.

## 2014-12-14 NOTE — Progress Notes (Addendum)
[facility] PROGRESS NOTE Interim History: 64 yo male comes in with over a week of progressive weakness and swelling all over, also turning yellow. Pt states he went to see dr hall 2 weeks ago, had a bunch of blood work done but has not heard back from his office, he was told by dr hall that he needed to cut back on his drinking, he relates he has cut back on his drinking is only been drinking 4 beers a day.   Assessment/Plan: Acute hepatic liver failure Jaundice (billirubinemia) and Cirrhosis of liver/Alcohol abuse: - AST and ALT pattern is split more than 2:1, PT is 21.6, bilirubin is 13 and albumin is 1.8, his meld score is 23 - He relates he consumes large quantities of alcohol. - Has mild asterixis on physical exam start him on lactulose and rifaxamin. - he relates that about 2 weeks ago he was taking tylenol around the clock for joint pain.   Hypovolemia Hyponatremia: - Is likely due to liver disease and hypovelemia, his UA shows specific gravity of 1010. - We'll get a urinary sodium urinary creatinine. He has mild lower extremity edema and his albumin is 1.8. His albumin will probably decrease after hydration. - I think he is intravascularly depleted and the cause of his hyponatremia is hypovolemia.  Hypokalemia/Hypomagnasemia: - Magnesium is low replete IV continue repeat potassium orally.  Essential hypertension: - Blood pressure borderline low to low D/C metoprolol.  Severe protein caloric malnutrition: - Nutrition consult  Leukocytosis: - With mild left shift, he has remained afebrile. - no diarhea, started on lactulose. - ? Due to liver failure/  Code Status: full Family Communication: brother  Disposition Plan: 2-3 days   Consultants:  GI  Procedures:  [place] abd: cirrhosis no ascities  Antibiotics:  None  HPI/Subjective: He is complaining that he is really weak.  Objective: Filed Vitals:   12/14/14 0136 12/14/14 0218 12/14/14 0236 12/14/14  0350  BP:  116/67 122/84 95/60  Pulse:  116 113 110  Temp:    98.2 F (36.8 C)  TempSrc:    Oral  Resp:   22 18  Height:     (1.651 m)  Weight:    88.7 kg (195 lb 8.8 oz)  SpO2: 96%  98% 100%    Intake/Output Summary (Last 24 hours) at 12/14/14 1004 Last data filed at 12/14/14 0800  Gross per 24 hour  Intake      0 ml  Output   1750 ml  Net  -1750 ml   Filed Weights   12/14/14 0057 12/14/14 0350  Weight: 83.915 kg (185 lb) 88.7 kg (195 lb 8.8 oz)    Exam:  General: Alert, awake, oriented x3, in no acute distress.  HEENT: No bruits, no goiter. Jaundice Heart: Regular rate and rhythm. Lungs: Good air movement, clear no rales. Abdomen: Soft, nontender, distended no fluid wave appreciated. positive bowel sounds.  Neuro: Grossly intact, non focal positive for asterixis.   Data Reviewed: Basic Metabolic Panel:  Recent Labs Lab 12/14/14 0125 12/14/14 0349  NA 127* 128*  K 2.6* 2.8*  CL 86* 88*  CO2 27 25  GLUCOSE 112* 112*  BUN 16 16  CREATININE 1.10 1.00  CALCIUM 7.6* 7.5*  MG  --  1.2*   Liver Function Tests:  Recent Labs Lab 12/14/14 0125  AST 109*  ALT 26  ALKPHOS 216*  BILITOT 13.1*  PROT 6.5  ALBUMIN 1.8*    Recent Labs Lab 12/14/14 0125  LIPASE 23    Recent Labs Lab 12/14/14 0349  AMMONIA 93*   CBC:  Recent Labs Lab 12/14/14 0125  WBC 18.7*  NEUTROABS 13.3*  HGB 12.0*  HCT 33.6*  MCV 96.6  PLT 278   Cardiac Enzymes:  Recent Labs Lab 12/14/14 0125  TROPONINI <0.03   BNP (last 3 results)  Recent Labs  12/14/14 0125  BNP 250.0*    ProBNP (last 3 results)  Recent Labs  04/25/14 0620  PROBNP 324.5*    CBG: No results for input(s): GLUCAP in the last 168 hours.  No results found for this or any previous visit (from the past 240 hour(s)).   Studies: [place] Abdomen Complete  12/14/2014   CLINICAL DATA:  Ascites for 1 week.  EXAM: ULTRASOUND ABDOMEN COMPLETE  COMPARISON:  None.  FINDINGS: Gallbladder: The  gallbladder wall appears thickened measuring 9.8 mm. Sludge is identified within the lumen of the gallbladder. No sonographic Murphy's sign.  Common bile duct: Diameter: 4.3 mm.  Liver: The liver appears diffusely heterogeneous and echogenic with a nodular contour.  IVC: No abnormality visualized.  Pancreas: Not visualized  Spleen: Not visualized due to overlying bowel gas.  Right Kidney: Length: 12.2 cm. Echogenicity within normal limits. No mass or hydronephrosis visualized.  Left Kidney: Length: 13 cm. Echogenicity within normal limits. No mass or hydronephrosis visualized.  Abdominal aorta: Not visualized due to overlying bowel gas.  Other findings: Trace ascites noted.  IMPRESSION: 1. Morphologic features of the liver which are suggestive of cirrhosis. 2. Gallbladder wall thickening and gallbladder sludge. In the setting of ascites and cirrhosis the gallbladder wall thickening may be a nonspecific finding. Correlate for any clinical signs or symptoms of cholecystitis.   Electronically Signed   By: Signa Kellaylor  Stroud M.D.   On: 12/14/2014 09:05   Dg Chest Portable 1 View  12/14/2014   CLINICAL DATA:  Initial evaluation for acute weakness, fatigue.  EXAM: PORTABLE CHEST - 1 VIEW  COMPARISON:  Prior radiograph from 04/25/2014  FINDINGS: The cardiac and mediastinal silhouettes are stable in size and contour, and remain within normal limits.  The lungs are hypoinflated. No airspace consolidation, pleural effusion, or pulmonary edema is identified. There is no pneumothorax.  Remote right-sided rib fractures again noted. No acute osseous abnormality.  IMPRESSION: Shallow lung inflation with no acute cardiopulmonary abnormality identified.   Electronically Signed   By: Rise MuBenjamin  McClintock M.D.   On: 12/14/2014 02:13    Scheduled Meds: . folic acid  1 mg Oral Daily  . furosemide  40 mg Intravenous Daily  . lactulose  10 g Oral BID  . LORazepam  0-4 mg Intravenous Q6H   Followed by  . [START ON 12/16/2014]  LORazepam  0-4 mg Intravenous Q12H  . multivitamin with minerals  1 tablet Oral Daily  . pantoprazole (PROTONIX) IV  40 mg Intravenous Q24H  . sodium chloride  3 mL Intravenous Q12H  . sodium chloride  3 mL Intravenous Q12H  . thiamine  100 mg Oral Daily   Or  . thiamine  100 mg Intravenous Daily   Continuous Infusions:   Time Spent: 35 min   FELIZ Rosine BeatTIZ, ABRAHAM  [facility] Pager [phone]. If 7PM-7AM, please contact night-coverage at [email or web address], password [email or web address] 12/14/2014, 10:04 AM  LOS: 0 days

## 2014-12-14 NOTE — Care Management Note (Addendum)
    Page 1 of 1   12/17/2014     1:00:03 PM CARE MANAGEMENT NOTE 12/17/2014  Patient:  Billy Stone,Billy Stone   Account Number:  [id number]  Date Initiated:  12/14/2014  Documentation initiated by:  Kathyrn SheriffHILDRESS,JESSICA  Subjective/Objective Assessment:   Pt is from home, lives with sister. Pt has a walker and neb machine. Pt has no wheelchair, oxygen or HH services. Pt plans to discharge home with self care. Will need PT eval prior to discharge. Will cont to follow.     Action/Plan:   Anticipated DC Date:  12/14/2014   Anticipated DC Plan:  HOME W HOME HEALTH SERVICES      DC Planning Services  CM consult      Choice offered to / List presented to:             Status of service:  In process, will continue to follow Medicare Important Message given?  YES (If response is "NO", the following Medicare IM given date fields will be blank) Date Medicare IM given:  12/17/2014 Medicare IM given by:  Kathyrn SheriffHILDRESS,JESSICA Date Additional Medicare IM given:   Additional Medicare IM given by:    Discharge Disposition:  SKILLED NURSING FACILITY  Per UR Regulation:  Reviewed for med. necessity/level of care/duration of stay  If discussed at Long Length of Stay Meetings, dates discussed:    Comments:  12/14/2014 1245 Kathyrn SheriffJessica Childress, RN, MSN, CM 4/18/206 1400 Kathyrn SheriffJessica Childress, RN, MSN, CM

## 2014-12-14 NOTE — ED Provider Notes (Signed)
CSN: [id number]     Arrival date & time 12/14/14  0045 History   First MD Initiated Contact with Patient 12/14/14 0055     Chief Complaint  Patient presents with  . Fatigue    Patient is a 64 y.o. male presenting with weakness. The history is provided by the patient and a relative.  Weakness This is a new problem. The current episode started more than 1 week ago. The problem occurs daily. The problem has been gradually worsening. Associated symptoms include shortness of breath. Pertinent negatives include no chest pain, no abdominal pain and no headaches. The symptoms are aggravated by walking. The symptoms are relieved by rest.  Patient reports feeling weak/fatigued for "weeks" He reports he feels weak and dyspneic with ambulation He has fallen recently, and he fell tonight prior to arrival No syncope.  No head injury and no other injuries reported He denies HA/CP/back pain/abd pain   Patient reports he has seen his PCP for this fatigue, over a week ago.  Reports labs showed "low sodium and potassium" but no other details known.    He feels his symptoms have worsened over past several days since he saw his PCP  He admits to daily ETOH use but is "cutting back" Last use was earlier in the day - he had 4 drinks   Pt is jaundiced and brother reports he just noticed it tonight.  Past Medical History  Diagnosis Date  . CAD (coronary artery disease), native coronary artery     Multivessel at cardiac catheterization 1999 - managed medically by Dr. Amil Amen  . Essential hypertension   . Alcoholism   . Allergic rhinitis   . Prostatic hypertrophy   . Glucose intolerance (pre-diabetes)   . Rib fractures   . History of SIADH   . History of UTI   . Bronchitis    Past Surgical History  Procedure Laterality Date  . Splenectomy    . Vasectomy    . Knee arthroplasty Right    Family History  Problem Relation Age of Onset  . Heart disease Father    History  Substance Use Topics  .  Smoking status: Former Smoker -- 1.00 packs/day for 21 years    Types: Cigarettes    Start date: 11/18/1966    Quit date: 07/20/1988  . Smokeless tobacco: Never Used  . Alcohol Use: 25.2 oz/week    42 Cans of beer per week     Comment: Alcohol abuse    Review of Systems  Constitutional: Positive for fatigue. Negative for fever.  Respiratory: Positive for shortness of breath.   Cardiovascular: Positive for leg swelling. Negative for chest pain.  Gastrointestinal: Negative for abdominal pain and blood in stool.  Musculoskeletal: Negative for back pain.  Neurological: Positive for weakness. Negative for headaches.  All other systems reviewed and are negative.     Allergies  Amlodipine besylate and Penicillins  Home Medications   Prior to Admission medications   Medication Sig Start Date End Date Taking? Authorizing Provider  aspirin 81 MG tablet Take 81 mg by mouth daily.   Yes Historical Provider, MD  B Complex-C (SUPER B COMPLEX PO) Take 1 tablet by mouth daily.   Yes Historical Provider, MD  chlorpheniramine (EQ CHLORTABS) 4 MG tablet Take 4 mg by mouth every 4 (four) hours as needed for allergies.   Yes Historical Provider, MD  docusate sodium 100 MG CAPS Take 100 mg by mouth 2 (two) times daily. For constipation 04/26/14  Yes  Ripudeep Jenna LuoK Rai, MD  fluticasone (FLONASE) 50 MCG/ACT nasal spray Place 1 spray into both nostrils daily. 04/26/14  Yes Ripudeep Jenna LuoK Rai, MD  metoprolol tartrate (LOPRESSOR) 50 MG tablet Take 1 tablet (50 mg total) by mouth 2 (two) times daily. 08/14/14  Yes Jonelle SidleSamuel G McDowell, MD  Omega-3 Fatty Acids (FISH OIL) 1000 MG CAPS Take by mouth. 2-3 tabs per day   Yes Historical Provider, MD  albuterol (PROVENTIL HFA;VENTOLIN HFA) 108 (90 BASE) MCG/ACT inhaler Inhale 2 puffs into the lungs every 6 (six) hours as needed for wheezing or shortness of breath. 04/26/14   Ripudeep Jenna LuoK Rai, MD  nitroGLYCERIN (NITROSTAT) 0.4 MG SL tablet Place 1 tablet (0.4 mg total) under the  tongue every 5 (five) minutes as needed for chest pain. 06/05/14   Jonelle SidleSamuel G McDowell, MD  sodium chloride 1 G tablet Take 1 tablet (1 g total) by mouth 2 (two) times daily with a meal. 04/26/14   Ripudeep K Rai, MD   BP 126/75 mmHg  Pulse 110  Temp(Src) 97.8 F (36.6 C) (Oral)  Resp 24  Ht 5\' 5"  (1.651 m)  Wt 185 lb (83.915 kg)  BMI 30.79 kg/m2  SpO2 98% Physical Exam CONSTITUTIONAL: elderly, frail HEAD: Normocephalic/atraumatic EYES: EOMI/PERRL, scleral icterus noted ENMT: Mucous membranes dry NECK: supple no meningeal signs SPINE/BACK:entire spine nontender, No bruising/crepitance/stepoffs noted to spine CV: S1/S2 noted, no murmurs/rubs/gallops noted LUNGS: mild tachypnea noted.  Scattered wheezing bilaterally ABDOMEN: soft, nontender, no rebound or guarding, bowel sounds noted throughout abdomen Rectal - stool color brown.  Nurse justin present for exam NEURO: Pt is awake/alert/appropriate, moves all extremitiesx4.  No facial droop.  No arm/leg drift.   EXTREMITIES: pulses normal/equal, full ROM, pitting edema noted to bilateral LE.  No signs of trauma or deformity SKIN: jaundice noted PSYCH: no abnormalities of mood noted, alert and oriented to situation  ED Course  Procedures  1:24 AM Pt with increasing fatigue/dyspnea on exertion and now jaundice Pt is ill appearing Labs/imaging pending Pt consented to brother in room during exam 3:02 AM Labs resulted Pt will need admission for dehydration and correction of metabolic abnormalities as well as due to recent increased falls (no signs of trauma, and no signs of stroke) He is high risk for ETOH withdrawal given h/o ETOH abuse D/w dr Onalee Huaavid, will admit   Labs Review Labs Reviewed  CBC WITH DIFFERENTIAL/PLATELET - Abnormal; Notable for the following:    WBC 18.7 (*)    RBC 3.48 (*)    Hemoglobin 12.0 (*)    HCT 33.6 (*)    MCH 34.5 (*)    RDW 18.6 (*)    Neutro Abs 13.3 (*)    Monocytes Absolute 1.7 (*)    All other  components within normal limits  COMPREHENSIVE METABOLIC PANEL - Abnormal; Notable for the following:    Sodium 127 (*)    Potassium 2.6 (*)    Chloride 86 (*)    Glucose, Bld 112 (*)    Calcium 7.6 (*)    Albumin 1.8 (*)    AST 109 (*)    Alkaline Phosphatase 216 (*)    Total Bilirubin 13.1 (*)    GFR calc non Af Amer 68 (*)    GFR calc Af Amer 79 (*)    All other components within normal limits  ACETAMINOPHEN LEVEL - Abnormal; Notable for the following:    Acetaminophen (Tylenol), Serum <10.0 (*)    All other components within normal limits  PROTIME-INR -  Abnormal; Notable for the following:    Prothrombin Time 21.6 (*)    INR 1.86 (*)    All other components within normal limits  URINALYSIS, ROUTINE W REFLEX MICROSCOPIC - Abnormal; Notable for the following:    Color, Urine AMBER (*)    Glucose, UA 100 (*)    Hgb urine dipstick LARGE (*)    Bilirubin Urine LARGE (*)    Ketones, ur TRACE (*)    Protein, ur TRACE (*)    Urobilinogen, UA 4.0 (*)    Leukocytes, UA TRACE (*)    All other components within normal limits  BRAIN NATRIURETIC PEPTIDE - Abnormal; Notable for the following:    B Natriuretic Peptide 250.0 (*)    All other components within normal limits  URINE MICROSCOPIC-ADD ON - Abnormal; Notable for the following:    Bacteria, UA MANY (*)    All other components within normal limits  LIPASE, BLOOD  TROPONIN I  ETHANOL  MAGNESIUM  AMMONIA  POC OCCULT BLOOD, ED    Imaging Review Dg Chest Portable 1 View  12/14/2014   CLINICAL DATA:  Initial evaluation for acute weakness, fatigue.  EXAM: PORTABLE CHEST - 1 VIEW  COMPARISON:  Prior radiograph from 04/25/2014  FINDINGS: The cardiac and mediastinal silhouettes are stable in size and contour, and remain within normal limits.  The lungs are hypoinflated. No airspace consolidation, pleural effusion, or pulmonary edema is identified. There is no pneumothorax.  Remote right-sided rib fractures again noted. No acute  osseous abnormality.  IMPRESSION: Shallow lung inflation with no acute cardiopulmonary abnormality identified.   Electronically Signed   By: Rise Mu M.D.   On: 12/14/2014 02:13     EKG Interpretation   Date/Time:  Monday December 14 2014 00:54:09 EDT Ventricular Rate:  108 PR Interval:  160 QRS Duration: 78 QT Interval:  356 QTC Calculation: 477 R Axis:   13 Text Interpretation:  Sinus tachycardia with Premature supraventricular  complexes and with occasional Premature ventricular complexes ST \\T \ T  wave abnormality, consider anterior ischemia Abnormal ECG artifact noted  changes noted from prior Confirmed by Bebe Shaggy  MD, Woodie Trusty ([id number]) on  12/14/2014 1:18:37 AM     Medications  potassium chloride 10 mEq in 100 mL IVPB (10 mEq Intravenous New Bag/Given 12/14/14 0248)  albuterol (PROVENTIL) (2.5 MG/3ML) 0.083% nebulizer solution 5 mg (5 mg Nebulization Given 12/14/14 0133)  LORazepam (ATIVAN) tablet 1 mg (1 mg Oral Given 12/14/14 0235)  potassium chloride SA (K-DUR,KLOR-CON) CR tablet 40 mEq (40 mEq Oral Given 12/14/14 0248)    MDM   Final diagnoses:  Hypokalemia  Dehydration  Hepatitis  Alcohol abuse  Hyperbilirubinemia  Hyponatremia    Nursing notes including past medical history and social history reviewed and considered in documentation xrays/imaging reviewed by myself and considered during evaluation Labs/vital reviewed myself and considered during evaluation     Zadie Rhine, MD 12/14/14 1914

## 2014-12-14 NOTE — ED Notes (Signed)
CRITICAL VALUE ALERT  Critical value received: K+ 2.6  Date of notification:  12/14/2014  Time of notification:  0215  Critical value read back:Yes.    Nurse who received alert:  Georg RuddleJustin RN  MD notified (1st page):  [place]  Time of first page:  0215  MD notified (2nd page):  Time of second page:  Responding MD:  Bebe ShaggyWickline  Time MD responded:  [phone]

## 2014-12-14 NOTE — Care Management Utilization Note (Signed)
UR completed 

## 2014-12-15 LAB — MAGNESIUM: Magnesium: 2.1 mg/dL (ref 1.5–2.5)

## 2014-12-15 LAB — COMPREHENSIVE METABOLIC PANEL
ALK PHOS: 198 U/L — AB (ref 39–117)
ALT: 23 U/L (ref 0–53)
AST: 110 U/L — ABNORMAL HIGH (ref 0–37)
Albumin: 1.4 g/dL — ABNORMAL LOW (ref 3.5–5.2)
Anion gap: 10 (ref 5–15)
BILIRUBIN TOTAL: 10.3 mg/dL — AB (ref 0.3–1.2)
BUN: 12 mg/dL (ref 6–23)
CO2: 29 mmol/L (ref 19–32)
Calcium: 7.7 mg/dL — ABNORMAL LOW (ref 8.4–10.5)
Chloride: 92 mmol/L — ABNORMAL LOW (ref 96–112)
Creatinine, Ser: 0.66 mg/dL (ref 0.50–1.35)
GFR calc non Af Amer: >90 mL/min (ref >90)
GLUCOSE: 110 mg/dL — AB (ref 70–99)
POTASSIUM: 3.3 mmol/L — AB (ref 3.5–5.1)
SODIUM: 131 mmol/L — AB (ref 135–145)
Total Protein: 5.6 g/dL — ABNORMAL LOW (ref 6.0–8.3)

## 2014-12-15 LAB — CBC
HCT: 29.9 % — ABNORMAL LOW (ref 39.0–52.0)
Hemoglobin: 10.6 g/dL — ABNORMAL LOW (ref 13.0–17.0)
MCH: 34.5 pg — ABNORMAL HIGH (ref 26.0–34.0)
MCHC: 35.5 g/dL (ref 30.0–36.0)
MCV: 97.4 fL (ref 78.0–100.0)
Platelets: 285 10*3/uL (ref 150–400)
RBC: 3.07 MIL/uL — AB (ref 4.22–5.81)
RDW: 19 % — AB (ref 11.5–15.5)
WBC: 17.6 10*3/uL — AB (ref 4.0–10.5)

## 2014-12-15 LAB — PROTIME-INR
INR: 1.76 — AB (ref 0.00–1.49)
PROTHROMBIN TIME: 20.7 s — AB (ref 11.6–15.2)

## 2014-12-15 MED ORDER — RACEPINEPHRINE HCL 2.25 % IN NEBU
INHALATION_SOLUTION | RESPIRATORY_TRACT | Status: AC
  Administered 2014-12-15: 0.5 mL
  Filled 2014-12-15: qty 0.5

## 2014-12-15 MED ORDER — PROPRANOLOL HCL 20 MG PO TABS
10.0000 mg | ORAL_TABLET | Freq: Two times a day (BID) | ORAL | Status: DC
  Administered 2014-12-15 – 2014-12-17 (×5): 10 mg via ORAL
  Filled 2014-12-15 (×5): qty 1

## 2014-12-15 MED ORDER — FUROSEMIDE 20 MG PO TABS
20.0000 mg | ORAL_TABLET | Freq: Two times a day (BID) | ORAL | Status: DC
  Administered 2014-12-15 – 2014-12-17 (×5): 20 mg via ORAL
  Filled 2014-12-15 (×5): qty 1

## 2014-12-15 MED ORDER — PREDNISOLONE 15 MG/5ML PO SOLN
40.0000 mg | Freq: Every day | ORAL | Status: DC
  Administered 2014-12-15 – 2014-12-17 (×3): 40 mg via ORAL
  Filled 2014-12-15 (×5): qty 15

## 2014-12-15 MED ORDER — SPIRONOLACTONE 25 MG PO TABS
25.0000 mg | ORAL_TABLET | Freq: Two times a day (BID) | ORAL | Status: DC
  Administered 2014-12-15 – 2014-12-17 (×5): 25 mg via ORAL
  Filled 2014-12-15 (×5): qty 1

## 2014-12-15 MED ORDER — RACEPINEPHRINE HCL 2.25 % IN NEBU
0.5000 mL | INHALATION_SOLUTION | RESPIRATORY_TRACT | Status: DC | PRN
Start: 2014-12-15 — End: 2014-12-15

## 2014-12-15 MED ORDER — HYDROCORTISONE 2.5 % RE CREA
TOPICAL_CREAM | Freq: Two times a day (BID) | RECTAL | Status: DC
  Administered 2014-12-15 – 2014-12-16 (×3): via RECTAL
  Filled 2014-12-15 (×2): qty 28.35

## 2014-12-15 MED ORDER — POTASSIUM CHLORIDE CRYS ER 20 MEQ PO TBCR
40.0000 meq | EXTENDED_RELEASE_TABLET | Freq: Once | ORAL | Status: AC
  Administered 2014-12-15: 40 meq via ORAL
  Filled 2014-12-15: qty 2

## 2014-12-15 NOTE — Progress Notes (Signed)
[facility] PROGRESS NOTE Interim History: 64 yo male comes in with over a week of progressive weakness and swelling all over, also turning yellow. Pt states he went to see dr hall 2 weeks ago, had a bunch of blood work done but has not heard back from his office, he was told by dr hall that he needed to cut back on his drinking, he relates he has cut back on his drinking is only been drinking 4 beers a day.   Assessment/Plan: Acute hepatic liver failure Jaundice (billirubinemia) and Cirrhosis of liver/Alcohol abuse: -Liver enzymes are slowly trending down -Appreciate GIs assistance -Started on oral steroids for alcoholic hepatitis. -Patient advised to refrain from further alcohol -Continue to follow labs   Hypovolemia Hyponatremia: -he does have evidence of hypervolemia. Will start on lasix and aldactone. May need albumin infusion to help with diuresis.  Hypokalemia/Hypomagnasemia: - potassium improving, recheck magnesium  Essential hypertension: - blood pressure stable  Severe protein caloric malnutrition: - Nutrition consult  Leukocytosis: - With mild left shift, he has remained afebrile. - no diarhea, started on lactulose. - ? Due to liver failure/steroids  Code Status: full Family Communication: brother  Disposition Plan: 2-3 days   Consultants:  GI  Procedures:  [place] abd: cirrhosis no ascities  Antibiotics:  None  HPI/Subjective: Feeling better today. Feels less weak, no nausea or vomiting, hasn't gotten out of bed yet  Objective: Filed Vitals:   12/14/14 1550 12/14/14 2142 12/15/14 0430 12/15/14 0643  BP: 139/87 147/83 123/81   Pulse: 100 102 108   Temp:  99.1 F (37.3 C) 97.4 F (36.3 C)   TempSrc:  Oral Oral   Resp: Height:      Weight:    85.231 kg (187 lb 14.4 oz)  SpO2: 98% 95% 95%     Intake/Output Summary (Last 24 hours) at 12/15/14 0930 Last data filed at 12/15/14 0700  Gross per 24 hour  Intake    120 ml    Output   1900 ml  Net  -1780 ml   Filed Weights   12/14/14 0057 12/14/14 0350 12/15/14 0643  Weight: 83.915 kg (185 lb) 88.7 kg (195 lb 8.8 oz) 85.231 kg (187 lb 14.4 oz)    Exam:  General: Alert, awake, oriented x3, in no acute distress.  HEENT: No bruits, no goiter. Jaundice Heart: s1, s2, tachycardic Lungs: Good air movement, clear no rales. Abdomen: Soft,  distended no fluid wave appreciated. positive bowel sounds.  Neuro: Grossly intact   Data Reviewed: Basic Metabolic Panel:  Recent Labs Lab 12/14/14 0125 12/14/14 0349 12/15/14 0533  NA 127* 128* 131*  K 2.6* 2.8* 3.3*  CL 86* 88* 92*  CO2 GLUCOSE 112* 112* 110*  BUN CREATININE 1.10 1.00 0.66  CALCIUM 7.6* 7.5* 7.7*  MG  --  1.2*  --    Liver Function Tests:  Recent Labs Lab 12/14/14 0125 12/15/14 0533  AST 109* 110*  ALT 26 23  ALKPHOS 216* 198*  BILITOT 13.1* 10.3*  PROT 6.5 5.6*  ALBUMIN 1.8* 1.4*    Recent Labs Lab 12/14/14 0125  LIPASE 23    Recent Labs Lab 12/14/14 0349  AMMONIA 93*   CBC:  Recent Labs Lab 12/14/14 0125 12/15/14 0533  WBC 18.7* 17.6*  NEUTROABS 13.3*  --   HGB 12.0* 10.6*  HCT 33.6* 29.9*  MCV 96.6 97.4  PLT 278 285   Cardiac Enzymes:  Recent Labs Lab 12/14/14 0125  TROPONINI <0.03   BNP (last 3 results)  Recent Labs  12/14/14 0125  BNP 250.0*    ProBNP (last 3 results)  Recent Labs  04/25/14 0620  PROBNP 324.5*    CBG: No results for input(s): GLUCAP in the last 168 hours.  No results found for this or any previous visit (from the past 240 hour(s)).   Studies: [place] Abdomen Complete  12/14/2014   CLINICAL DATA:  Ascites for 1 week.  EXAM: ULTRASOUND ABDOMEN COMPLETE  COMPARISON:  None.  FINDINGS: Gallbladder: The gallbladder wall appears thickened measuring 9.8 mm. Sludge is identified within the lumen of the gallbladder. No sonographic Murphy's sign.  Common bile duct: Diameter: 4.3 mm.  Liver: The liver appears  diffusely heterogeneous and echogenic with a nodular contour.  IVC: No abnormality visualized.  Pancreas: Not visualized  Spleen: Not visualized due to overlying bowel gas.  Right Kidney: Length: 12.2 cm. Echogenicity within normal limits. No mass or hydronephrosis visualized.  Left Kidney: Length: 13 cm. Echogenicity within normal limits. No mass or hydronephrosis visualized.  Abdominal aorta: Not visualized due to overlying bowel gas.  Other findings: Trace ascites noted.  IMPRESSION: 1. Morphologic features of the liver which are suggestive of cirrhosis. 2. Gallbladder wall thickening and gallbladder sludge. In the setting of ascites and cirrhosis the gallbladder wall thickening may be a nonspecific finding. Correlate for any clinical signs or symptoms of cholecystitis.   Electronically Signed   By: Signa Kellaylor  Stroud M.D.   On: 12/14/2014 09:05   Dg Chest Portable 1 View  12/14/2014   CLINICAL DATA:  Initial evaluation for acute weakness, fatigue.  EXAM: PORTABLE CHEST - 1 VIEW  COMPARISON:  Prior radiograph from 04/25/2014  FINDINGS: The cardiac and mediastinal silhouettes are stable in size and contour, and remain within normal limits.  The lungs are hypoinflated. No airspace consolidation, pleural effusion, or pulmonary edema is identified. There is no pneumothorax.  Remote right-sided rib fractures again noted. No acute osseous abnormality.  IMPRESSION: Shallow lung inflation with no acute cardiopulmonary abnormality identified.   Electronically Signed   By: Rise MuBenjamin  McClintock M.D.   On: 12/14/2014 02:13    Scheduled Meds: . feeding supplement (ENSURE ENLIVE)  237 mL Oral BID BM  . folic acid  1 mg Oral Daily  . furosemide  20 mg Oral BID  . hydrocortisone   Rectal BID  . lactulose  20 g Oral TID  . LORazepam  0-4 mg Intravenous Q6H   Followed by  . [START ON 12/16/2014] LORazepam  0-4 mg Intravenous Q12H  . multivitamin with minerals  1 tablet Oral Daily  . pantoprazole  40 mg Oral QAC  breakfast  . prednisoLONE  40 mg Oral Daily  . propranolol  10 mg Oral BID  . rifaximin  550 mg Oral BID  . sodium chloride  3 mL Intravenous Q12H  . spironolactone  25 mg Oral BID  . thiamine  100 mg Oral Daily   Or  . thiamine  100 mg Intravenous Daily   Continuous Infusions:   Time Spent: 30 min   MEMON,JEHANZEB  [facility] Pager [phone]. If 7PM-7AM, please contact night-coverage at [email or web address], password [email or web address] 12/15/2014, 9:30 AM  LOS: 1 day

## 2014-12-15 NOTE — Progress Notes (Signed)
PT Cancellation Note  Patient Details Name: Billy Stone MRN: [id number] DOB: [birth year]   Cancelled Treatment:    Reason Eval/Treat Not Completed: Patient's level of consciousness.  Pt is unable to stay awake for PT eval.  Will try again tomorrow.   Myrlene BrokerBrown, Nathen Balaban L 12/15/2014, 11:50 AM

## 2014-12-15 NOTE — Progress Notes (Signed)
Pt has stridor, RT tried normal saline aerosol mask. The stridor seem to be a little better but was still pretty significant. RT paged Dr. Kerry HoughMemon and asked him if he could order racemic. Racemic was given with some improvement. Pt stated its allergies and he is breathing like he usually does..Marland Kitchen

## 2014-12-15 NOTE — Progress Notes (Signed)
Subjective: More alert today. Denies abdominal pain, N/V. Small amount of bright red blood in stool yesterday evening. None this morning with small BM.   Objective: Vital signs in last 24 hours: Temp:  [97.4 F (36.3 C)-99.1 F (37.3 C)] 97.4 F (36.3 C) (04/19 0430) Pulse Rate:  [100-108] 108 (04/19 0430) Resp:  [18] 18 (04/19 0430) BP: (123-147)/(81-87) 123/81 mmHg (04/19 0430) SpO2:  [95 %-98 %] 95 % (04/19 0430) Weight:  [187 lb 14.4 oz (85.231 kg)] 187 lb 14.4 oz (85.231 kg) (04/19 0643) Last BM Date: 12/14/14 General:   Alert and oriented, pleasant Head:  Normocephalic and atraumatic. Eyes:  +scleral icterus Abdomen:  Round, no TTP, +BS, ventral hernia to right of umbilicus, palpable hepatomegaly Extremities:  2+ lower extremity edema Neurologic:  Alert and  oriented x4, possible mild asterixis but improved from yesterday Skin:  Jaundiced   Intake/Output from previous day: 04/18 0701 - 04/19 0700 In: 120 [I.V.:120] Out: 3400 [Urine:3400] Intake/Output this shift:    Lab Results:  Recent Labs  12/14/14 0125 12/15/14 0533  WBC 18.7* 17.6*  HGB 12.0* 10.6*  HCT 33.6* 29.9*  PLT 278 285   BMET  Recent Labs  12/14/14 0125 12/14/14 0349 12/15/14 0533  NA 127* 128* 131*  K 2.6* 2.8* 3.3*  CL 86* 88* 92*  CO2 27 25 29   GLUCOSE 112* 112* 110*  BUN 16 16 12   CREATININE 1.10 1.00 0.66  CALCIUM 7.6* 7.5* 7.7*   LFT  Recent Labs  12/14/14 0125 12/15/14 0533  PROT 6.5 5.6*  ALBUMIN 1.8* 1.4*  AST 109* 110*  ALT 26 23  ALKPHOS 216* 198*  BILITOT 13.1* 10.3*   PT/INR  Recent Labs  12/14/14 0125  LABPROT 21.6*  INR 1.86*   Hepatitis Panel  Recent Labs  12/14/14 1017  HEPBSAG NEGATIVE  HCVAB NEGATIVE  HEPAIGM NON REACTIVE  HEPBIGM NON REACTIVE   Ammonia 4/18: 93  Studies/Results: [place] Abdomen Complete  12/14/2014   CLINICAL DATA:  Ascites for 1 week.  EXAM: ULTRASOUND ABDOMEN COMPLETE  COMPARISON:  None.  FINDINGS: Gallbladder: The  gallbladder wall appears thickened measuring 9.8 mm. Sludge is identified within the lumen of the gallbladder. No sonographic Murphy's sign.  Common bile duct: Diameter: 4.3 mm.  Liver: The liver appears diffusely heterogeneous and echogenic with a nodular contour.  IVC: No abnormality visualized.  Pancreas: Not visualized  Spleen: Not visualized due to overlying bowel gas.  Right Kidney: Length: 12.2 cm. Echogenicity within normal limits. No mass or hydronephrosis visualized.  Left Kidney: Length: 13 cm. Echogenicity within normal limits. No mass or hydronephrosis visualized.  Abdominal aorta: Not visualized due to overlying bowel gas.  Other findings: Trace ascites noted.  IMPRESSION: 1. Morphologic features of the liver which are suggestive of cirrhosis. 2. Gallbladder wall thickening and gallbladder sludge. In the setting of ascites and cirrhosis the gallbladder wall thickening may be a nonspecific finding. Correlate for any clinical signs or symptoms of cholecystitis.   Electronically Signed   By: Signa Kellaylor  Stroud M.D.   On: 12/14/2014 09:05   Dg Chest Portable 1 View  12/14/2014   CLINICAL DATA:  Initial evaluation for acute weakness, fatigue.  EXAM: PORTABLE CHEST - 1 VIEW  COMPARISON:  Prior radiograph from 04/25/2014  FINDINGS: The cardiac and mediastinal silhouettes are stable in size and contour, and remain within normal limits.  The lungs are hypoinflated. No airspace consolidation, pleural effusion, or pulmonary edema is identified. There is no pneumothorax.  Remote  right-sided rib fractures again noted. No acute osseous abnormality.  IMPRESSION: Shallow lung inflation with no acute cardiopulmonary abnormality identified.   Electronically Signed   By: Rise Mu M.D.   On: 12/14/2014 02:13    Assessment: 64 year old male admitted with alcoholic hepatitis, US abdomen with likely cirrhosis, clinically improved from yesterday at time of consultation. Negative viral markers, no signs/symptoms  of infection. With elevated discriminant function, will start prednisolone as viral markers are negative.   Rectal bleeding: likely benign anorectal source. No prior colonoscopy. Does not appear hemodynamically significant. Start Anusol and plan on outpatient colonoscopy.      Plan: Prednisolone 40 mg daily for 30 days, then rapid taper over 2 weeks Anusol cream per rectum BID X 7 days Continue PPI Lactulose titrated for 3 soft BMs daily, Xifaxan BID Repeat INR today HFP/INR in am Elastography in several months after acute episode resolved Outpatient evaluation for TCS/EGD ETOH cessation   [name], ANP-BC [facility]     LOS: 1 day    12/15/2014, 8:16 AM

## 2014-12-16 ENCOUNTER — Inpatient Hospital Stay (HOSPITAL_COMMUNITY): Payer: Medicare Other

## 2014-12-16 DIAGNOSIS — K7031 Alcoholic cirrhosis of liver with ascites: Secondary | ICD-10-CM

## 2014-12-16 DIAGNOSIS — K7011 Alcoholic hepatitis with ascites: Secondary | ICD-10-CM | POA: Insufficient documentation

## 2014-12-16 LAB — CBC WITH DIFFERENTIAL/PLATELET
BASOS ABS: 0 10*3/uL (ref 0.0–0.1)
BASOS PCT: 0 % (ref 0–1)
Eosinophils Absolute: 0 10*3/uL (ref 0.0–0.7)
Eosinophils Relative: 0 % (ref 0–5)
HCT: 29.6 % — ABNORMAL LOW (ref 39.0–52.0)
HEMOGLOBIN: 10.5 g/dL — AB (ref 13.0–17.0)
Lymphocytes Relative: 25 % (ref 12–46)
Lymphs Abs: 5.3 10*3/uL — ABNORMAL HIGH (ref 0.7–4.0)
MCH: 35.1 pg — AB (ref 26.0–34.0)
MCHC: 35.5 g/dL (ref 30.0–36.0)
MCV: 99 fL (ref 78.0–100.0)
MONO ABS: 2 10*3/uL — AB (ref 0.1–1.0)
MONOS PCT: 10 % (ref 3–12)
NEUTROS ABS: 13.6 10*3/uL — AB (ref 1.7–7.7)
NEUTROS PCT: 65 % (ref 43–77)
Platelets: 294 10*3/uL (ref 150–400)
RBC: 2.99 MIL/uL — AB (ref 4.22–5.81)
RDW: 19.2 % — ABNORMAL HIGH (ref 11.5–15.5)
WBC: 21 10*3/uL — AB (ref 4.0–10.5)

## 2014-12-16 LAB — COMPREHENSIVE METABOLIC PANEL
ALT: 24 U/L (ref 0–53)
ANION GAP: 10 (ref 5–15)
AST: 99 U/L — ABNORMAL HIGH (ref 0–37)
Albumin: 1.5 g/dL — ABNORMAL LOW (ref 3.5–5.2)
Alkaline Phosphatase: 217 U/L — ABNORMAL HIGH (ref 39–117)
BUN: 11 mg/dL (ref 6–23)
CALCIUM: 7.9 mg/dL — AB (ref 8.4–10.5)
CHLORIDE: 91 mmol/L — AB (ref 96–112)
CO2: 30 mmol/L (ref 19–32)
Creatinine, Ser: 0.58 mg/dL (ref 0.50–1.35)
GFR calc Af Amer: >90 mL/min (ref >90)
GLUCOSE: 162 mg/dL — AB (ref 70–99)
Potassium: 3.7 mmol/L (ref 3.5–5.1)
Sodium: 131 mmol/L — ABNORMAL LOW (ref 135–145)
Total Bilirubin: 8 mg/dL — ABNORMAL HIGH (ref 0.3–1.2)
Total Protein: 5.8 g/dL — ABNORMAL LOW (ref 6.0–8.3)

## 2014-12-16 LAB — PROTIME-INR
INR: 1.88 — ABNORMAL HIGH (ref 0.00–1.49)
Prothrombin Time: 21.8 seconds — ABNORMAL HIGH (ref 11.6–15.2)

## 2014-12-16 IMAGING — CR DG CHEST 1V PORT
1 series · 2 of 2 positions shown · non-contrast
Comparison: [DATE]

CLINICAL DATA: Swelling all over.

EXAM:
PORTABLE CHEST - 1 VIEW

[Series 1: ap portable · 0.17mm/px · 2 of 2 slices shown]
[im 1/2]
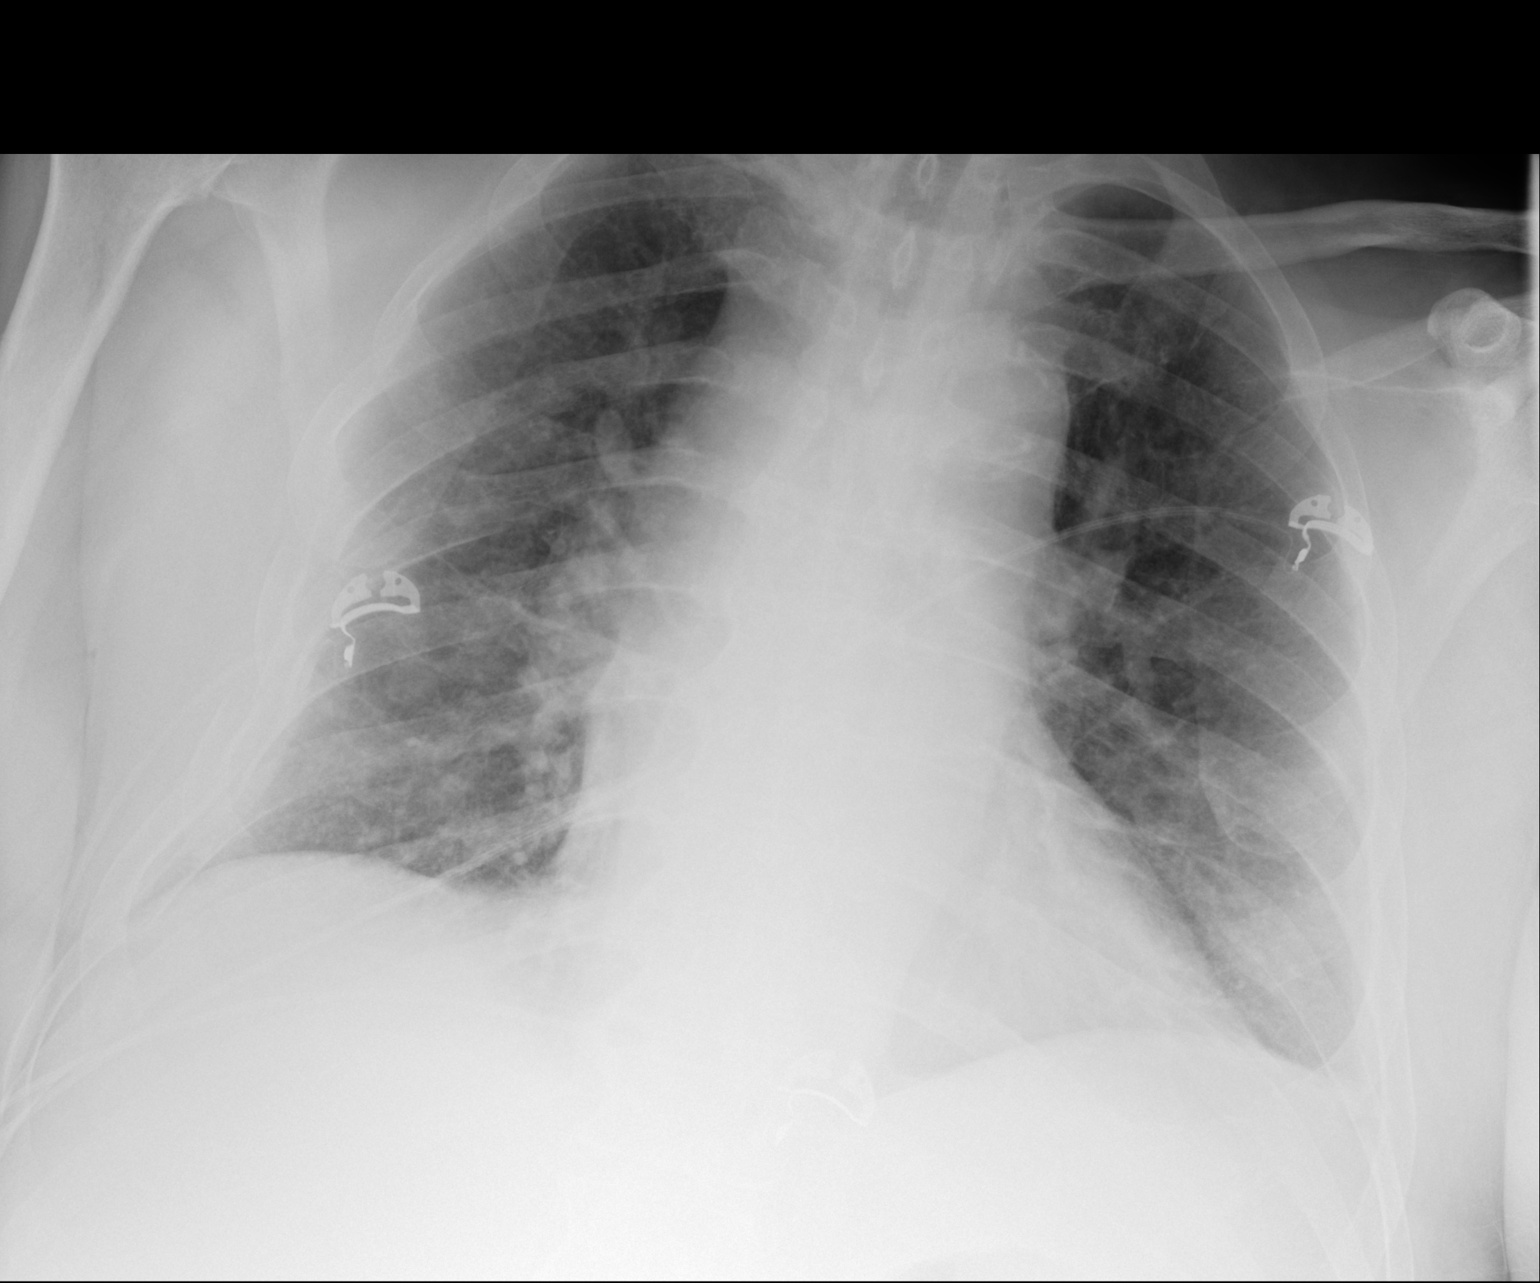
[im 2/2]
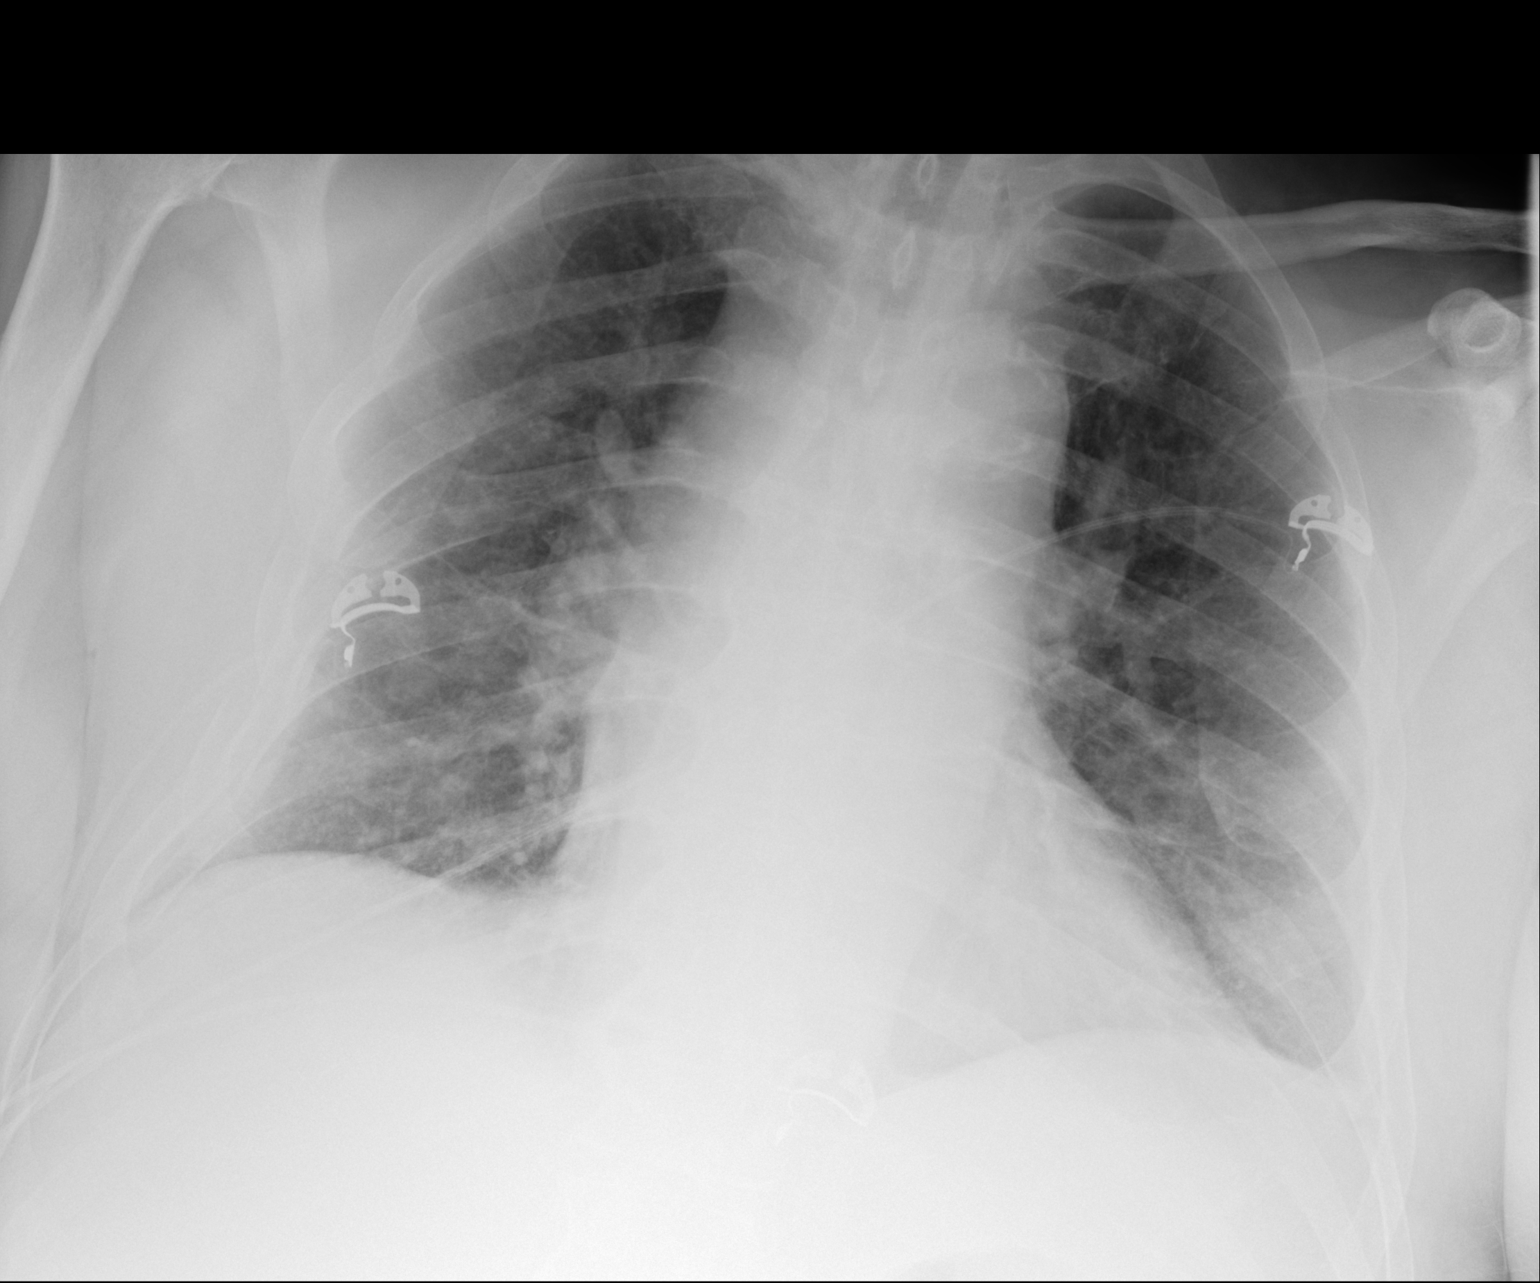

[2 of 2 positions shown; findings below may reference images not displayed]

FINDINGS: Heart is upper limits normal in size. Mild vascular congestion. No
confluent opacities or effusions. No acute bony abnormality.
IMPRESSION: Mild vascular congestion.

## 2014-12-16 MED ORDER — IPRATROPIUM-ALBUTEROL 0.5-2.5 (3) MG/3ML IN SOLN
3.0000 mL | RESPIRATORY_TRACT | Status: DC
  Administered 2014-12-16: 3 mL via RESPIRATORY_TRACT
  Filled 2014-12-16: qty 3

## 2014-12-16 MED ORDER — FUROSEMIDE 10 MG/ML IJ SOLN
40.0000 mg | Freq: Once | INTRAMUSCULAR | Status: AC
  Administered 2014-12-16: 40 mg via INTRAVENOUS
  Filled 2014-12-16: qty 4

## 2014-12-16 MED ORDER — LACTULOSE 10 GM/15ML PO SOLN
20.0000 g | Freq: Two times a day (BID) | ORAL | Status: DC
  Administered 2014-12-16 – 2014-12-17 (×3): 20 g via ORAL
  Filled 2014-12-16 (×3): qty 30

## 2014-12-16 MED ORDER — IPRATROPIUM-ALBUTEROL 0.5-2.5 (3) MG/3ML IN SOLN
3.0000 mL | Freq: Three times a day (TID) | RESPIRATORY_TRACT | Status: DC
  Administered 2014-12-16 – 2014-12-17 (×2): 3 mL via RESPIRATORY_TRACT
  Filled 2014-12-16 (×2): qty 3

## 2014-12-16 NOTE — Evaluation (Signed)
Physical Therapy Evaluation Patient Details Name: Billy BolkWalter L Stone MRN: [id number] DOB: [birth year] Today's Date: 12/16/2014   History of Present Illness  64 yo male comes in with over a week of progressive weakness and swelling all over, also turning yellow. Pt states he went to see dr hall 2 weeks ago, had a bunch of blood work done but has not heard back from his office, he was told by dr hall that he needed to cut back on his drinking etoh to only 4 beers a day which pt has been doing. Has longstanding h/o etoh abuse. Pt is not aware of any liver disease, and is not aware of anyone telling him he has needed to stop drinking except for dr hall 2 weeks ago. He denies any pain. No n/v/d. He has prostate issues, so i/o caths himself at home chronically. Pt is jaundiced, with decompensated liver failure at this time although his vitals are normal has low potassium level.  Pt lives with his brother and had been independent at home up until several weeks ago.  He has become weaker and confused, has fallen at home.  He normally ambulates with a cane.  Clinical Impression   Pt was seen for evaluation.  He is now awake and cooperative, aware of where he is but confused as to circumstances.  His conversation takes tangents at times.He is able to follow directions but it sometimes takes him time and repeated instruction in order to complete.  He is generally weak and has decreased standing balance.  He needs mod assist to sit at edge of bed.  He is unable to ambulate due to poor strength and balance.  I am strongly recommending SNF at d/c and pt/brother are in agreement.    Follow Up Recommendations SNF    Equipment Recommendations  None recommended by PT    Recommendations for Other Services   OT    Precautions / Restrictions Precautions Precautions: Fall Restrictions Weight Bearing Restrictions: No      Mobility  Bed Mobility Overal bed mobility: Needs Assistance Bed Mobility: Supine to  Sit     Supine to sit: Mod assist     General bed mobility comments: weak abdominal muscles and needs assist to sit  Transfers Overall transfer level: Needs assistance Equipment used: Rolling walker (2 wheeled) Transfers: Sit to/from [employer] CorporationStand;Stand Pivot Transfers Sit to Stand: Min guard Stand pivot transfers: Min assist       General transfer comment: standing balance is poor to fair and stands with a very wide base of support...uses a walker to pivot bed to chair and is unable to ambulate due to weakness                                Balance Overall balance assessment: Needs assistance Sitting-balance support: No upper extremity supported;Feet supported Sitting balance-Leahy Scale: Good     Standing balance support: No upper extremity supported Standing balance-Leahy Scale: Poor                               Pertinent Vitals/Pain Pain Assessment: No/denies pain    Home Living Family/patient expects to be discharged to:: Skilled nursing facility Living Arrangements: Other relatives                    Prior Function Level of Independence: Independent with assistive device(s)  Comments: uses a cane for gait             Extremity/Trunk Assessment   Upper Extremity Assessment: Generalized weakness           Lower Extremity Assessment: Generalized weakness         Communication   Communication: No difficulties  Cognition Arousal/Alertness: Awake/alert Behavior During Therapy: WFL for tasks assessed/performed Overall Cognitive Status: History of cognitive impairments - at baseline       Memory: Decreased short-term memory                            Assessment/Plan    PT Assessment Patient needs continued PT services  PT Diagnosis Difficulty walking;Generalized weakness (poor balance)   PT Problem List Decreased strength;Decreased activity tolerance;Decreased balance;Decreased  mobility;Decreased knowledge of use of DME  PT Treatment Interventions Gait training;Functional mobility training;Therapeutic exercise;Balance training   PT Goals (Current goals can be found in the Care Plan section) Acute Rehab PT Goals Patient Stated Goal: none stated PT Goal Formulation: With patient/family Time For Goal Achievement: 12/30/14 Potential to Achieve Goals: Fair    Frequency Min 3X/week   Barriers to discharge   brother at home intermittently                   End of Session Equipment Utilized During Treatment: Gait belt Activity Tolerance: Patient limited by fatigue Patient left: in chair;with call bell/phone within reach;with family/visitor present (brother to stay with pt while he is up in a chair) Nurse Communication: Mobility status         Time: 7829-5621 PT Time Calculation (min) (ACUTE ONLY): 32 min   Charges:   PT Evaluation $Initial PT Evaluation Tier I: 1 Procedure           Myrlene Broker L 12/16/2014, 9:07 AM

## 2014-12-16 NOTE — Progress Notes (Signed)
Subjective:  Sitting up in chair eating breakfast. Denies abdominal pain. Complains of lots of loose stools this morning.   Objective: Vital signs in last 24 hours: Temp:  [97.6 F (36.4 C)-98.4 F (36.9 C)] 98.4 F (36.9 C) (04/20 0631) Pulse Rate:  [69-112] 69 (04/20 0631) Resp:  [18] 18 (04/20 0631) BP: (123-141)/(70-88) 125/70 mmHg (04/20 0631) SpO2:  [96 %-99 %] 96 % (04/20 0631) Weight:  [186 lb 8.2 oz (84.6 kg)] 186 lb 8.2 oz (84.6 kg) (04/20 0631) Last BM Date: 12/15/14 General:   Alert,  Well-developed, well-nourished, pleasant and cooperative in NAD Head:  Normocephalic and atraumatic. Eyes:  Sclera clear, no icterus.  Chest: CTA bilaterally without rales, rhonchi, crackles.    Heart:  Regular rate and rhythm; no murmurs, clicks, rubs,  or gallops. Abdomen:  Soft, some distention but not tense. nontender. Hyperactive BS.   Extremities:  Without clubbing, deformity. 1-2+ pitting edema at ankles, left greater than right.  Neurologic:  Alert and  oriented x4;  grossly normal neurologically. Slightly asterixis. Skin:  Intact without significant lesions or rashes. Psych:  Alert and cooperative. Normal mood and affect.  Intake/Output from previous day: 04/19 0701 - 04/20 0700 In: 1080 [P.O.:1080] Out: 2350 [Urine:2350] Intake/Output this shift:    Lab Results: CBC  Recent Labs  12/14/14 0125 12/15/14 0533 12/16/14 0522  WBC 18.7* 17.6* 21.0*  HGB 12.0* 10.6* 10.5*  HCT 33.6* 29.9* 29.6*  MCV 96.6 97.4 99.0  PLT 278 285 294   BMET  Recent Labs  12/14/14 0349 12/15/14 0533 12/16/14 0522  NA 128* 131* 131*  K 2.8* 3.3* 3.7  CL 88* 92* 91*  CO2 25 29 30   GLUCOSE 112* 110* 162*  BUN 16 12 11   CREATININE 1.00 0.66 0.58  CALCIUM 7.5* 7.7* 7.9*   LFTs  Recent Labs  12/14/14 0125 12/15/14 0533 12/16/14 0522  BILITOT 13.1* 10.3* 8.0*  ALKPHOS 216* 198* 217*  AST 109* 110* 99*  ALT 26 23 24   PROT 6.5 5.6* 5.8*  ALBUMIN 1.8* 1.4* 1.5*    Recent  Labs  12/14/14 0125  LIPASE 23   PT/INR  Recent Labs  12/14/14 0125 12/15/14 0954 12/16/14 0522  LABPROT 21.6* 20.7* 21.8*  INR 1.86* 1.76* 1.88*      Imaging Studies: [place] Abdomen Complete  12/14/2014   CLINICAL DATA:  Ascites for 1 week.  EXAM: ULTRASOUND ABDOMEN COMPLETE  COMPARISON:  None.  FINDINGS: Gallbladder: The gallbladder wall appears thickened measuring 9.8 mm. Sludge is identified within the lumen of the gallbladder. No sonographic Murphy's sign.  Common bile duct: Diameter: 4.3 mm.  Liver: The liver appears diffusely heterogeneous and echogenic with a nodular contour.  IVC: No abnormality visualized.  Pancreas: Not visualized  Spleen: Not visualized due to overlying bowel gas.  Right Kidney: Length: 12.2 cm. Echogenicity within normal limits. No mass or hydronephrosis visualized.  Left Kidney: Length: 13 cm. Echogenicity within normal limits. No mass or hydronephrosis visualized.  Abdominal aorta: Not visualized due to overlying bowel gas.  Other findings: Trace ascites noted.  IMPRESSION: 1. Morphologic features of the liver which are suggestive of cirrhosis. 2. Gallbladder wall thickening and gallbladder sludge. In the setting of ascites and cirrhosis the gallbladder wall thickening may be a nonspecific finding. Correlate for any clinical signs or symptoms of cholecystitis.   Electronically Signed   By: Signa Kellaylor  Stroud M.D.   On: 12/14/2014 09:05   Dg Chest Portable 1 View  12/14/2014   CLINICAL DATA:  Initial evaluation for acute weakness, fatigue.  EXAM: PORTABLE CHEST - 1 VIEW  COMPARISON:  Prior radiograph from 04/25/2014  FINDINGS: The cardiac and mediastinal silhouettes are stable in size and contour, and remain within normal limits.  The lungs are hypoinflated. No airspace consolidation, pleural effusion, or pulmonary edema is identified. There is no pneumothorax.  Remote right-sided rib fractures again noted. No acute osseous abnormality.  IMPRESSION: Shallow lung inflation  with no acute cardiopulmonary abnormality identified.   Electronically Signed   By: Rise Mu M.D.   On: 12/14/2014 02:13  [2 weeks]   Assessment: 64 year old male admitted with alcoholic hepatitis, US abdomen with likely cirrhosis. Negative viral markers, no signs/symptoms of infection. With elevated discriminant function, prednisolone started. Slight worsening leukocytosis. ?related to prednisolone. Continue to monitor. Doubt SBP, with limited ascites present.   Rectal bleeding: likely benign anorectal source. No prior colonoscopy. Does not appear hemodynamically significant. Start Anusol and plan on outpatient colonoscopy.   Lower extremity edema, slightly more on the right side. Discussed with Dr. Kerry Hough. To be assessed this morning, likely doppler study planned.  Plan: Prednisolone 40 mg daily for 30 days, then rapid taper over 2 weeks. Continue to monitor LFTs after discharge. Anusol cream per rectum BID X 7 days Continue PPI Xifaxan BID Decrease lactulose some with goal of 3 soft BM daily. Outpatient evaluation for TCS/EGD ETOH cessation Will continue to follow with you.  Leanna Battles. Dixon Boos [facility] [phone] 4/20/20169:01 AM     LOS: 2 days    Attending note:  Patient seen and examined. Agree with above assessment and recommendations. Plan of management discussed with family at the bedside.

## 2014-12-16 NOTE — Clinical Social Work Note (Signed)
Clinical Social Work Assessment  Patient Details  Name: Billy Stone MRN: [id number] Date of Birth: 1950-09-03  Date of referral:  12/16/14               Reason for consult:  Facility Placement                Permission sought to share information with:  Family Supports Permission granted to share information::  Yes, Verbal Permission Granted  Name::     [name]::     Relationship::  family  Contact Information:  [place] [phone]  Housing/Transportation Living arrangements for the past 2 months:  Single Family Home Source of Information:  Patient Patient Interpreter Needed:  None Criminal Activity/Legal Involvement Pertinent to Current Situation/Hospitalization:  No - Comment as needed Significant Relationships:  Adult Children, Siblings Lives with:  Siblings Do you feel safe going back to the place where you live?  Yes Need for family participation in patient care:  Yes (Comment)  Care giving concerns:  CSW met with patient to discuss ?SNF per PT and MD recommendations. Patient agrees to pursue SNF for rehab.   Social Worker assessment / plan:  CSW discussed with patient the recommendation for SNF at d.c- patient reports he has never been to SNF but is agreeable. 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  He spoke a lot about his work life and the enjoyment he had traveling and doing his work. He now lives with his brother and is open to considering SNF for some rehab at dc- "I am weak and need the rehab". CSW discussed SNF process and coverage for SNF- he agrees and is open to pursuing SNF options at this time. CSW will complete FL2 and PASARR for SNF placement and advise- patient's brother prefers [facility].  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  Both appear to be hopeful for a short SNF rehab stay and then return home.    Patient/Family's Understanding of and Emotional Response to Diagnosis, Current Treatment, and Prognosis:   Patient understands and acknowledges his etoh use and its impact on his health- They also understand the recommendations for SNF at dc and the process for SNF search. He is hopeful for a short SNF stay and to return home and remain etoh free.   Emotional Assessment Appearance:  Well-Groomed, Developmentally appropriate, Appears stated age Attitude/Demeanor/Rapport:    Affect (typically observed):  Accepting Orientation:  Oriented to Self, Oriented to Place, Oriented to  Time, Oriented to Situation Alcohol / Substance use:  Alcohol Use Psych involvement (Current and /or in the community):  No (Comment)  Discharge Needs  Concerns to be addressed:  Discharge Planning Concerns Readmission within the last 30 days:  No Current discharge risk:  Substance Abuse Barriers to Discharge:  No Barriers Identified   Ludwig Clarks, LCSW 12/16/2014, 2:32 PM

## 2014-12-16 NOTE — Progress Notes (Signed)
[facility] PROGRESS NOTE Interim History: 64 yo male comes in with over a week of progressive weakness and swelling all over, also turning yellow. Pt states he went to see dr hall 2 weeks ago, had a bunch of blood work done but has not heard back from his office, he was told by dr hall that he needed to cut back on his drinking, he relates he has cut back on his drinking is only been drinking 4 beers a day.   Assessment/Plan: Acute hepatic liver failure Jaundice (billirubinemia) and Cirrhosis of liver/Alcohol abuse: -Liver enzymes are slowly trending down -Appreciate GIs assistance -Continue oral steroids for alcoholic hepatitis. -Patient advised to refrain from further alcohol -Continue to follow labs   Hypervolemia Hyponatremia: -he does have evidence of hypervolemia. On lasix and aldactone with fair urine output. Continue current treatments .  Hypokalemia/Hypomagnasemia: - potassium improving,   Essential hypertension: - blood pressure stable  Severe protein caloric malnutrition: - Nutrition consult  Leukocytosis: - With mild left shift, he has remained afebrile. - no diarhea, started on lactulose. - ? Due to liver failure/steroids -does not appear septic/toxic, will check C diff pcr since he is having diarrhea. - will likely plan follow up as outpatient  Shortness of breath - Check chest xray, start nebs since he is wheezing  Code Status: full Family Communication: brother  Disposition Plan: SNF placement   Consultants:  GI  Procedures:  [place] abd: cirrhosis no ascities  Antibiotics:  None  HPI/Subjective: Feels short of breath today. Noted that he was wheezing more and coughing today. Has has several bowel movements today.  Objective: Filed Vitals:   12/16/14 0631 12/16/14 1004 12/16/14 1510 12/16/14 1519  BP: 125/70 115/73 147/80   Pulse: 69 67 67   Temp: 98.4 F (36.9 C)     TempSrc: Oral     Resp: Height:      Weight: 84.6  kg (186 lb 8.2 oz)     SpO2: 96% 98% 98% 98%    Intake/Output Summary (Last 24 hours) at 12/16/14 1807 Last data filed at 12/16/14 1500  Gross per 24 hour  Intake    240 ml  Output   2000 ml  Net  -1760 ml   Filed Weights   12/14/14 0350 12/15/14 0643 12/16/14 0631  Weight: 88.7 kg (195 lb 8.8 oz) 85.231 kg (187 lb 14.4 oz) 84.6 kg (186 lb 8.2 oz)    Exam:  General: Alert, awake, oriented x3, in no acute distress.  HEENT: No bruits, no goiter. Jaundice Heart: s1, s2, RRR, 1-2+ edema b/l Lungs: crackles at bases with mild wheeze bilaterally Abdomen: Soft,  distended no fluid wave appreciated. positive bowel sounds.  Neuro: Grossly intact   Data Reviewed: Basic Metabolic Panel:  Recent Labs Lab 12/14/14 0125 12/14/14 0349 12/15/14 0533 12/15/14 0954 12/16/14 0522  NA 127* 128* 131*  --  131*  K 2.6* 2.8* 3.3*  --  3.7  CL 86* 88* 92*  --  91*  CO2 --  30  GLUCOSE 112* 112* 110*  --  162*  BUN --  11  CREATININE 1.10 1.00 0.66  --  0.58  CALCIUM 7.6* 7.5* 7.7*  --  7.9*  MG  --  1.2*  --  2.1  --    Liver Function Tests:  Recent Labs Lab 12/14/14 0125 12/15/14 0533 12/16/14 0522  AST 109* 110* 99*  ALT 26  23 24  ALKPHOS 216* 198* 217*  BILITOT 13.1* 10.3* 8.0*  PROT 6.5 5.6* 5.8*  ALBUMIN 1.8* 1.4* 1.5*    Recent Labs Lab 12/14/14 0125  LIPASE 23    Recent Labs Lab 12/14/14 0349  AMMONIA 93*   CBC:  Recent Labs Lab 12/14/14 0125 12/15/14 0533 12/16/14 0522  WBC 18.7* 17.6* 21.0*  NEUTROABS 13.3*  --  13.6*  HGB 12.0* 10.6* 10.5*  HCT 33.6* 29.9* 29.6*  MCV 96.6 97.4 99.0  PLT 278 285 294   Cardiac Enzymes:  Recent Labs Lab 12/14/14 0125  TROPONINI <0.03   BNP (last 3 results)  Recent Labs  12/14/14 0125  BNP 250.0*    ProBNP (last 3 results)  Recent Labs  04/25/14 0620  PROBNP 324.5*    CBG: No results for input(s): GLUCAP in the last 168 hours.  No results found for this or any previous  visit (from the past 240 hour(s)).   Studies: US Venous Img Lower Bilateral  12/16/2014   CLINICAL DATA:  Bilateral lower extremity edema, left greater than right. History of smoking. Evaluate for DVT.  EXAM: BILATERAL LOWER EXTREMITY VENOUS DOPPLER ULTRASOUND  TECHNIQUE: Gray-scale sonography with graded compression, as well as color Doppler and duplex ultrasound were performed to evaluate the lower extremity deep venous systems from the level of the common femoral vein and including the common femoral, femoral, profunda femoral, popliteal and calf veins including the posterior tibial, peroneal and gastrocnemius veins when visible. The superficial great saphenous vein was also interrogated. Spectral Doppler was utilized to evaluate flow at rest and with distal augmentation maneuvers in the common femoral, femoral and popliteal veins.  COMPARISON:  None.  FINDINGS: RIGHT LOWER EXTREMITY  Common Femoral Vein: No evidence of thrombus. Normal compressibility, respiratory phasicity and response to augmentation.  Saphenofemoral Junction: No evidence of thrombus. Normal compressibility and flow on color Doppler imaging.  Profunda Femoral Vein: No evidence of thrombus. Normal compressibility and flow on color Doppler imaging.  Femoral Vein: No evidence of thrombus. Normal compressibility, respiratory phasicity and response to augmentation.  Popliteal Vein: No evidence of thrombus. Normal compressibility, respiratory phasicity and response to augmentation.  Calf Veins: No evidence of thrombus. Normal compressibility and flow on color Doppler imaging.  Superficial Great Saphenous Vein: No evidence of thrombus. Normal compressibility and flow on color Doppler imaging.  Venous Reflux:  None.  Other Findings:  None.  LEFT LOWER EXTREMITY  Common Femoral Vein: No evidence of thrombus. Normal compressibility, respiratory phasicity and response to augmentation.  Saphenofemoral Junction: No evidence of thrombus. Normal  compressibility and flow on color Doppler imaging.  Profunda Femoral Vein: No evidence of thrombus. Normal compressibility and flow on color Doppler imaging.  Femoral Vein: No evidence of thrombus. Normal compressibility, respiratory phasicity and response to augmentation.  Popliteal Vein: No evidence of thrombus. Normal compressibility, respiratory phasicity and response to augmentation.  Calf Veins: No evidence of thrombus. Normal compressibility and flow on color Doppler imaging.  Superficial Great Saphenous Vein: No evidence of thrombus. Normal compressibility and flow on color Doppler imaging.  Venous Reflux:  None.  Other Findings:  None.  IMPRESSION: No evidence of DVT within either lower extremity.   Electronically Signed   By: Simonne Come M.D.   On: 12/16/2014 15:05   Dg Chest Port 1 View  12/16/2014   CLINICAL DATA:  Swelling all over.  EXAM: PORTABLE CHEST - 1 VIEW  COMPARISON:  12/14/2014  FINDINGS: Heart is upper limits normal in size.  Mild vascular congestion. No confluent opacities or effusions. No acute bony abnormality.  IMPRESSION: Mild vascular congestion.   Electronically Signed   By: Charlett NoseKevin  Dover M.D.   On: 12/16/2014 15:30    Scheduled Meds: . feeding supplement (ENSURE ENLIVE)  237 mL Oral BID BM  . folic acid  1 mg Oral Daily  . furosemide  20 mg Oral BID  . hydrocortisone   Rectal BID  . ipratropium-albuterol  3 mL Nebulization TID  . lactulose  20 g Oral BID  . LORazepam  0-4 mg Intravenous Q12H  . multivitamin with minerals  1 tablet Oral Daily  . pantoprazole  40 mg Oral QAC breakfast  . prednisoLONE  40 mg Oral Daily  . propranolol  10 mg Oral BID  . rifaximin  550 mg Oral BID  . sodium chloride  3 mL Intravenous Q12H  . spironolactone  25 mg Oral BID  . thiamine  100 mg Oral Daily   Or  . thiamine  100 mg Intravenous Daily   Continuous Infusions:   Time Spent: 30 min   MEMON,JEHANZEB  [facility] Pager [phone]. If 7PM-7AM, please contact  night-coverage at [email or web address], password [email or web address] 12/16/2014, 6:07 PM  LOS: 2 days

## 2014-12-16 NOTE — Clinical Documentation Improvement (Signed)
  Component     Latest Ref Rng 12/14/2014 12/14/2014 12/15/2014 12/16/2014         1:25 AM  3:49 AM    Creatinine     0.50 - 1.35 mg/dL 1.10 1.00 0.66 0.58  EGFR (Non-African Amer.)     >90 mL/min 68 (L) 76 (L) >90 >90   Creatinine has improved by 0.52 mg/dL in 48 hours.  Possible Clinical Condition:   - Acute Kidney Injury, present on admission, resolved  - Dehydration as currently documented  - Other Condition  - Unable to Clinically Determine  Thank You, Erling Conte ,RN Clinical Documentation Specialist:  [phone] [facility]

## 2014-12-17 ENCOUNTER — Telehealth: Payer: Self-pay | Admitting: Gastroenterology

## 2014-12-17 ENCOUNTER — Inpatient Hospital Stay
Admission: RE | Admit: 2014-12-17 | Discharge: 2014-12-26 | Disposition: A | Discharge status: Other Acute Inpatient Hospital | Payer: Medicare Other | Source: Ambulatory Visit | Attending: Internal Medicine | Admitting: Internal Medicine

## 2014-12-17 ENCOUNTER — Encounter: Payer: Self-pay | Admitting: Internal Medicine

## 2014-12-17 LAB — COMPREHENSIVE METABOLIC PANEL
ALBUMIN: 1.5 g/dL — AB (ref 3.5–5.2)
ALK PHOS: 215 U/L — AB (ref 39–117)
ALT: 36 U/L (ref 0–53)
AST: 118 U/L — ABNORMAL HIGH (ref 0–37)
Anion gap: 7 (ref 5–15)
BUN: 16 mg/dL (ref 6–23)
CO2: 34 mmol/L — ABNORMAL HIGH (ref 19–32)
Calcium: 7.9 mg/dL — ABNORMAL LOW (ref 8.4–10.5)
Chloride: 90 mmol/L — ABNORMAL LOW (ref 96–112)
Creatinine, Ser: 0.39 mg/dL — ABNORMAL LOW (ref 0.50–1.35)
GFR calc Af Amer: >90 mL/min (ref >90)
GFR calc non Af Amer: >90 mL/min (ref >90)
Glucose, Bld: 170 mg/dL — ABNORMAL HIGH (ref 70–99)
POTASSIUM: 4.5 mmol/L (ref 3.5–5.1)
SODIUM: 131 mmol/L — AB (ref 135–145)
Total Bilirubin: 5.5 mg/dL — ABNORMAL HIGH (ref 0.3–1.2)
Total Protein: 5.6 g/dL — ABNORMAL LOW (ref 6.0–8.3)

## 2014-12-17 LAB — CBC
HCT: 34.6 % — ABNORMAL LOW (ref 39.0–52.0)
Hemoglobin: 11.9 g/dL — ABNORMAL LOW (ref 13.0–17.0)
MCH: 34.6 pg — AB (ref 26.0–34.0)
MCHC: 34.4 g/dL (ref 30.0–36.0)
MCV: 100.6 fL — ABNORMAL HIGH (ref 78.0–100.0)
PLATELETS: 326 10*3/uL (ref 150–400)
RBC: 3.44 MIL/uL — ABNORMAL LOW (ref 4.22–5.81)
RDW: 19.6 % — AB (ref 11.5–15.5)
WBC: 21.8 10*3/uL — ABNORMAL HIGH (ref 4.0–10.5)

## 2014-12-17 LAB — PROTIME-INR
INR: 1.61 — ABNORMAL HIGH (ref 0.00–1.49)
Prothrombin Time: 19.3 seconds — ABNORMAL HIGH (ref 11.6–15.2)

## 2014-12-17 IMAGING — US US EXTREM LOW VENOUS BILAT
1 series · 13 of 24 positions shown · non-contrast
Comparison: None.

CLINICAL DATA: Bilateral lower extremity edema, left greater than
right. History of smoking. Evaluate for DVT.



[Series 1: us extrem low venous bilat · 0.07mm/px · 13 of 67 slices shown]
[im 1/67]
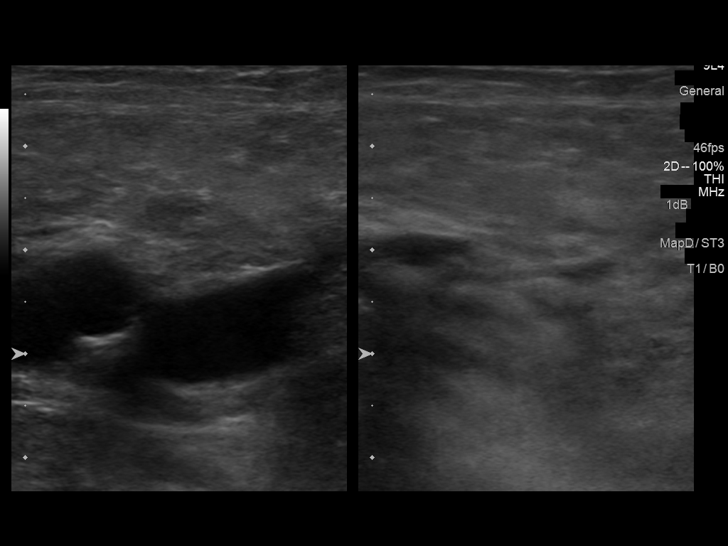
[im 6/67]
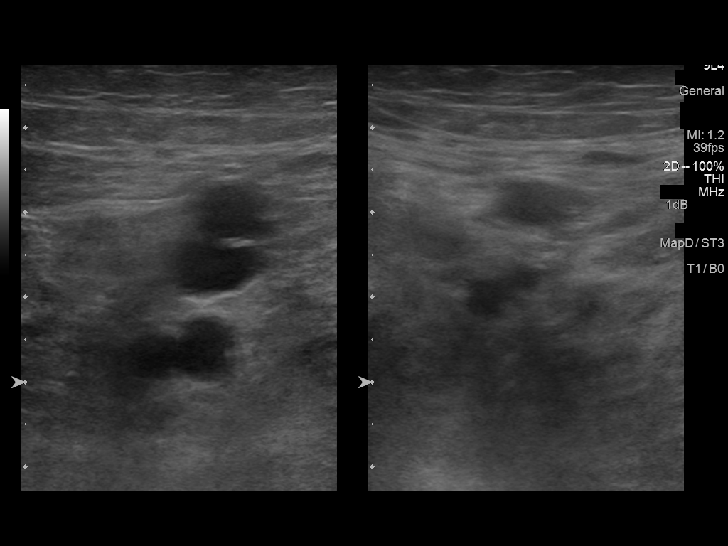
[im 12/67]
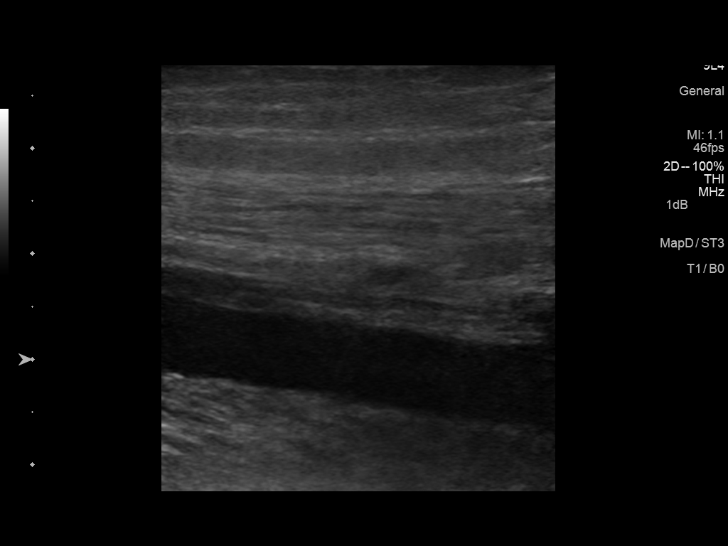
[im 18/67]
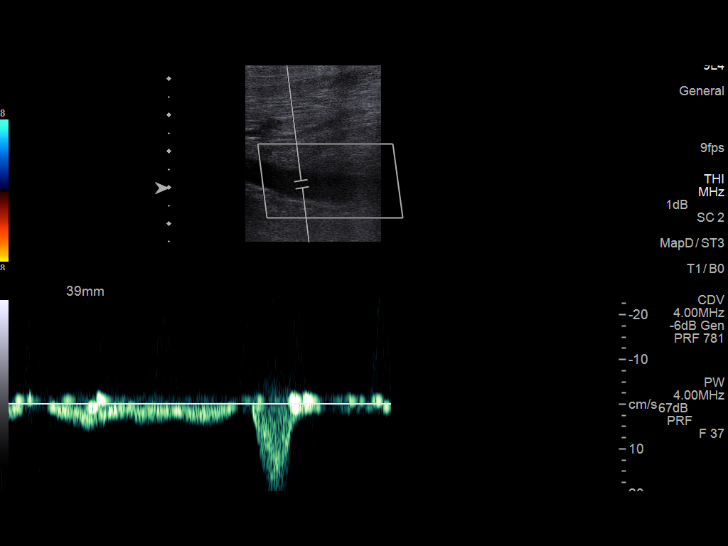
[im 23/67]
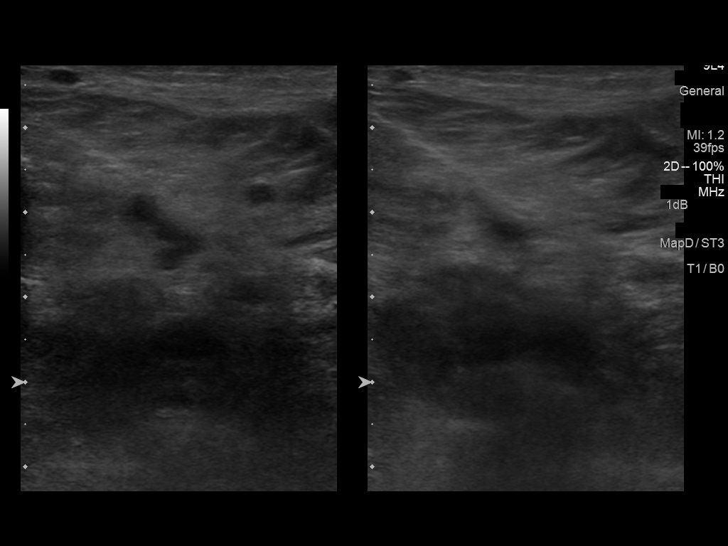
[im 29/67]
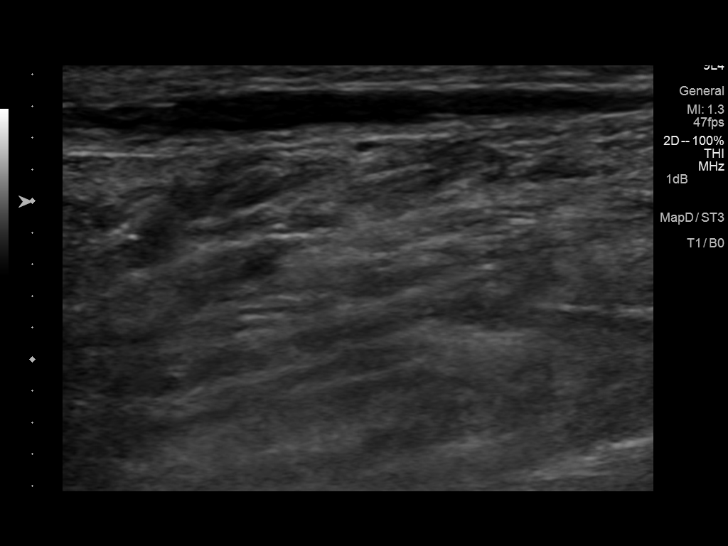
[im 35/67]
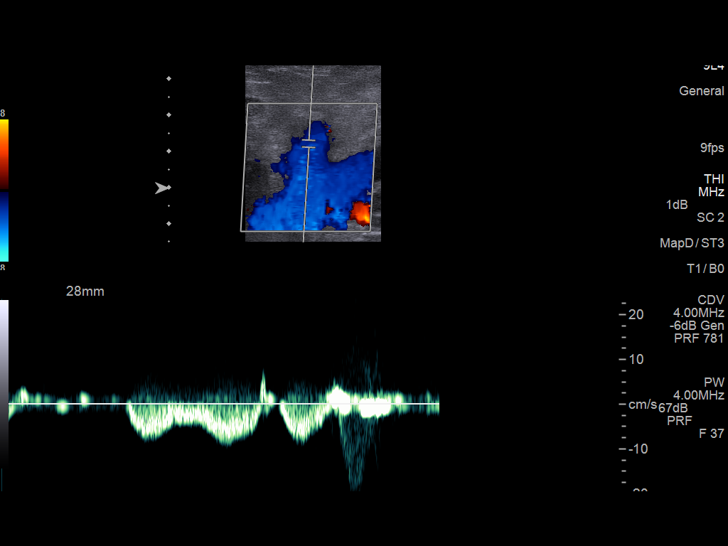
[im 38/67]
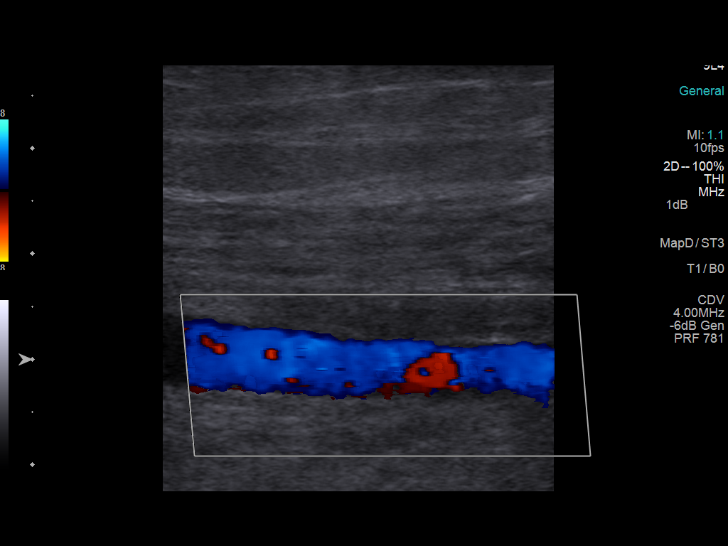
[im 44/67]
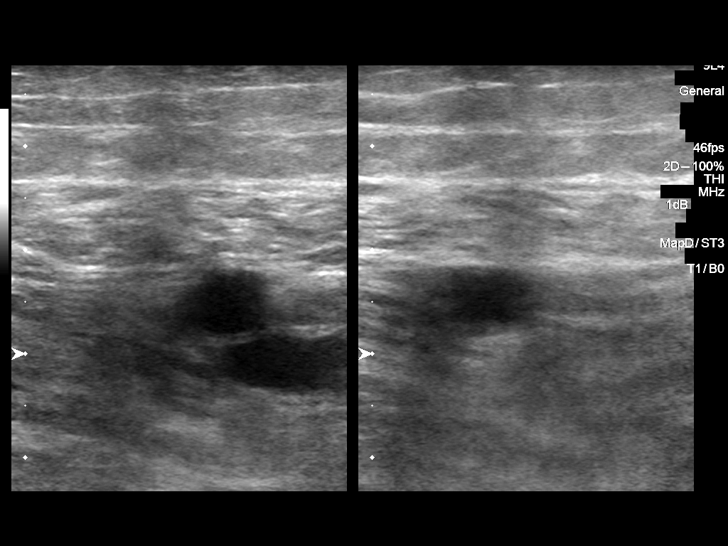
[im 49/67]
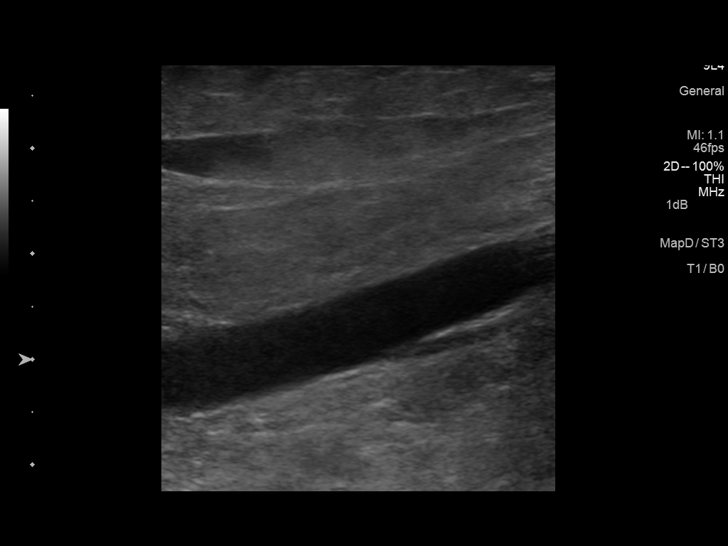
[im 55/67]
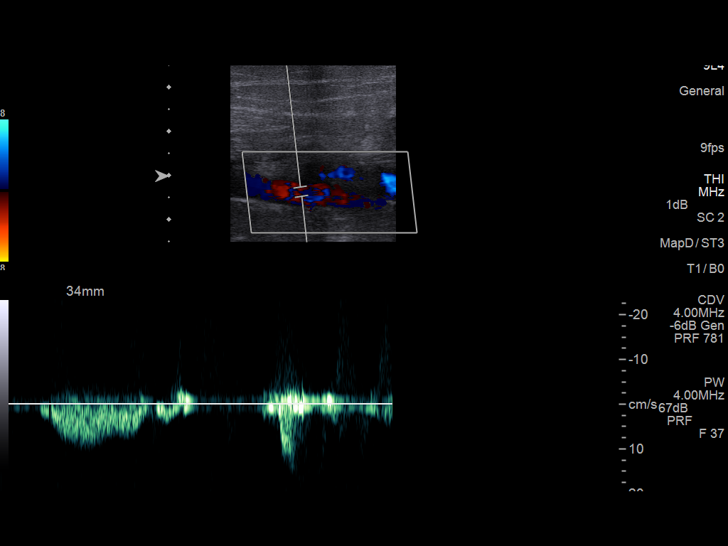
[im 61/67]
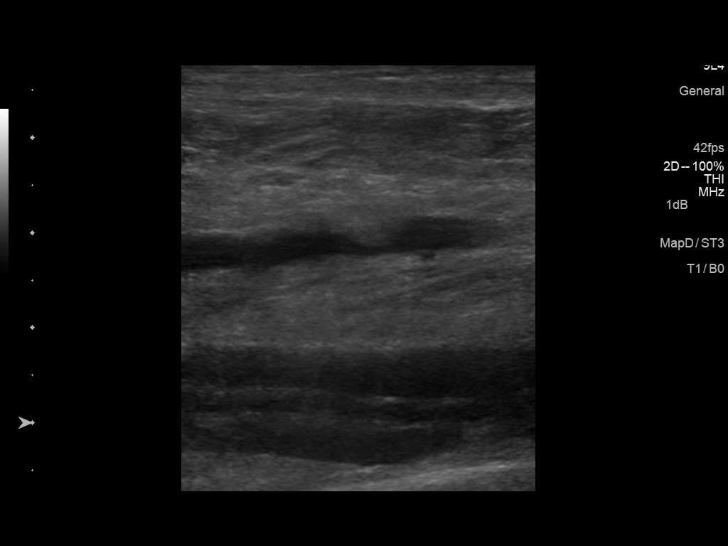
[im 67/67]
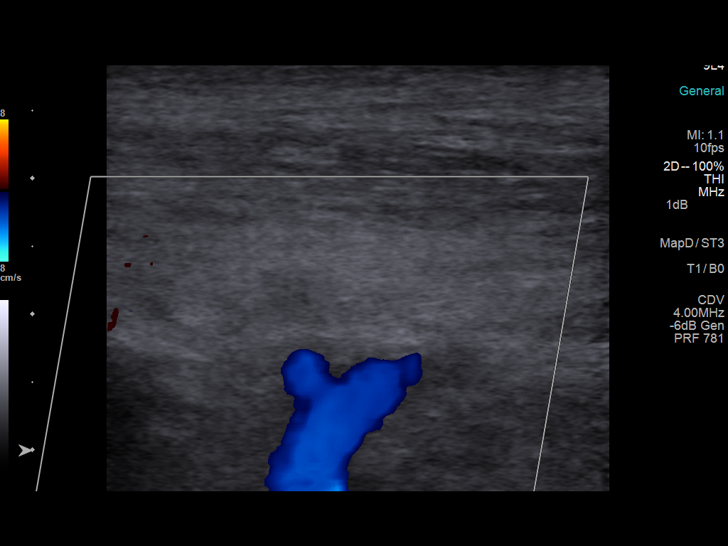

[13 of 24 positions shown; findings below may reference images not displayed]

FINDINGS: RIGHT LOWER EXTREMITY

Common Femoral Vein: No evidence of thrombus. Normal
compressibility, respiratory phasicity and response to augmentation.

Saphenofemoral Junction: No evidence of thrombus. Normal
compressibility and flow on color Doppler imaging.

Profunda Femoral Vein: No evidence of thrombus. Normal
compressibility and flow on color Doppler imaging.

Femoral Vein: No evidence of thrombus. Normal compressibility,
respiratory phasicity and response to augmentation.

Popliteal Vein: No evidence of thrombus. Normal compressibility,
respiratory phasicity and response to augmentation.

Calf Veins: No evidence of thrombus. Normal compressibility and flow
on color Doppler imaging.

Superficial Great Saphenous Vein: No evidence of thrombus. Normal
compressibility and flow on color Doppler imaging.

Venous Reflux:  None.

Other Findings:  None.

LEFT LOWER EXTREMITY

Common Femoral Vein: No evidence of thrombus. Normal
compressibility, respiratory phasicity and response to augmentation.

Saphenofemoral Junction: No evidence of thrombus. Normal
compressibility and flow on color Doppler imaging.

Profunda Femoral Vein: No evidence of thrombus. Normal
compressibility and flow on color Doppler imaging.

Femoral Vein: No evidence of thrombus. Normal compressibility,
respiratory phasicity and response to augmentation.

Popliteal Vein: No evidence of thrombus. Normal compressibility,
respiratory phasicity and response to augmentation.

Calf Veins: No evidence of thrombus. Normal compressibility and flow
on color Doppler imaging.

Superficial Great Saphenous Vein: No evidence of thrombus. Normal
compressibility and flow on color Doppler imaging.

Venous Reflux:  None.

Other Findings:  None.
IMPRESSION: No evidence of DVT within either lower extremity.

## 2014-12-17 MED ORDER — FUROSEMIDE 20 MG PO TABS: 20.0000 mg | ORAL_TABLET | Freq: Two times a day (BID) | ORAL | Status: DC

## 2014-12-17 MED ORDER — ENSURE ENLIVE PO LIQD: 237.0000 mL | Freq: Two times a day (BID) | ORAL | Status: DC

## 2014-12-17 MED ORDER — HYDROCORTISONE 2.5 % RE CREA: TOPICAL_CREAM | Freq: Two times a day (BID) | RECTAL | Status: DC

## 2014-12-17 MED ORDER — PREDNISOLONE 15 MG/5ML PO SOLN: 40.0000 mg | Freq: Every day | ORAL | Status: DC

## 2014-12-17 MED ORDER — FUROSEMIDE 10 MG/ML IJ SOLN: 40.0000 mg | Freq: Once | INTRAMUSCULAR | Status: DC

## 2014-12-17 MED ORDER — LACTULOSE 10 GM/15ML PO SOLN: 20.0000 g | Freq: Two times a day (BID) | ORAL | Status: DC

## 2014-12-17 MED ORDER — PANTOPRAZOLE SODIUM 40 MG PO TBEC: 40.0000 mg | DELAYED_RELEASE_TABLET | Freq: Every day | ORAL | Status: DC

## 2014-12-17 MED ORDER — FOLIC ACID 1 MG PO TABS: 1.0000 mg | ORAL_TABLET | Freq: Every day | ORAL | Status: DC

## 2014-12-17 MED ORDER — IPRATROPIUM-ALBUTEROL 0.5-2.5 (3) MG/3ML IN SOLN: 3.0000 mL | Freq: Three times a day (TID) | RESPIRATORY_TRACT | Status: DC

## 2014-12-17 MED ORDER — PROPRANOLOL HCL 10 MG PO TABS: 10.0000 mg | ORAL_TABLET | Freq: Two times a day (BID) | ORAL | Status: DC

## 2014-12-17 MED ORDER — SPIRONOLACTONE 25 MG PO TABS: 25.0000 mg | ORAL_TABLET | Freq: Two times a day (BID) | ORAL | Status: DC

## 2014-12-17 NOTE — Progress Notes (Signed)
CSW provided patient and his brother with SNF bed offers- they have selected [facility]. Awaiting MD for dc summary and orders for transfer- anticipate today per rounds with MD.  Reece LevyJanet Rayan Ines, MSW, Theresia MajorsLCSWA [phone]

## 2014-12-17 NOTE — Progress Notes (Signed)
Subjective: Alert and conversant. Asking to wear depends. Tolerating diet. Per Misty Stanley, RN, several formed bowel movements. No further diarrhea.    Objective: Vital signs in last 24 hours: Temp:  [97.6 F (36.4 C)-98 F (36.7 C)] 98 F (36.7 C) (04/21 0634) Pulse Rate:  [67-71] 70 (04/21 0634) Resp:  [17-20] 20 (04/21 0634) BP: (115-164)/(73-85) 164/85 mmHg (04/21 0634) SpO2:  [95 %-98 %] 95 % (04/21 0732) Weight:  [186 lb 1.1 oz (84.4 kg)] 186 lb 1.1 oz (84.4 kg) (04/21 0634) Last BM Date: 12/16/14 General:   Alert and oriented, pleasant Eyes:  Mild scleral icterus  Mouth:  Without lesions, mucosa pink and moist.  Abdomen:  Bowel sounds present, obese and round, umbilical hernia, hepatomegaly noted, no TTP.  Extremities:  Trace pedal edema Neurologic:  Alert and  oriented to person, place, situation Skin:  Jaundice improved   Intake/Output from previous day: 04/20 0701 - 04/21 0700 In: 720 [P.O.:720] Out: 4400 [Urine:4400] Intake/Output this shift:    Lab Results:  Recent Labs  12/15/14 0533 12/16/14 0522  WBC 17.6* 21.0*  HGB 10.6* 10.5*  HCT 29.9* 29.6*  PLT 285 294   BMET  Recent Labs  12/15/14 0533 12/16/14 0522 12/17/14 0544  NA 131* 131* 131*  K 3.3* 3.7 4.5  CL 92* 91* 90*  CO2 29 30 34*  GLUCOSE 110* 162* 170*  BUN CREATININE 0.66 0.58 0.39*  CALCIUM 7.7* 7.9* 7.9*   LFT  Recent Labs  12/15/14 0533 12/16/14 0522 12/17/14 0544  PROT 5.6* 5.8* 5.6*  ALBUMIN 1.4* 1.5* 1.5*  AST 110* 99* 118*  ALT 23 24 36  ALKPHOS 198* 217* 215*  BILITOT 10.3* 8.0* 5.5*   PT/INR  Recent Labs  12/15/14 0954 12/16/14 0522  LABPROT 20.7* 21.8*  INR 1.76* 1.88*   Hepatitis Panel  Recent Labs  12/14/14 1017  HEPBSAG NEGATIVE  HCVAB NEGATIVE  HEPAIGM NON REACTIVE  HEPBIGM NON REACTIVE     Studies/Results: US Venous Img Lower Bilateral  12/16/2014   CLINICAL DATA:  Bilateral lower extremity edema, left greater than right.  History of smoking. Evaluate for DVT.  EXAM: BILATERAL LOWER EXTREMITY VENOUS DOPPLER ULTRASOUND  TECHNIQUE: Gray-scale sonography with graded compression, as well as color Doppler and duplex ultrasound were performed to evaluate the lower extremity deep venous systems from the level of the common femoral vein and including the common femoral, femoral, profunda femoral, popliteal and calf veins including the posterior tibial, peroneal and gastrocnemius veins when visible. The superficial great saphenous vein was also interrogated. Spectral Doppler was utilized to evaluate flow at rest and with distal augmentation maneuvers in the common femoral, femoral and popliteal veins.  COMPARISON:  None.  FINDINGS: RIGHT LOWER EXTREMITY  Common Femoral Vein: No evidence of thrombus. Normal compressibility, respiratory phasicity and response to augmentation.  Saphenofemoral Junction: No evidence of thrombus. Normal compressibility and flow on color Doppler imaging.  Profunda Femoral Vein: No evidence of thrombus. Normal compressibility and flow on color Doppler imaging.  Femoral Vein: No evidence of thrombus. Normal compressibility, respiratory phasicity and response to augmentation.  Popliteal Vein: No evidence of thrombus. Normal compressibility, respiratory phasicity and response to augmentation.  Calf Veins: No evidence of thrombus. Normal compressibility and flow on color Doppler imaging.  Superficial Great Saphenous Vein: No evidence of thrombus. Normal compressibility and flow on color Doppler imaging.  Venous Reflux:  None.  Other Findings:  None.  LEFT LOWER EXTREMITY  Common Femoral Vein:  No evidence of thrombus. Normal compressibility, respiratory phasicity and response to augmentation.  Saphenofemoral Junction: No evidence of thrombus. Normal compressibility and flow on color Doppler imaging.  Profunda Femoral Vein: No evidence of thrombus. Normal compressibility and flow on color Doppler imaging.  Femoral Vein: No  evidence of thrombus. Normal compressibility, respiratory phasicity and response to augmentation.  Popliteal Vein: No evidence of thrombus. Normal compressibility, respiratory phasicity and response to augmentation.  Calf Veins: No evidence of thrombus. Normal compressibility and flow on color Doppler imaging.  Superficial Great Saphenous Vein: No evidence of thrombus. Normal compressibility and flow on color Doppler imaging.  Venous Reflux:  None.  Other Findings:  None.  IMPRESSION: No evidence of DVT within either lower extremity.   Electronically Signed   By: Simonne ComeJohn  Watts M.D.   On: 12/16/2014 15:05   Dg Chest Port 1 View  12/16/2014   CLINICAL DATA:  Swelling all over.  EXAM: PORTABLE CHEST - 1 VIEW  COMPARISON:  12/14/2014  FINDINGS: Heart is upper limits normal in size. Mild vascular congestion. No confluent opacities or effusions. No acute bony abnormality.  IMPRESSION: Mild vascular congestion.   Electronically Signed   By: Charlett NoseKevin  Dover M.D.   On: 12/16/2014 15:30    Assessment: 64 year old male admitted with alcoholic hepatitis, US abdomen with likely cirrhosis. Negative viral markers, no signs/symptoms of infection. With elevated discriminant function, prednisolone started. Slight worsening leukocytosis. ?related to prednisolone. Continue to monitor. Doubt SBP, with limited ascites present. Recheck CBC today. Overall bilirubin continues to improve.   Rectal bleeding this admission: likely benign anorectal source. No prior colonoscopy. Does not appear hemodynamically significant. Continue Anusol and plan on outpatient colonoscopy.   Plan: Prednisolone 40 mg daily for 30 days, then rapid taper over 2 weeks Anusol cream per rectum BID X 7 days Continue PPI Lactulose titrated for 3 soft BMs daily, Xifaxan BID Repeat INR and CBC today  HFP/INR in am Elastography in several months after acute episode resolved Outpatient evaluation for TCS/EGD ETOH cessation   Nira RetortAnna W. Sams,  ANP-BC [facility]    LOS: 3 days    12/17/2014, 7:52 AM

## 2014-12-17 NOTE — Clinical Social Work Placement (Signed)
   CLINICAL SOCIAL WORK PLACEMENT  NOTE  Date:  12/17/2014  Patient Details  Name: Billy Stone MRN: [id number] Date of Birth: 12-Apr-1951  Clinical Social Work is seeking post-discharge placement for this patient at the Skilled  Nursing Facility level of care (*CSW will initial, date and re-position this form in  chart as items are completed):  Yes   Patient/family provided with [facility] Clinical Social Work Department's list of facilities offering this level of care within the geographic area requested by the patient (or if unable, by the patient's family).  Yes   Patient/family informed of their freedom to choose among providers that offer the needed level of care, that participate in Medicare, Medicaid or managed care program needed by the patient, have an available bed and are willing to accept the patient.  Yes   Patient/family informed of [facility]'s ownership interest in [facility] and [facility], as well as of the fact that they are under no obligation to receive care at these facilities.  PASRR submitted to EDS on 12/16/14     PASRR number received on 12/16/14     Existing PASRR number confirmed on       FL2 transmitted to all facilities in geographic area requested by pt/family on 12/16/14     FL2 transmitted to all facilities within larger geographic area on       Patient informed that his/her managed care company has contracts with or will negotiate with certain facilities, including the following:        Yes   Patient/family informed of bed offers received.  Patient [facility] bed [facility]     Physician recommends and patient chooses bed at      [facility] on 12[facility]17/14.  Patient to be transferred to facility by RN     Patient family notified on 12/17/14 of transfer.  Name of family member notified:  brother- Jimmy     PHYSICIAN       Additional Comment:     _______________________________________________ Liliana Clinealdwell, Danni Shima Parks, LCSW 12/17/2014, 1:22 PM

## 2014-12-17 NOTE — Progress Notes (Signed)
Shon BatonLisa Convington RN called and gave report to [facility]. IV and tele removed. Pt is stable. [facility] now

## 2014-12-17 NOTE — Discharge Summary (Signed)
Physician Discharge Summary  Billy Stone VWU:[id number] DOB: 1951-02-25 DOA: 12/14/2014  PCP: Catalina Pizza, MD  Admit date: 12/14/2014 Discharge date: 12/17/2014  Time spent: 45 minutes  Recommendations for Outpatient Follow-up:  1. Patient will discharge to [facility] for physical therapy 2. Repeat CBC and CMP in 1 week to follow leukocytosis, renal function and LFTs 3. Follow up with GI as an outpatient to consider EGD and colonoscopy  Discharge Diagnoses:  Principal Problem:   Hypokalemia Active Problems:   Alcohol abuse   Essential hypertension   CAD (coronary artery disease), native coronary artery   Cirrhosis of liver   Weakness generalized   Jaundice   Ascites   Hepatitis   Severe protein-calorie malnutrition   Acute liver failure   Hyperbilirubinemia   Hyponatremia   Alcoholic hepatitis with ascites   Discharge Condition: improving  Diet recommendation: low salt  Filed Weights   12/15/14 0643 12/16/14 0631 12/17/14 0634  Weight: 85.231 kg (187 lb 14.4 oz) 84.6 kg (186 lb 8.2 oz) 84.4 kg (186 lb 1.1 oz)    History of present illness:  This patient was admitted to the hospital with progressive generalized weakness, generalized edema and jaundice. He had seen his primary care physician prior to arrival and was told to decrease his alcohol intake. The patient does have a long-standing history of alcohol abuse. 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 He was evaluated in the emergency room and noted to be in decompensated liver failure. He was admitted for further treatments.  Hospital Course:  Patient was seen by gastroenterology. He was felt to have alcoholic hepatitis and was started on prednisolone. He was monitored in the hospital and gradually his LFTs have started to improve. He will need to be further followed in the outpatient setting. Ultrasound of the abdomen indicated cirrhosis without any significant  ascites.   The patient did have anasarca which is felt to be related to his hypoalbuminemia with an albumin of 1.4. He was started on low-dose Lasix and spironolactone. Urine output has been adequate. His overall edema is improving. He is not requiring any oxygen at this time.   He was noted to have a significant leukocytosis without any clear infectious process. Urinalysis was not convincing of infection and chest x-ray did not show any pneumonia. He's been afebrile and does not appear toxic. His leukocytosis may be related to steroid use. This will need to be further followed in the outpatient setting.   He was started on lactulose since he was noted to have an elevated ammonia of 93 on admission. His mental status has since improved and he is having frequent bowel movements. He'll be continued on lactulose. GI service will try to arrange for Xifaxan as an outpatient since cost appears to be prohibitive at this time. Patient was seen by physical therapy who recommended skilled nursing facility placement. He'll be discharged there today for further therapies.  Procedures:  [place] abd: cirrhosis no ascities  Consultations:  gastroenterology  Discharge Exam: Filed Vitals:   12/17/14 0634  BP: 164/85  Pulse: 70  Temp: 98 F (36.7 C)  Resp: 20    General: NAD Cardiovascular: S1, s2 RRR Respiratory: CTA B  Discharge Instructions   Discharge Instructions    Diet - low sodium heart healthy    Complete by:  As directed      Increase activity slowly    Complete by:  As directed  Current Discharge Medication List    START taking these medications   Details  feeding supplement, ENSURE ENLIVE, (ENSURE ENLIVE) LIQD Take 237 mLs by mouth 2 (two) times daily between meals. Qty: 237 mL, Refills: 12    folic acid (FOLVITE) 1 MG tablet Take 1 tablet (1 mg total) by mouth daily.    furosemide (LASIX) 20 MG tablet Take 1 tablet (20 mg total) by mouth 2 (two) times daily. Qty: 30  tablet    hydrocortisone (ANUSOL-HC) 2.5 % rectal cream Place rectally 2 (two) times daily. Qty: 30 g, Refills: 0    ipratropium-albuterol (DUONEB) 0.5-2.5 (3) MG/3ML SOLN Take 3 mLs by nebulization 3 (three) times daily. Qty: 360 mL    lactulose (CHRONULAC) 10 GM/15ML solution Take 30 mLs (20 g total) by mouth 2 (two) times daily. Qty: 240 mL, Refills: 0    pantoprazole (PROTONIX) 40 MG tablet Take 1 tablet (40 mg total) by mouth daily before breakfast.    prednisoLONE (PRELONE) 15 MG/5ML SOLN Take 13.3 mLs (40 mg total) by mouth daily. Until 5/19 Qty: 450 mL    propranolol (INDERAL) 10 MG tablet Take 1 tablet (10 mg total) by mouth 2 (two) times daily.    spironolactone (ALDACTONE) 25 MG tablet Take 1 tablet (25 mg total) by mouth 2 (two) times daily.      CONTINUE these medications which have NOT CHANGED   Details  albuterol (PROVENTIL HFA;VENTOLIN HFA) 108 (90 BASE) MCG/ACT inhaler Inhale 2 puffs into the lungs every 6 (six) hours as needed for wheezing or shortness of breath. Qty: 1 Inhaler, Refills: 2    aspirin 81 MG tablet Take 81 mg by mouth daily.    B Complex-C (SUPER B COMPLEX PO) Take 1 tablet by mouth daily.    chlorpheniramine (EQ CHLORTABS) 4 MG tablet Take 4 mg by mouth every 4 (four) hours as needed for allergies.    fluticasone (FLONASE) 50 MCG/ACT nasal spray Place 1 spray into both nostrils daily. Qty: 16 g, Refills: 2    nitroGLYCERIN (NITROSTAT) 0.4 MG SL tablet Place 1 tablet (0.4 mg total) under the tongue every 5 (five) minutes as needed for chest pain. Qty: 25 tablet, Refills: 3    Omega-3 Fatty Acids (FISH OIL) 1000 MG CAPS Take by mouth. 2-3 tabs per day      STOP taking these medications     docusate sodium 100 MG CAPS      metoprolol tartrate (LOPRESSOR) 50 MG tablet      sodium chloride 1 G tablet        Allergies  Allergen Reactions  . Amlodipine Besylate     REACTION: Feet swell  . Penicillins Other (See Comments)    Childhood  allergy      The results of significant diagnostics from this hospitalization (including imaging, microbiology, ancillary and laboratory) are listed below for reference.    Significant Diagnostic Studies: [place] Abdomen Complete  12/14/2014   CLINICAL DATA:  Ascites for 1 week.  EXAM: ULTRASOUND ABDOMEN COMPLETE  COMPARISON:  None.  FINDINGS: Gallbladder: The gallbladder wall appears thickened measuring 9.8 mm. Sludge is identified within the lumen of the gallbladder. No sonographic Murphy's sign.  Common bile duct: Diameter: 4.3 mm.  Liver: The liver appears diffusely heterogeneous and echogenic with a nodular contour.  IVC: No abnormality visualized.  Pancreas: Not visualized  Spleen: Not visualized due to overlying bowel gas.  Right Kidney: Length: 12.2 cm. Echogenicity within normal limits. No mass or hydronephrosis visualized.  Left Kidney: Length: 13 cm. Echogenicity within normal limits. No mass or hydronephrosis visualized.  Abdominal aorta: Not visualized due to overlying bowel gas.  Other findings: Trace ascites noted.  IMPRESSION: 1. Morphologic features of the liver which are suggestive of cirrhosis. 2. Gallbladder wall thickening and gallbladder sludge. In the setting of ascites and cirrhosis the gallbladder wall thickening may be a nonspecific finding. Correlate for any clinical signs or symptoms of cholecystitis.   Electronically Signed   By: Signa Kell M.D.   On: 12/14/2014 09:05   US Venous Img Lower Bilateral  12/16/2014   CLINICAL DATA:  Bilateral lower extremity edema, left greater than right. History of smoking. Evaluate for DVT.  EXAM: BILATERAL LOWER EXTREMITY VENOUS DOPPLER ULTRASOUND  TECHNIQUE: Gray-scale sonography with graded compression, as well as color Doppler and duplex ultrasound were performed to evaluate the lower extremity deep venous systems from the level of the common femoral vein and including the common femoral, femoral, profunda femoral, popliteal and calf veins  including the posterior tibial, peroneal and gastrocnemius veins when visible. The superficial great saphenous vein was also interrogated. Spectral Doppler was utilized to evaluate flow at rest and with distal augmentation maneuvers in the common femoral, femoral and popliteal veins.  COMPARISON:  None.  FINDINGS: RIGHT LOWER EXTREMITY  Common Femoral Vein: No evidence of thrombus. Normal compressibility, respiratory phasicity and response to augmentation.  Saphenofemoral Junction: No evidence of thrombus. Normal compressibility and flow on color Doppler imaging.  Profunda Femoral Vein: No evidence of thrombus. Normal compressibility and flow on color Doppler imaging.  Femoral Vein: No evidence of thrombus. Normal compressibility, respiratory phasicity and response to augmentation.  Popliteal Vein: No evidence of thrombus. Normal compressibility, respiratory phasicity and response to augmentation.  Calf Veins: No evidence of thrombus. Normal compressibility and flow on color Doppler imaging.  Superficial Great Saphenous Vein: No evidence of thrombus. Normal compressibility and flow on color Doppler imaging.  Venous Reflux:  None.  Other Findings:  None.  LEFT LOWER EXTREMITY  Common Femoral Vein: No evidence of thrombus. Normal compressibility, respiratory phasicity and response to augmentation.  Saphenofemoral Junction: No evidence of thrombus. Normal compressibility and flow on color Doppler imaging.  Profunda Femoral Vein: No evidence of thrombus. Normal compressibility and flow on color Doppler imaging.  Femoral Vein: No evidence of thrombus. Normal compressibility, respiratory phasicity and response to augmentation.  Popliteal Vein: No evidence of thrombus. Normal compressibility, respiratory phasicity and response to augmentation.  Calf Veins: No evidence of thrombus. Normal compressibility and flow on color Doppler imaging.  Superficial Great Saphenous Vein: No evidence of thrombus. Normal compressibility and  flow on color Doppler imaging.  Venous Reflux:  None.  Other Findings:  None.  IMPRESSION: No evidence of DVT within either lower extremity.   Electronically Signed   By: Simonne Come M.D.   On: 12/16/2014 15:05   Dg Chest Port 1 View  12/16/2014   CLINICAL DATA:  Swelling all over.  EXAM: PORTABLE CHEST - 1 VIEW  COMPARISON:  12/14/2014  FINDINGS: Heart is upper limits normal in size. Mild vascular congestion. No confluent opacities or effusions. No acute bony abnormality.  IMPRESSION: Mild vascular congestion.   Electronically Signed   By: Charlett Nose M.D.   On: 12/16/2014 15:30   Dg Chest Portable 1 View  12/14/2014   CLINICAL DATA:  Initial evaluation for acute weakness, fatigue.  EXAM: PORTABLE CHEST - 1 VIEW  COMPARISON:  Prior radiograph from 04/25/2014  FINDINGS: The cardiac and  mediastinal silhouettes are stable in size and contour, and remain within normal limits.  The lungs are hypoinflated. No airspace consolidation, pleural effusion, or pulmonary edema is identified. There is no pneumothorax.  Remote right-sided rib fractures again noted. No acute osseous abnormality.  IMPRESSION: Shallow lung inflation with no acute cardiopulmonary abnormality identified.   Electronically Signed   By: Rise Mu M.D.   On: 12/14/2014 02:13    Microbiology: No results found for this or any previous visit (from the past 240 hour(s)).   Labs: Basic Metabolic Panel:  Recent Labs Lab 12/14/14 0125 12/14/14 0349 12/15/14 0533 12/15/14 0954 12/16/14 0522 12/17/14 0544  NA 127* 128* 131*  --  131* 131*  K 2.6* 2.8* 3.3*  --  3.7 4.5  CL 86* 88* 92*  --  91* 90*  CO2 27 25 29   --  30 34*  GLUCOSE 112* 112* 110*  --  162* 170*  BUN 16 16 12   --  11 16  CREATININE 1.10 1.00 0.66  --  0.58 0.39*  CALCIUM 7.6* 7.5* 7.7*  --  7.9* 7.9*  MG  --  1.2*  --  2.1  --   --    Liver Function Tests:  Recent Labs Lab 12/14/14 0125 12/15/14 0533 12/16/14 0522 12/17/14 0544  AST 109* 110* 99*  118*  ALT 26 23 24  36  ALKPHOS 216* 198* 217* 215*  BILITOT 13.1* 10.3* 8.0* 5.5*  PROT 6.5 5.6* 5.8* 5.6*  ALBUMIN 1.8* 1.4* 1.5* 1.5*    Recent Labs Lab 12/14/14 0125  LIPASE 23    Recent Labs Lab 12/14/14 0349  AMMONIA 93*   CBC:  Recent Labs Lab 12/14/14 0125 12/15/14 0533 12/16/14 0522 12/17/14 1038  WBC 18.7* 17.6* 21.0* 21.8*  NEUTROABS 13.3*  --  13.6*  --   HGB 12.0* 10.6* 10.5* 11.9*  HCT 33.6* 29.9* 29.6* 34.6*  MCV 96.6 97.4 99.0 100.6*  PLT 278 285 294 326   Cardiac Enzymes:  Recent Labs Lab 12/14/14 0125  TROPONINI <0.03   BNP: BNP (last 3 results)  Recent Labs  12/14/14 0125  BNP 250.0*    ProBNP (last 3 results)  Recent Labs  04/25/14 0620  PROBNP 324.5*    CBG: No results for input(s): GLUCAP in the last 168 hours.     Signed:  MEMON,JEHANZEB  [facility] 12/17/2014, 12:04 PM

## 2014-12-17 NOTE — Evaluation (Signed)
Occupational Therapy Evaluation Patient Details Name: Billy BolkWalter L Buday MRN: [id number] DOB: 1950/10/01 Today's Date: 12/17/2014    History of Present Illness 64 yo male comes in with over a week of progressive weakness and swelling all over, also turning yellow. Pt states he went to see dr hall 2 weeks ago, had a bunch of blood work done but has not heard back from his office, he was told by dr hall that he needed to cut back on his drinking etoh to only 4 beers a day which pt has been doing. Has longstanding h/o etoh abuse. Pt is not aware of any liver disease, and is not aware of anyone telling him he has needed to stop drinking except for dr hall 2 weeks ago. He denies any pain. No n/v/d. He has prostate issues, so i/o caths himself at home chronically. Pt is jaundiced, with decompensated liver failure at this time although his vitals are normal has low potassium level.   Clinical Impression   PTA pt lived with brother and was independent in all B/IADLs. Pt is awake, alert, and oriented x3 (person, place, situation). Pt demonstrates some confusion and is tangential at times during evaluation.  Pt demonstrates good range of motion, has some weakness in BUE, 4/5. Pt now requires min-mod assistance with ADL tasks due to weakness and decreased activity tolerance; pt also has SOB upon exertion. Pt uses a cane for functional mobility as well as wall walks in the house. Pt and brother report having a tub bench, however did not understand how it worked. Educated pt and brother on set-up and use of tub bench. Discussed rehab at SNF with pt, who continues to be agreeable with this plan. Recommend OT evaluation at SNF. No further acute OT services required.     Follow Up Recommendations  SNF;Supervision/Assistance - 24 hour    Equipment Recommendations  3 in 1 bedside comode       Precautions / Restrictions Precautions Precautions: Fall Restrictions Weight Bearing Restrictions: No              ADL Overall ADL's : Needs assistance/impaired Eating/Feeding: Set up                   Lower Body Dressing: Minimal assistance                 General ADL Comments: Pt reports he has needed some assistance with dressing and bathing within the past couple of weeks due to feeling weak and falling     Vision Vision Assessment?: Yes Eye Alignment: Within Functional Limits Ocular Range of Motion: Within Functional Limits Alignment/Gaze Preference: Within Defined Limits Tracking/Visual Pursuits: Decreased smoothness of horizontal tracking Saccades: Within functional limits Convergence: Within functional limits Visual Fields: No apparent deficits          Pertinent Vitals/Pain Pain Assessment: No/denies pain     Hand Dominance Right   Extremity/Trunk Assessment Upper Extremity Assessment Upper Extremity Assessment: Generalized weakness   Lower Extremity Assessment Lower Extremity Assessment: Defer to PT evaluation       Communication Communication Communication: No difficulties   Cognition Arousal/Alertness: Awake/alert Behavior During Therapy: WFL for tasks assessed/performed Overall Cognitive Status: History of cognitive impairments - at baseline       Memory: Decreased short-term memory                        Home Living Family/patient expects to be discharged to:: Skilled nursing facility Living Arrangements:  Other relatives (Brother)                                      Prior Functioning/Environment Level of Independence: Independent with assistive device(s)        Comments: uses a cane for gait    OT Diagnosis: Generalized weakness;Cognitive deficits   OT Problem List: Decreased strength;Decreased activity tolerance;Decreased cognition;Decreased safety awareness;Decreased knowledge of use of DME or AE    End of Session    Activity Tolerance: Patient tolerated treatment well Patient left: in bed;with call  bell/phone within reach;with bed alarm set;with family/visitor present   Time: 8119-1478 OT Time Calculation (min): 23 min Charges:  OT General Charges $OT Visit: 1 Procedure OT Evaluation $Initial OT Evaluation Tier I: 1 Procedure  Ezra Sites, OTR/L  [phone]  12/17/2014, 8:48 AM

## 2014-12-17 NOTE — Progress Notes (Addendum)
Report given to Torrie Mayers. Miley, LPN at [facility].

## 2014-12-17 NOTE — Telephone Encounter (Signed)
APPOINTMENT MADE AND LETTER SENT °

## 2014-12-17 NOTE — Progress Notes (Signed)
Patient for d/c today to SNF bed at [facility]. Family and patient agreeable to this plan- RN to call report and transfer patient. Reece LevyJanet Jette Lewan, MSW, Theresia MajorsLCSWA [phone]

## 2014-12-17 NOTE — Telephone Encounter (Signed)
Please have patient return in 4 weeks for follow-up of ETOH hepatitis and discuss EGD and colonoscopy.

## 2014-12-18 ENCOUNTER — Encounter (HOSPITAL_COMMUNITY)
Admission: AD | Admit: 2014-12-18 | Discharge: 2014-12-18 | Disposition: A | Discharge status: Home / Self Care | Payer: Medicare Other | Source: Skilled Nursing Facility | Attending: Internal Medicine | Admitting: Internal Medicine

## 2014-12-18 LAB — CBC WITH DIFFERENTIAL/PLATELET
Basophils Absolute: 0 10*3/uL (ref 0.0–0.1)
Basophils Relative: 0 % (ref 0–1)
Eosinophils Absolute: 0.1 10*3/uL (ref 0.0–0.7)
Eosinophils Relative: 1 % (ref 0–5)
HEMATOCRIT: 31.6 % — AB (ref 39.0–52.0)
HEMOGLOBIN: 11 g/dL — AB (ref 13.0–17.0)
LYMPHS PCT: 25 % (ref 12–46)
Lymphs Abs: 5.4 10*3/uL — ABNORMAL HIGH (ref 0.7–4.0)
MCH: 34.8 pg — ABNORMAL HIGH (ref 26.0–34.0)
MCHC: 34.8 g/dL (ref 30.0–36.0)
MCV: 100 fL (ref 78.0–100.0)
MONO ABS: 1.7 10*3/uL — AB (ref 0.1–1.0)
MONOS PCT: 8 % (ref 3–12)
NEUTROS ABS: 14.5 10*3/uL — AB (ref 1.7–7.7)
NEUTROS PCT: 67 % (ref 43–77)
Platelets: 327 10*3/uL (ref 150–400)
RBC: 3.16 MIL/uL — ABNORMAL LOW (ref 4.22–5.81)
RDW: 19.2 % — ABNORMAL HIGH (ref 11.5–15.5)
WBC Morphology: INCREASED
WBC: 21.7 10*3/uL — ABNORMAL HIGH (ref 4.0–10.5)

## 2014-12-18 LAB — COMPREHENSIVE METABOLIC PANEL
ALT: 32 U/L (ref 0–53)
AST: 112 U/L — AB (ref 0–37)
Albumin: 1.6 g/dL — ABNORMAL LOW (ref 3.5–5.2)
Alkaline Phosphatase: 202 U/L — ABNORMAL HIGH (ref 39–117)
Anion gap: 5 (ref 5–15)
BUN: 16 mg/dL (ref 6–23)
CALCIUM: 7.8 mg/dL — AB (ref 8.4–10.5)
CO2: 32 mmol/L (ref 19–32)
Chloride: 92 mmol/L — ABNORMAL LOW (ref 96–112)
Creatinine, Ser: 0.66 mg/dL (ref 0.50–1.35)
GFR calc non Af Amer: >90 mL/min (ref >90)
Glucose, Bld: 180 mg/dL — ABNORMAL HIGH (ref 70–99)
Potassium: 3.4 mmol/L — ABNORMAL LOW (ref 3.5–5.1)
Sodium: 129 mmol/L — ABNORMAL LOW (ref 135–145)
Total Bilirubin: 4.4 mg/dL — ABNORMAL HIGH (ref 0.3–1.2)
Total Protein: 5.7 g/dL — ABNORMAL LOW (ref 6.0–8.3)

## 2014-12-19 ENCOUNTER — Encounter (HOSPITAL_COMMUNITY)
Admission: RE | Admit: 2014-12-19 | Discharge: 2014-12-19 | Disposition: A | Discharge status: Home / Self Care | Payer: Medicare Other | Attending: Internal Medicine | Admitting: Internal Medicine

## 2014-12-19 LAB — BASIC METABOLIC PANEL
Anion gap: 8 (ref 5–15)
BUN: 17 mg/dL (ref 6–23)
CO2: 32 mmol/L (ref 19–32)
CREATININE: 0.66 mg/dL (ref 0.50–1.35)
Calcium: 8.3 mg/dL — ABNORMAL LOW (ref 8.4–10.5)
Chloride: 89 mmol/L — ABNORMAL LOW (ref 96–112)
GFR calc Af Amer: >90 mL/min (ref >90)
GLUCOSE: 144 mg/dL — AB (ref 70–99)
Potassium: 3.5 mmol/L (ref 3.5–5.1)
Sodium: 129 mmol/L — ABNORMAL LOW (ref 135–145)

## 2014-12-20 ENCOUNTER — Non-Acute Institutional Stay (SKILLED_NURSING_FACILITY): Payer: Medicare Other | Admitting: Internal Medicine

## 2014-12-20 DIAGNOSIS — K7031 Alcoholic cirrhosis of liver with ascites: Secondary | ICD-10-CM

## 2014-12-20 DIAGNOSIS — E43 Unspecified severe protein-calorie malnutrition: Secondary | ICD-10-CM | POA: Diagnosis not present

## 2014-12-20 DIAGNOSIS — K7290 Hepatic failure, unspecified without coma: Secondary | ICD-10-CM | POA: Diagnosis not present

## 2014-12-20 DIAGNOSIS — N2889 Other specified disorders of kidney and ureter: Secondary | ICD-10-CM

## 2014-12-20 DIAGNOSIS — R338 Other retention of urine: Secondary | ICD-10-CM | POA: Diagnosis not present

## 2014-12-20 DIAGNOSIS — K7011 Alcoholic hepatitis with ascites: Secondary | ICD-10-CM

## 2014-12-20 DIAGNOSIS — K7682 Hepatic encephalopathy: Secondary | ICD-10-CM

## 2014-12-20 DIAGNOSIS — N401 Enlarged prostate with lower urinary tract symptoms: Secondary | ICD-10-CM
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
## 2014-12-23 ENCOUNTER — Encounter (HOSPITAL_COMMUNITY)
Admission: AD | Admit: 2014-12-23 | Discharge: 2014-12-23 | Disposition: A | Discharge status: Home / Self Care | Payer: Medicare Other | Source: Skilled Nursing Facility | Attending: Internal Medicine | Admitting: Internal Medicine

## 2014-12-23 ENCOUNTER — Non-Acute Institutional Stay (SKILLED_NURSING_FACILITY): Payer: Medicare Other | Admitting: Internal Medicine

## 2014-12-23 DIAGNOSIS — K7011 Alcoholic hepatitis with ascites: Secondary | ICD-10-CM

## 2014-12-23 DIAGNOSIS — K7031 Alcoholic cirrhosis of liver with ascites: Secondary | ICD-10-CM | POA: Diagnosis not present

## 2014-12-23 DIAGNOSIS — K7290 Hepatic failure, unspecified without coma: Secondary | ICD-10-CM

## 2014-12-23 DIAGNOSIS — K7682 Hepatic encephalopathy: Secondary | ICD-10-CM

## 2014-12-23 LAB — CBC WITH DIFFERENTIAL/PLATELET
Basophils Absolute: 0 10*3/uL (ref 0.0–0.1)
Basophils Relative: 0 % (ref 0–1)
Eosinophils Absolute: 0.2 10*3/uL (ref 0.0–0.7)
Eosinophils Relative: 1 % (ref 0–5)
HEMATOCRIT: 35.4 % — AB (ref 39.0–52.0)
HEMOGLOBIN: 12.2 g/dL — AB (ref 13.0–17.0)
LYMPHS PCT: 20 % (ref 12–46)
Lymphs Abs: 5.7 10*3/uL — ABNORMAL HIGH (ref 0.7–4.0)
MCH: 35.4 pg — ABNORMAL HIGH (ref 26.0–34.0)
MCHC: 34.5 g/dL (ref 30.0–36.0)
MCV: 102.6 fL — ABNORMAL HIGH (ref 78.0–100.0)
MONOS PCT: 4 % (ref 3–12)
Monocytes Absolute: 1.1 10*3/uL — ABNORMAL HIGH (ref 0.1–1.0)
NEUTROS ABS: 21.7 10*3/uL — AB (ref 1.7–7.7)
Neutrophils Relative %: 76 % (ref 43–77)
Platelets: 575 10*3/uL — ABNORMAL HIGH (ref 150–400)
RBC: 3.45 MIL/uL — ABNORMAL LOW (ref 4.22–5.81)
RDW: 19.5 % — ABNORMAL HIGH (ref 11.5–15.5)
Smear Review: INCREASED
WBC: 28.7 10*3/uL — ABNORMAL HIGH (ref 4.0–10.5)

## 2014-12-23 LAB — COMPREHENSIVE METABOLIC PANEL
ALT: 76 U/L — ABNORMAL HIGH (ref 0–53)
AST: 168 U/L — ABNORMAL HIGH (ref 0–37)
Albumin: 2.3 g/dL — ABNORMAL LOW (ref 3.5–5.2)
Alkaline Phosphatase: 212 U/L — ABNORMAL HIGH (ref 39–117)
Anion gap: 8 (ref 5–15)
BUN: 19 mg/dL (ref 6–23)
CALCIUM: 8.7 mg/dL (ref 8.4–10.5)
CHLORIDE: 95 mmol/L — AB (ref 96–112)
CO2: 24 mmol/L (ref 19–32)
CREATININE: 0.63 mg/dL (ref 0.50–1.35)
GLUCOSE: 124 mg/dL — AB (ref 70–99)
Potassium: 4.6 mmol/L (ref 3.5–5.1)
Sodium: 127 mmol/L — ABNORMAL LOW (ref 135–145)
Total Bilirubin: 4.2 mg/dL — ABNORMAL HIGH (ref 0.3–1.2)
Total Protein: 6.7 g/dL (ref 6.0–8.3)

## 2014-12-26 ENCOUNTER — Encounter (HOSPITAL_COMMUNITY): Payer: Self-pay | Admitting: *Deleted

## 2014-12-26 ENCOUNTER — Inpatient Hospital Stay (HOSPITAL_COMMUNITY)
Admission: EM | Admit: 2014-12-26 | Discharge: 2014-12-30 | DRG: 641 | Disposition: A | Discharge status: Home Health | Payer: Medicare Other | Attending: Internal Medicine | Admitting: Internal Medicine

## 2014-12-26 ENCOUNTER — Encounter (HOSPITAL_COMMUNITY)
Admission: RE | Admit: 2014-12-26 | Discharge: 2014-12-26 | Disposition: A | Discharge status: Home / Self Care | Payer: Medicare Other | Attending: Internal Medicine | Admitting: Internal Medicine

## 2014-12-26 ENCOUNTER — Other Ambulatory Visit: Payer: Self-pay

## 2014-12-26 ENCOUNTER — Emergency Department (HOSPITAL_COMMUNITY): Payer: Medicare Other

## 2014-12-26 ENCOUNTER — Other Ambulatory Visit (HOSPITAL_COMMUNITY): Payer: Self-pay

## 2014-12-26 DIAGNOSIS — E875 Hyperkalemia: Secondary | ICD-10-CM | POA: Diagnosis present

## 2014-12-26 DIAGNOSIS — R531 Weakness: Secondary | ICD-10-CM

## 2014-12-26 DIAGNOSIS — K7031 Alcoholic cirrhosis of liver with ascites: Secondary | ICD-10-CM | POA: Diagnosis not present

## 2014-12-26 DIAGNOSIS — E739 Lactose intolerance, unspecified: Secondary | ICD-10-CM | POA: Diagnosis present

## 2014-12-26 DIAGNOSIS — E871 Hypo-osmolality and hyponatremia: Secondary | ICD-10-CM | POA: Diagnosis not present

## 2014-12-26 DIAGNOSIS — Z791 Long term (current) use of non-steroidal anti-inflammatories (NSAID): Secondary | ICD-10-CM

## 2014-12-26 DIAGNOSIS — K7030 Alcoholic cirrhosis of liver without ascites: Secondary | ICD-10-CM | POA: Diagnosis present

## 2014-12-26 DIAGNOSIS — K7010 Alcoholic hepatitis without ascites: Secondary | ICD-10-CM | POA: Diagnosis present

## 2014-12-26 DIAGNOSIS — Z87891 Personal history of nicotine dependence: Secondary | ICD-10-CM

## 2014-12-26 DIAGNOSIS — E7439 Other disorders of intestinal carbohydrate absorption: Secondary | ICD-10-CM | POA: Diagnosis present

## 2014-12-26 DIAGNOSIS — I10 Essential (primary) hypertension: Secondary | ICD-10-CM | POA: Diagnosis present

## 2014-12-26 DIAGNOSIS — R739 Hyperglycemia, unspecified: Secondary | ICD-10-CM | POA: Diagnosis not present

## 2014-12-26 DIAGNOSIS — K7460 Unspecified cirrhosis of liver: Secondary | ICD-10-CM | POA: Diagnosis present

## 2014-12-26 DIAGNOSIS — Z7982 Long term (current) use of aspirin: Secondary | ICD-10-CM

## 2014-12-26 DIAGNOSIS — Z8249 Family history of ischemic heart disease and other diseases of the circulatory system: Secondary | ICD-10-CM

## 2014-12-26 DIAGNOSIS — R899 Unspecified abnormal finding in specimens from other organs, systems and tissues: Secondary | ICD-10-CM

## 2014-12-26 DIAGNOSIS — I2510 Atherosclerotic heart disease of native coronary artery without angina pectoris: Secondary | ICD-10-CM | POA: Diagnosis present

## 2014-12-26 DIAGNOSIS — E861 Hypovolemia: Secondary | ICD-10-CM | POA: Diagnosis present

## 2014-12-26 DIAGNOSIS — T380X5A Adverse effect of glucocorticoids and synthetic analogues, initial encounter: Secondary | ICD-10-CM | POA: Diagnosis present

## 2014-12-26 DIAGNOSIS — D72829 Elevated white blood cell count, unspecified: Secondary | ICD-10-CM | POA: Diagnosis not present

## 2014-12-26 DIAGNOSIS — J40 Bronchitis, not specified as acute or chronic: Secondary | ICD-10-CM | POA: Diagnosis not present

## 2014-12-26 DIAGNOSIS — T502X5A Adverse effect of carbonic-anhydrase inhibitors, benzothiadiazides and other diuretics, initial encounter: Secondary | ICD-10-CM | POA: Diagnosis present

## 2014-12-26 DIAGNOSIS — R799 Abnormal finding of blood chemistry, unspecified: Secondary | ICD-10-CM | POA: Diagnosis not present

## 2014-12-26 DIAGNOSIS — K717 Toxic liver disease with fibrosis and cirrhosis of liver: Secondary | ICD-10-CM | POA: Diagnosis not present

## 2014-12-26 DIAGNOSIS — E1165 Type 2 diabetes mellitus with hyperglycemia: Secondary | ICD-10-CM | POA: Diagnosis not present

## 2014-12-26 LAB — BASIC METABOLIC PANEL
Anion gap: 7 (ref 5–15)
BUN: 23 mg/dL (ref 6–23)
CALCIUM: 8.6 mg/dL (ref 8.4–10.5)
CHLORIDE: 91 mmol/L — AB (ref 96–112)
CO2: 22 mmol/L (ref 19–32)
CREATININE: 0.71 mg/dL (ref 0.50–1.35)
GFR calc Af Amer: >90 mL/min (ref >90)
GFR calc non Af Amer: >90 mL/min (ref >90)
GLUCOSE: 434 mg/dL — AB (ref 70–99)
Potassium: 5.5 mmol/L — ABNORMAL HIGH (ref 3.5–5.1)
Sodium: 120 mmol/L — ABNORMAL LOW (ref 135–145)

## 2014-12-26 LAB — COMPREHENSIVE METABOLIC PANEL
ALT: 84 U/L — ABNORMAL HIGH (ref 0–53)
ANION GAP: 7 (ref 5–15)
AST: 110 U/L — AB (ref 0–37)
Albumin: 2.7 g/dL — ABNORMAL LOW (ref 3.5–5.2)
Alkaline Phosphatase: 269 U/L — ABNORMAL HIGH (ref 39–117)
BILIRUBIN TOTAL: 3 mg/dL — AB (ref 0.3–1.2)
BUN: 25 mg/dL — AB (ref 6–23)
CALCIUM: 8.7 mg/dL (ref 8.4–10.5)
CHLORIDE: 92 mmol/L — AB (ref 96–112)
CO2: 21 mmol/L (ref 19–32)
Creatinine, Ser: 0.77 mg/dL (ref 0.50–1.35)
GFR calc Af Amer: >90 mL/min (ref >90)
Glucose, Bld: 486 mg/dL — ABNORMAL HIGH (ref 70–99)
POTASSIUM: 5.8 mmol/L — AB (ref 3.5–5.1)
SODIUM: 120 mmol/L — AB (ref 135–145)
Total Protein: 7.4 g/dL (ref 6.0–8.3)

## 2014-12-26 LAB — CBC WITH DIFFERENTIAL/PLATELET
BASOS ABS: 0 10*3/uL (ref 0.0–0.1)
BASOS ABS: 0 10*3/uL (ref 0.0–0.1)
Basophils Relative: 0 % (ref 0–1)
Basophils Relative: 0 % (ref 0–1)
EOS ABS: 0 10*3/uL (ref 0.0–0.7)
EOS PCT: 0 % (ref 0–5)
EOS PCT: 0 % (ref 0–5)
Eosinophils Absolute: 0 10*3/uL (ref 0.0–0.7)
HCT: 35.7 % — ABNORMAL LOW (ref 39.0–52.0)
HCT: 36.2 % — ABNORMAL LOW (ref 39.0–52.0)
HEMOGLOBIN: 12.4 g/dL — AB (ref 13.0–17.0)
Hemoglobin: 12.4 g/dL — ABNORMAL LOW (ref 13.0–17.0)
Lymphocytes Relative: 10 % — ABNORMAL LOW (ref 12–46)
Lymphocytes Relative: 11 % — ABNORMAL LOW (ref 12–46)
Lymphs Abs: 2.4 10*3/uL (ref 0.7–4.0)
Lymphs Abs: 2.3 10*3/uL (ref 0.7–4.0)
MCH: 36.4 pg — ABNORMAL HIGH (ref 26.0–34.0)
MCH: 36 pg — AB (ref 26.0–34.0)
MCHC: 34.7 g/dL (ref 30.0–36.0)
MCHC: 34.3 g/dL (ref 30.0–36.0)
MCV: 104.7 fL — AB (ref 78.0–100.0)
MCV: 105.2 fL — AB (ref 78.0–100.0)
MONO ABS: 0.9 10*3/uL (ref 0.1–1.0)
Monocytes Absolute: 0.9 10*3/uL (ref 0.1–1.0)
Monocytes Relative: 4 % (ref 3–12)
Monocytes Relative: 4 % (ref 3–12)
Neutro Abs: 19.4 10*3/uL — ABNORMAL HIGH (ref 1.7–7.7)
Neutro Abs: 17.9 10*3/uL — ABNORMAL HIGH (ref 1.7–7.7)
Neutrophils Relative %: 86 % — ABNORMAL HIGH (ref 43–77)
Neutrophils Relative %: 85 % — ABNORMAL HIGH (ref 43–77)
PLATELETS: 598 10*3/uL — AB (ref 150–400)
Platelets: 580 10*3/uL — ABNORMAL HIGH (ref 150–400)
RBC: 3.41 MIL/uL — ABNORMAL LOW (ref 4.22–5.81)
RBC: 3.44 MIL/uL — ABNORMAL LOW (ref 4.22–5.81)
RDW: 18.3 % — AB (ref 11.5–15.5)
RDW: 18.1 % — AB (ref 11.5–15.5)
WBC: 22.8 10*3/uL — AB (ref 4.0–10.5)
WBC: 21.1 10*3/uL — ABNORMAL HIGH (ref 4.0–10.5)

## 2014-12-26 LAB — URINALYSIS, ROUTINE W REFLEX MICROSCOPIC
Bilirubin Urine: NEGATIVE
Glucose, UA: >1000 mg/dL — AB
Hgb urine dipstick: NEGATIVE
Ketones, ur: NEGATIVE mg/dL
Nitrite: NEGATIVE
PH: 7 (ref 5.0–8.0)
Protein, ur: NEGATIVE mg/dL
SPECIFIC GRAVITY, URINE: 1.01 (ref 1.005–1.030)
Urobilinogen, UA: 1 mg/dL (ref 0.0–1.0)

## 2014-12-26 LAB — URINE MICROSCOPIC-ADD ON

## 2014-12-26 IMAGING — DX DG CHEST 2V
2 series · 2 of 2 positions shown · non-contrast
Comparison: [DATE]

CLINICAL DATA: Abnormal labs.  Bronchitis.  Hypertension.

EXAM:
CHEST  2 VIEW

[chest pa]
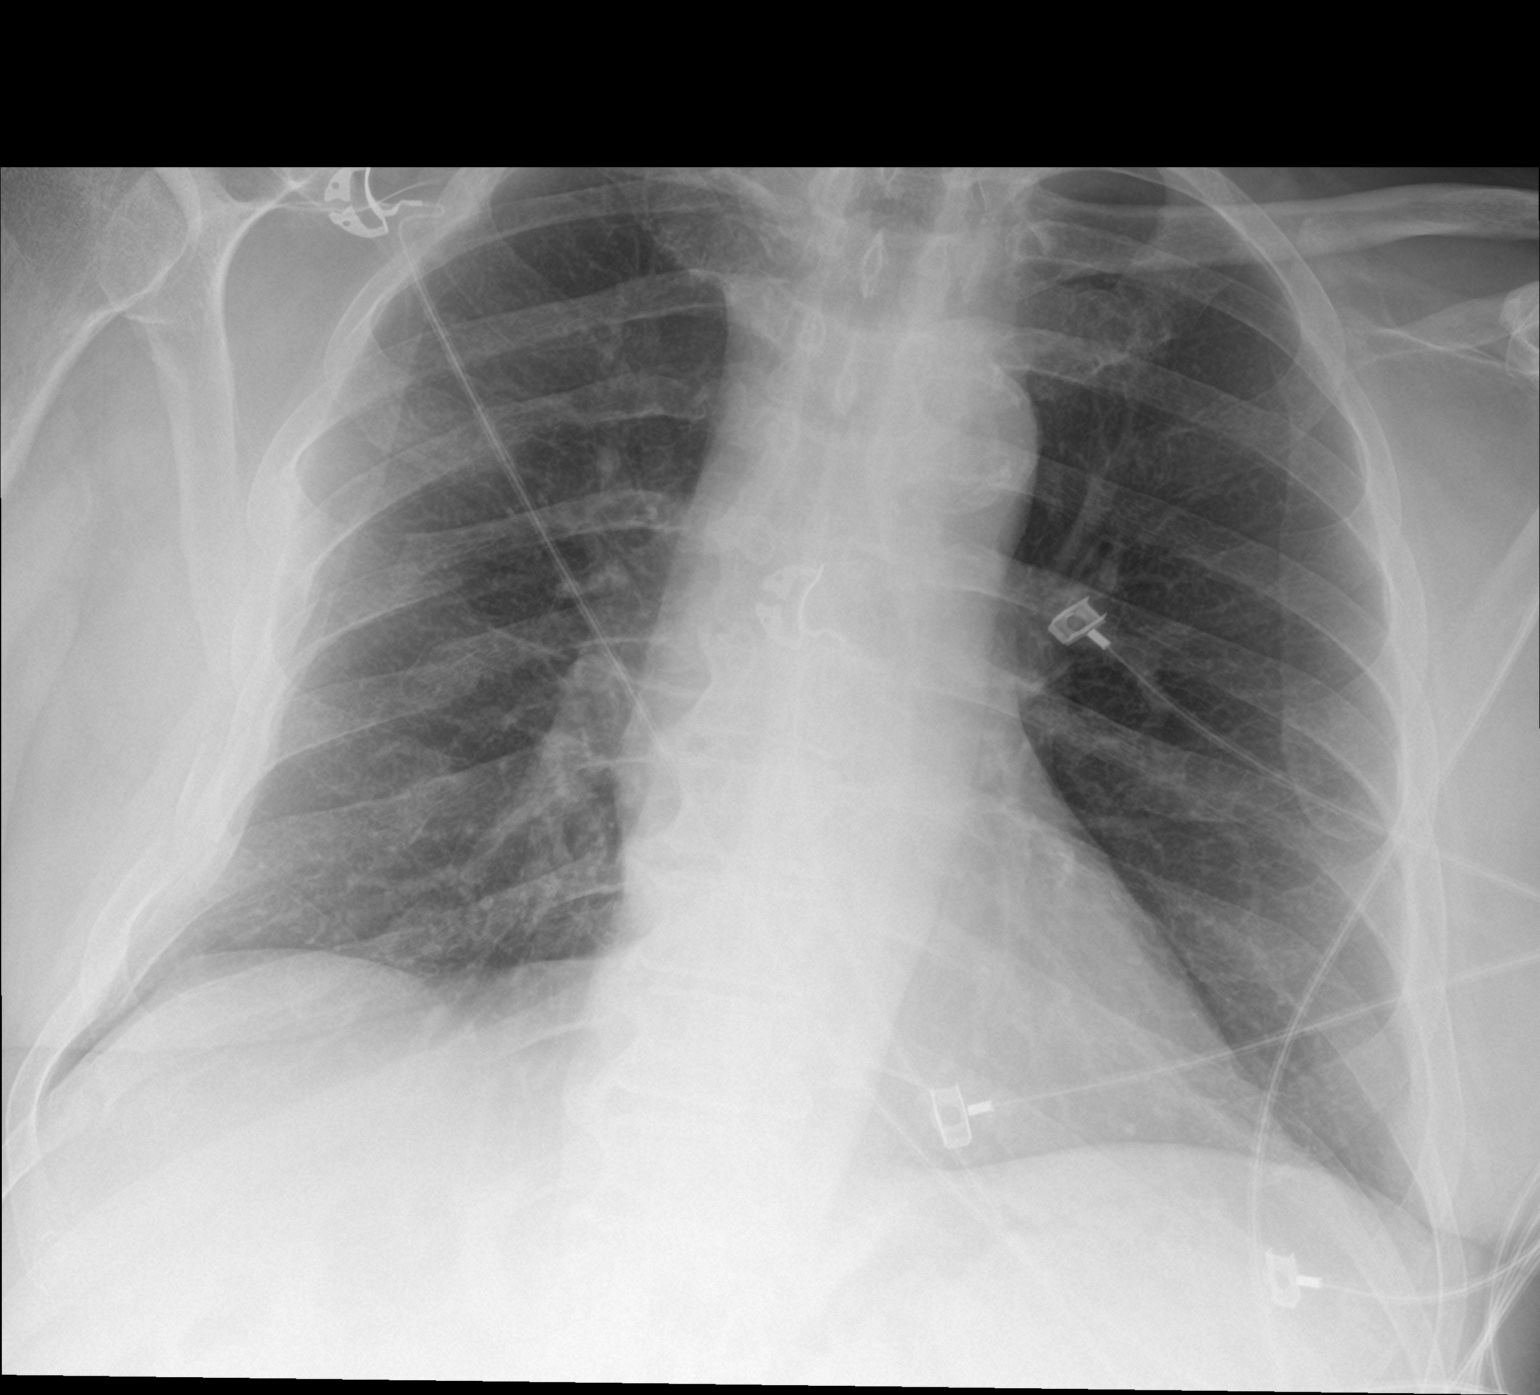

[chest lat]
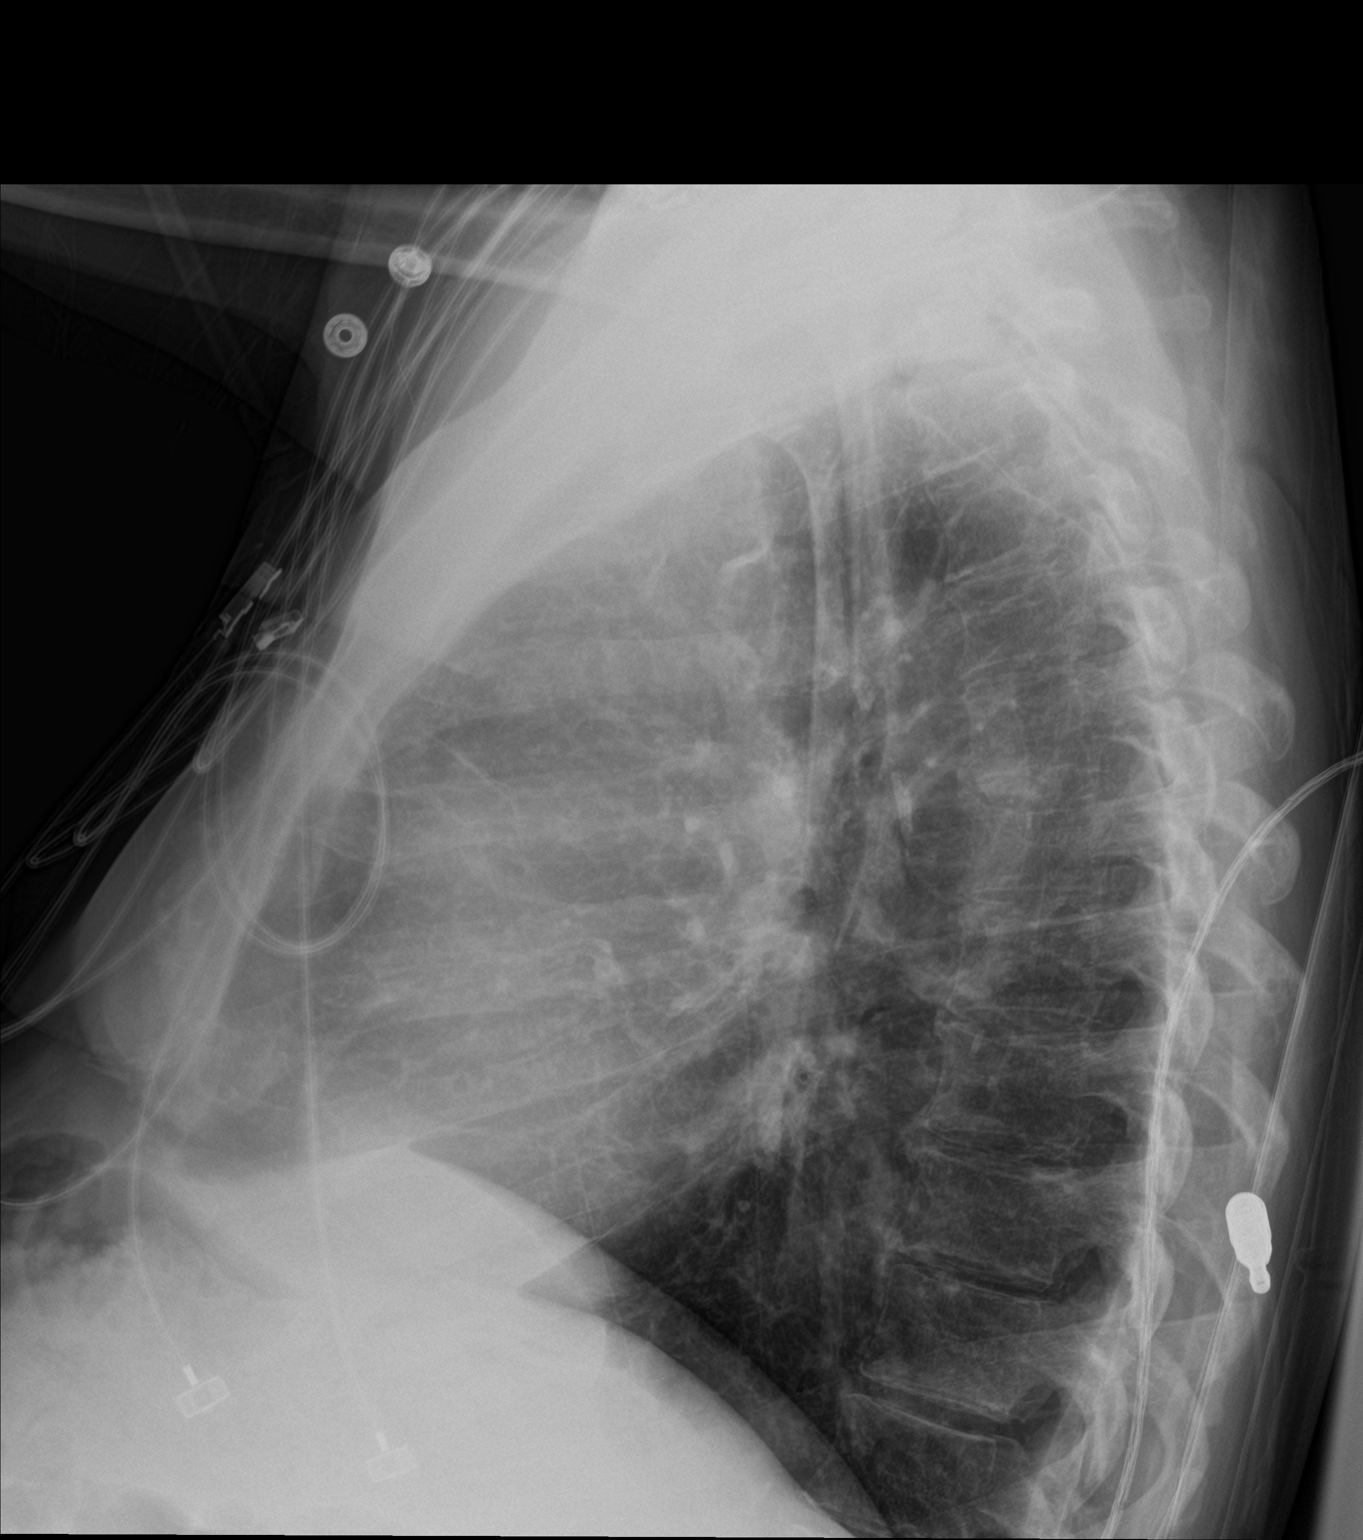

[2 of 2 positions shown; findings below may reference images not displayed]

FINDINGS: Mild hyperinflation. Midline trachea. Borderline cardiomegaly.
Atherosclerosis in the transverse aorta. No pleural effusion or
pneumothorax. Clear lungs. No congestive failure. Remote right rib
trauma.
IMPRESSION: Hyperinflation, without acute disease.

## 2014-12-26 MED ORDER — SODIUM POLYSTYRENE SULFONATE 15 GM/60ML PO SUSP
15.0000 g | Freq: Once | ORAL | Status: AC
  Administered 2014-12-27: 15 g via ORAL
  Filled 2014-12-26: qty 60
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
## 2014-12-26 NOTE — ED Notes (Addendum)
Pt from the [facility]. States Na is low,  K and glucose was high. Per [facility] nurse to normally takes NA tablets at home but was not on them since being at the facility. Pt brother brought his from home & has taken 3g today.

## 2014-12-27 LAB — COMPREHENSIVE METABOLIC PANEL
ALT: 75 U/L — ABNORMAL HIGH (ref 17–63)
AST: 89 U/L — AB (ref 15–41)
Albumin: 2.6 g/dL — ABNORMAL LOW (ref 3.5–5.0)
Alkaline Phosphatase: 220 U/L — ABNORMAL HIGH (ref 38–126)
Anion gap: 9 (ref 5–15)
BUN: 26 mg/dL — ABNORMAL HIGH (ref 6–20)
CALCIUM: 8.6 mg/dL — AB (ref 8.9–10.3)
CHLORIDE: 96 mmol/L — AB (ref 101–111)
CO2: 22 mmol/L (ref 22–32)
CREATININE: 0.63 mg/dL (ref 0.61–1.24)
GFR calc Af Amer: >60 mL/min (ref >60)
GFR calc non Af Amer: >60 mL/min (ref >60)
Glucose, Bld: 191 mg/dL — ABNORMAL HIGH (ref 70–99)
Potassium: 4.7 mmol/L (ref 3.5–5.1)
Sodium: 127 mmol/L — ABNORMAL LOW (ref 135–145)
Total Bilirubin: 3.1 mg/dL — ABNORMAL HIGH (ref 0.3–1.2)
Total Protein: 7.1 g/dL (ref 6.5–8.1)

## 2014-12-27 LAB — GLUCOSE, CAPILLARY
GLUCOSE-CAPILLARY: 288 mg/dL — AB (ref 70–99)
GLUCOSE-CAPILLARY: 173 mg/dL — AB (ref 70–99)
Glucose-Capillary: 356 mg/dL — ABNORMAL HIGH (ref 70–99)
Glucose-Capillary: 114 mg/dL — ABNORMAL HIGH (ref 70–99)
Glucose-Capillary: 211 mg/dL — ABNORMAL HIGH (ref 70–99)
Glucose-Capillary: 234 mg/dL — ABNORMAL HIGH (ref 70–99)

## 2014-12-27 LAB — TSH: TSH: 1.719 u[IU]/mL (ref 0.350–4.500)

## 2014-12-27 MED ORDER — FUROSEMIDE 10 MG/ML IJ SOLN
40.0000 mg | Freq: Every day | INTRAMUSCULAR | Status: DC
  Administered 2014-12-27: 40 mg via INTRAVENOUS
  Filled 2014-12-27: qty 4

## 2014-12-27 MED ORDER — INSULIN ASPART 100 UNIT/ML ~~LOC~~ SOLN
0.0000 [IU] | SUBCUTANEOUS | Status: DC
  Administered 2014-12-27: 4 [IU] via SUBCUTANEOUS
  Administered 2014-12-27: 7 [IU] via SUBCUTANEOUS
  Administered 2014-12-27: 20 [IU] via SUBCUTANEOUS
  Administered 2014-12-27: 11 [IU] via SUBCUTANEOUS
  Administered 2014-12-27: 7 [IU] via SUBCUTANEOUS
  Administered 2014-12-28 (×2): 4 [IU] via SUBCUTANEOUS

## 2014-12-27 MED ORDER — PANTOPRAZOLE SODIUM 40 MG PO TBEC
40.0000 mg | DELAYED_RELEASE_TABLET | Freq: Every day | ORAL | Status: DC
  Administered 2014-12-27 – 2014-12-30 (×4): 40 mg via ORAL
  Filled 2014-12-27 (×4): qty 1

## 2014-12-27 MED ORDER — OMEGA-3-ACID ETHYL ESTERS 1 G PO CAPS
1.0000 g | ORAL_CAPSULE | Freq: Every day | ORAL | Status: DC
  Administered 2014-12-27 – 2014-12-30 (×4): 1 g via ORAL
  Filled 2014-12-27 (×4): qty 1

## 2014-12-27 MED ORDER — FOLIC ACID 1 MG PO TABS
1.0000 mg | ORAL_TABLET | Freq: Every day | ORAL | Status: DC
  Administered 2014-12-27 – 2014-12-30 (×4): 1 mg via ORAL
  Filled 2014-12-27 (×4): qty 1

## 2014-12-27 MED ORDER — PREDNISOLONE 15 MG/5ML PO SOLN
15.0000 mg | Freq: Every day | ORAL | Status: DC
  Administered 2014-12-27 – 2014-12-30 (×4): 15 mg via ORAL
  Filled 2014-12-27 (×6): qty 5

## 2014-12-27 MED ORDER — LACTULOSE 10 GM/15ML PO SOLN
20.0000 g | Freq: Two times a day (BID) | ORAL | Status: DC
  Administered 2014-12-27 – 2014-12-29 (×7): 20 g via ORAL
  Filled 2014-12-27 (×8): qty 30

## 2014-12-27 MED ORDER — SODIUM CHLORIDE 0.9 % IV SOLN
INTRAVENOUS | Status: DC
  Administered 2014-12-27: 01:00:00 via INTRAVENOUS

## 2014-12-27 MED ORDER — PROPRANOLOL HCL 20 MG PO TABS
10.0000 mg | ORAL_TABLET | Freq: Two times a day (BID) | ORAL | Status: DC
  Administered 2014-12-27 – 2014-12-30 (×8): 10 mg via ORAL
  Filled 2014-12-27 (×8): qty 1

## 2014-12-27 MED ORDER — SODIUM CHLORIDE 0.9 % IV SOLN: INTRAVENOUS | Status: DC

## 2014-12-27 MED ORDER — FUROSEMIDE 20 MG PO TABS
20.0000 mg | ORAL_TABLET | Freq: Two times a day (BID) | ORAL | Status: DC
  Administered 2014-12-27 – 2014-12-28 (×2): 20 mg via ORAL
  Filled 2014-12-27 (×2): qty 1

## 2014-12-27 MED ORDER — SPIRONOLACTONE 25 MG PO TABS: 50.0000 mg | ORAL_TABLET | Freq: Two times a day (BID) | ORAL | Status: DC

## 2014-12-27 MED ORDER — ASPIRIN EC 81 MG PO TBEC
81.0000 mg | DELAYED_RELEASE_TABLET | Freq: Every day | ORAL | Status: DC
  Administered 2014-12-27 – 2014-12-30 (×4): 81 mg via ORAL
  Filled 2014-12-27 (×4): qty 1

## 2014-12-27 MED ORDER — INSULIN DETEMIR 100 UNIT/ML ~~LOC~~ SOLN
5.0000 [IU] | Freq: Two times a day (BID) | SUBCUTANEOUS | Status: DC
  Administered 2014-12-27 – 2014-12-30 (×7): 5 [IU] via SUBCUTANEOUS
  Filled 2014-12-27 (×9): qty 0.05

## 2014-12-27 MED ORDER — IPRATROPIUM-ALBUTEROL 0.5-2.5 (3) MG/3ML IN SOLN
3.0000 mL | Freq: Three times a day (TID) | RESPIRATORY_TRACT | Status: DC
  Administered 2014-12-27 – 2014-12-30 (×11): 3 mL via RESPIRATORY_TRACT
  Filled 2014-12-27 (×11): qty 3

## 2014-12-27 MED ORDER — SODIUM CHLORIDE 0.9 % IJ SOLN
3.0000 mL | Freq: Two times a day (BID) | INTRAMUSCULAR | Status: DC
  Administered 2014-12-27 (×2): 3 mL via INTRAVENOUS

## 2014-12-27 MED ORDER — ALBUTEROL SULFATE (2.5 MG/3ML) 0.083% IN NEBU: 3.0000 mL | INHALATION_SOLUTION | Freq: Four times a day (QID) | RESPIRATORY_TRACT | Status: DC | PRN

## 2014-12-27 MED ORDER — ENSURE ENLIVE PO LIQD
237.0000 mL | Freq: Two times a day (BID) | ORAL | Status: DC
  Administered 2014-12-27 – 2014-12-30 (×7): 237 mL via ORAL

## 2014-12-27 MED ORDER — PREDNISOLONE 15 MG/5ML PO SOLN
40.0000 mg | Freq: Every day | ORAL | Status: DC
  Filled 2014-12-27 (×2): qty 15

## 2014-12-27 MED ORDER — HEPARIN SODIUM (PORCINE) 5000 UNIT/ML IJ SOLN
5000.0000 [IU] | Freq: Three times a day (TID) | INTRAMUSCULAR | Status: DC
  Administered 2014-12-27 – 2014-12-30 (×11): 5000 [IU] via SUBCUTANEOUS
  Filled 2014-12-27 (×11): qty 1

## 2014-12-27 MED ORDER — PREDNISOLONE 15 MG/5ML PO SOLN
15.0000 mg | Freq: Every day | ORAL | Status: DC
  Filled 2014-12-27: qty 5

## 2014-12-27 NOTE — Progress Notes (Signed)
[facility] PROGRESS NOTE   Assessment/Plan: Hyperglycemia: - Blood glucose is improving significantly, with just sliding scale insulin. - We'll continue CBGs before meals and at bedtime. Will add low-dose Lantus. - check A1c.  Hyponatremia Hypervolemia - There is a component of severe hypoglycemia issue is being over 400. - As improve within movement of the blood glucose, but there seems to be a component of hypervolemia, cont lasix and aldactone.  Weakness generalized: - Likely due to hyponatremia will get a physical therapy consult.  Cirrhosis of liver: - Hold Aldactone.  Essential hypertension - Bp seems stable. -   Code Status: Full Family Communication: none Disposition Plan: inpatient   Consultants:  none  Procedures:  CXR  Antibiotics:  none (indicate start date, and stop date if known)  HPI/Subjective: Relates he has had several BM.  Objective: Filed Vitals:   12/27/14 0009 12/27/14 0055 12/27/14 0530 12/27/14 0915  BP: 103/89 130/70 128/65   Pulse: 66 66 72   Temp:  97.9 F (36.6 C) 98 F (36.7 C)   TempSrc:  Oral Oral   Resp: 18 18 18    Height:  5\' 5"  (1.651 m)    Weight:  77.747 kg (171 lb 6.4 oz)    SpO2: 97% 99% 95% 99%    Intake/Output Summary (Last 24 hours) at 12/27/14 1051 Last data filed at 12/27/14 0600  Gross per 24 hour  Intake 793.75 ml  Output   1775 ml  Net -981.25 ml   Filed Weights   12/26/14 2043 12/27/14 0055  Weight: 82.555 kg (182 lb) 77.747 kg (171 lb 6.4 oz)    Exam:  General: Alert, awake, oriented x3, in no acute distress.  HEENT: No bruits, no goiter.  Heart: Regular rate and rhythm. Lungs: Good air movement, clear Abdomen: Soft, nontender, nondistended, positive bowel sounds.  Neuro: Grossly intact, nonfocal.   Data Reviewed: Basic Metabolic Panel:  Recent Labs Lab 12/23/14 0750 12/26/14 1632 12/26/14 2154 12/27/14 0632  NA 127* 120* 120* 127*  K 4.6 5.5* 5.8* 4.7  CL 95* 91*  92* 96*  CO2 24 22 21 22   GLUCOSE 124* 434* 486* 191*  BUN 19 23 25* 26*  CREATININE 0.63 0.71 0.77 0.63  CALCIUM 8.7 8.6 8.7 8.6*   Liver Function Tests:  Recent Labs Lab 12/23/14 0750 12/26/14 2154 12/27/14 0632  AST 168* 110* 89*  ALT 76* 84* 75*  ALKPHOS 212* 269* 220*  BILITOT 4.2* 3.0* 3.1*  PROT 6.7 7.4 7.1  ALBUMIN 2.3* 2.7* 2.6*   No results for input(s): LIPASE, AMYLASE in the last 168 hours. No results for input(s): AMMONIA in the last 168 hours. CBC:  Recent Labs Lab 12/23/14 0750 12/26/14 1632 12/26/14 2154  WBC 28.7* 22.8* 21.1*  NEUTROABS 21.7* 19.4* 17.9*  HGB 12.2* 12.4* 12.4*  HCT 35.4* 35.7* 36.2*  MCV 102.6* 104.7* 105.2*  PLT 575* 580* 598*   Cardiac Enzymes: No results for input(s): CKTOTAL, CKMB, CKMBINDEX, TROPONINI in the last 168 hours. BNP (last 3 results)  Recent Labs  12/14/14 0125  BNP 250.0*    ProBNP (last 3 results)  Recent Labs  04/25/14 0620  PROBNP 324.5*    CBG:  Recent Labs Lab 12/27/14 0059 12/27/14 0440 12/27/14 0736  GLUCAP 356* 114* 211*    No results found for this or any previous visit (from the past 240 hour(s)).   Studies: Dg Chest 2 View  12/26/2014   CLINICAL DATA:  Abnormal labs.  Bronchitis.  Hypertension.  EXAM: CHEST  2 VIEW  COMPARISON:  12/16/2014  FINDINGS: Mild hyperinflation. Midline trachea. Borderline cardiomegaly. Atherosclerosis in the transverse aorta. No pleural effusion or pneumothorax. Clear lungs. No congestive failure. Remote right rib trauma.  IMPRESSION: Hyperinflation, without acute disease.   Electronically Signed   By: Jeronimo Greaves M.D.   On: 12/26/2014 22:33    Scheduled Meds: . aspirin EC  81 mg Oral Daily  . feeding supplement (ENSURE ENLIVE)  237 mL Oral BID BM  . folic acid  1 mg Oral Daily  . furosemide  40 mg Intravenous Daily  . heparin  5,000 Units Subcutaneous 3 times per day  . insulin aspart  0-20 Units Subcutaneous 6 times per day  . ipratropium-albuterol   3 mL Nebulization TID  . lactulose  20 g Oral BID  . omega-3 acid ethyl esters  1 g Oral Daily  . pantoprazole  40 mg Oral Daily  . prednisoLONE  40 mg Oral Daily  . propranolol  10 mg Oral BID  . sodium chloride  3 mL Intravenous Q12H   Continuous Infusions: . sodium chloride 75 mL/hr at 12/27/14 0049    Time Spent: 25 min   [name]  [facility] Pager [phone]. If 7PM-7AM, please contact night-coverage at [email or web address], password [facility] 12/27/2014, 10:51 AM  LOS: 1 day

## 2014-12-27 NOTE — Progress Notes (Signed)
Patient ID: Billy Stone, male   DOB: Feb 03, 1951, 64 y.o.   MRN: [id number]                PROGRESS NOTE  DATE:  12/23/2014            FACILITY: [facility]                          LEVEL OF CARE:   SNF   Acute Visit                         CHIEF COMPLAINT:  Follow up admission.     HISTORY OF PRESENT ILLNESS:  This is a patient whom I admitted to the building on 12/20/2014.  He was previously in hospital with alcoholic hepatitis with jaundice.  He was started on a tapering course of prednisolone.    An ultrasound of the abdomen indicated cirrhosis without ascites.  His albumin was 1.4.  He was started on Lasix and spironolactone.    He was also noted to have significant leukocytosis.  This was felt secondary to steroid use.  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    Finally, he was noted to be encephalopathic with an ammonia level of 93.  He was started on lactulose.    The patient's major complaint today is widespread pain involving all his joints.   He does not complain of morning stiffness.  He does not have any analgesics ordered.  He did not talk to me about this the other day.    LABORATORY DATA:  Lab work today shows a sodium of 127, BUN of 19, creatinine of 0.63.  Alk phos is 212.  Albumin is improved at 2.3.  AST is 169, which is up from 112 on 12/18/2014.    His ALT is 76, also up.    His white count is up at 28.7, although the differential count is normal.    REVIEW OF SYSTEMS:    CHEST/RESPIRATORY:  No cough.  No sputum.     CARDIAC:  No clear chest pain.   GI:  No abdominal pain.  He is having 3-4 liquid bowel movements a day on lactulose.   GU:  He has a Foley catheter in place.  Longstanding urinary retention, normally doing in-and-out caths.    PHYSICAL EXAMINATION:   GENERAL APPEARANCE:  The patient is awake, conversational.  He still does not look well.   HEENT:   EYES:  Still scleral icterus present.    CHEST/RESPIRATORY:  Clear air entry  bilaterally.    CARDIOVASCULAR:   CARDIAC:  He appears to be euvolemic.         GASTROINTESTINAL:   ABDOMEN:  Still jaundiced.   LIVER/SPLEEN/KIDNEYS:  No liver, no spleen palpable.   No tenderness.     GENITOURINARY:   BLADDER:  No suprapubic or costovertebral angle tenderness.   CIRCULATION:   EDEMA/VARICOSITIES:  Extremities:  Mild edema.    ASSESSMENT/PLAN:                   Alcoholic hepatitis.   Still on prednisolone 40 mg a day until 01/14/2015.    Hepatic encephalopathy.  I still think this man is encephalopathic, although it is mild.  He still has mild asterixis.    Fluid volume overload.  He is still edematous.  He is certainly not dehydrated.  His potassium today was  4.6.  He is not on potassium, although he is on aldactone 25 b.i.d.   BUN and creatinine are within normal range.    Hypoalbuminemia.  This is actually improved.    Elevated liver function tests.  His bilirubin is 4.2.  It was 4.4 on 12/18/2014.   His transaminases, however, are slightly elevated.  I am simply going to watch this for now.    Leukocytosis.  This is actually higher than when he came into the building.  I do not really know what to make of the issue.  I think it is still all related to the prednisone.  I am sure his lack of a spleen contributes to this, as well.    Complaints of widespread joint pain.  He has no evidence of active arthritis that I can see.  I am going to leave him a p.r.n. NSAID.     CPT CODE: 83584

## 2014-12-28 LAB — GLUCOSE, CAPILLARY
GLUCOSE-CAPILLARY: 220 mg/dL — AB (ref 70–99)
Glucose-Capillary: 106 mg/dL — ABNORMAL HIGH (ref 70–99)
Glucose-Capillary: 153 mg/dL — ABNORMAL HIGH (ref 70–99)
Glucose-Capillary: 160 mg/dL — ABNORMAL HIGH (ref 70–99)
Glucose-Capillary: 175 mg/dL — ABNORMAL HIGH (ref 70–99)
Glucose-Capillary: 239 mg/dL — ABNORMAL HIGH (ref 70–99)

## 2014-12-28 LAB — BASIC METABOLIC PANEL
Anion gap: 10 (ref 5–15)
BUN: 30 mg/dL — ABNORMAL HIGH (ref 6–20)
CO2: 23 mmol/L (ref 22–32)
Calcium: 8.8 mg/dL — ABNORMAL LOW (ref 8.9–10.3)
Chloride: 93 mmol/L — ABNORMAL LOW (ref 101–111)
Creatinine, Ser: 0.74 mg/dL (ref 0.61–1.24)
GFR calc Af Amer: >60 mL/min (ref >60)
GFR calc non Af Amer: >60 mL/min (ref >60)
Glucose, Bld: 170 mg/dL — ABNORMAL HIGH (ref 70–99)
Potassium: 4.1 mmol/L (ref 3.5–5.1)
Sodium: 126 mmol/L — ABNORMAL LOW (ref 135–145)

## 2014-12-28 LAB — HEMOGLOBIN A1C
HEMOGLOBIN A1C: 5.9 % — AB (ref 4.8–5.6)
Mean Plasma Glucose: 123 mg/dL

## 2014-12-28 MED ORDER — SODIUM CHLORIDE 0.9 % IV SOLN
INTRAVENOUS | Status: DC
  Administered 2014-12-28 – 2014-12-29 (×3): via INTRAVENOUS
  Administered 2014-12-29: 1 mL via INTRAVENOUS
  Administered 2014-12-30: 05:00:00 via INTRAVENOUS

## 2014-12-28 MED ORDER — IBUPROFEN 400 MG PO TABS
200.0000 mg | ORAL_TABLET | Freq: Four times a day (QID) | ORAL | Status: DC | PRN
  Administered 2014-12-28 – 2014-12-29 (×3): 200 mg via ORAL
  Filled 2014-12-28 (×3): qty 1

## 2014-12-28 MED ORDER — INSULIN ASPART 100 UNIT/ML ~~LOC~~ SOLN
3.0000 [IU] | Freq: Three times a day (TID) | SUBCUTANEOUS | Status: DC
  Administered 2014-12-28 – 2014-12-30 (×7): 3 [IU] via SUBCUTANEOUS

## 2014-12-28 MED ORDER — ZOLPIDEM TARTRATE 5 MG PO TABS
5.0000 mg | ORAL_TABLET | Freq: Every evening | ORAL | Status: DC | PRN
  Administered 2014-12-29 – 2014-12-30 (×2): 5 mg via ORAL
  Filled 2014-12-28 (×2): qty 1

## 2014-12-28 MED ORDER — INSULIN ASPART 100 UNIT/ML ~~LOC~~ SOLN
0.0000 [IU] | Freq: Three times a day (TID) | SUBCUTANEOUS | Status: DC
  Administered 2014-12-28: 5 [IU] via SUBCUTANEOUS
  Administered 2014-12-28: 3 [IU] via SUBCUTANEOUS
  Administered 2014-12-29: 5 [IU] via SUBCUTANEOUS
  Administered 2014-12-29: 3 [IU] via SUBCUTANEOUS

## 2014-12-28 MED ORDER — INSULIN ASPART 100 UNIT/ML ~~LOC~~ SOLN
0.0000 [IU] | Freq: Every day | SUBCUTANEOUS | Status: DC
  Administered 2014-12-28: 2 [IU] via SUBCUTANEOUS

## 2014-12-28 NOTE — Progress Notes (Signed)
UR chart review completed.  

## 2014-12-28 NOTE — Clinical Social Work Note (Signed)
Clinical Social Work Assessment  Patient Details  Name: Billy Stone MRN: [id number] Date of Birth: 1950/11/25  Date of referral:  12/28/14               Reason for consult:  Facility Placement                Permission sought to share information with:  Family Supports Permission granted to share information::  Yes, Verbal Permission Granted  Name::     Water engineer::     Relationship::  Environmental manager Information:     Housing/Transportation Living arrangements for the past 2 months:  [place] of Information:  Patient, Other (Comment Required) (brother) Patient Interpreter Needed:  None Criminal Activity/Legal Involvement Pertinent to Current Situation/Hospitalization:  No - Comment as needed Significant Relationships:  Siblings Lives with:  Siblings Do you feel safe going back to the place where you live?  Yes Need for family participation in patient care:  Yes (Comment) (If pt returns home from hospital, he will most likely require additional assistance.)  Care giving concerns:  Pt has been at SNF, but will require additional assist from brother if returns home.    Social Worker assessment / plan:  CSW met with pt and pt's brother, Laverna Peace at bedside. Pt alert and oriented and reports that he has been a resident at [facility] for a week and a half. He indicates that his rehab has gone very well so far and he is making good progress. His initial plan was to stay 20 days. However, pt began discussing possibility of returning home from hospital. Pt states he has tried to just take it one day at a time so he had not thought about it too much. He feels that PT evaluation in hospital would help him make this decision. CM notified. He was living with his brother prior to [facility]. Laverna Peace appears to be involved and supportive. He states that he is home most of the time with pt and agrees that it would be good if pt was able to go home from hospital. Per Marianna Fuss at facility, pt is  okay to return and no FL2 needed. Awaiting PT evaluation for recommendations.   Employment status:  Retired Forensic scientist:  Medicare PT Recommendations:  Not assessed at this time (waiting on evaluation for recommendations) Information / Referral to community resources:  [place]  Patient/Family's Response to care:  Pt requests PT evaluation to help with decision regarding return to SNF vs home at d/c.   Patient/Family's Understanding of and Emotional Response to Diagnosis, Current Treatment, and Prognosis:  Pt appears understanding of current treatment. He is hopeful that PT will recommend that he can return home from hospital, but is also willing to return to [facility] to complete rehab if needed.   Emotional Assessment Appearance:  Well-Groomed, Appears stated age Attitude/Demeanor/Rapport:  Other Affect (typically observed):  Hopeful, Appropriate Orientation:  Oriented to Self, Oriented to Place, Oriented to  Time, Oriented to Situation Alcohol / Substance use:  Not Applicable Psych involvement (Current and /or in the community):  No (Comment)  Discharge Needs  Concerns to be addressed:  Discharge Planning Concerns Readmission within the last 30 days:  Yes Current discharge risk:  Physical Impairment Barriers to Discharge:  Continued Medical Work up   Salome Arnt, [place] 12/28/2014, 9:46 AM [phone]

## 2014-12-28 NOTE — Evaluation (Signed)
Physical Therapy Evaluation Patient Details Name: Billy Stone MRN: [id number] DOB: 03/18/1951 Today's Date: 12/28/2014   History of Present Illness  Billy Stone is an 64 y.o. 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 He was found to have hyponatremia and hyperglycemia. He was recently admitted and discharged about a week ago, and hadn't drunk any alcohol. Evaluation in the ER showed Na of 120, with prior Na of 128 two weeks ago. He also has BS in the 400's, and K of 5.5. He had admitted to drinking more water, and took his 2 salt tablets per day. He denied HA, blurry vx, neurological symptoms, but admitted to feeling weak. He was given Kayexalate, and hospitalist was asked to admit him for acute hyponatremia, with hx of SIADH, hyerkalemia, and hyperglycemia on steroids.  Clinical Impression  Pt is admitted from SNF.  He has progressed well there and is currently independent with transfers and gait with a walker for functional distances.  He is still deconditioned.  I definitely think that he could return home at d/c with HHPT.  He should continue to use a rolling walker for gait.    Follow Up Recommendations Home health PT    Equipment Recommendations  Rolling walker with 5" wheels    Recommendations for Other Services   none    Precautions / Restrictions Precautions Precautions: Fall Restrictions Weight Bearing Restrictions: No      Mobility  Bed Mobility Overal bed mobility: Independent       Supine to sit: Independent        Transfers Overall transfer level: Independent   Transfers: Sit to/from Stand Sit to Stand: Independent Stand pivot transfers: Independent          Ambulation/Gait Ambulation/Gait assistance: Supervision Ambulation Distance (Feet): 150 Feet Assistive device: Rolling walker (2 wheeled) Gait Pattern/deviations: WFL(Within  Functional Limits)   Gait velocity interpretation: at or above normal speed for age/gender    Stairs            Wheelchair Mobility    Modified Rankin (Stroke Patients Only)       Balance Overall balance assessment: No apparent balance deficits (not formally assessed)                                           Pertinent Vitals/Pain Pain Assessment: No/denies pain    Home Living Family/patient expects to be discharged to:: Private residence Living Arrangements: Other relatives Available Help at Discharge: Available 24 hours/day Type of Home: House Home Access: Level entry     Home Layout: One level Home Equipment: Cane - quad;Cane - single point Additional Comments: Tub shower    Prior Function Level of Independence: Needs assistance   Gait / Transfers Assistance Needed: pt ambulates with a walker at SNF,  trying to progress to a cane  ADL's / Homemaking Assistance Needed: from [place] Hand: Right    Extremity/Trunk Assessment   Upper Extremity Assessment: Overall WFL for tasks assessed           Lower Extremity Assessment: Overall WFL for tasks assessed      Cervical / Trunk Assessment: Normal  Communication   Communication: No difficulties  Cognition Arousal/Alertness: Awake/alert Behavior During Therapy: [facility]  for tasks assessed/performed Overall Cognitive Status: History of cognitive impairments - at baseline                      General Comments      Exercises        Assessment/Plan    PT Assessment All further PT needs can be met in the next venue of care  PT Diagnosis  (deconditioned)   PT Problem List Decreased activity tolerance;Decreased mobility  PT Treatment Interventions     PT Goals (Current goals can be found in the Care Plan section) Acute Rehab PT Goals PT Goal Formulation: All assessment and education complete, DC therapy    Frequency     Barriers to  discharge        Co-evaluation               End of Session Equipment Utilized During Treatment: Gait belt Activity Tolerance: Patient tolerated treatment well Patient left: in chair;with chair alarm set;with call bell/phone within reach Nurse Communication: Mobility status         Time: 0029-8473 PT Time Calculation (min) (ACUTE ONLY): 22 min   Charges:   PT Evaluation $Initial PT Evaluation Tier I: 1 Procedure     PT G CodesOwens Shark, Channon Brougher L 12/28/2014, 11:22 AM

## 2014-12-28 NOTE — Clinical Social Work Note (Signed)
PT evaluation has been completed and pt is happy that he can return home from hospital. [facility]. CM aware of equipment needs. CSW will sign off, but can be reconsulted if needed.  Derenda FennelKara Kenidi Elenbaas, [place]CS[place] [phone]

## 2014-12-28 NOTE — Care Management Note (Signed)
Case Management Note  Patient Details  Name: Billy Stone MRN: [id number] Date of Birth: Feb 17, 1951  Subjective/Objective:                  PT admitted from [facility] with hyperkalemia and hyponatremia. Anticipate discharge back to facility at discharge.  Action/Plan: Pt would like reeval by PT. Pt stated if he is able to walk he will discharge home and not to SNF. Will continue to follow for discharge planning needs. CSW is aware of pt.  Expected Discharge Date:  12/31/14               Expected Discharge Plan:  Skilled Nursing Facility  In-House Referral:  Clinical Social Work  Discharge planning Services  CM Consult  Post Acute Care Choice:    Choice offered to:     DME Arranged:    DME Agency:     HH Arranged:    HH Agency:     Status of Service:     Medicare Important Message Given:    Date Medicare IM Given:    Medicare IM give by:    Date Additional Medicare IM Given:    Additional Medicare Important Message give by:     If discussed at Long Length of Stay Meetings, dates discussed:    Additional Comments:  Cheryl FlashBlackwell, [place] Holdren Crowder, RN 12/28/2014, 11:06 AM

## 2014-12-28 NOTE — Progress Notes (Addendum)
Nutrition Brief Note  Patient identified on the Malnutrition Screening Tool (MST) Report. Pt presented to ED with hyponatremia.   .Body mass index is 28.52 kg/(m^2). Patient meets criteria for overweight based on current BMI. Pt has significant weight decrease which is likely related to fluid changes. His current weight is 171.6# which is a decrease of 10% in 10 days.  His appetite and meal intake are very good.Current diet order is CHO Modified, patient is consuming  approximately 75-100% of meals at this time. Labs and medications reviewed. He has been evaluated by PT and is planning to return home with home health services.  No additional nutrition interventions warranted at this time.  Will continue with Ensure Enlive po BID, each supplement provides 350 kcal and 20 grams of protein.  Royann ShiversLynn Daeshawn Redmann MS,RD,CSG,LDN Office: [phone] Pager: [phone]

## 2014-12-28 NOTE — Progress Notes (Signed)
[facility] PROGRESS NOTE   Assessment/Plan: Hyperglycemia: - Blood glucose is improving significantly, with  sliding scale insulin and low-dose Lantus - Pending A1c.  Hyponatremia Hypovolemia: - On admission he was started on IV fluids and Lasix with improvement in his sodium, Lasix and IV fluids were stopped and basic metabolic panel showed worsening of his sodium. So I will go ahead and restart IV fluids -  Check a basic metabolic panel in the morning. - Urinary sodium, urine osmolarity were not checked on admission.  Weakness generalized: - Likely due to hyponatremia will get a physical therapy consult.  Cirrhosis of liver: - Hold Aldactone.  Essential hypertension - Bp seems stable. - Continue current regimen.   Code Status: Full Family Communication: none Disposition Plan: inpatient   Consultants:  none  Procedures:  CXR  Antibiotics:  none (indicate start date, and stop date if known)  HPI/Subjective: Relates he has had several BM.  Objective: Filed Vitals:   12/27/14 1930 12/27/14 2115 12/28/14 0448 12/28/14 0743  BP:  117/57 126/66   Pulse:  52 66   Temp:  98.2 F (36.8 C) 98.3 F (36.8 C)   TempSrc:  Oral Oral   Resp:  17 19   Height:      Weight:      SpO2: 95% 97% 98% 96%    Intake/Output Summary (Last 24 hours) at 12/28/14 1031 Last data filed at 12/28/14 0451  Gross per 24 hour  Intake    600 ml  Output   2125 ml  Net  -1525 ml   Filed Weights   12/26/14 2043 12/27/14 0055  Weight: 82.555 kg (182 lb) 77.747 kg (171 lb 6.4 oz)    Exam:  General: Alert, awake, oriented x3, in no acute distress.  HEENT: No bruits, no goiter.  Heart: Regular rate and rhythm. Lungs: Good air movement, clear Abdomen: Soft, nontender, nondistended, positive bowel sounds.  Neuro: Grossly intact, nonfocal.   Data Reviewed: Basic Metabolic Panel:  Recent Labs Lab 12/23/14 0750 12/26/14 1632 12/26/14 2154 12/27/14 0632  12/28/14 0926  NA 127* 120* 120* 127* 126*  K 4.6 5.5* 5.8* 4.7 4.1  CL 95* 91* 92* 96* 93*  CO2 24 22 21 22 23   GLUCOSE 124* 434* 486* 191* 170*  BUN 19 23 25* 26* 30*  CREATININE 0.63 0.71 0.77 0.63 0.74  CALCIUM 8.7 8.6 8.7 8.6* 8.8*   Liver Function Tests:  Recent Labs Lab 12/23/14 0750 12/26/14 2154 12/27/14 0632  AST 168* 110* 89*  ALT 76* 84* 75*  ALKPHOS 212* 269* 220*  BILITOT 4.2* 3.0* 3.1*  PROT 6.7 7.4 7.1  ALBUMIN 2.3* 2.7* 2.6*   No results for input(s): LIPASE, AMYLASE in the last 168 hours. No results for input(s): AMMONIA in the last 168 hours. CBC:  Recent Labs Lab 12/23/14 0750 12/26/14 1632 12/26/14 2154  WBC 28.7* 22.8* 21.1*  NEUTROABS 21.7* 19.4* 17.9*  HGB 12.2* 12.4* 12.4*  HCT 35.4* 35.7* 36.2*  MCV 102.6* 104.7* 105.2*  PLT 575* 580* 598*   Cardiac Enzymes: No results for input(s): CKTOTAL, CKMB, CKMBINDEX, TROPONINI in the last 168 hours. BNP (last 3 results)  Recent Labs  12/14/14 0125  BNP 250.0*    ProBNP (last 3 results)  Recent Labs  04/25/14 0620  PROBNP 324.5*    CBG:  Recent Labs Lab 12/27/14 1650 12/27/14 2013 12/28/14 0014 12/28/14 0446 12/28/14 0759  GLUCAP 288* 173* 175* 106* 160*    No results found for this  or any previous visit (from the past 240 hour(s)).   Studies: Dg Chest 2 View  12/26/2014   CLINICAL DATA:  Abnormal labs.  Bronchitis.  Hypertension.  EXAM: CHEST  2 VIEW  COMPARISON:  12/16/2014  FINDINGS: Mild hyperinflation. Midline trachea. Borderline cardiomegaly. Atherosclerosis in the transverse aorta. No pleural effusion or pneumothorax. Clear lungs. No congestive failure. Remote right rib trauma.  IMPRESSION: Hyperinflation, without acute disease.   Electronically Signed   By: Jeronimo Greaves M.D.   On: 12/26/2014 22:33    Scheduled Meds: . aspirin EC  81 mg Oral Daily  . feeding supplement (ENSURE ENLIVE)  237 mL Oral BID BM  . folic acid  1 mg Oral Daily  . furosemide  20 mg Oral  BID  . heparin  5,000 Units Subcutaneous 3 times per day  . insulin aspart  0-20 Units Subcutaneous 6 times per day  . insulin detemir  5 Units Subcutaneous BID  . ipratropium-albuterol  3 mL Nebulization TID  . lactulose  20 g Oral BID  . omega-3 acid ethyl esters  1 g Oral Daily  . pantoprazole  40 mg Oral Daily  . prednisoLONE  15 mg Oral Daily  . propranolol  10 mg Oral BID  . sodium chloride  3 mL Intravenous Q12H   Continuous Infusions: . sodium chloride      Time Spent: 25 min   [name]  [facility] Pager [phone]. If 7PM-7AM, please contact night-coverage at [email or web address], password [facility] 12/28/2014, 10:31 AM  LOS: 2 days

## 2014-12-29 DIAGNOSIS — D72829 Elevated white blood cell count, unspecified: Secondary | ICD-10-CM

## 2014-12-29 DIAGNOSIS — K717 Toxic liver disease with fibrosis and cirrhosis of liver: Secondary | ICD-10-CM

## 2014-12-29 LAB — GLUCOSE, CAPILLARY
GLUCOSE-CAPILLARY: 115 mg/dL — AB (ref 70–99)
GLUCOSE-CAPILLARY: 159 mg/dL — AB (ref 70–99)
Glucose-Capillary: 210 mg/dL — ABNORMAL HIGH (ref 70–99)
Glucose-Capillary: 142 mg/dL — ABNORMAL HIGH (ref 70–99)

## 2014-12-29 LAB — BASIC METABOLIC PANEL
ANION GAP: 7 (ref 5–15)
BUN: 22 mg/dL — ABNORMAL HIGH (ref 6–20)
CALCIUM: 8.5 mg/dL — AB (ref 8.9–10.3)
CO2: 23 mmol/L (ref 22–32)
Chloride: 97 mmol/L — ABNORMAL LOW (ref 101–111)
Creatinine, Ser: 0.49 mg/dL — ABNORMAL LOW (ref 0.61–1.24)
GFR calc Af Amer: >60 mL/min (ref >60)
GFR calc non Af Amer: >60 mL/min (ref >60)
GLUCOSE: 112 mg/dL — AB (ref 70–99)
Potassium: 4.3 mmol/L (ref 3.5–5.1)
SODIUM: 127 mmol/L — AB (ref 135–145)

## 2014-12-29 NOTE — Progress Notes (Signed)
Billy Stone PROGRESS NOTE  Billy Stone NFA:[id number] DOB: June 29, 1951 DOA: 12/26/2014 PCP: Billy Pizza, MD  Assessment/Plan: 64 y/o male with PMH of HTN, BPH (on self catheterization at home) Alcoholism, alcoholic hepatitis on prednisone recently discharged presented with weakness -admitted with Hyponatremia, hyperglycemia  1. Hyponatremia likely due to diuretics/hypovolemia on top of chronic hyponatremia due to cirrhosis  -Na improving. Will cont NS for 24 hrs. discontinued lasix  Weakness generalized: - Likely due to hyponatremia will get a physical therapy consult.  2. Alcoholic cirrhosis of liver. Alcoholic hepatitis on prednisone-taper, holding Aldactone.counceled to stop using etoh  3. Essential hypertension. Bp seems stable. Continue current regimen. 4. Leukocytosis thought due to steroids. Afebrile. Will check urine culture due to h/o UTI, and self cath at home  5. Hyperglycemia. Due to steroids.  Blood glucose is improving significantly, with sliding scale insulin and low-dose Lantus. ha1c-5.9    Code Status: full Family Communication: d/w patient (indicate person spoken with, relationship, and if by phone, the number) Disposition Plan: home 24-48 hrs    Consultants:  noen  Procedures:  none  Antibiotics:  none (indicate start date, and stop date if known)  HPI/Subjective: Alert, no distress   Objective: Filed Vitals:   12/29/14 0540  BP: 125/75  Pulse: 69  Temp: 98.7 F (37.1 C)  Resp: 20    Intake/Output Summary (Last 24 hours) at 12/29/14 1350 Last data filed at 12/29/14 0908  Gross per 24 hour  Intake   2220 ml  Output   3175 ml  Net   -955 ml   Filed Weights   12/26/14 2043 12/27/14 0055  Weight: 82.555 kg (182 lb) 77.747 kg (171 lb 6.4 oz)    Exam:   General:  Alert, denies chest pains   Cardiovascular: s1,s2 rrr  Respiratory: CTA BL  Abdomen: soft, nt,nd   Musculoskeletal: no leg edema   Data Reviewed: Basic  Metabolic Panel:  Recent Labs Lab 12/26/14 1632 12/26/14 2154 12/27/14 0632 12/28/14 0926 12/29/14 0548  NA 120* 120* 127* 126* 127*  K 5.5* 5.8* 4.7 4.1 4.3  CL 91* 92* 96* 93* 97*  CO2 GLUCOSE 434* 486* 191* 170* 112*  BUN 23 25* 26* 30* 22*  CREATININE 0.71 0.77 0.63 0.74 0.49*  CALCIUM 8.6 8.7 8.6* 8.8* 8.5*   Liver Function Tests:  Recent Labs Lab 12/23/14 0750 12/26/14 2154 12/27/14 0632  AST 168* 110* 89*  ALT 76* 84* 75*  ALKPHOS 212* 269* 220*  BILITOT 4.2* 3.0* 3.1*  PROT 6.7 7.4 7.1  ALBUMIN 2.3* 2.7* 2.6*   No results for input(s): LIPASE, AMYLASE in the last 168 hours. No results for input(s): AMMONIA in the last 168 hours. CBC:  Recent Labs Lab 12/23/14 0750 12/26/14 1632 12/26/14 2154  WBC 28.7* 22.8* 21.1*  NEUTROABS 21.7* 19.4* 17.9*  HGB 12.2* 12.4* 12.4*  HCT 35.4* 35.7* 36.2*  MCV 102.6* 104.7* 105.2*  PLT 575* 580* 598*   Cardiac Enzymes: No results for input(s): CKTOTAL, CKMB, CKMBINDEX, TROPONINI in the last 168 hours. BNP (last 3 results)  Recent Labs  12/14/14 0125  BNP 250.0*    ProBNP (last 3 results)  Recent Labs  04/25/14 0620  PROBNP 324.5*    CBG:  Recent Labs Lab 12/28/14 1217 12/28/14 1640 12/28/14 2108 12/29/14 0730 12/29/14 1133  GLUCAP 153* 239* 220* 115* 159*    No results found for this or any previous visit (from the past 240 hour(s)).   Studies:  No results found.  Scheduled Meds: . aspirin EC  81 mg Oral Daily  . feeding supplement (ENSURE ENLIVE)  237 mL Oral BID BM  . folic acid  1 mg Oral Daily  . heparin  5,000 Units Subcutaneous 3 times per day  . insulin aspart  0-15 Units Subcutaneous TID WC  . insulin aspart  0-5 Units Subcutaneous QHS  . insulin aspart  3 Units Subcutaneous TID WC  . insulin detemir  5 Units Subcutaneous BID  . ipratropium-albuterol  3 mL Nebulization TID  . lactulose  20 g Oral BID  . omega-3 acid ethyl esters  1 g Oral Daily  . pantoprazole   40 mg Oral Daily  . prednisoLONE  15 mg Oral Daily  . propranolol  10 mg Oral BID  . sodium chloride  3 mL Intravenous Q12H   Continuous Infusions: . sodium chloride 1 mL (12/29/14 0512)    Principal Problem:   Hyponatremia syndrome Active Problems:   GLUCOSE INTOLERANCE   Essential hypertension   Cirrhosis of liver   Weakness generalized   Hyponatremia   Hyperglycemia    Time spent: >35 minutes     Billy Stone, Billy Stone  Billy Stone Pager [phone]. If 7PM-7AM, please contact night-coverage at [email or web address], password [email or web address] 12/29/2014, 1:50 PM  LOS: 3 days

## 2014-12-29 NOTE — Care Management Note (Signed)
Case Management Note  Patient Details  Name: Romualdo BolkWalter L Nalepa MRN: [id number] Date of Birth: [birth year]  Subjective/Objective:                    Action/Plan:   Expected Discharge Date:  12/31/14               Expected Discharge Plan:  Skilled Nursing Facility  In-House Referral:  Clinical Social Work  Discharge planning Services  CM Consult  Post Acute Care Choice:  Durable Medical Equipment, Home Health Choice offered to:  Patient  DME Arranged:  Walker rolling DME Agency:  [facility]  HH Arranged:  PT HH Agency:  [facility]  Status of Service:  Completed, signed off  Medicare Important Message Given:    Date Medicare IM Given:    Medicare IM give by:    Date Additional Medicare IM Given:    Additional Medicare Important Message give by:     If discussed at Long Length of Stay Meetings, dates discussed:    Additional Comments: PT recommended that pt could return home at discharge instead of SNF. Pt is agreeable and will discharge home with his brother. [facility] Alroy BailiffLinda Lothian [facility] is aware and will collect pts information from the chart. Hh services to start within 48 hours of discharge. PT does recommend rolling walker and [facility]. [facility]. Pt and pts nurse aware of discharge arrangements.  Arlyss QueenBlackwell, Elyza Whitt Pikevillerowder, RN 12/29/2014, 12:14 PM

## 2014-12-29 NOTE — Progress Notes (Signed)
Ambien given per patient request for sleep.  Resting in bed watching TV,, reports general weakness, Foley intact with amber urine.

## 2014-12-29 NOTE — Care Management Note (Deleted)
Case Management Note  Patient Details  Name: Billy Stone MRN: [id number] Date of Birth: 1951/01/23  Subjective/Objective:                    Action/Plan:   Expected Discharge Date:  12/31/14               Expected Discharge Plan:  Skilled Nursing Facility  In-House Referral:  Clinical Social Work  Discharge planning Services  CM Consult  Post Acute Care Choice:    Choice offered to:     DME Arranged:    DME Agency:     HH Arranged:    HH Agency:     Status of Service:     Medicare Important Message Given:    Date Medicare IM Given:    Medicare IM give by:    Date Additional Medicare IM Given:    Additional Medicare Important Message give by:     If discussed at Long Length of Stay Meetings, dates discussed:    Additional Comments:  Billy Stone, Billy Krahenbuhl Crowder, RN 12/29/2014, 11:48 AM

## 2014-12-30 DIAGNOSIS — K7460 Unspecified cirrhosis of liver: Secondary | ICD-10-CM

## 2014-12-30 DIAGNOSIS — I10 Essential (primary) hypertension: Secondary | ICD-10-CM

## 2014-12-30 DIAGNOSIS — R531 Weakness: Secondary | ICD-10-CM

## 2014-12-30 LAB — BASIC METABOLIC PANEL
ANION GAP: 9 (ref 5–15)
BUN: 18 mg/dL (ref 6–20)
CHLORIDE: 98 mmol/L — AB (ref 101–111)
CO2: 22 mmol/L (ref 22–32)
Calcium: 8.7 mg/dL — ABNORMAL LOW (ref 8.9–10.3)
Creatinine, Ser: 0.54 mg/dL — ABNORMAL LOW (ref 0.61–1.24)
GFR calc non Af Amer: >60 mL/min (ref >60)
Glucose, Bld: 143 mg/dL — ABNORMAL HIGH (ref 70–99)
POTASSIUM: 4.3 mmol/L (ref 3.5–5.1)
SODIUM: 129 mmol/L — AB (ref 135–145)

## 2014-12-30 LAB — GLUCOSE, CAPILLARY: Glucose-Capillary: 110 mg/dL — ABNORMAL HIGH (ref 70–99)

## 2014-12-30 MED ORDER — FUROSEMIDE 20 MG PO TABS: 40.0000 mg | ORAL_TABLET | Freq: Every day | ORAL | Status: DC | PRN

## 2014-12-30 NOTE — Care Management Note (Signed)
Case Management Note  Patient Details  Name: Romualdo BolkWalter L Boxx MRN: [id number] Date of Birth: 06-13-1951  Subjective/Objective:                    Action/Plan:   Expected Discharge Date:  12/31/14               Expected Discharge Plan:  Home w Home Health Services  In-House Referral:  Clinical Social Work  Discharge planning Services  CM Consult  Post Acute Care Choice:  Durable Medical Equipment, Home Health Choice offered to:  Patient  DME Arranged:  Walker rolling DME Agency:  [facility]  HH [facility]  [facility]  Status of Service:  Completed, signed off  Medicare Important Message Given:  Yes Date Medicare IM Given:  12/30/14 Medicare IM give by:  Arlyss Queenammy Soumya Colson, RN BSN CM Date Additional Medicare IM Given:    Additional Medicare Important Message give by:     If discussed at Long Length of Stay Meetings, dates discussed:    Additional Comments: [facility] and will collect the pts information from the chart. HH services to start within 48 hours of discharge. [facility]. Pt and pts nurse aware of discharge arrangements. Arlyss QueenBlackwell, Arjun Hard Comptonrowder, RN 12/30/2014, 4:00 PM

## 2014-12-30 NOTE — Progress Notes (Signed)
1618 D/C instructions, paperwork and hard Rx given to patient and patient's brother. Foley catheter removed, no s/s of infection noted. IV catheter removed from RIGHT FA, catheter tip intact, no s/s of infection noted. Walker at bedside to go home with patient. Patient assisted to vehicle via w/c by staff.

## 2014-12-30 NOTE — Discharge Summary (Signed)
Physician Discharge Summary  Billy Stone ZOX:[id number] DOB: 1951-06-21 DOA: 12/26/2014  PCP: Catalina Pizza, MD  Admit date: 12/26/2014 Discharge date: 12/30/2014  Time spent: 40 minutes  Recommendations for Outpatient Follow-up:  1. Follow up with primary care physician in 1-2 weeks 2. Repeat BMET on primary follow up  Discharge Diagnoses:  Principal Problem:   Hyponatremia syndrome Active Problems:   GLUCOSE INTOLERANCE   Essential hypertension   Cirrhosis of liver   Weakness generalized   Hyponatremia   Hyperglycemia   Discharge Condition: improved  Diet recommendation: low salt  Filed Weights   12/26/14 2043 12/27/14 0055  Weight: 82.555 kg (182 lb) 77.747 kg (171 lb 6.4 oz)    History of present illness:  This is a 64 year old gentleman with a history of SIADH, alcoholic hepatitis and cirrhosis, was sent to the emergency room from skilled nursing facility rehabilitation for abnormal labs. He was found to be hyponatremic and hyperglycemic. Here blood sugars in the 400, potassium of 5.5 and a sodium of 120. He was admitted for further treatments.  Hospital Course:  Patient was recently discharged from the hospital after being treated for alcoholic hepatitis. He was found to have volume overload at that time including lower extremity edema and ascites. He was started on oral Lasix and spironolactone. The patient's hyponatremia was likely related to volume depletion from diuretics. His diuretics were held on admission and he was started on IV fluids. With normal saline infusion, his serum sodium has improved to 129. We'll change his Lasix to as needed for edema/ascites. The remainder of his blood work remains stable. Patient is feeling significantly improved and is ambulating without any difficulty. He was seen by physical therapy and felt appropriate for discharge home for further outpatient physical therapy. He appears to be at his baseline level of functioning and is ready for  discharge home.  Procedures:    Consultations:    Discharge Exam: Filed Vitals:   12/30/14 0719  BP: 134/58  Pulse: 78  Temp: 97.7 F (36.5 C)  Resp: 20    General: NAD Cardiovascular: s1, s2, rrr Respiratory: cta b  Discharge Instructions   Discharge Instructions    Diet - low sodium heart healthy    Complete by:  As directed      Face-to-face encounter (required for Medicare/Medicaid patients)    Complete by:  As directed   I MEMON,JEHANZEB certify that this patient is under my care and that I, or a nurse practitioner or physician's assistant working with me, had a face-to-face encounter that meets the physician face-to-face encounter requirements with this patient on 12/30/2014. The encounter with the patient was in whole, or in part for the following medical condition(s) which is the primary reason for home health care (List medical condition): hyponatremia, generalized weakness  The encounter with the patient was in whole, or in part, for the following medical condition, which is the primary reason for home health care:  hyponatremia, generlized weakness  I certify that, based on my findings, the following services are medically necessary home health services:  Physical therapy  Reason for Medically Necessary Home Health Services:  Therapy- Therapeutic Exercises to Increase Strength and Endurance  My clinical findings support the need for the above services:  Unsafe ambulation due to balance issues  Further, I certify that my clinical findings support that this patient is homebound due to:  Unable to leave home safely without assistance     Home Health    Complete by:  As directed   To provide the following care/treatments:  PT     Increase activity slowly    Complete by:  As directed           Discharge Medication List as of 12/30/2014  4:05 PM    CONTINUE these medications which have CHANGED   Details  furosemide (LASIX) 20 MG tablet Take 2 tablets (40 mg total)  by mouth daily as needed for fluid or edema., Starting 12/30/2014, Until Discontinued, Print      CONTINUE these medications which have NOT CHANGED   Details  albuterol (PROVENTIL HFA;VENTOLIN HFA) 108 (90 BASE) MCG/ACT inhaler Inhale 2 puffs into the lungs every 6 (six) hours as needed for wheezing or shortness of breath., Starting 04/26/2014, Until Discontinued, Print    aspirin 81 MG tablet Take 81 mg by mouth daily., Until Discontinued, Historical Med    B Complex-C (SUPER B COMPLEX PO) Take 1 tablet by mouth daily., Until Discontinued, Historical Med    chlorpheniramine (EQ CHLORTABS) 4 MG tablet Take 4 mg by mouth every 4 (four) hours as needed for allergies., Until Discontinued, Historical Med    feeding supplement, ENSURE ENLIVE, (ENSURE ENLIVE) LIQD Take 237 mLs by mouth 2 (two) times daily between meals., Starting 12/17/2014, Until Discontinued, Normal    fluticasone (FLONASE) 50 MCG/ACT nasal spray Place 1 spray into both nostrils daily., Starting 04/26/2014, Until Discontinued, Print    folic acid (FOLVITE) 1 MG tablet Take 1 tablet (1 mg total) by mouth daily., Starting 12/17/2014, Until Discontinued, No Print    hydrocortisone (ANUSOL-HC) 2.5 % rectal cream Place rectally 2 (two) times daily., Starting 12/17/2014, Until Discontinued, Normal    ipratropium-albuterol (DUONEB) 0.5-2.5 (3) MG/3ML SOLN Take 3 mLs by nebulization 3 (three) times daily., Starting 12/17/2014, Until Discontinued, No Print    lactulose (CHRONULAC) 10 GM/15ML solution Take 30 mLs (20 g total) by mouth 2 (two) times daily., Starting 12/17/2014, Until Discontinued, Normal    Miconazole Nitrate (ALOE VESTA ANTIFUNGAL EX) Place 1 application rectally 3 (three) times daily., Until Discontinued, Historical Med    nitroGLYCERIN (NITROSTAT) 0.4 MG SL tablet Place 1 tablet (0.4 mg total) under the tongue every 5 (five) minutes as needed for chest pain., Starting 06/05/2014, Until Discontinued, Normal    Omega-3 Fatty  Acids (FISH OIL) 1000 MG CAPS Take 2,000 mg by mouth daily. 2-3 tabs per day, Until Discontinued, Historical Med    prednisoLONE (PRELONE) 15 MG/5ML SOLN Take 13.3 mLs (40 mg total) by mouth daily. Until 5/19, Starting 12/17/2014, Until Discontinued, No Print    propranolol (INDERAL) 10 MG tablet Take 1 tablet (10 mg total) by mouth 2 (two) times daily., Starting 12/17/2014, Until Discontinued, No Print    pantoprazole (PROTONIX) 40 MG tablet Take 1 tablet (40 mg total) by mouth daily before breakfast., Starting 12/17/2014, Until Discontinued, No Print      STOP taking these medications     ibuprofen (ADVIL,MOTRIN) 400 MG tablet      omeprazole (PRILOSEC) 20 MG capsule      potassium chloride SA (K-DUR,KLOR-CON) 20 MEQ tablet      spironolactone (ALDACTONE) 25 MG tablet        Allergies  Allergen Reactions  . Amlodipine Besylate     REACTION: Feet swell  . Penicillins Other (See Comments)    Childhood allergy   Follow-up Information    Follow up with [facility].   Contact information:   [address] [place] [address] [phone]  The results of significant diagnostics from this hospitalization (including imaging, microbiology, ancillary and laboratory) are listed below for reference.    Significant Diagnostic Studies: Dg Chest 2 View  12/26/2014   CLINICAL DATA:  Abnormal labs.  Bronchitis.  Hypertension.  EXAM: CHEST  2 VIEW  COMPARISON:  12/16/2014  FINDINGS: Mild hyperinflation. Midline trachea. Borderline cardiomegaly. Atherosclerosis in the transverse aorta. No pleural effusion or pneumothorax. Clear lungs. No congestive failure. Remote right rib trauma.  IMPRESSION: Hyperinflation, without acute disease.   Electronically Signed   By: Jeronimo GreavesKyle  Talbot M.D.   On: 12/26/2014 22:33   [place] Abdomen Complete  12/14/2014   CLINICAL DATA:  Ascites for 1 week.  EXAM: ULTRASOUND ABDOMEN COMPLETE  COMPARISON:  None.  FINDINGS: Gallbladder: The  gallbladder wall appears thickened measuring 9.8 mm. Sludge is identified within the lumen of the gallbladder. No sonographic Murphy's sign.  Common bile duct: Diameter: 4.3 mm.  Liver: The liver appears diffusely heterogeneous and echogenic with a nodular contour.  IVC: No abnormality visualized.  Pancreas: Not visualized  Spleen: Not visualized due to overlying bowel gas.  Right Kidney: Length: 12.2 cm. Echogenicity within normal limits. No mass or hydronephrosis visualized.  Left Kidney: Length: 13 cm. Echogenicity within normal limits. No mass or hydronephrosis visualized.  Abdominal aorta: Not visualized due to overlying bowel gas.  Other findings: Trace ascites noted.  IMPRESSION: 1. Morphologic features of the liver which are suggestive of cirrhosis. 2. Gallbladder wall thickening and gallbladder sludge. In the setting of ascites and cirrhosis the gallbladder wall thickening may be a nonspecific finding. Correlate for any clinical signs or symptoms of cholecystitis.   Electronically Signed   By: Signa Kellaylor  Stroud M.D.   On: 12/14/2014 09:05   [place] Venous Img Lower Bilateral  12/16/2014   CLINICAL DATA:  Bilateral lower extremity edema, left greater than right. History of smoking. Evaluate for DVT.  EXAM: BILATERAL LOWER EXTREMITY VENOUS DOPPLER ULTRASOUND  TECHNIQUE: Gray-scale sonography with graded compression, as well as color Doppler and duplex ultrasound were performed to evaluate the lower extremity deep venous systems from the level of the common femoral vein and including the common femoral, femoral, profunda femoral, popliteal and calf veins including the posterior tibial, peroneal and gastrocnemius veins when visible. The superficial great saphenous vein was also interrogated. Spectral Doppler was utilized to evaluate flow at rest and with distal augmentation maneuvers in the common femoral, femoral and popliteal veins.  COMPARISON:  None.  FINDINGS: RIGHT LOWER EXTREMITY  Common Femoral Vein: No  evidence of thrombus. Normal compressibility, respiratory phasicity and response to augmentation.  Saphenofemoral Junction: No evidence of thrombus. Normal compressibility and flow on color Doppler imaging.  Profunda Femoral Vein: No evidence of thrombus. Normal compressibility and flow on color Doppler imaging.  Femoral Vein: No evidence of thrombus. Normal compressibility, respiratory phasicity and response to augmentation.  Popliteal Vein: No evidence of thrombus. Normal compressibility, respiratory phasicity and response to augmentation.  Calf Veins: No evidence of thrombus. Normal compressibility and flow on color Doppler imaging.  Superficial Great Saphenous Vein: No evidence of thrombus. Normal compressibility and flow on color Doppler imaging.  Venous Reflux:  None.  Other Findings:  None.  LEFT LOWER EXTREMITY  Common Femoral Vein: No evidence of thrombus. Normal compressibility, respiratory phasicity and response to augmentation.  Saphenofemoral Junction: No evidence of thrombus. Normal compressibility and flow on color Doppler imaging.  Profunda Femoral Vein: No evidence of thrombus. Normal compressibility and flow on color Doppler imaging.  Femoral Vein:  No evidence of thrombus. Normal compressibility, respiratory phasicity and response to augmentation.  Popliteal Vein: No evidence of thrombus. Normal compressibility, respiratory phasicity and response to augmentation.  Calf Veins: No evidence of thrombus. Normal compressibility and flow on color Doppler imaging.  Superficial Great Saphenous Vein: No evidence of thrombus. Normal compressibility and flow on color Doppler imaging.  Venous Reflux:  None.  Other Findings:  None.  IMPRESSION: No evidence of DVT within either lower extremity.   Electronically Signed   By: Simonne Come M.D.   On: 12/16/2014 15:05   Dg Chest Port 1 View  12/16/2014   CLINICAL DATA:  Swelling all over.  EXAM: PORTABLE CHEST - 1 VIEW  COMPARISON:  12/14/2014  FINDINGS: Heart is  upper limits normal in size. Mild vascular congestion. No confluent opacities or effusions. No acute bony abnormality.  IMPRESSION: Mild vascular congestion.   Electronically Signed   By: Charlett Nose M.D.   On: 12/16/2014 15:30   Dg Chest Portable 1 View  12/14/2014   CLINICAL DATA:  Initial evaluation for acute weakness, fatigue.  EXAM: PORTABLE CHEST - 1 VIEW  COMPARISON:  Prior radiograph from 04/25/2014  FINDINGS: The cardiac and mediastinal silhouettes are stable in size and contour, and remain within normal limits.  The lungs are hypoinflated. No airspace consolidation, pleural effusion, or pulmonary edema is identified. There is no pneumothorax.  Remote right-sided rib fractures again noted. No acute osseous abnormality.  IMPRESSION: Shallow lung inflation with no acute cardiopulmonary abnormality identified.   Electronically Signed   By: Rise Mu M.D.   On: 12/14/2014 02:13    Microbiology: Recent Results (from the past 240 hour(s))  Culture, Urine     Status: None (Preliminary result)   Collection Time: 12/29/14  2:34 PM  Result Value Ref Range Status   Specimen Description URINE, CLEAN CATCH  Final   Special Requests NONE  Final   Culture PENDING  Incomplete   Report Status PENDING  Incomplete     Labs: Basic Metabolic Panel:  Recent Labs Lab 12/26/14 2154 12/27/14 0632 12/28/14 0926 12/29/14 0548 12/30/14 0541  NA 120* 127* 126* 127* 129*  K 5.8* 4.7 4.1 4.3 4.3  CL 92* 96* 93* 97* 98*  CO2 GLUCOSE 486* 191* 170* 112* 143*  BUN 25* 26* 30* 22* 18  CREATININE 0.77 0.63 0.74 0.49* 0.54*  CALCIUM 8.7 8.6* 8.8* 8.5* 8.7*   Liver Function Tests:  Recent Labs Lab 12/26/14 2154 12/27/14 0632  AST 110* 89*  ALT 84* 75*  ALKPHOS 269* 220*  BILITOT 3.0* 3.1*  PROT 7.4 7.1  ALBUMIN 2.7* 2.6*   No results for input(s): LIPASE, AMYLASE in the last 168 hours. No results for input(s): AMMONIA in the last 168 hours. CBC:  Recent Labs Lab  12/26/14 1632 12/26/14 2154  WBC 22.8* 21.1*  NEUTROABS 19.4* 17.9*  HGB 12.4* 12.4*  HCT 35.7* 36.2*  MCV 104.7* 105.2*  PLT 580* 598*   Cardiac Enzymes: No results for input(s): CKTOTAL, CKMB, CKMBINDEX, TROPONINI in the last 168 hours. BNP: BNP (last 3 results)  Recent Labs  12/14/14 0125  BNP 250.0*    ProBNP (last 3 results)  Recent Labs  04/25/14 0620  PROBNP 324.5*    CBG:  Recent Labs Lab 12/29/14 0730 12/29/14 1133 12/29/14 1602 12/29/14 2055 12/30/14 0803  GLUCAP 115* 159* 210* 142* 110*       Signed:  MEMON,JEHANZEB  [facility] 12/30/2014, 7:53 PM

## 2014-12-31 NOTE — Progress Notes (Signed)
UR chart review completed.  

## 2015-01-01 ENCOUNTER — Other Ambulatory Visit: Payer: Self-pay | Admitting: Internal Medicine

## 2015-01-01 DIAGNOSIS — E222 Syndrome of inappropriate secretion of antidiuretic hormone: Secondary | ICD-10-CM | POA: Diagnosis not present

## 2015-01-01 DIAGNOSIS — I10 Essential (primary) hypertension: Secondary | ICD-10-CM | POA: Diagnosis not present

## 2015-01-01 DIAGNOSIS — K759 Inflammatory liver disease, unspecified: Secondary | ICD-10-CM | POA: Diagnosis not present

## 2015-01-01 DIAGNOSIS — R262 Difficulty in walking, not elsewhere classified: Secondary | ICD-10-CM | POA: Diagnosis not present

## 2015-01-01 DIAGNOSIS — K7030 Alcoholic cirrhosis of liver without ascites: Secondary | ICD-10-CM | POA: Diagnosis not present

## 2015-01-01 LAB — URINE CULTURE: Colony Count: >=100000

## 2015-01-16 IMAGING — US US ABDOMEN LIMITED
1 series · 14 of 25 positions shown · non-contrast
Comparison: [DATE]

CLINICAL DATA: Hepatic cirrhosis of unspecified type, followup,
history of coronary artery disease, essential hypertension, history
alcoholism, former smoker

EXAM:
US ABDOMEN LIMITED - RIGHT UPPER QUADRANT

[Series 1: us abdomen limited · 0.24mm/px · 14 of 59 slices shown]
[im 1/59]
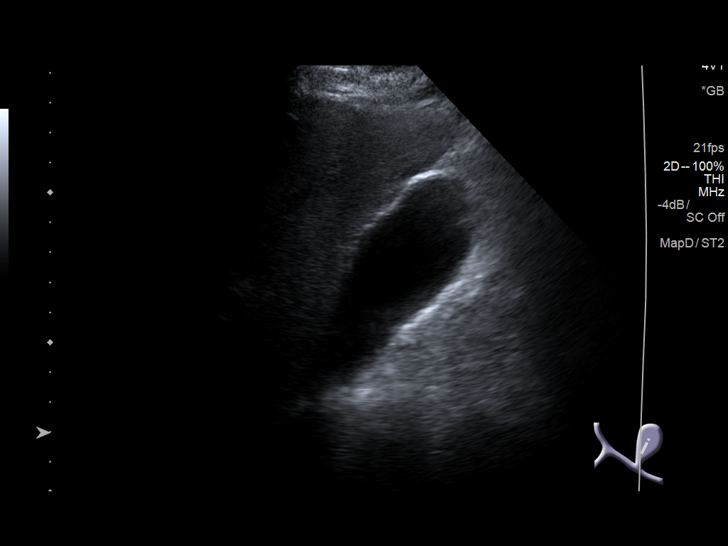
[im 5/59]
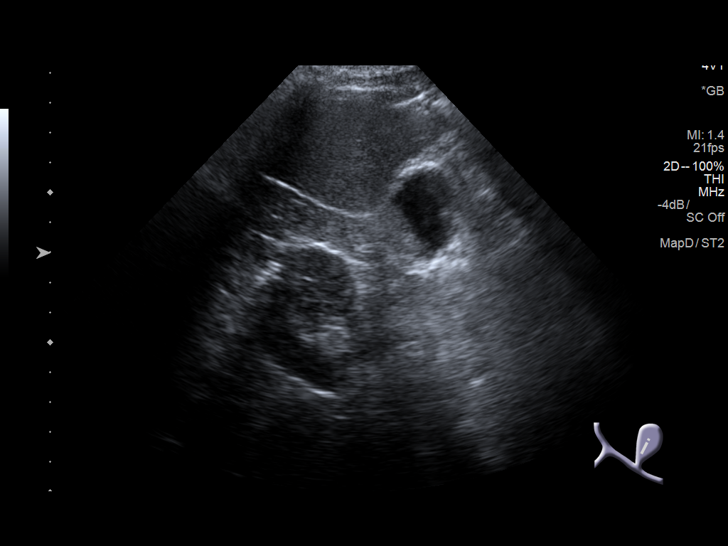
[im 10/59]
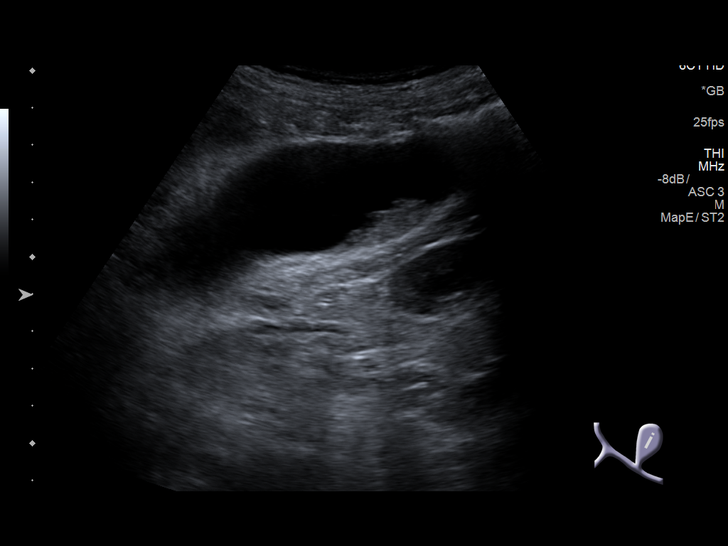
[im 15/59]
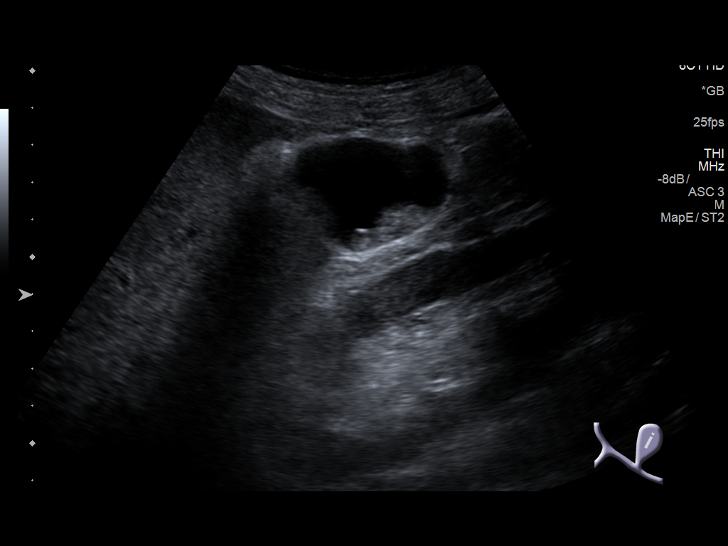
[im 20/59]
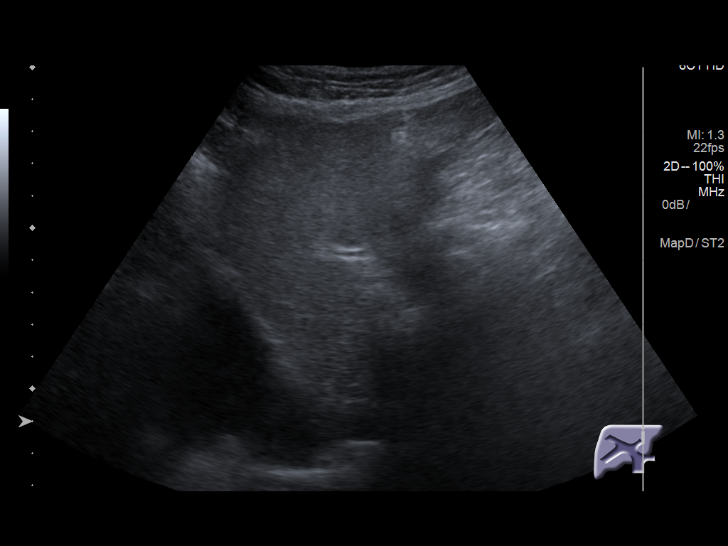
[im 22/59]
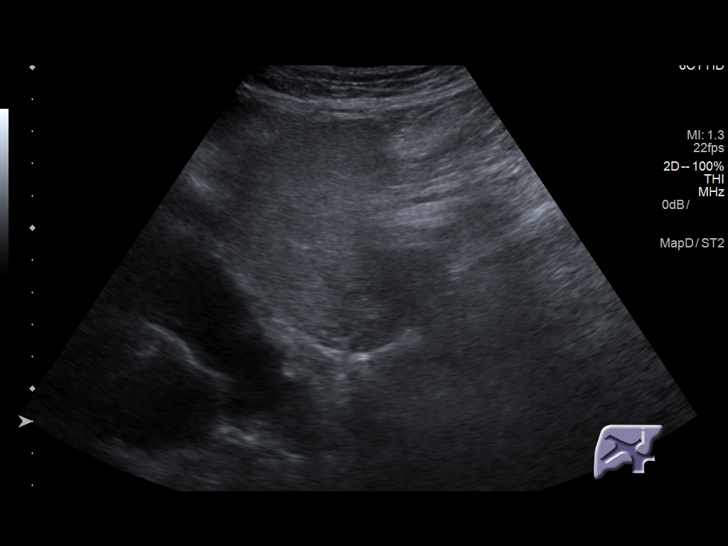
[im 27/59]
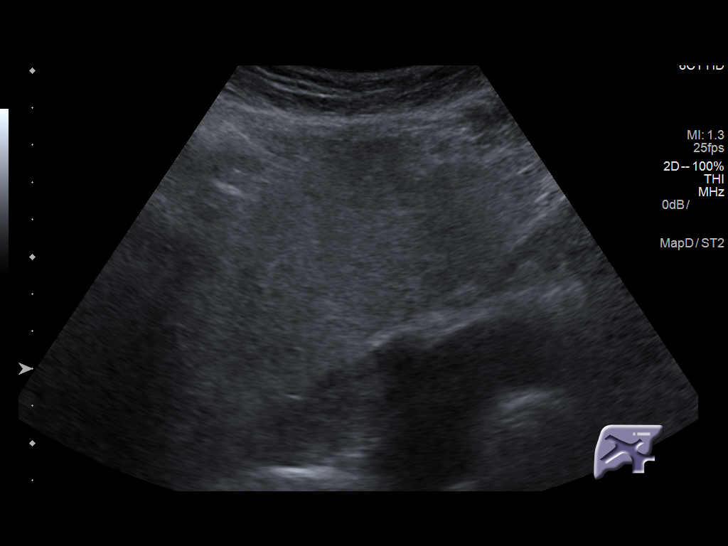
[im 32/59]
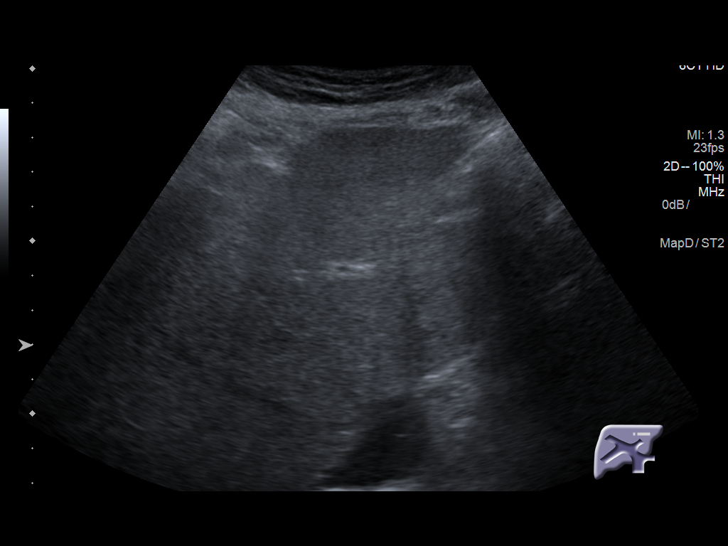
[im 37/59]
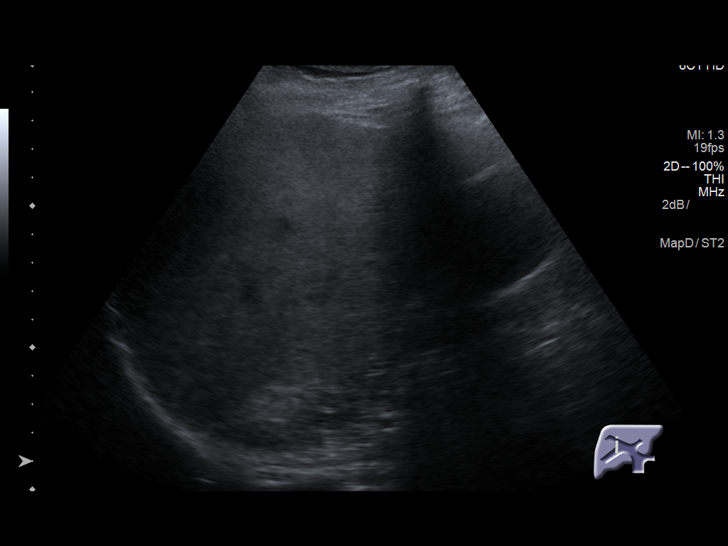
[im 39/59]
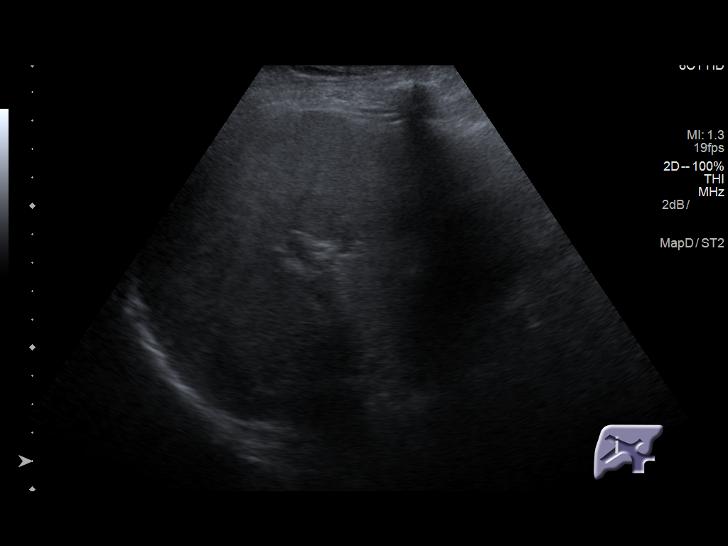
[im 44/59]
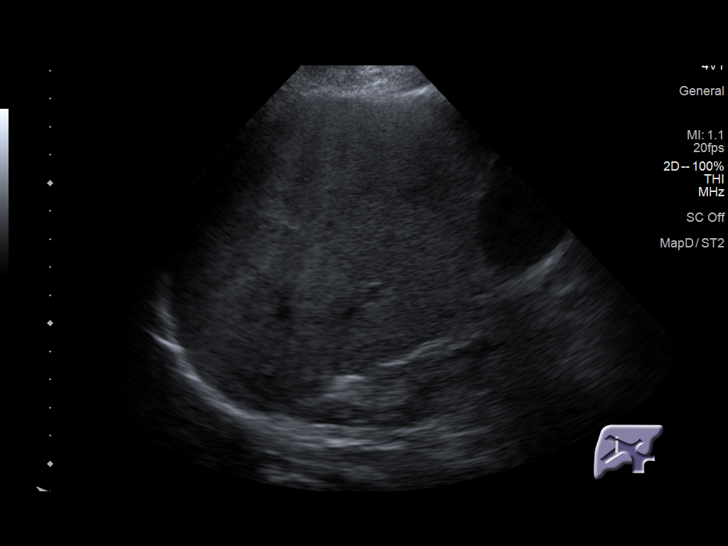
[im 49/59]
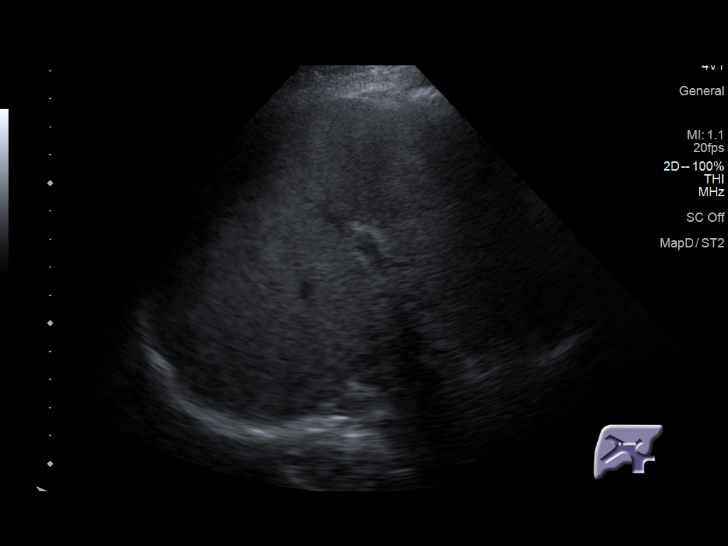
[im 54/59]
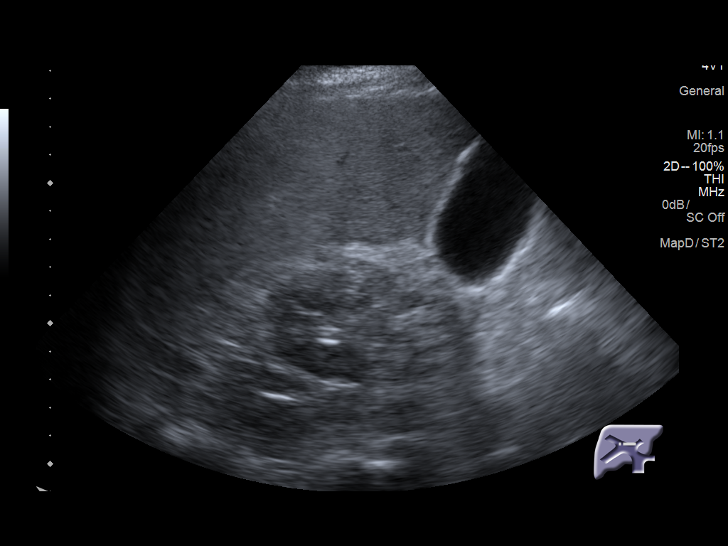
[im 59/59]
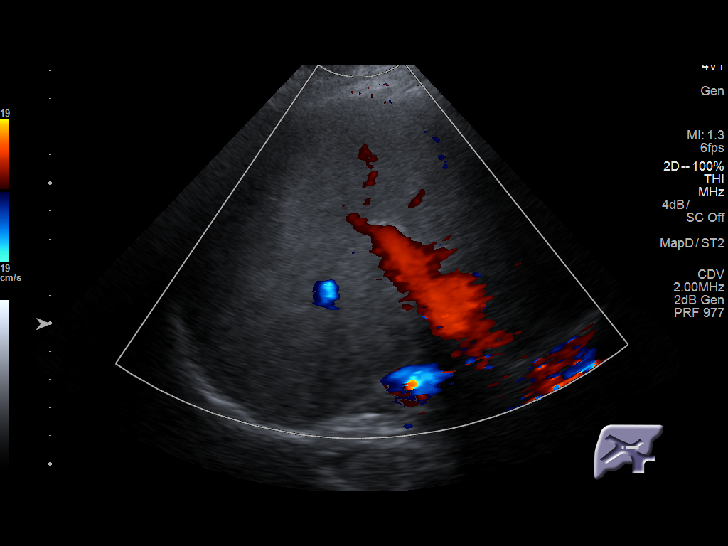

[14 of 25 positions shown; findings below may reference images not displayed]

FINDINGS: Gallbladder:

Mobile echogenic non shadowing material within gallbladder lumen
consistent with sludge. Normal gallbladder wall thickness. No
pericholecystic fluid or sonographic Murphy sign.

Common bile duct:

Diameter: 2 mm diameter, normal

Liver:

Echogenic mildly heterogeneous hepatic parenchyma, can be seen with
fatty infiltration and cirrhosis. No definite focal hepatic
nodularity or discrete mass. Hepatopetal portal venous flow.

No RIGHT upper quadrant free fluid.
IMPRESSION: Gallbladder sludge.

Heterogeneous increased echogenicity of the liver which can be seen
with fatty infiltration and cirrhosis.

## 2015-01-18 ENCOUNTER — Ambulatory Visit (INDEPENDENT_AMBULATORY_CARE_PROVIDER_SITE_OTHER): Payer: Medicare Other | Admitting: Gastroenterology

## 2015-01-18 ENCOUNTER — Other Ambulatory Visit: Payer: Self-pay

## 2015-01-18 ENCOUNTER — Encounter: Payer: Self-pay | Admitting: Gastroenterology

## 2015-01-18 VITALS — BP 111/68 | HR 68 | Temp 97.7°F | Ht 65.0 in | Wt 181.6 lb

## 2015-01-18 DIAGNOSIS — K7460 Unspecified cirrhosis of liver: Secondary | ICD-10-CM | POA: Diagnosis not present
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
## 2015-01-18 NOTE — Patient Instructions (Signed)
Please complete blood work today.   We have scheduled you for an upper endoscopy with Dr. Darrick PennaFields.

## 2015-01-19 LAB — CBC WITH DIFFERENTIAL/PLATELET
Basophils Absolute: 0 10*3/uL (ref 0.0–0.1)
Basophils Relative: 0 % (ref 0–1)
EOS ABS: 0.5 10*3/uL (ref 0.0–0.7)
EOS PCT: 4 % (ref 0–5)
HCT: 36.6 % — ABNORMAL LOW (ref 39.0–52.0)
Hemoglobin: 12.5 g/dL — ABNORMAL LOW (ref 13.0–17.0)
LYMPHS PCT: 61 % — AB (ref 12–46)
Lymphs Abs: 7.5 10*3/uL — ABNORMAL HIGH (ref 0.7–4.0)
MCH: 33.2 pg (ref 26.0–34.0)
MCHC: 34.2 g/dL (ref 30.0–36.0)
MCV: 97.3 fL (ref 78.0–100.0)
MONO ABS: 1.5 10*3/uL — AB (ref 0.1–1.0)
MONOS PCT: 12 % (ref 3–12)
MPV: 10.5 fL (ref 8.6–12.4)
NEUTROS PCT: 23 % — AB (ref 43–77)
Neutro Abs: 2.8 10*3/uL (ref 1.7–7.7)
Platelets: 528 10*3/uL — ABNORMAL HIGH (ref 150–400)
RBC: 3.76 MIL/uL — ABNORMAL LOW (ref 4.22–5.81)
RDW: 16.2 % — AB (ref 11.5–15.5)
WBC: 12.3 10*3/uL — ABNORMAL HIGH (ref 4.0–10.5)

## 2015-01-19 LAB — HEPATIC FUNCTION PANEL
ALT: 18 U/L (ref 0–53)
AST: 50 U/L — AB (ref 0–37)
Albumin: 2.9 g/dL — ABNORMAL LOW (ref 3.5–5.2)
Alkaline Phosphatase: 151 U/L — ABNORMAL HIGH (ref 39–117)
Bilirubin, Direct: 0.7 mg/dL — ABNORMAL HIGH (ref 0.0–0.3)
Indirect Bilirubin: 0.9 mg/dL (ref 0.2–1.2)
Total Bilirubin: 1.6 mg/dL — ABNORMAL HIGH (ref 0.2–1.2)
Total Protein: 7.1 g/dL (ref 6.0–8.3)

## 2015-01-19 LAB — HEPATITIS B CORE ANTIBODY, TOTAL: Hep B Core Total Ab: NONREACTIVE

## 2015-01-19 LAB — BASIC METABOLIC PANEL
BUN: 6 mg/dL (ref 6–23)
CALCIUM: 8.9 mg/dL (ref 8.4–10.5)
CO2: 27 mEq/L (ref 19–32)
Chloride: 95 mEq/L — ABNORMAL LOW (ref 96–112)
Creat: 0.58 mg/dL (ref 0.50–1.35)
Glucose, Bld: 92 mg/dL (ref 70–99)
Potassium: 3.6 mEq/L (ref 3.5–5.3)
Sodium: 135 mEq/L (ref 135–145)

## 2015-01-19 LAB — PATHOLOGIST SMEAR REVIEW

## 2015-01-19 LAB — PROTIME-INR
INR: 1.36 (ref <1.50)
Prothrombin Time: 16.8 seconds — ABNORMAL HIGH (ref 11.6–15.2)

## 2015-01-19 LAB — HEPATITIS A ANTIBODY, TOTAL: HEP A TOTAL AB: NONREACTIVE

## 2015-01-21 NOTE — Progress Notes (Signed)
CC'ED TO PCP 

## 2015-01-21 NOTE — Assessment & Plan Note (Signed)
64 year old male admitted with ETOH hepatitis and noted to have findings of likely cirrhosis on recent US in April 2016; he has clinically improved. Unfortunately, appears somewhat confused regarding medication regimen and never filled prednisolone for outpatient treatment. Needs EGD for variceal screening. Next US abdomen due in Oct 2016. Will recheck CBC, CMP, and INR now. No need to resume prednisolone unless persistent evidence of elevated discriminant function. Check immune status to Hep A and B. As of note, NO ALCOHOL since discharge from hospital. He is DECLINING routine screening colonoscopy. No concerning lower GI symptoms.   Proceed with upper endoscopy in the near future with Dr. Darrick PennaFields. The risks, benefits, and alternatives have been discussed in detail with patient. They have stated understanding and desire to proceed.  PROPOFOL due to ETOH history  CBC, CMP, INR

## 2015-01-26 DIAGNOSIS — I10 Essential (primary) hypertension: Secondary | ICD-10-CM | POA: Diagnosis not present

## 2015-01-26 DIAGNOSIS — K759 Inflammatory liver disease, unspecified: Secondary | ICD-10-CM | POA: Diagnosis not present

## 2015-01-26 DIAGNOSIS — E222 Syndrome of inappropriate secretion of antidiuretic hormone: Secondary | ICD-10-CM | POA: Diagnosis not present

## 2015-01-26 DIAGNOSIS — K7030 Alcoholic cirrhosis of liver without ascites: Secondary | ICD-10-CM | POA: Diagnosis not present

## 2015-01-29 NOTE — Progress Notes (Signed)
Quick Note:  Labs reviewed. Due to abnormal differential on CBC, pathologist smear review was done. He has lymphocytosis and atypical lymphocytes. Needs to have this lab done: peripheral blood flow cytometry with phenotype. This needs to probably be written out on a special order for [facility], may need to call them to see how to order it. LFTs improved, INR improved. Needs Hep A and B vaccinations. ______

## 2015-02-01 ENCOUNTER — Other Ambulatory Visit: Payer: Self-pay

## 2015-02-01 ENCOUNTER — Other Ambulatory Visit: Payer: Self-pay | Admitting: Gastroenterology

## 2015-02-01 DIAGNOSIS — D72820 Lymphocytosis (symptomatic): Secondary | ICD-10-CM

## 2015-02-01 NOTE — Progress Notes (Signed)
Quick Note:  I called [facility] and spoke to [place] who said the code for the special test will be 82700. I put the miscellaneous order in and [facility] and informed pt and he will try to go tomorrow for the lab.  He is aware he needs the Hep A and Hep B vaccinations. ______

## 2015-02-04 LAB — IMMUNOPHENOTYPING BY FLOW CYTOMETRY

## 2015-02-04 LAB — REFLEX BLAST SCREEN

## 2015-02-09 NOTE — Patient Instructions (Signed)
Billy Stone  02/09/2015       Your procedure is scheduled on Tuesday, 02/16/15.  Report to [facility] at 0900 A.M.  Call this number if you have problems the morning of surgery:  [phone]   Remember:  Do not eat food or drink liquids after midnight.  Take these medicines the morning of surgery with A SIP OF WATER metoprolol   Do not wear jewelry, make-up or nail polish.  Do not wear lotions, powders, or perfumes.  You may wear deodorant.  Do not shave 48 hours prior to surgery.  Men may shave face and neck.  Do not bring valuables to the hospital.  [facility] is not responsible for any belongings or valuables.  Contacts, dentures or bridgework may not be worn into surgery.  Leave your suitcase in the car.  After surgery it may be brought to your room.  For patients admitted to the hospital, discharge time will be determined by your treatment team.  Patients discharged the day of surgery will not be allowed to drive home.   Name and phone number of your driver:   family Special instructions:  Follow any special instructions given to you by Dr. Evelina Dun or office staff.  Please read over the following fact sheets that you were given. Anesthesia Post-op Instructions and Care and Recovery After Surgery      Esophagogastroduodenoscopy Care After Refer to this sheet in the next few weeks. These instructions provide you with information on caring for yourself after your procedure. Your caregiver may also give you more specific instructions. Your treatment has been planned according to current medical practices, but problems sometimes occur. Call your caregiver if you have any problems or questions after your procedure.  HOME CARE INSTRUCTIONS  Do not eat or drink anything until the numbing medicine (local anesthetic) has worn off and your gag reflex has returned. You will know that the local anesthetic has worn off when you can swallow comfortably.  Do not drive for 12  hours after the procedure or as directed by your caregiver.  Only take medicines as directed by your caregiver. SEEK MEDICAL CARE IF:   You cannot stop coughing.  You are not urinating at all or less than usual. SEEK IMMEDIATE MEDICAL CARE IF:  You have difficulty swallowing.  You cannot eat or drink.  You have worsening throat or chest pain.  You have dizziness, lightheadedness, or you faint.  You have nausea or vomiting.  You have chills.  You have a fever.  You have severe abdominal pain.  You have black, tarry, or bloody stools. Document Released: 07/31/2012 Document Reviewed: 07/31/2012 [facility] Patient Information 2015 [place], [place]. This information is not intended to replace advice given to you by your health care provider. Make sure you discuss any questions you have with your health care provider. Esophagogastroduodenoscopy Esophagogastroduodenoscopy (EGD) is a procedure to examine the lining of the esophagus, stomach, and first part of the small intestine (duodenum). A long, flexible, lighted tube with a camera attached (endoscope) is inserted down the throat to view these organs. This procedure is done to detect problems or abnormalities, such as inflammation, bleeding, ulcers, or growths, in order to treat them. The procedure lasts about 5-20 minutes. It is usually an outpatient procedure, but it may need to be performed in emergency cases in the hospital. LET YOUR CAREGIVER KNOW ABOUT:   Allergies to food or medicine.  All medicines you are taking, including vitamins, herbs, eyedrops, and  over-the-counter medicines and creams.  Use of steroids (by mouth or creams).  Previous problems you or members of your family have had with the use of anesthetics.  Any blood disorders you have.  Previous surgeries you have had.  Other health problems you have.  Possibility of pregnancy, if this applies. RISKS AND COMPLICATIONS  Generally, EGD is a safe procedure.  However, as with any procedure, complications can occur. Possible complications include:  Infection.  Bleeding.  Tearing (perforation) of the esophagus, stomach, or duodenum.  Difficulty breathing or not being able to breath.  Excessive sweating.  Spasms of the larynx.  Slowed heartbeat.  Low blood pressure. BEFORE THE PROCEDURE  Do not eat or drink anything for 6-8 hours before the procedure or as directed by your caregiver.  Ask your caregiver about changing or stopping your regular medicines.  If you wear dentures, be prepared to remove them before the procedure.  Arrange for someone to drive you home after the procedure. PROCEDURE   A vein will be accessed to give medicines and fluids. A medicine to relax you (sedative) and a pain reliever will be given through that access into the vein.  A numbing medicine (local anesthetic) may be sprayed on your throat for comfort and to stop you from gagging or coughing.  A mouth guard may be placed in your mouth to protect your teeth and to keep you from biting on the endoscope.  You will be asked to lie on your left side.  The endoscope is inserted down your throat and into the esophagus, stomach, and duodenum.  Air is put through the endoscope to allow your caregiver to view the lining of your esophagus clearly.  The esophagus, stomach, and duodenum is then examined. During the exam, your caregiver may:  Remove tissue to be examined under a microscope (biopsy) for inflammation, infection, or other medical problems.  Remove growths.  Remove objects (foreign bodies) that are stuck.  Treat any bleeding with medicines or other devices that stop tissues from bleeding (hot cautery, clipping devices).  Widen (dilate) or stretch narrowed areas of the esophagus and stomach.  The endoscope will then be withdrawn. AFTER THE PROCEDURE  You will be taken to a recovery area to be monitored. You will be able to go home once you are  stable and alert.  Do not eat or drink anything until the local anesthetic and numbing medicines have worn off. You may choke.  It is normal to feel bloated, have pain with swallowing, or have a sore throat for a short time. This will wear off.  Your caregiver should be able to discuss his or her findings with you. It will take longer to discuss the test results if any biopsies were taken. Document Released: 12/15/2004 Document Revised: 12/29/2013 Document Reviewed: 07/17/2012 [facility] Patient Information 2015 [place], [place]. This information is not intended to replace advice given to you by your health care provider. Make sure you discuss any questions you have with your health care provider. Monitored Anesthesia Care Monitored anesthesia care is an anesthesia service for a medical procedure. Anesthesia is the loss of the ability to feel pain. It is produced by medicines called anesthetics. It may affect a small area of your body (local anesthesia), a large area of your body (regional anesthesia), or your entire body (general anesthesia). The need for monitored anesthesia care depends your procedure, your condition, and the potential need for regional or general anesthesia. It is often provided during procedures where:  General anesthesia may be needed if there are complications. This is because you need special care when you are under general anesthesia.   You will be under local or regional anesthesia. This is so that you are able to have higher levels of anesthesia if needed.   You will receive calming medicines (sedatives). This is especially the case if sedatives are given to put you in a semi-conscious state of relaxation (deep sedation). This is because the amount of sedative needed to produce this state can be hard to predict. Too much of a sedative can produce general anesthesia. Monitored anesthesia care is performed by one or more health care providers who have special training in  all types of anesthesia. You will need to meet with these health care providers before your procedure. During this meeting, they will ask you about your medical history. They will also give you instructions to follow. (For example, you will need to stop eating and drinking before your procedure. You may also need to stop or change medicines you are taking.) During your procedure, your health care providers will stay with you. They will:   Watch your condition. This includes watching your blood pressure, breathing, and level of pain.   Diagnose and treat problems that occur.   Give medicines if they are needed. These may include calming medicines (sedatives) and anesthetics.   Make sure you are comfortable.  Having monitored anesthesia care does not necessarily mean that you will be under anesthesia. It does mean that your health care providers will be able to manage anesthesia if you need it or if it occurs. It also means that you will be able to have a different type of anesthesia than you are having if you need it. When your procedure is complete, your health care providers will continue to watch your condition. They will make sure any medicines wear off before you are allowed to go home.  Document Released: 05/10/2005 Document Revised: 12/29/2013 Document Reviewed: 09/25/2012 [facility] Patient Information 2015 [place], [place]. This information is not intended to replace advice given to you by your health care provider. Make sure you discuss any questions you have with your health care provider.

## 2015-02-10 ENCOUNTER — Encounter: Payer: Self-pay | Admitting: Cardiology

## 2015-02-10 ENCOUNTER — Ambulatory Visit (INDEPENDENT_AMBULATORY_CARE_PROVIDER_SITE_OTHER): Payer: Medicare Other | Admitting: Cardiology

## 2015-02-10 ENCOUNTER — Encounter (HOSPITAL_COMMUNITY)
Admission: RE | Admit: 2015-02-10 | Discharge: 2015-02-10 | Disposition: A | Discharge status: Home / Self Care | Payer: Medicare Other | Source: Ambulatory Visit | Attending: Gastroenterology | Admitting: Gastroenterology

## 2015-02-10 ENCOUNTER — Encounter (HOSPITAL_COMMUNITY): Payer: Self-pay

## 2015-02-10 VITALS — BP 110/72 | HR 75 | Ht 65.0 in | Wt 181.0 lb

## 2015-02-10 DIAGNOSIS — I2510 Atherosclerotic heart disease of native coronary artery without angina pectoris: Secondary | ICD-10-CM

## 2015-02-10 DIAGNOSIS — Z01818 Encounter for other preprocedural examination: Secondary | ICD-10-CM | POA: Insufficient documentation

## 2015-02-10 DIAGNOSIS — R002 Palpitations: Secondary | ICD-10-CM

## 2015-02-10 DIAGNOSIS — I10 Essential (primary) hypertension: Secondary | ICD-10-CM | POA: Diagnosis not present

## 2015-02-10 LAB — BASIC METABOLIC PANEL
ANION GAP: 10 (ref 5–15)
BUN: 6 mg/dL (ref 6–20)
CHLORIDE: 95 mmol/L — AB (ref 101–111)
CO2: 28 mmol/L (ref 22–32)
Calcium: 7.3 mg/dL — ABNORMAL LOW (ref 8.9–10.3)
Creatinine, Ser: 0.78 mg/dL (ref 0.61–1.24)
GFR calc Af Amer: >60 mL/min (ref >60)
GFR calc non Af Amer: >60 mL/min (ref >60)
Glucose, Bld: 138 mg/dL — ABNORMAL HIGH (ref 65–99)
POTASSIUM: 3 mmol/L — AB (ref 3.5–5.1)
SODIUM: 133 mmol/L — AB (ref 135–145)

## 2015-02-10 LAB — CBC
HCT: 37.5 % — ABNORMAL LOW (ref 39.0–52.0)
Hemoglobin: 12.9 g/dL — ABNORMAL LOW (ref 13.0–17.0)
MCH: 33.8 pg (ref 26.0–34.0)
MCHC: 34.4 g/dL (ref 30.0–36.0)
MCV: 98.2 fL (ref 78.0–100.0)
Platelets: 177 10*3/uL (ref 150–400)
RBC: 3.82 MIL/uL — AB (ref 4.22–5.81)
RDW: 16.5 % — ABNORMAL HIGH (ref 11.5–15.5)
WBC: 13.1 10*3/uL — AB (ref 4.0–10.5)

## 2015-02-10 NOTE — Patient Instructions (Addendum)
Your physician wants you to follow-up in:  6 MONTHS with Dr. Diona Browner. You will receive a reminder letter in the mail two months in advance. If you don't receive a letter, please call our office to schedule the follow-up appointment.   Take METOPROLOL 50 mg AM / 50 mg PM  .Thanks for choosing [facility]!!!
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
## 2015-02-11 MED ORDER — POTASSIUM CHLORIDE ER 20 MEQ PO TBCR: EXTENDED_RELEASE_TABLET | ORAL | Status: DC

## 2015-02-11 NOTE — Pre-Procedure Instructions (Addendum)
Call into Dr Margo Aye to manage long term potassium needs, per Dr Darrick Penna.  Contacted patient and advised him to take 40 meq K+ x 3 doses Q 3 hours and begin 1/2 dose of lasix starting 6/17 through procedure date of 6/21.  Patient verbalized understanding.  Order placed for ISTAT am of procedure.    6/17 - No response received from above request.  Will route note to Dr. Darrick Penna, as requested.

## 2015-02-11 NOTE — Progress Notes (Signed)
REVIEWED-Preop K 3.0-PLEASE CALL PT. HIS POTASSIUM IS LOW. HE NEEDS KCL 40 MeQ PO EVERY 8 HOURS x 3. RX SENT.  HE SHOULD TAKE HALF HIS LASIX DOSE STARTING JUN 17.

## 2015-02-11 NOTE — Addendum Note (Signed)
Addended by: Jonette Eva L on: 02/11/2015 11:30 AM   Modules accepted: Orders

## 2015-02-15 ENCOUNTER — Other Ambulatory Visit: Payer: Self-pay

## 2015-02-15 DIAGNOSIS — B199 Unspecified viral hepatitis without hepatic coma: Secondary | ICD-10-CM | POA: Diagnosis not present

## 2015-02-15 DIAGNOSIS — K7030 Alcoholic cirrhosis of liver without ascites: Secondary | ICD-10-CM | POA: Diagnosis not present

## 2015-02-15 DIAGNOSIS — D72820 Lymphocytosis (symptomatic): Secondary | ICD-10-CM

## 2015-02-15 NOTE — Progress Notes (Signed)
Patient needs referral to Hematology due to lymphocytosis.

## 2015-02-15 NOTE — Progress Notes (Signed)
Quick Note:  PT is aware. ______ 

## 2015-02-15 NOTE — Progress Notes (Signed)
Quick Note:  Reviewed briefly with Tom. He needs a referral to Hematology due to lymphocytosis. Please tell him that there are some abnormalities with his lymphocytes that needs to be further evaluated by a specialist. ______

## 2015-02-16 ENCOUNTER — Encounter (HOSPITAL_COMMUNITY)
Admission: RE | Disposition: A | Discharge status: Home / Self Care | Payer: Self-pay | Source: Ambulatory Visit | Attending: Gastroenterology

## 2015-02-16 ENCOUNTER — Ambulatory Visit (HOSPITAL_COMMUNITY)
Admission: RE | Admit: 2015-02-16 | Discharge: 2015-02-16 | Disposition: A | Discharge status: Home / Self Care | Payer: Medicare Other | Source: Ambulatory Visit | Attending: Gastroenterology | Admitting: Gastroenterology

## 2015-02-16 ENCOUNTER — Encounter (HOSPITAL_COMMUNITY): Payer: Self-pay | Admitting: Emergency Medicine

## 2015-02-16 ENCOUNTER — Ambulatory Visit (HOSPITAL_COMMUNITY): Payer: Medicare Other | Admitting: Anesthesiology

## 2015-02-16 DIAGNOSIS — D649 Anemia, unspecified: Secondary | ICD-10-CM | POA: Insufficient documentation

## 2015-02-16 DIAGNOSIS — K2980 Duodenitis without bleeding: Secondary | ICD-10-CM | POA: Insufficient documentation

## 2015-02-16 DIAGNOSIS — I2510 Atherosclerotic heart disease of native coronary artery without angina pectoris: Secondary | ICD-10-CM | POA: Diagnosis not present

## 2015-02-16 DIAGNOSIS — I10 Essential (primary) hypertension: Secondary | ICD-10-CM | POA: Insufficient documentation

## 2015-02-16 DIAGNOSIS — Z7982 Long term (current) use of aspirin: Secondary | ICD-10-CM | POA: Insufficient documentation

## 2015-02-16 DIAGNOSIS — Z87891 Personal history of nicotine dependence: Secondary | ICD-10-CM | POA: Diagnosis not present

## 2015-02-16 DIAGNOSIS — K2970 Gastritis, unspecified, without bleeding: Secondary | ICD-10-CM | POA: Diagnosis not present

## 2015-02-16 DIAGNOSIS — K2960 Other gastritis without bleeding: Secondary | ICD-10-CM | POA: Diagnosis not present

## 2015-02-16 DIAGNOSIS — K319 Disease of stomach and duodenum, unspecified: Secondary | ICD-10-CM | POA: Diagnosis not present

## 2015-02-16 DIAGNOSIS — Z1389 Encounter for screening for other disorder: Secondary | ICD-10-CM | POA: Diagnosis present

## 2015-02-16 DIAGNOSIS — K7460 Unspecified cirrhosis of liver: Secondary | ICD-10-CM | POA: Insufficient documentation

## 2015-02-16 DIAGNOSIS — K2950 Unspecified chronic gastritis without bleeding: Secondary | ICD-10-CM | POA: Diagnosis not present

## 2015-02-16 HISTORY — PX: ESOPHAGOGASTRODUODENOSCOPY (EGD) WITH PROPOFOL: SHX5813

## 2015-02-16 HISTORY — PX: BIOPSY: SHX5522

## 2015-02-16 LAB — BASIC METABOLIC PANEL
ANION GAP: 10 (ref 5–15)
BUN: <5 mg/dL — ABNORMAL LOW (ref 6–20)
CO2: 23 mmol/L (ref 22–32)
Calcium: 7.9 mg/dL — ABNORMAL LOW (ref 8.9–10.3)
Chloride: 105 mmol/L (ref 101–111)
Creatinine, Ser: 0.61 mg/dL (ref 0.61–1.24)
GFR calc Af Amer: >60 mL/min (ref >60)
GLUCOSE: 110 mg/dL — AB (ref 65–99)
Potassium: 4 mmol/L (ref 3.5–5.1)
Sodium: 138 mmol/L (ref 135–145)

## 2015-02-16 LAB — CBC WITH DIFFERENTIAL/PLATELET
BASOS ABS: 0 10*3/uL (ref 0.0–0.1)
Basophils Relative: 0 % (ref 0–1)
Eosinophils Absolute: 0.1 10*3/uL (ref 0.0–0.7)
Eosinophils Relative: 2 % (ref 0–5)
HEMATOCRIT: 25.9 % — AB (ref 39.0–52.0)
Hemoglobin: 8.8 g/dL — ABNORMAL LOW (ref 13.0–17.0)
LYMPHS ABS: 3.9 10*3/uL (ref 0.7–4.0)
LYMPHS PCT: 52 % — AB (ref 12–46)
MCH: 33.3 pg (ref 26.0–34.0)
MCHC: 34 g/dL (ref 30.0–36.0)
MCV: 98.1 fL (ref 78.0–100.0)
MONOS PCT: 14 % — AB (ref 3–12)
Monocytes Absolute: 1 10*3/uL (ref 0.1–1.0)
Neutro Abs: 2.5 10*3/uL (ref 1.7–7.7)
Neutrophils Relative %: 32 % — ABNORMAL LOW (ref 43–77)
Platelets: 188 10*3/uL (ref 150–400)
RBC: 2.64 MIL/uL — AB (ref 4.22–5.81)
RDW: 17 % — ABNORMAL HIGH (ref 11.5–15.5)
WBC: 7.5 10*3/uL (ref 4.0–10.5)

## 2015-02-16 SURGERY — ESOPHAGOGASTRODUODENOSCOPY (EGD) WITH PROPOFOL
Anesthesia: Monitor Anesthesia Care

## 2015-02-16 MED ORDER — LIDOCAINE HCL 1 % IJ SOLN
INTRAMUSCULAR | Status: DC | PRN
  Administered 2015-02-16: 10 mg via INTRADERMAL

## 2015-02-16 MED ORDER — ONDANSETRON HCL 4 MG/2ML IJ SOLN
4.0000 mg | Freq: Once | INTRAMUSCULAR | Status: AC
  Administered 2015-02-16: 4 mg via INTRAVENOUS
  Filled 2015-02-16: qty 2

## 2015-02-16 MED ORDER — MIDAZOLAM HCL 2 MG/2ML IJ SOLN
INTRAMUSCULAR | Status: AC
  Filled 2015-02-16: qty 2

## 2015-02-16 MED ORDER — SPIRONOLACTONE 50 MG PO TABS: 50.0000 mg | ORAL_TABLET | Freq: Every day | ORAL | Status: DC

## 2015-02-16 MED ORDER — PROPOFOL 10 MG/ML IV BOLUS
INTRAVENOUS | Status: AC
  Filled 2015-02-16: qty 20

## 2015-02-16 MED ORDER — PROPOFOL 10 MG/ML IV BOLUS
INTRAVENOUS | Status: DC | PRN
  Administered 2015-02-16 (×2): 10 mg via INTRAVENOUS

## 2015-02-16 MED ORDER — OMEPRAZOLE 20 MG PO CPDR: DELAYED_RELEASE_CAPSULE | ORAL | Status: DC

## 2015-02-16 MED ORDER — ONDANSETRON HCL 4 MG/2ML IJ SOLN: 4.0000 mg | Freq: Once | INTRAMUSCULAR | Status: DC | PRN

## 2015-02-16 MED ORDER — MIDAZOLAM HCL 5 MG/5ML IJ SOLN
INTRAMUSCULAR | Status: DC | PRN
  Administered 2015-02-16: 2 mg via INTRAVENOUS

## 2015-02-16 MED ORDER — PROPOFOL INFUSION 10 MG/ML OPTIME
INTRAVENOUS | Status: DC | PRN
  Administered 2015-02-16: 75 ug/kg/min via INTRAVENOUS

## 2015-02-16 MED ORDER — FENTANYL CITRATE (PF) 100 MCG/2ML IJ SOLN: 25.0000 ug | INTRAMUSCULAR | Status: DC | PRN

## 2015-02-16 MED ORDER — LACTATED RINGERS IV SOLN
INTRAVENOUS | Status: DC
  Administered 2015-02-16: 75 mL/h via INTRAVENOUS

## 2015-02-16 MED ORDER — FUROSEMIDE 20 MG PO TABS: 20.0000 mg | ORAL_TABLET | Freq: Every day | ORAL | Status: DC

## 2015-02-16 MED ORDER — FENTANYL CITRATE (PF) 100 MCG/2ML IJ SOLN
25.0000 ug | INTRAMUSCULAR | Status: AC
  Administered 2015-02-16 (×2): 25 ug via INTRAVENOUS
  Filled 2015-02-16: qty 2

## 2015-02-16 MED ORDER — MIDAZOLAM HCL 2 MG/2ML IJ SOLN
1.0000 mg | INTRAMUSCULAR | Status: DC | PRN
Start: 2015-02-16 — End: 2015-02-16
  Administered 2015-02-16: 2 mg via INTRAVENOUS
  Filled 2015-02-16: qty 2

## 2015-02-16 SURGICAL SUPPLY — 19 items
BLOCK BITE 60FR ADLT L/F BLUE (MISCELLANEOUS) ×3 IMPLANT
ELECT REM PT RETURN 9FT ADLT (ELECTROSURGICAL)
ELECTRODE REM PT RTRN 9FT ADLT (ELECTROSURGICAL) IMPLANT
FLOOR PAD 36X40 (MISCELLANEOUS) ×3
FORCEPS BIOP RAD 4 LRG CAP 4 (CUTTING FORCEPS) ×2 IMPLANT
FORMALIN 10 PREFIL 20ML (MISCELLANEOUS) IMPLANT
KIT ENDO PROCEDURE PEN (KITS) ×3 IMPLANT
MANIFOLD NEPTUNE II (INSTRUMENTS) ×3 IMPLANT
NEEDLE SCLEROTHERAPY 25GX240 (NEEDLE) IMPLANT
OVERTUBE ENDOCUFF GREEN (MISCELLANEOUS) IMPLANT
PAD FLOOR 36X40 (MISCELLANEOUS) ×1 IMPLANT
PROBE APC STR FIRE (PROBE) IMPLANT
PROBE INJECTION GOLD (MISCELLANEOUS)
PROBE INJECTION GOLD 7FR (MISCELLANEOUS) IMPLANT
SNARE SHORT THROW 13M SML OVAL (MISCELLANEOUS) IMPLANT
SYR INFLATION 60ML (SYRINGE) IMPLANT
TUBING INSUFFLATOR CO2MPACT (TUBING) ×3 IMPLANT
TUBING IRRIGATION ENDOGATOR (MISCELLANEOUS) ×3 IMPLANT
WATER STERILE IRR 1000ML POUR (IV SOLUTION) ×3 IMPLANT

## 2015-02-16 NOTE — Anesthesia Postprocedure Evaluation (Signed)
  Anesthesia Post-op Note  Patient: Billy Stone  Procedure(s) Performed: Procedure(s) with comments: ESOPHAGOGASTRODUODENOSCOPY (EGD) WITH PROPOFOL (N/A) - 1030  BIOPSY (N/A) - Gastric  Patient Location: PACU  Anesthesia Type:MAC  Level of Consciousness: awake  Airway and Oxygen Therapy: Patient Spontanous Breathing  Post-op Pain: none  Post-op Assessment: Post-op Vital signs reviewed, Patient's Cardiovascular Status Stable, Respiratory Function Stable, Patent Airway and No signs of Nausea or vomiting              Post-op Vital Signs: Reviewed and stable  Last Vitals:  Filed Vitals:   02/16/15 0945  BP: 128/76  Pulse:   Temp:   Resp: 20    Complications: No apparent anesthesia complications
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
## 2015-02-16 NOTE — Discharge Instructions (Signed)
You have gastritis & DUODENITIS DUE TO YOUR USING ASPIRIN & IBUPROFEN. I biopsied your stomach.   FOLLOW A HIGH FIBER DIET. AVOID ITEMS THAT CAUSE BLOATING & GAS. SEE INFO BELOW.  STRICTLY AVOID ASPIRIN, BC/GOODY POWDERS, IBUPROFEN/MOTRIN, OR NAPROXEN/ALEVE BECAUSE YOU HAVE A ULCERS in your small bowel. RE-START JUL 22.  START OMEPRAZOLE.  TAKE 30 MINUTES PRIOR TO BREAKFAST AND SUPPER TO HELP YOUR STOMACH AND SMALL BOWEL HEAL.  START ALDACTONE 50 MG WITH LASIX 20 MG DAILY TO CONTROL THE FLUID IN YOUR LEGS.  YOU CAN TAKE 1 OR 2 REGULAR STRENGTH TYLENOL EVERY 6 TO 8 HOURS AS NEEDED FOR PAIN. DO NOT TAKE MORE THAN 8 PILLS A DAY.  YOUR BIOPSY RESULTS WILL BE AVAILABLE IN MY CHART AFTER JUN 24 and MY OFFICE WILL CONTACT YOU IN 10-14 DAYS WITH YOUR RESULTS.    FOLLOW UP IN SEP 2016.   UPPER ENDOSCOPY AFTER CARE Read the instructions outlined below and refer to this sheet in the next week. These discharge instructions provide you with general information on caring for yourself after you leave the hospital. While your treatment has been planned according to the most current medical practices available, unavoidable complications occasionally occur. If you have any problems or questions after discharge, call DR. Maddilyn Campus, [phone].  ACTIVITY  You may resume your regular activity, but move at a slower pace for the next 24 hours.   Take frequent rest periods for the next 24 hours.   Walking will help get rid of the air and reduce the bloated feeling in your belly (abdomen).   No driving for 24 hours (because of the medicine (anesthesia) used during the test).   You may shower.   Do not sign any important legal documents or operate any machinery for 24 hours (because of the anesthesia used during the test).    NUTRITION  Drink plenty of fluids.   You may resume your normal diet as instructed by your doctor.   Begin with a light meal and progress to your normal diet. Heavy or fried  foods are harder to digest and may make you feel sick to your stomach (nauseated).   Avoid alcoholic beverages for 24 hours or as instructed.    MEDICATIONS  You may resume your normal medications.   WHAT YOU CAN EXPECT TODAY  Some feelings of bloating in the abdomen.   Passage of more gas than usual.    IF YOU HAD A BIOPSY TAKEN DURING THE UPPER ENDOSCOPY:  Eat a soft diet IF YOU HAVE NAUSEA, BLOATING, ABDOMINAL PAIN, OR VOMITING.    FINDING OUT THE RESULTS OF YOUR TEST Not all test results are available during your visit. DR. Darrick Penna WILL CALL YOU WITHIN 14 DAYS OF YOUR PROCEDUE WITH YOUR RESULTS. Do not assume everything is normal if you have not heard from DR. Jase Reep, CALL HER OFFICE AT [phone].  SEEK IMMEDIATE MEDICAL ATTENTION AND CALL THE OFFICE: [phone] IF:  You have more than a spotting of blood in your stool.   Your belly is swollen (abdominal distention).   You are nauseated or vomiting.   You have a temperature over 101F.   You have abdominal pain or discomfort that is severe or gets worse throughout the day.   Gastritis/DUODENITIS  Gastritis is an inflammation (the body's way of reacting to injury and/or infection) of the stomach. DUODENITIS is an inflammation (the body's way of reacting to injury and/or infection) of the FIRST PART OF THE SMALL INTESTINES. It is often  caused by bacterial (germ) infections. It can also be caused BY ASPIRIN, ALCOHOL, BC/GOODY POWDER'S, (IBUPROFEN) MOTRIN, OR ALEVE (NAPROXEN), chemicals (including alcohol), SPICY FOODS, and medications. This illness may be associated with generalized malaise (feeling tired, not well), UPPER ABDOMINAL STOMACH cramps, and fever. One common bacterial cause of gastritis is an organism known as H. Pylori. This can be treated with antibiotics.   High-Fiber Diet A high-fiber diet changes your normal diet to include more whole grains, legumes, fruits, and vegetables. Changes in the diet involve  replacing refined carbohydrates with unrefined foods. The calorie level of the diet is essentially unchanged. The Dietary Reference Intake (recommended amount) for adult males is 38 grams per day. For adult females, it is 25 grams per day. Pregnant and lactating women should consume 28 grams of fiber per day. Fiber is the intact part of a plant that is not broken down during digestion. Functional fiber is fiber that has been isolated from the plant to provide a beneficial effect in the body. PURPOSE  Increase stool bulk.   Ease and regulate bowel movements.   Lower cholesterol.  INDICATIONS THAT YOU NEED MORE FIBER  Constipation and hemorrhoids.   Uncomplicated diverticulosis (intestine condition) and irritable bowel syndrome.   Weight management.   As a protective measure against hardening of the arteries (atherosclerosis), diabetes, and cancer.   GUIDELINES FOR INCREASING FIBER IN THE DIET  Start adding fiber to the diet slowly. A gradual increase of about 5 more grams (2 slices of whole-wheat bread, 2 servings of most fruits or vegetables, or 1 bowl of high-fiber cereal) per day is best. Too rapid an increase in fiber may result in constipation, flatulence, and bloating.   Drink enough water and fluids to keep your urine clear or pale yellow. Water, juice, or caffeine-free drinks are recommended. Not drinking enough fluid may cause constipation.   Eat a variety of high-fiber foods rather than one type of fiber.   Try to increase your intake of fiber through using high-fiber foods rather than fiber pills or supplements that contain small amounts of fiber.   The goal is to change the types of food eaten. Do not supplement your present diet with high-fiber foods, but replace foods in your present diet.   INCLUDE A VARIETY OF FIBER SOURCES  Replace refined and processed grains with whole grains, canned fruits with fresh fruits, and incorporate other fiber sources. White rice, white  breads, and most bakery goods contain little or no fiber.   Brown whole-grain rice, buckwheat oats, and many fruits and vegetables are all good sources of fiber. These include: broccoli, Brussels sprouts, cabbage, cauliflower, beets, sweet potatoes, white potatoes (skin on), carrots, tomatoes, eggplant, squash, berries, fresh fruits, and dried fruits.   Cereals appear to be the richest source of fiber. Cereal fiber is found in whole grains and bran. Bran is the fiber-rich outer coat of cereal grain, which is largely removed in refining. In whole-grain cereals, the bran remains. In breakfast cereals, the largest amount of fiber is found in those with "bran" in their names. The fiber content is sometimes indicated on the label.   You may need to include additional fruits and vegetables each day.   In baking, for 1 cup white flour, you may use the following substitutions:   1 cup whole-wheat flour minus 2 tablespoons.   1/2 cup white flour plus 1/2 cup whole-wheat flour.
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
## 2015-02-16 NOTE — Op Note (Signed)
[facility] [address] [place], [address]   ENDOSCOPY PROCEDURE REPORT  PATIENT: Billy Stone, Billy Stone  MR#: [id number] BIRTHDATE: [birth year] , 64  yrs. old GENDER: male  ENDOSCOPIST: West Bali, MD REFERRED ID:[id number] Hall, M.D.  PROCEDURE DATE: March 08, 2015 PROCEDURE:   EGD w/ biopsy INDICATIONS:screening for varices. USES ASA AND IBUPROFEN. TAKES NaCl TABLETS. K 4.0 AFTER STOPPING LASIX AND PLACED ON KCl. MEDICATIONS: Monitored anesthesia care TOPICAL ANESTHETIC:   Viscous Xylocaine ASA CLASS:  DESCRIPTION OF PROCEDURE:     Physical exam was performed.  Informed consent was obtained from the patient after explaining the benefits, risks, and alternatives to the procedure.  The patient was connected to the monitor and placed in the left lateral position.  Continuous oxygen was provided by nasal cannula and IV medicine administered through an indwelling cannula.  After administration of sedation, the patients esophagus was intubated and the     endoscope was advanced under direct visualization to the second portion of the duodenum.  The scope was removed slowly by carefully examining the color, texture, anatomy, and integrity of the mucosa on the way out.  The patient was recovered in endoscopy and discharged home in satisfactory condition.  Estimated blood loss is zero unless otherwise noted in this procedure report.     ESOPHAGUS: The mucosa of the esophagus appeared normal.   STOMACH: Moderate gastropathy was found in the gastric body and gastric fundus.   Moderate erosive gastritis (inflammation) was found in the gastric antrum.  Multiple biopsies were performed using cold forceps.   DUODENUM: Moderate duodenal inflammation was found in the duodenal bulb.   The duodenal mucosa showed no abnormalities in the 2nd part of the duodenum.       COMPLICATIONS: There were no immediate complications.  ENDOSCOPIC IMPRESSION: 1.   MODERATE PORTAL GASTROPATHY 2.    MODERATE Erosive gastritis AND DUODENITIS/SMALL SUPERFICIAL ULCER IN THE DUODENAL BULB  RECOMMENDATIONS: FOLLOW A HIGH FIBER DIET. STRICTLY AVOID ASPIRIN, BC/GOODY POWDERS, IBUPROFEN/MOTRIN, OR NAPROXEN/ALEVE BECAUSE YOU HAVE A ULCERS in your small bowel. RE-START JUL 22. START OMEPRAZOLE.  TAKE 30 MINUTES PRIOR TO BREAKFAST AND SUPPER TO HELP YOUR STOMACH AND SMALL BOWEL HEAL. START ALDACTONE 50 MG WITH LASIX 20 MG DAILY TO CONTROL THE FLUID IN YOUR LEGS. MAY USE 1 OR 2 REGULAR STRENGTH TYLENOL EVERY 6 TO 8 HOURS AS NEEDED FOR PAIN. AWAIT BIOPSY RESULTS. FOLLOW UP IN SEP 2016.  REPEAT EXAM:    eSigned:  West Bali, MD March 08, 2015 1:51 PM   CPT CODES: ICD CODES:  The ICD and CPT codes recommended by this software are interpretations from the data that the clinical staff has captured with the software.  The verification of the translation of this report to the ICD and CPT codes and modifiers is the sole responsibility of the health care institution and practicing physician where this report was generated.  [employer] will not be held responsible for the validity of the ICD and CPT codes included on this report.  AMA assumes no liability for data contained or not contained herein. CPT is a Publishing rights manager of the [employer].

## 2015-02-16 NOTE — Transfer of Care (Signed)
Immediate Anesthesia Transfer of Care Note  Patient: Billy Stone  Procedure(s) Performed: Procedure(s) with comments: ESOPHAGOGASTRODUODENOSCOPY (EGD) WITH PROPOFOL (N/A) - 1030  BIOPSY (N/A) - Gastric  Patient Location: PACU  Anesthesia Type:MAC  Level of Consciousness: awake  Airway & Oxygen Therapy: Patient Spontanous Breathing and Patient connected to nasal cannula oxygen  Post-op Assessment: Report given to RN  Post vital signs: Reviewed and stable  Last Vitals:  Filed Vitals:   02/16/15 0945  BP: 128/76  Pulse:   Temp:   Resp: 20    Complications: No apparent anesthesia complications

## 2015-02-18 ENCOUNTER — Encounter (HOSPITAL_COMMUNITY): Payer: Self-pay | Admitting: Gastroenterology

## 2015-02-18 MED ORDER — STERILE WATER FOR IRRIGATION IR SOLN
Status: DC | PRN
  Administered 2015-02-16: 1000 mL

## 2015-03-01 ENCOUNTER — Telehealth: Payer: Self-pay | Admitting: Gastroenterology

## 2015-03-01 NOTE — Telephone Encounter (Signed)
Please call pt. His stomach Bx shows gastritis & DUODENITIS DUE TO YOUR USING ASPIRIN & IBUPROFEN   FOLLOW A HIGH FIBER DIET. AVOID ITEMS THAT CAUSE BLOATING & GAS.   STRICTLY AVOID ASPIRIN, BC/GOODY POWDERS, IBUPROFEN/MOTRIN, OR NAPROXEN/ALEVE BECAUSE YOU HAVE A ULCERS in your small bowel. RE-START JUL 22.  START OMEPRAZOLE.  TAKE 30 MINUTES PRIOR TO BREAKFAST AND SUPPER TO HELP YOUR STOMACH AND SMALL BOWEL HEAL.  START ALDACTONE 50 MG WITH LASIX 20 MG DAILY TO CONTROL THE FLUID IN YOUR LEGS.  TAKE 1 OR 2 REGULAR STRENGTH TYLENOL EVERY 6 TO 8 HOURS AS NEEDED FOR PAIN. DO NOT TAKE MORE THAN 8 PILLS A DAY.   FOLLOW UP IN SEP 2016 E30 CIRRHOSIS/GASTRITIS..Marland Kitchen

## 2015-03-02 NOTE — Telephone Encounter (Signed)
Pt aware of results and to start the Aldactone.

## 2015-03-02 NOTE — Telephone Encounter (Signed)
REMINDER IN EPIC °

## 2015-03-10 ENCOUNTER — Encounter (HOSPITAL_COMMUNITY): Payer: Self-pay | Admitting: Hematology & Oncology

## 2015-03-10 ENCOUNTER — Encounter (HOSPITAL_COMMUNITY): Payer: Medicare Other | Attending: Internal Medicine | Admitting: Hematology & Oncology

## 2015-03-10 VITALS — BP 127/73 | HR 78 | Temp 99.4°F | Resp 16 | Ht 65.5 in | Wt 174.0 lb

## 2015-03-10 DIAGNOSIS — D649 Anemia, unspecified: Secondary | ICD-10-CM | POA: Diagnosis not present

## 2015-03-10 DIAGNOSIS — Z808 Family history of malignant neoplasm of other organs or systems: Secondary | ICD-10-CM | POA: Diagnosis not present

## 2015-03-10 DIAGNOSIS — Z87891 Personal history of nicotine dependence: Secondary | ICD-10-CM | POA: Diagnosis not present

## 2015-03-10 DIAGNOSIS — D500 Iron deficiency anemia secondary to blood loss (chronic): Secondary | ICD-10-CM | POA: Diagnosis not present

## 2015-03-10 DIAGNOSIS — F1010 Alcohol abuse, uncomplicated: Secondary | ICD-10-CM | POA: Diagnosis not present

## 2015-03-10 DIAGNOSIS — R69 Illness, unspecified: Secondary | ICD-10-CM | POA: Diagnosis present

## 2015-03-10 DIAGNOSIS — K7030 Alcoholic cirrhosis of liver without ascites: Secondary | ICD-10-CM | POA: Diagnosis not present

## 2015-03-10 DIAGNOSIS — F1099 Alcohol use, unspecified with unspecified alcohol-induced disorder: Secondary | ICD-10-CM

## 2015-03-10 DIAGNOSIS — D72829 Elevated white blood cell count, unspecified: Secondary | ICD-10-CM

## 2015-03-10 DIAGNOSIS — D6489 Other specified anemias: Secondary | ICD-10-CM | POA: Diagnosis not present

## 2015-03-10 DIAGNOSIS — K766 Portal hypertension: Secondary | ICD-10-CM | POA: Diagnosis not present

## 2015-03-10 DIAGNOSIS — D72820 Lymphocytosis (symptomatic): Secondary | ICD-10-CM | POA: Diagnosis not present

## 2015-03-10 DIAGNOSIS — K7031 Alcoholic cirrhosis of liver with ascites: Secondary | ICD-10-CM | POA: Insufficient documentation

## 2015-03-10 LAB — CBC WITH DIFFERENTIAL/PLATELET
Basophils Absolute: 0 10*3/uL (ref 0.0–0.1)
Basophils Relative: 0 % (ref 0–1)
EOS ABS: 0.1 10*3/uL (ref 0.0–0.7)
EOS PCT: 1 % (ref 0–5)
HCT: 38 % — ABNORMAL LOW (ref 39.0–52.0)
HEMOGLOBIN: 13 g/dL (ref 13.0–17.0)
Lymphocytes Relative: 41 % (ref 12–46)
Lymphs Abs: 3.7 10*3/uL (ref 0.7–4.0)
MCH: 31.9 pg (ref 26.0–34.0)
MCHC: 34.2 g/dL (ref 30.0–36.0)
MCV: 93.1 fL (ref 78.0–100.0)
MONO ABS: 0.9 10*3/uL (ref 0.1–1.0)
Monocytes Relative: 9 % (ref 3–12)
Neutro Abs: 4.4 10*3/uL (ref 1.7–7.7)
Neutrophils Relative %: 49 % (ref 43–77)
PLATELETS: 206 10*3/uL (ref 150–400)
RBC: 4.08 MIL/uL — ABNORMAL LOW (ref 4.22–5.81)
RDW: 16.4 % — ABNORMAL HIGH (ref 11.5–15.5)
WBC: 9.1 10*3/uL (ref 4.0–10.5)

## 2015-03-10 LAB — FERRITIN: Ferritin: 25 ng/mL (ref 24–336)

## 2015-03-10 LAB — COMPREHENSIVE METABOLIC PANEL
ALBUMIN: 2.9 g/dL — AB (ref 3.5–5.0)
ALK PHOS: 158 U/L — AB (ref 38–126)
ALT: 18 U/L (ref 17–63)
ANION GAP: 12 (ref 5–15)
AST: 70 U/L — ABNORMAL HIGH (ref 15–41)
BILIRUBIN TOTAL: 1.6 mg/dL — AB (ref 0.3–1.2)
BUN: 6 mg/dL (ref 6–20)
CHLORIDE: 94 mmol/L — AB (ref 101–111)
CO2: 24 mmol/L (ref 22–32)
Calcium: 8.1 mg/dL — ABNORMAL LOW (ref 8.9–10.3)
Creatinine, Ser: 0.77 mg/dL (ref 0.61–1.24)
GFR calc non Af Amer: >60 mL/min (ref >60)
Glucose, Bld: 161 mg/dL — ABNORMAL HIGH (ref 65–99)
POTASSIUM: 3.4 mmol/L — AB (ref 3.5–5.1)
Sodium: 130 mmol/L — ABNORMAL LOW (ref 135–145)
Total Protein: 7.8 g/dL (ref 6.5–8.1)

## 2015-03-10 LAB — IRON AND TIBC
IRON: 78 ug/dL (ref 45–182)
SATURATION RATIOS: 25 % (ref 17.9–39.5)
TIBC: 312 ug/dL (ref 250–450)
UIBC: 234 ug/dL

## 2015-03-10 LAB — FOLATE: Folate: 32 ng/mL (ref >5.9)

## 2015-03-10 LAB — VITAMIN B12: VITAMIN B 12: 879 pg/mL (ref 180–914)

## 2015-03-10 LAB — RETICULOCYTES
RBC.: 4.08 MIL/uL — ABNORMAL LOW (ref 4.22–5.81)
RETIC CT PCT: 1.7 % (ref 0.4–3.1)
Retic Count, Absolute: 69.4 10*3/uL (ref 19.0–186.0)

## 2015-03-10 LAB — LACTATE DEHYDROGENASE: LDH: 133 U/L (ref 98–192)

## 2015-03-10 NOTE — Progress Notes (Signed)
Billy Stone presented for labwork.  Labs per MD order drawn via Peripheral Line 21 gauge needle inserted in right upper forearm.  Good blood return present. Procedure without incident. Needle removed intact. Patient tolerated procedure well.

## 2015-03-10 NOTE — Patient Instructions (Addendum)
[facility] at [facility] Discharge Instructions  RECOMMENDATIONS MADE BY THE CONSULTANT AND ANY TEST RESULTS WILL BE SENT TO YOUR REFERRING PHYSICIAN.  Exam and discussion today with Dr. Galen ManilaPenland. Return as scheduled for office visit in 2 weeks.    Thank you for choosing [facility] at [facility] to provide your oncology and hematology care.  To afford each patient quality time with our provider, please arrive at least 15 minutes before your scheduled appointment time.    You need to re-schedule your appointment should you arrive 10 or more minutes late.  We strive to give you quality time with our providers, and arriving late affects you and other patients whose appointments are after yours.  Also, if you no show three or more times for appointments you may be dismissed from the clinic at the providers discretion.     Again, thank you for choosing [facility].  Our hope is that these requests will decrease the amount of time that you wait before being seen by our physicians.       _____________________________________________________________  Should you have questions after your visit to [facility], please contact our office at [phone] between the hours of 8:30 a.m. and 4:30 p.m.  Voicemails left after 4:30 p.m. will not be returned until the following business day.  For prescription refill requests, have your pharmacy contact our office.
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
## 2015-03-11 LAB — PATHOLOGIST SMEAR REVIEW

## 2015-03-11 LAB — HAPTOGLOBIN: HAPTOGLOBIN: 118 mg/dL (ref 34–200)

## 2015-03-11 LAB — PROTEIN ELECTROPHORESIS, SERUM
A/G RATIO SPE: 0.6 — AB (ref 0.7–1.7)
ALPHA-1-GLOBULIN: 0.2 g/dL (ref 0.0–0.4)
ALPHA-2-GLOBULIN: 0.6 g/dL (ref 0.4–1.0)
Albumin ELP: 2.7 g/dL — ABNORMAL LOW (ref 2.9–4.4)
Beta Globulin: 1.2 g/dL (ref 0.7–1.3)
GLOBULIN, TOTAL: 4.2 g/dL — AB (ref 2.2–3.9)
Gamma Globulin: 2.3 g/dL — ABNORMAL HIGH (ref 0.4–1.8)
TOTAL PROTEIN ELP: 6.9 g/dL (ref 6.0–8.5)

## 2015-03-12 LAB — IMMUNOFIXATION ELECTROPHORESIS
IGA: 1042 mg/dL — AB (ref 61–437)
IgG (Immunoglobin G), Serum: 2494 mg/dL — ABNORMAL HIGH (ref 700–1600)
IgM, Serum: 195 mg/dL — ABNORMAL HIGH (ref 20–172)
Total Protein ELP: 7.3 g/dL (ref 6.0–8.5)

## 2015-03-26 ENCOUNTER — Encounter (HOSPITAL_BASED_OUTPATIENT_CLINIC_OR_DEPARTMENT_OTHER): Payer: Medicare Other | Admitting: Hematology & Oncology

## 2015-03-26 ENCOUNTER — Encounter (HOSPITAL_COMMUNITY): Payer: Medicare Other

## 2015-03-26 VITALS — BP 110/62 | HR 71 | Temp 99.0°F | Resp 18 | Wt 174.2 lb

## 2015-03-26 DIAGNOSIS — F1099 Alcohol use, unspecified with unspecified alcohol-induced disorder: Secondary | ICD-10-CM

## 2015-03-26 DIAGNOSIS — F1010 Alcohol abuse, uncomplicated: Secondary | ICD-10-CM

## 2015-03-26 DIAGNOSIS — E611 Iron deficiency: Secondary | ICD-10-CM

## 2015-03-26 DIAGNOSIS — K766 Portal hypertension: Secondary | ICD-10-CM

## 2015-03-26 DIAGNOSIS — D72820 Lymphocytosis (symptomatic): Secondary | ICD-10-CM

## 2015-03-26 DIAGNOSIS — D649 Anemia, unspecified: Secondary | ICD-10-CM | POA: Diagnosis not present

## 2015-03-26 DIAGNOSIS — K7030 Alcoholic cirrhosis of liver without ascites: Secondary | ICD-10-CM

## 2015-03-26 DIAGNOSIS — D500 Iron deficiency anemia secondary to blood loss (chronic): Secondary | ICD-10-CM

## 2015-03-26 DIAGNOSIS — Z9081 Acquired absence of spleen: Secondary | ICD-10-CM | POA: Diagnosis not present

## 2015-03-26 DIAGNOSIS — D72829 Elevated white blood cell count, unspecified: Secondary | ICD-10-CM | POA: Diagnosis not present

## 2015-03-26 DIAGNOSIS — K7031 Alcoholic cirrhosis of liver with ascites: Secondary | ICD-10-CM

## 2015-03-26 MED ORDER — SODIUM CHLORIDE 0.9 % IV SOLN: 125.0000 mg | Freq: Once | INTRAVENOUS | Status: DC

## 2015-03-26 MED ORDER — SODIUM CHLORIDE 0.9 % IV SOLN: INTRAVENOUS | Status: DC
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
## 2015-03-26 NOTE — Patient Instructions (Signed)
[facility] at [facility] Discharge Instructions  RECOMMENDATIONS MADE BY THE CONSULTANT AND ANY TEST RESULTS WILL BE SENT TO YOUR REFERRING PHYSICIAN.  Additional lab work ordered for today. We will get you scheduled for an iron infusion of Ferric gluconate x 2 doses. 6 weeks after 2nd iron infusion you will need lab work again. MD appointment in 3 months. Report any issues/concerns to clinic as needed prior to appointments. Return as scheduled.   Thank you for choosing  [facility] at [facility] to provide your oncology and hematology care.  To afford each patient quality time with our provider, please arrive at least 15 minutes before your scheduled appointment time.    You need to re-schedule your appointment should you arrive 10 or more minutes late.  We strive to give you quality time with our providers, and arriving late affects you and other patients whose appointments are after yours.  Also, if you no show three or more times for appointments you may be dismissed from the clinic at the providers discretion.     Again, thank you for choosing [facility].  Our hope is that these requests will decrease the amount of time that you wait before being seen by our physicians.       _____________________________________________________________  Should you have questions after your visit to [facility], please contact our office at [phone] between the hours of 8:30 a.m. and 4:30 p.m.  Voicemails left after 4:30 p.m. will not be returned until the following business day.  For prescription refill requests, have your pharmacy contact our office.

## 2015-03-29 ENCOUNTER — Encounter: Payer: Self-pay | Admitting: Gastroenterology

## 2015-03-30 ENCOUNTER — Encounter (HOSPITAL_COMMUNITY): Payer: Self-pay

## 2015-03-30 ENCOUNTER — Encounter (HOSPITAL_COMMUNITY): Payer: Medicare Other | Attending: Hematology & Oncology

## 2015-03-30 DIAGNOSIS — D649 Anemia, unspecified: Secondary | ICD-10-CM | POA: Diagnosis present

## 2015-03-30 MED ORDER — SODIUM CHLORIDE 0.9 % IV SOLN
INTRAVENOUS | Status: DC
  Administered 2015-03-30: 11:00:00 via INTRAVENOUS

## 2015-03-30 MED ORDER — SODIUM CHLORIDE 0.9 % IV SOLN
125.0000 mg | Freq: Once | INTRAVENOUS | Status: AC
  Administered 2015-03-30: 125 mg via INTRAVENOUS
  Filled 2015-03-30: qty 10

## 2015-03-30 NOTE — Patient Instructions (Signed)
[facility] at [facility] Discharge Instructions  RECOMMENDATIONS MADE BY THE CONSULTANT AND ANY TEST RESULTS WILL BE SENT TO YOUR REFERRING PHYSICIAN.  Iron infusion today Follow up as scheduled Please call the clinic if you have any questions or concerns  Thank you for choosing [facility] at [facility] to provide your oncology and hematology care.  To afford each patient quality time with our provider, please arrive at least 15 minutes before your scheduled appointment time.    You need to re-schedule your appointment should you arrive 10 or more minutes late.  We strive to give you quality time with our providers, and arriving late affects you and other patients whose appointments are after yours.  Also, if you no show three or more times for appointments you may be dismissed from the clinic at the providers discretion.     Again, thank you for choosing [facility].  Our hope is that these requests will decrease the amount of time that you wait before being seen by our physicians.       _____________________________________________________________  Should you have questions after your visit to [facility], please contact our office at [phone] between the hours of 8:30 a.m. and 4:30 p.m.  Voicemails left after 4:30 p.m. will not be returned until the following business day.  For prescription refill requests, have your pharmacy contact our office.    

## 2015-03-30 NOTE — Progress Notes (Signed)
Tolerated iron infusion well. 

## 2015-03-30 NOTE — Progress Notes (Signed)
Billy Stone Tolerated iron infusion well today Discharged ambulatory

## 2015-04-02 ENCOUNTER — Encounter (HOSPITAL_BASED_OUTPATIENT_CLINIC_OR_DEPARTMENT_OTHER): Payer: Medicare Other

## 2015-04-02 VITALS — BP 115/64 | HR 61 | Temp 98.3°F | Resp 16

## 2015-04-02 DIAGNOSIS — D649 Anemia, unspecified: Secondary | ICD-10-CM | POA: Diagnosis not present

## 2015-04-02 DIAGNOSIS — D539 Nutritional anemia, unspecified: Secondary | ICD-10-CM

## 2015-04-02 DIAGNOSIS — D509 Iron deficiency anemia, unspecified: Secondary | ICD-10-CM

## 2015-04-02 MED ORDER — SODIUM CHLORIDE 0.9 % IV SOLN
125.0000 mg | Freq: Once | INTRAVENOUS | Status: AC
  Administered 2015-04-02: 125 mg via INTRAVENOUS
  Filled 2015-04-02: qty 10

## 2015-04-02 MED ORDER — SODIUM CHLORIDE 0.9 % IV SOLN
INTRAVENOUS | Status: DC
  Administered 2015-04-02: 14:00:00 via INTRAVENOUS

## 2015-04-02 MED ORDER — SODIUM CHLORIDE 0.9 % IJ SOLN
10.0000 mL | Freq: Once | INTRAMUSCULAR | Status: AC
  Administered 2015-04-02: 10 mL via INTRAVENOUS

## 2015-04-02 MED ORDER — FERUMOXYTOL INJECTION 510 MG/17 ML
510.0000 mg | Freq: Once | INTRAVENOUS | Status: DC
  Filled 2015-04-02: qty 17

## 2015-04-02 NOTE — Patient Instructions (Signed)
[facility] at [facility] Discharge Instructions  RECOMMENDATIONS MADE BY THE CONSULTANT AND ANY TEST RESULTS WILL BE SENT TO YOUR REFERRING PHYSICIAN.  Ferric Gluconate 125 mg iron infusion (#2 of 2) given today as ordered. Return as scheduled.  Thank you for choosing [facility] at [facility] to provide your oncology and hematology care.  To afford each patient quality time with our provider, please arrive at least 15 minutes before your scheduled appointment time.    You need to re-schedule your appointment should you arrive 10 or more minutes late.  We strive to give you quality time with our providers, and arriving late affects you and other patients whose appointments are after yours.  Also, if you no show three or more times for appointments you may be dismissed from the clinic at the providers discretion.     Again, thank you for choosing [facility].  Our hope is that these requests will decrease the amount of time that you wait before being seen by our physicians.       _____________________________________________________________  Should you have questions after your visit to [facility], please contact our office at [phone] between the hours of 8:30 a.m. and 4:30 p.m.  Voicemails left after 4:30 p.m. will not be returned until the following business day.  For prescription refill requests, have your pharmacy contact our office.

## 2015-04-02 NOTE — Progress Notes (Signed)
Tolerated iron infusion well. 

## 2015-04-21 DIAGNOSIS — I2510 Atherosclerotic heart disease of native coronary artery without angina pectoris: Secondary | ICD-10-CM | POA: Diagnosis not present

## 2015-04-21 DIAGNOSIS — I10 Essential (primary) hypertension: Secondary | ICD-10-CM | POA: Diagnosis not present

## 2015-04-21 DIAGNOSIS — J449 Chronic obstructive pulmonary disease, unspecified: Secondary | ICD-10-CM | POA: Diagnosis not present

## 2015-04-21 DIAGNOSIS — K7030 Alcoholic cirrhosis of liver without ascites: Secondary | ICD-10-CM | POA: Diagnosis not present

## 2015-04-23 ENCOUNTER — Encounter (HOSPITAL_COMMUNITY): Payer: Self-pay | Admitting: Hematology & Oncology

## 2015-04-29 ENCOUNTER — Other Ambulatory Visit (HOSPITAL_COMMUNITY): Payer: Self-pay

## 2015-05-14 ENCOUNTER — Encounter (HOSPITAL_COMMUNITY): Payer: Medicare Other | Attending: Internal Medicine

## 2015-05-14 DIAGNOSIS — F1010 Alcohol abuse, uncomplicated: Secondary | ICD-10-CM

## 2015-05-14 DIAGNOSIS — K7031 Alcoholic cirrhosis of liver with ascites: Secondary | ICD-10-CM | POA: Diagnosis not present

## 2015-05-14 DIAGNOSIS — D6489 Other specified anemias: Secondary | ICD-10-CM | POA: Insufficient documentation

## 2015-05-14 DIAGNOSIS — D649 Anemia, unspecified: Secondary | ICD-10-CM | POA: Diagnosis present

## 2015-05-14 DIAGNOSIS — D500 Iron deficiency anemia secondary to blood loss (chronic): Secondary | ICD-10-CM | POA: Diagnosis not present

## 2015-05-14 DIAGNOSIS — R69 Illness, unspecified: Secondary | ICD-10-CM | POA: Diagnosis present

## 2015-05-14 LAB — COMPREHENSIVE METABOLIC PANEL
ALK PHOS: 166 U/L — AB (ref 38–126)
ALT: 14 U/L — AB (ref 17–63)
AST: 56 U/L — ABNORMAL HIGH (ref 15–41)
Albumin: 3.1 g/dL — ABNORMAL LOW (ref 3.5–5.0)
Anion gap: 9 (ref 5–15)
BUN: 5 mg/dL — ABNORMAL LOW (ref 6–20)
CALCIUM: 8.3 mg/dL — AB (ref 8.9–10.3)
CHLORIDE: 89 mmol/L — AB (ref 101–111)
CO2: 23 mmol/L (ref 22–32)
Creatinine, Ser: 0.63 mg/dL (ref 0.61–1.24)
GFR calc non Af Amer: >60 mL/min (ref >60)
GLUCOSE: 129 mg/dL — AB (ref 65–99)
Potassium: 4.6 mmol/L (ref 3.5–5.1)
Sodium: 121 mmol/L — ABNORMAL LOW (ref 135–145)
Total Bilirubin: 1.6 mg/dL — ABNORMAL HIGH (ref 0.3–1.2)
Total Protein: 8.2 g/dL — ABNORMAL HIGH (ref 6.5–8.1)

## 2015-05-14 LAB — CBC WITH DIFFERENTIAL/PLATELET
BASOS ABS: 0.1 10*3/uL (ref 0.0–0.1)
Basophils Relative: 1 %
EOS ABS: 0.2 10*3/uL (ref 0.0–0.7)
Eosinophils Relative: 2 %
HCT: 40.2 % (ref 39.0–52.0)
HEMOGLOBIN: 14.4 g/dL (ref 13.0–17.0)
LYMPHS ABS: 5.1 10*3/uL — AB (ref 0.7–4.0)
LYMPHS PCT: 44 %
MCH: 35 pg — AB (ref 26.0–34.0)
MCHC: 35.8 g/dL (ref 30.0–36.0)
MCV: 97.8 fL (ref 78.0–100.0)
Monocytes Absolute: 1.3 10*3/uL — ABNORMAL HIGH (ref 0.1–1.0)
Monocytes Relative: 12 %
NEUTROS PCT: 41 %
Neutro Abs: 4.6 10*3/uL (ref 1.7–7.7)
Platelets: 390 10*3/uL (ref 150–400)
RBC: 4.11 MIL/uL — AB (ref 4.22–5.81)
RDW: 16.8 % — ABNORMAL HIGH (ref 11.5–15.5)
WBC: 11.2 10*3/uL — AB (ref 4.0–10.5)

## 2015-05-14 LAB — FERRITIN: Ferritin: 166 ng/mL (ref 24–336)

## 2015-05-17 NOTE — Progress Notes (Signed)
LABS DRAWN

## 2015-05-19 ENCOUNTER — Other Ambulatory Visit (HOSPITAL_COMMUNITY): Payer: Self-pay

## 2015-05-19 DIAGNOSIS — D509 Iron deficiency anemia, unspecified: Secondary | ICD-10-CM

## 2015-05-25 ENCOUNTER — Encounter (HOSPITAL_BASED_OUTPATIENT_CLINIC_OR_DEPARTMENT_OTHER): Payer: Medicare Other

## 2015-05-25 DIAGNOSIS — D6489 Other specified anemias: Secondary | ICD-10-CM | POA: Diagnosis not present

## 2015-05-25 DIAGNOSIS — D509 Iron deficiency anemia, unspecified: Secondary | ICD-10-CM | POA: Diagnosis present

## 2015-05-25 LAB — BASIC METABOLIC PANEL
Anion gap: 8 (ref 5–15)
CHLORIDE: 95 mmol/L — AB (ref 101–111)
CO2: 24 mmol/L (ref 22–32)
Calcium: 8 mg/dL — ABNORMAL LOW (ref 8.9–10.3)
Creatinine, Ser: 0.55 mg/dL — ABNORMAL LOW (ref 0.61–1.24)
GFR calc Af Amer: >60 mL/min (ref >60)
GFR calc non Af Amer: >60 mL/min (ref >60)
Glucose, Bld: 125 mg/dL — ABNORMAL HIGH (ref 65–99)
Potassium: 3.3 mmol/L — ABNORMAL LOW (ref 3.5–5.1)
Sodium: 127 mmol/L — ABNORMAL LOW (ref 135–145)

## 2015-05-26 NOTE — Progress Notes (Signed)
Labs drawn

## 2015-06-03 ENCOUNTER — Other Ambulatory Visit: Payer: Self-pay

## 2015-06-03 ENCOUNTER — Ambulatory Visit (INDEPENDENT_AMBULATORY_CARE_PROVIDER_SITE_OTHER): Payer: Medicare Other | Admitting: Gastroenterology

## 2015-06-03 ENCOUNTER — Telehealth: Payer: Self-pay | Admitting: Gastroenterology

## 2015-06-03 ENCOUNTER — Encounter: Payer: Self-pay | Admitting: Gastroenterology

## 2015-06-03 VITALS — BP 129/77 | HR 62 | Temp 97.5°F | Ht 65.0 in | Wt 177.8 lb

## 2015-06-03 DIAGNOSIS — I2510 Atherosclerotic heart disease of native coronary artery without angina pectoris: Secondary | ICD-10-CM | POA: Diagnosis not present

## 2015-06-03 DIAGNOSIS — E876 Hypokalemia: Secondary | ICD-10-CM

## 2015-06-03 DIAGNOSIS — K7030 Alcoholic cirrhosis of liver without ascites: Secondary | ICD-10-CM

## 2015-06-03 DIAGNOSIS — K7460 Unspecified cirrhosis of liver: Secondary | ICD-10-CM
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
## 2015-06-03 NOTE — Telephone Encounter (Signed)
I'm sorry, but I realized after his office visit that his repeat BMP through oncology showed mild hypokalemia. As we have him on diuretics, let's recheck a BMP now and add an INR.

## 2015-06-03 NOTE — Progress Notes (Signed)
CC'ED TO PCP 

## 2015-06-03 NOTE — Telephone Encounter (Signed)
LMOM to call, he needs to do lab work right away. Lab orders faxed to [facility].

## 2015-06-03 NOTE — Assessment & Plan Note (Addendum)
ETOH cirrhosis compensated at this time. Non-compliant with ETOH cessation. Discussed at length risks of continuing this to include death. EGD up-to-date without varices. Needs Hep A/B vaccinations. CONTINUES to decline screening colonoscopy despite discussion of risks and benefits. MELD 12 in May 2016. Repeat LFTs, BMP, INR in 3 months. As of note, recent BMP with mild hypokalemia at 3.3, ordered by oncology. Chronic hyponatremia. On lasix 20 and aldactone 50 daily.   US abdomen for [facility] screening Nov 1 (as patient already will be at an appt at [facility] that day) Repeat BMP now. May need diuretic adjustment due to hypokalemia Labs in 3 months to include INR Lasix 20 mg and aldactone 50 mg Return Jan 2017 for close follow-up Needs Hep A/B vaccinations

## 2015-06-03 NOTE — Patient Instructions (Signed)
We have arranged an ultrasound for you on the same day as your Hematology appointment.  It is extremely important for your health that you stop drinking altogether. I am proud of you for cutting back already.   We will see you in Jan 2017! Make sure to get the Hep A and B vaccinations completed

## 2015-06-04 NOTE — Telephone Encounter (Signed)
Tried to call pt. Phone number temporarily out of service. Mailing the lab orders and note to pt to get done.

## 2015-06-11 ENCOUNTER — Other Ambulatory Visit: Payer: Self-pay | Admitting: Cardiology

## 2015-06-11 DIAGNOSIS — Z23 Encounter for immunization: Secondary | ICD-10-CM | POA: Diagnosis not present

## 2015-06-11 DIAGNOSIS — K7030 Alcoholic cirrhosis of liver without ascites: Secondary | ICD-10-CM | POA: Diagnosis not present

## 2015-06-16 DIAGNOSIS — Z23 Encounter for immunization: Secondary | ICD-10-CM | POA: Diagnosis not present

## 2015-06-23 DIAGNOSIS — K7460 Unspecified cirrhosis of liver: Secondary | ICD-10-CM | POA: Diagnosis not present

## 2015-06-23 DIAGNOSIS — E876 Hypokalemia: Secondary | ICD-10-CM | POA: Diagnosis not present

## 2015-06-23 LAB — PROTIME-INR
INR: 1.3 (ref <1.50)
Prothrombin Time: 16.4 seconds — ABNORMAL HIGH (ref 11.6–15.2)

## 2015-06-24 LAB — BASIC METABOLIC PANEL
BUN: 5 mg/dL — AB (ref 7–25)
CALCIUM: 8.7 mg/dL (ref 8.6–10.3)
CO2: 24 mmol/L (ref 20–31)
Chloride: 88 mmol/L — ABNORMAL LOW (ref 98–110)
Creat: 0.52 mg/dL — ABNORMAL LOW (ref 0.70–1.25)
GLUCOSE: 105 mg/dL — AB (ref 65–99)
Potassium: 4.7 mmol/L (ref 3.5–5.3)
Sodium: 122 mmol/L — ABNORMAL LOW (ref 135–146)

## 2015-06-25 ENCOUNTER — Other Ambulatory Visit (HOSPITAL_COMMUNITY): Payer: Medicare Other

## 2015-06-25 ENCOUNTER — Ambulatory Visit (HOSPITAL_COMMUNITY): Payer: Medicare Other | Admitting: Hematology & Oncology

## 2015-06-28 ENCOUNTER — Encounter (HOSPITAL_COMMUNITY): Payer: Self-pay | Admitting: Oncology

## 2015-06-28 DIAGNOSIS — D72829 Elevated white blood cell count, unspecified: Secondary | ICD-10-CM

## 2015-06-28 HISTORY — DX: Elevated white blood cell count, unspecified: D72.829

## 2015-06-28 NOTE — Assessment & Plan Note (Addendum)
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  Labs today as ordered.  Labs in 8 and 16 weeks: CBC diff, iron/TIBC, ferritin  Additional labs in 8 weeks: LDH and peripheral flow cytometry.  I have called pathology to see if one was done in the past.  We can always cancel peripheral flow if he had one.  Return in 3- 4 months for follow-up.  Addendum: I discussed the patient's case with Selena [facility]attenKim [facility].  She reports that the patient had a Flow Cytometry by [facility] in June 2016.  I have reviewed this result: A monoclonal B cell population was not detected.  An inverse T-cell  ratio of 0.5 is detected with 32% CD4-positive T-helper cells, and  63% CD8-positive T-suppressor cells. There is no loss of or aberrant  expression of the pan T cell antigens to suggest a neoplastic T cell  process. A blast phenotype was not detected.  Immunophenotyping showed approximately 34% myeloid cells, 60%  lymphocytes and 6% monocytes. Lymphocyte subsets include 1% B  cells, 3% NK cells and 95% T cells of which 32% are  CD4-positive T helper cells, and 63% are CD8-positive T suppressor  cells.    I will discuss with Dr. Galen ManilaPenland about whether there is any utility in repeating a peripheral FLOW cytometry.  I do not think there is, but I will verify.  If not, I will cancel this from his appointment list notes and we will not pursue this test.
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
## 2015-06-29 ENCOUNTER — Encounter (HOSPITAL_COMMUNITY): Payer: Medicare Other | Attending: Internal Medicine

## 2015-06-29 ENCOUNTER — Ambulatory Visit (HOSPITAL_COMMUNITY): Payer: Medicare Other | Admitting: Hematology & Oncology

## 2015-06-29 ENCOUNTER — Encounter (HOSPITAL_COMMUNITY): Payer: Self-pay | Admitting: Oncology

## 2015-06-29 ENCOUNTER — Ambulatory Visit (HOSPITAL_COMMUNITY)
Admission: RE | Admit: 2015-06-29 | Discharge: 2015-06-29 | Disposition: A | Discharge status: Home / Self Care | Payer: Medicare Other | Source: Ambulatory Visit | Attending: Gastroenterology | Admitting: Gastroenterology

## 2015-06-29 ENCOUNTER — Encounter (HOSPITAL_BASED_OUTPATIENT_CLINIC_OR_DEPARTMENT_OTHER): Payer: Medicare Other | Admitting: Oncology

## 2015-06-29 VITALS — BP 122/67 | HR 58 | Temp 98.3°F | Resp 16 | Wt 178.9 lb

## 2015-06-29 DIAGNOSIS — D6489 Other specified anemias: Secondary | ICD-10-CM | POA: Insufficient documentation

## 2015-06-29 DIAGNOSIS — D72820 Lymphocytosis (symptomatic): Secondary | ICD-10-CM | POA: Diagnosis not present

## 2015-06-29 DIAGNOSIS — D500 Iron deficiency anemia secondary to blood loss (chronic): Secondary | ICD-10-CM | POA: Diagnosis not present

## 2015-06-29 DIAGNOSIS — K7030 Alcoholic cirrhosis of liver without ascites: Secondary | ICD-10-CM | POA: Diagnosis not present

## 2015-06-29 DIAGNOSIS — D72821 Monocytosis (symptomatic): Secondary | ICD-10-CM

## 2015-06-29 DIAGNOSIS — F1010 Alcohol abuse, uncomplicated: Secondary | ICD-10-CM | POA: Insufficient documentation

## 2015-06-29 DIAGNOSIS — D539 Nutritional anemia, unspecified: Secondary | ICD-10-CM

## 2015-06-29 DIAGNOSIS — K7031 Alcoholic cirrhosis of liver with ascites: Secondary | ICD-10-CM

## 2015-06-29 DIAGNOSIS — D72829 Elevated white blood cell count, unspecified: Secondary | ICD-10-CM

## 2015-06-29 DIAGNOSIS — K7460 Unspecified cirrhosis of liver: Secondary | ICD-10-CM | POA: Diagnosis not present

## 2015-06-29 DIAGNOSIS — R69 Illness, unspecified: Secondary | ICD-10-CM | POA: Diagnosis present

## 2015-06-29 LAB — CBC WITH DIFFERENTIAL/PLATELET
BASOS ABS: 0.1 10*3/uL (ref 0.0–0.1)
BASOS PCT: 1 %
EOS ABS: 0.3 10*3/uL (ref 0.0–0.7)
Eosinophils Relative: 3 %
HCT: 40.8 % (ref 39.0–52.0)
HEMOGLOBIN: 14.7 g/dL (ref 13.0–17.0)
Lymphocytes Relative: 43 %
Lymphs Abs: 5.5 10*3/uL — ABNORMAL HIGH (ref 0.7–4.0)
MCH: 37.5 pg — ABNORMAL HIGH (ref 26.0–34.0)
MCHC: 36 g/dL (ref 30.0–36.0)
MCV: 104.1 fL — ABNORMAL HIGH (ref 78.0–100.0)
Monocytes Absolute: 1.4 10*3/uL — ABNORMAL HIGH (ref 0.1–1.0)
Monocytes Relative: 11 %
NEUTROS PCT: 42 %
Neutro Abs: 5.3 10*3/uL (ref 1.7–7.7)
Platelets: 346 10*3/uL (ref 150–400)
RBC: 3.92 MIL/uL — AB (ref 4.22–5.81)
RDW: 14 % (ref 11.5–15.5)
WBC: 12.6 10*3/uL — ABNORMAL HIGH (ref 4.0–10.5)

## 2015-06-29 LAB — COMPREHENSIVE METABOLIC PANEL
ALBUMIN: 3.3 g/dL — AB (ref 3.5–5.0)
ALK PHOS: 145 U/L — AB (ref 38–126)
ALT: 15 U/L — AB (ref 17–63)
AST: 51 U/L — AB (ref 15–41)
Anion gap: 8 (ref 5–15)
BUN: 7 mg/dL (ref 6–20)
CALCIUM: 8.5 mg/dL — AB (ref 8.9–10.3)
CO2: 24 mmol/L (ref 22–32)
Chloride: 90 mmol/L — ABNORMAL LOW (ref 101–111)
Creatinine, Ser: 0.7 mg/dL (ref 0.61–1.24)
GFR calc Af Amer: >60 mL/min (ref >60)
GFR calc non Af Amer: >60 mL/min (ref >60)
GLUCOSE: 119 mg/dL — AB (ref 65–99)
Potassium: 4.4 mmol/L (ref 3.5–5.1)
SODIUM: 122 mmol/L — AB (ref 135–145)
Total Bilirubin: 1.1 mg/dL (ref 0.3–1.2)
Total Protein: 8.5 g/dL — ABNORMAL HIGH (ref 6.5–8.1)

## 2015-06-29 LAB — FERRITIN: Ferritin: 175 ng/mL (ref 24–336)

## 2015-06-29 NOTE — Patient Instructions (Signed)
[facility] at [facility] Discharge Instructions  RECOMMENDATIONS MADE BY THE CONSULTANT AND ANY TEST RESULTS WILL BE SENT TO YOUR REFERRING PHYSICIAN.  Exam and discussion by Dellis Aneshomas Kefalas, PA-C Will let you know if any concerns with your lab work Call with any concerns or issues Follow-up: Labs in 6 - 8 weeks and in 12 - 16 weeks Office visit in 12 - 16 weeks.  Thank you for choosing [facility] at [facility] to provide your oncology and hematology care.  To afford each patient quality time with our provider, please arrive at least 15 minutes before your scheduled appointment time.    You need to re-schedule your appointment should you arrive 10 or more minutes late.  We strive to give you quality time with our providers, and arriving late affects you and other patients whose appointments are after yours.  Also, if you no show three or more times for appointments you may be dismissed from the clinic at the providers discretion.     Again, thank you for choosing [facility].  Our hope is that these requests will decrease the amount of time that you wait before being seen by our physicians.       _____________________________________________________________  Should you have questions after your visit to [facility], please contact our office at [phone] between the hours of 8:30 a.m. and 4:30 p.m.  Voicemails left after 4:30 p.m. will not be returned until the following business day.  For prescription refill requests, have your pharmacy contact our office.

## 2015-06-30 NOTE — Progress Notes (Signed)
LABS DRAWN

## 2015-07-11 NOTE — Progress Notes (Signed)
Quick Note:  Cirrhosis. No HCC> Repeat in 6 months. ______

## 2015-07-11 NOTE — Progress Notes (Signed)
Quick Note:  Chronic hyponatremia. INR stable. Continues to drink ETOH. Follow-up in Jan 2017. ______

## 2015-07-12 NOTE — Progress Notes (Signed)
Quick Note:  To [name]. ______

## 2015-07-12 NOTE — Progress Notes (Signed)
Quick Note:  [place] US in 6 months. ______

## 2015-07-12 NOTE — Progress Notes (Signed)
Quick Note:  Pt is aware of results. ______ 

## 2015-07-12 NOTE — Progress Notes (Signed)
ON RECALL  °

## 2015-07-28 DIAGNOSIS — Z23 Encounter for immunization: Secondary | ICD-10-CM | POA: Diagnosis not present

## 2015-08-04 ENCOUNTER — Ambulatory Visit (INDEPENDENT_AMBULATORY_CARE_PROVIDER_SITE_OTHER): Payer: Medicare Other | Admitting: Cardiology

## 2015-08-04 ENCOUNTER — Encounter: Payer: Self-pay | Admitting: Cardiology

## 2015-08-04 VITALS — BP 118/68 | HR 60 | Ht 65.0 in | Wt 181.0 lb

## 2015-08-04 DIAGNOSIS — I10 Essential (primary) hypertension: Secondary | ICD-10-CM

## 2015-08-04 DIAGNOSIS — E785 Hyperlipidemia, unspecified: Secondary | ICD-10-CM

## 2015-08-04 DIAGNOSIS — I479 Paroxysmal tachycardia, unspecified: Secondary | ICD-10-CM | POA: Diagnosis not present

## 2015-08-04 DIAGNOSIS — I2510 Atherosclerotic heart disease of native coronary artery without angina pectoris: Secondary | ICD-10-CM
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
## 2015-08-04 NOTE — Patient Instructions (Signed)
Medication Instructions:  Your physician recommends that you continue on your current medications as directed. Please refer to the Current Medication list given to you today.   Labwork: Your physician recommends that you return for lab work in: Fasting lipids  ( when doing the other blood work for oncology)   Testing/Procedures: none  Follow-Up: Your physician wants you to follow-up in: 6 months with Dr. Diona BrownerMcDowell.  You will receive a reminder letter in the mail two months in advance. If you don't receive a letter, please call our office to schedule the follow-up appointment.   Any Other Special Instructions Will Be Listed Below (If Applicable).       If you need a refill on your cardiac medications before your next appointment, please call your pharmacy.  Thanks for choosing [facility]!!!

## 2015-08-24 ENCOUNTER — Other Ambulatory Visit (HOSPITAL_COMMUNITY): Payer: Medicare Other

## 2015-09-01 ENCOUNTER — Encounter (HOSPITAL_COMMUNITY): Payer: Medicare Other | Attending: Internal Medicine

## 2015-09-01 DIAGNOSIS — D72829 Elevated white blood cell count, unspecified: Secondary | ICD-10-CM | POA: Diagnosis not present

## 2015-09-01 DIAGNOSIS — K7031 Alcoholic cirrhosis of liver with ascites: Secondary | ICD-10-CM | POA: Insufficient documentation

## 2015-09-01 DIAGNOSIS — D6489 Other specified anemias: Secondary | ICD-10-CM | POA: Insufficient documentation

## 2015-09-01 DIAGNOSIS — F1010 Alcohol abuse, uncomplicated: Secondary | ICD-10-CM | POA: Diagnosis not present

## 2015-09-01 DIAGNOSIS — Z23 Encounter for immunization: Secondary | ICD-10-CM | POA: Diagnosis not present

## 2015-09-01 DIAGNOSIS — D500 Iron deficiency anemia secondary to blood loss (chronic): Secondary | ICD-10-CM | POA: Insufficient documentation

## 2015-09-01 DIAGNOSIS — D7289 Other specified disorders of white blood cells: Secondary | ICD-10-CM | POA: Diagnosis not present

## 2015-09-01 DIAGNOSIS — D539 Nutritional anemia, unspecified: Secondary | ICD-10-CM | POA: Diagnosis not present

## 2015-09-01 DIAGNOSIS — R69 Illness, unspecified: Secondary | ICD-10-CM | POA: Diagnosis present

## 2015-09-01 LAB — CBC WITH DIFFERENTIAL/PLATELET
BASOS PCT: 1 %
Basophils Absolute: 0.1 10*3/uL (ref 0.0–0.1)
Eosinophils Absolute: 0.3 10*3/uL (ref 0.0–0.7)
Eosinophils Relative: 3 %
HEMATOCRIT: 39.2 % (ref 39.0–52.0)
Hemoglobin: 14.1 g/dL (ref 13.0–17.0)
Lymphocytes Relative: 39 %
Lymphs Abs: 4.7 10*3/uL — ABNORMAL HIGH (ref 0.7–4.0)
MCH: 37.6 pg — ABNORMAL HIGH (ref 26.0–34.0)
MCHC: 36 g/dL (ref 30.0–36.0)
MCV: 104.5 fL — AB (ref 78.0–100.0)
MONO ABS: 1 10*3/uL (ref 0.1–1.0)
MONOS PCT: 8 %
NEUTROS ABS: 5.9 10*3/uL (ref 1.7–7.7)
Neutrophils Relative %: 49 %
Platelets: 353 10*3/uL (ref 150–400)
RBC: 3.75 MIL/uL — ABNORMAL LOW (ref 4.22–5.81)
RDW: 13.7 % (ref 11.5–15.5)
WBC: 11.9 10*3/uL — ABNORMAL HIGH (ref 4.0–10.5)

## 2015-09-01 LAB — FERRITIN: Ferritin: 302 ng/mL (ref 24–336)

## 2015-09-01 LAB — IRON AND TIBC
Iron: 83 ug/dL (ref 45–182)
Saturation Ratios: 29 % (ref 17.9–39.5)
TIBC: 287 ug/dL (ref 250–450)
UIBC: 204 ug/dL

## 2015-09-01 LAB — LACTATE DEHYDROGENASE: LDH: 106 U/L (ref 98–192)

## 2015-09-02 NOTE — Progress Notes (Signed)
LABS DRAWN

## 2015-09-07 ENCOUNTER — Ambulatory Visit (INDEPENDENT_AMBULATORY_CARE_PROVIDER_SITE_OTHER): Payer: Medicare Other | Admitting: Gastroenterology

## 2015-09-07 ENCOUNTER — Other Ambulatory Visit (HOSPITAL_COMMUNITY): Payer: Self-pay | Admitting: Emergency Medicine

## 2015-09-07 ENCOUNTER — Encounter: Payer: Self-pay | Admitting: Gastroenterology

## 2015-09-07 VITALS — BP 110/67 | HR 64 | Temp 97.3°F | Ht 65.0 in | Wt 178.2 lb

## 2015-09-07 DIAGNOSIS — K7030 Alcoholic cirrhosis of liver without ascites: Secondary | ICD-10-CM | POA: Diagnosis not present
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
## 2015-09-07 NOTE — Patient Instructions (Signed)
We are giving you a copy of the labs for the hematology appointment. I just want to make sure this doesn't get missed.  I will see you in June 2017!  You will need an upper endoscopy again at that time.

## 2015-09-11 NOTE — Assessment & Plan Note (Signed)
65 year old with ETOH cirrhosis, compensated at this time. Wrapping up hepatitis vaccinations with PCP. Non-compliant with ETOH cessation, and patient is well aware of risks including health decline and death. Next EGD in June 2017. Continues to decline colonoscopy screening. Needs repeat labs to calculate MELD. Next US in June 2017. Return in June 2017.

## 2015-09-14 NOTE — Progress Notes (Signed)
CC'ED TO PCP 

## 2015-09-23 DIAGNOSIS — H2513 Age-related nuclear cataract, bilateral: Secondary | ICD-10-CM | POA: Diagnosis not present

## 2015-09-23 DIAGNOSIS — H35033 Hypertensive retinopathy, bilateral: Secondary | ICD-10-CM | POA: Diagnosis not present

## 2015-09-23 DIAGNOSIS — H5213 Myopia, bilateral: Secondary | ICD-10-CM | POA: Diagnosis not present

## 2015-09-23 DIAGNOSIS — I10 Essential (primary) hypertension: Secondary | ICD-10-CM | POA: Diagnosis not present

## 2015-10-06 ENCOUNTER — Encounter (HOSPITAL_COMMUNITY): Payer: Self-pay | Admitting: Hematology & Oncology

## 2015-10-06 ENCOUNTER — Encounter (HOSPITAL_COMMUNITY): Payer: Medicare Other | Attending: Internal Medicine | Admitting: Hematology & Oncology

## 2015-10-06 ENCOUNTER — Encounter (HOSPITAL_COMMUNITY): Payer: Medicare Other

## 2015-10-06 VITALS — BP 124/64 | HR 78 | Temp 99.0°F | Resp 18 | Wt 176.8 lb

## 2015-10-06 DIAGNOSIS — Z7289 Other problems related to lifestyle: Secondary | ICD-10-CM

## 2015-10-06 DIAGNOSIS — D72829 Elevated white blood cell count, unspecified: Secondary | ICD-10-CM | POA: Diagnosis not present

## 2015-10-06 DIAGNOSIS — K766 Portal hypertension: Secondary | ICD-10-CM

## 2015-10-06 DIAGNOSIS — K7030 Alcoholic cirrhosis of liver without ascites: Secondary | ICD-10-CM

## 2015-10-06 DIAGNOSIS — F1010 Alcohol abuse, uncomplicated: Secondary | ICD-10-CM | POA: Insufficient documentation

## 2015-10-06 DIAGNOSIS — D539 Nutritional anemia, unspecified: Secondary | ICD-10-CM

## 2015-10-06 DIAGNOSIS — D7589 Other specified diseases of blood and blood-forming organs: Secondary | ICD-10-CM

## 2015-10-06 DIAGNOSIS — D500 Iron deficiency anemia secondary to blood loss (chronic): Secondary | ICD-10-CM | POA: Insufficient documentation

## 2015-10-06 DIAGNOSIS — K3189 Other diseases of stomach and duodenum: Secondary | ICD-10-CM | POA: Diagnosis not present

## 2015-10-06 DIAGNOSIS — D649 Anemia, unspecified: Secondary | ICD-10-CM

## 2015-10-06 DIAGNOSIS — R69 Illness, unspecified: Secondary | ICD-10-CM | POA: Diagnosis present

## 2015-10-06 DIAGNOSIS — K7031 Alcoholic cirrhosis of liver with ascites: Secondary | ICD-10-CM | POA: Insufficient documentation

## 2015-10-06 DIAGNOSIS — D6489 Other specified anemias: Secondary | ICD-10-CM | POA: Diagnosis not present

## 2015-10-06 DIAGNOSIS — Z9081 Acquired absence of spleen: Secondary | ICD-10-CM

## 2015-10-06 DIAGNOSIS — D509 Iron deficiency anemia, unspecified: Secondary | ICD-10-CM

## 2015-10-06 LAB — COMPREHENSIVE METABOLIC PANEL
ALBUMIN: 2.7 g/dL — AB (ref 3.5–5.0)
ALT: 23 U/L (ref 17–63)
ANION GAP: 10 (ref 5–15)
AST: 73 U/L — AB (ref 15–41)
Alkaline Phosphatase: 147 U/L — ABNORMAL HIGH (ref 38–126)
BUN: 5 mg/dL — AB (ref 6–20)
CHLORIDE: 90 mmol/L — AB (ref 101–111)
CO2: 25 mmol/L (ref 22–32)
Calcium: 8.1 mg/dL — ABNORMAL LOW (ref 8.9–10.3)
Creatinine, Ser: 0.62 mg/dL (ref 0.61–1.24)
GFR calc Af Amer: >60 mL/min (ref >60)
Glucose, Bld: 164 mg/dL — ABNORMAL HIGH (ref 65–99)
POTASSIUM: 3.5 mmol/L (ref 3.5–5.1)
Sodium: 125 mmol/L — ABNORMAL LOW (ref 135–145)
TOTAL PROTEIN: 7.8 g/dL (ref 6.5–8.1)
Total Bilirubin: 1.8 mg/dL — ABNORMAL HIGH (ref 0.3–1.2)

## 2015-10-06 LAB — IRON AND TIBC
Iron: 152 ug/dL (ref 45–182)
Saturation Ratios: 58 % — ABNORMAL HIGH (ref 17.9–39.5)
TIBC: 260 ug/dL (ref 250–450)
UIBC: 108 ug/dL

## 2015-10-06 LAB — CBC WITH DIFFERENTIAL/PLATELET
BASOS ABS: 0.1 10*3/uL (ref 0.0–0.1)
Basophils Relative: 1 %
EOS ABS: 0.2 10*3/uL (ref 0.0–0.7)
Eosinophils Relative: 2 %
HCT: 39.1 % (ref 39.0–52.0)
HEMOGLOBIN: 14 g/dL (ref 13.0–17.0)
LYMPHS ABS: 3.3 10*3/uL (ref 0.7–4.0)
Lymphocytes Relative: 35 %
MCH: 37.9 pg — AB (ref 26.0–34.0)
MCHC: 35.8 g/dL (ref 30.0–36.0)
MCV: 106 fL — ABNORMAL HIGH (ref 78.0–100.0)
Monocytes Absolute: 0.8 10*3/uL (ref 0.1–1.0)
Monocytes Relative: 9 %
NEUTROS PCT: 53 %
Neutro Abs: 4.8 10*3/uL (ref 1.7–7.7)
Platelets: 232 10*3/uL (ref 150–400)
RBC: 3.69 MIL/uL — AB (ref 4.22–5.81)
RDW: 14.6 % (ref 11.5–15.5)
WBC: 9.2 10*3/uL (ref 4.0–10.5)

## 2015-10-06 LAB — FERRITIN: FERRITIN: 323 ng/mL (ref 24–336)

## 2015-10-06 LAB — PROTIME-INR
INR: 1.46 (ref 0.00–1.49)
PROTHROMBIN TIME: 17.8 s — AB (ref 11.6–15.2)
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
## 2015-10-06 NOTE — Patient Instructions (Addendum)
[facility] at [facility] Discharge Instructions  RECOMMENDATIONS MADE BY THE CONSULTANT AND ANY TEST RESULTS WILL BE SENT TO YOUR REFERRING PHYSICIAN.   Exam and discussion by Dr Galen Manila today Ferritin is still pending, we will call you with those results Return to see the doctor in 4 months Labs every 6 weeks Please call the clinic if you have any questions or concerns    Thank you for choosing [facility] at [facility] to provide your oncology and hematology care.  To afford each patient quality time with our provider, please arrive at least 15 minutes before your scheduled appointment time.   Beginning January 23rd 2017 lab work for the [facility] will be done in the  Main lab at [facility] on 1st floor. If you have a lab appointment with the Cancer Center please come in thru the  Main Entrance and check in at the main information desk  You need to re-schedule your appointment should you arrive 10 or more minutes late.  We strive to give you quality time with our providers, and arriving late affects you and other patients whose appointments are after yours.  Also, if you no show three or more times for appointments you may be dismissed from the clinic at the providers discretion.     Again, thank you for choosing [facility].  Our hope is that these requests will decrease the amount of time that you wait before being seen by our physicians.       _____________________________________________________________  Should you have questions after your visit to [facility], please contact our office at [phone] between the hours of 8:30 a.m. and 4:30 p.m.  Voicemails left after 4:30 p.m. will not be returned until the following business day.  For prescription refill requests, have your pharmacy contact our office.

## 2015-10-13 DIAGNOSIS — Z23 Encounter for immunization: Secondary | ICD-10-CM | POA: Diagnosis not present

## 2015-10-17 DIAGNOSIS — D7589 Other specified diseases of blood and blood-forming organs: Secondary | ICD-10-CM | POA: Insufficient documentation

## 2015-10-22 DIAGNOSIS — I10 Essential (primary) hypertension: Secondary | ICD-10-CM | POA: Diagnosis not present

## 2015-10-22 DIAGNOSIS — I2510 Atherosclerotic heart disease of native coronary artery without angina pectoris: Secondary | ICD-10-CM | POA: Diagnosis not present

## 2015-10-22 DIAGNOSIS — J449 Chronic obstructive pulmonary disease, unspecified: Secondary | ICD-10-CM | POA: Diagnosis not present

## 2015-10-22 DIAGNOSIS — J309 Allergic rhinitis, unspecified: Secondary | ICD-10-CM | POA: Diagnosis not present

## 2015-10-26 NOTE — Progress Notes (Signed)
Quick Note:  PT is aware of results. ______ 

## 2015-10-26 NOTE — Progress Notes (Signed)
Quick Note:  Chronic hyponatremia in the setting of known cirrhosis. MELD 13, with MELD Na (using sodium) is 23, obviously elevated in setting of hyponatremia. His overall prognosis is not good, especially as he continues to drink. Limited options for patient in setting of ongoing ETOH. Keep upcoming follow-ups. ______

## 2015-11-17 ENCOUNTER — Other Ambulatory Visit (HOSPITAL_COMMUNITY): Payer: Medicare Other

## 2015-11-19 ENCOUNTER — Other Ambulatory Visit (HOSPITAL_COMMUNITY): Payer: Medicare Other

## 2015-11-23 ENCOUNTER — Encounter (HOSPITAL_COMMUNITY): Payer: Medicare Other | Attending: Internal Medicine

## 2015-11-23 DIAGNOSIS — D500 Iron deficiency anemia secondary to blood loss (chronic): Secondary | ICD-10-CM | POA: Diagnosis not present

## 2015-11-23 DIAGNOSIS — D6489 Other specified anemias: Secondary | ICD-10-CM | POA: Diagnosis not present

## 2015-11-23 DIAGNOSIS — R69 Illness, unspecified: Secondary | ICD-10-CM | POA: Diagnosis present

## 2015-11-23 DIAGNOSIS — F1010 Alcohol abuse, uncomplicated: Secondary | ICD-10-CM | POA: Diagnosis not present

## 2015-11-23 DIAGNOSIS — D539 Nutritional anemia, unspecified: Secondary | ICD-10-CM

## 2015-11-23 DIAGNOSIS — K7031 Alcoholic cirrhosis of liver with ascites: Secondary | ICD-10-CM | POA: Diagnosis not present

## 2015-11-23 LAB — CBC WITH DIFFERENTIAL/PLATELET
BASOS PCT: 0 %
Basophils Absolute: 0 10*3/uL (ref 0.0–0.1)
Eosinophils Absolute: 0.5 10*3/uL (ref 0.0–0.7)
Eosinophils Relative: 5 %
HEMATOCRIT: 37.7 % — AB (ref 39.0–52.0)
Hemoglobin: 13.7 g/dL (ref 13.0–17.0)
Lymphocytes Relative: 47 %
Lymphs Abs: 4.9 10*3/uL — ABNORMAL HIGH (ref 0.7–4.0)
MCH: 37 pg — ABNORMAL HIGH (ref 26.0–34.0)
MCHC: 36.3 g/dL — AB (ref 30.0–36.0)
MCV: 101.9 fL — AB (ref 78.0–100.0)
MONO ABS: 0.5 10*3/uL (ref 0.1–1.0)
MONOS PCT: 5 %
NEUTROS ABS: 4.5 10*3/uL (ref 1.7–7.7)
Neutrophils Relative %: 43 %
Platelets: 307 10*3/uL (ref 150–400)
RBC: 3.7 MIL/uL — ABNORMAL LOW (ref 4.22–5.81)
RDW: 13.7 % (ref 11.5–15.5)
WBC: 10.5 10*3/uL (ref 4.0–10.5)

## 2015-11-23 LAB — IRON AND TIBC
Iron: 108 ug/dL (ref 45–182)
Saturation Ratios: 44 % — ABNORMAL HIGH (ref 17.9–39.5)
TIBC: 245 ug/dL — AB (ref 250–450)
UIBC: 137 ug/dL

## 2015-11-23 LAB — FERRITIN: Ferritin: 350 ng/mL — ABNORMAL HIGH (ref 24–336)

## 2015-12-07 ENCOUNTER — Telehealth: Payer: Self-pay | Admitting: Gastroenterology

## 2015-12-07 NOTE — Telephone Encounter (Signed)
RECALL FOR ULTRASOUND 

## 2015-12-07 NOTE — Telephone Encounter (Signed)
Letter mailed

## 2015-12-22 ENCOUNTER — Telehealth: Payer: Self-pay

## 2015-12-22 ENCOUNTER — Other Ambulatory Visit: Payer: Self-pay

## 2015-12-22 DIAGNOSIS — K7460 Unspecified cirrhosis of liver: Secondary | ICD-10-CM

## 2015-12-22 NOTE — Telephone Encounter (Signed)
1545- Pt called and requested to be set up for his US.  1600- Called pt and he is set for US on 05/25 @0815 

## 2015-12-29 ENCOUNTER — Encounter (HOSPITAL_COMMUNITY): Payer: Medicare Other | Attending: Internal Medicine

## 2015-12-29 DIAGNOSIS — D539 Nutritional anemia, unspecified: Secondary | ICD-10-CM

## 2015-12-29 DIAGNOSIS — R69 Illness, unspecified: Secondary | ICD-10-CM | POA: Diagnosis present

## 2015-12-29 DIAGNOSIS — D500 Iron deficiency anemia secondary to blood loss (chronic): Secondary | ICD-10-CM | POA: Diagnosis not present

## 2015-12-29 DIAGNOSIS — K7031 Alcoholic cirrhosis of liver with ascites: Secondary | ICD-10-CM | POA: Diagnosis not present

## 2015-12-29 DIAGNOSIS — F1010 Alcohol abuse, uncomplicated: Secondary | ICD-10-CM | POA: Insufficient documentation

## 2015-12-29 DIAGNOSIS — D6489 Other specified anemias: Secondary | ICD-10-CM | POA: Insufficient documentation

## 2015-12-29 LAB — CBC WITH DIFFERENTIAL/PLATELET
Basophils Absolute: 0.1 10*3/uL (ref 0.0–0.1)
Basophils Relative: 1 %
EOS ABS: 0.4 10*3/uL (ref 0.0–0.7)
Eosinophils Relative: 4 %
HCT: 38.3 % — ABNORMAL LOW (ref 39.0–52.0)
Hemoglobin: 13.7 g/dL (ref 13.0–17.0)
LYMPHS ABS: 4.1 10*3/uL — AB (ref 0.7–4.0)
LYMPHS PCT: 46 %
MCH: 37 pg — ABNORMAL HIGH (ref 26.0–34.0)
MCHC: 35.8 g/dL (ref 30.0–36.0)
MCV: 103.5 fL — ABNORMAL HIGH (ref 78.0–100.0)
Monocytes Absolute: 0.8 10*3/uL (ref 0.1–1.0)
Monocytes Relative: 9 %
NEUTROS PCT: 40 %
Neutro Abs: 3.6 10*3/uL (ref 1.7–7.7)
PLATELETS: 224 10*3/uL (ref 150–400)
RBC: 3.7 MIL/uL — ABNORMAL LOW (ref 4.22–5.81)
RDW: 15.2 % (ref 11.5–15.5)
WBC: 9 10*3/uL (ref 4.0–10.5)

## 2015-12-29 LAB — FERRITIN: FERRITIN: 161 ng/mL (ref 24–336)

## 2015-12-29 LAB — IRON AND TIBC
Iron: 139 ug/dL (ref 45–182)
SATURATION RATIOS: 49 % — AB (ref 17.9–39.5)
TIBC: 284 ug/dL (ref 250–450)
UIBC: 145 ug/dL

## 2016-01-12 DIAGNOSIS — H2512 Age-related nuclear cataract, left eye: Secondary | ICD-10-CM | POA: Diagnosis not present

## 2016-01-12 DIAGNOSIS — H2513 Age-related nuclear cataract, bilateral: Secondary | ICD-10-CM | POA: Diagnosis not present

## 2016-01-20 ENCOUNTER — Ambulatory Visit (HOSPITAL_COMMUNITY)
Admission: RE | Admit: 2016-01-20 | Discharge: 2016-01-20 | Disposition: A | Discharge status: Home / Self Care | Payer: Medicare Other | Source: Ambulatory Visit | Attending: Gastroenterology | Admitting: Gastroenterology

## 2016-01-20 DIAGNOSIS — K828 Other specified diseases of gallbladder: Secondary | ICD-10-CM | POA: Insufficient documentation

## 2016-01-20 DIAGNOSIS — K7460 Unspecified cirrhosis of liver: Secondary | ICD-10-CM | POA: Diagnosis not present

## 2016-01-26 NOTE — Progress Notes (Signed)
Quick Note:  PT is aware of results. ______ 

## 2016-01-26 NOTE — Progress Notes (Signed)
Quick Note:  Reviewed US. No HCC. Will repeat in 6 months. Patient will be seeing me mid June. ______

## 2016-02-02 ENCOUNTER — Encounter (HOSPITAL_COMMUNITY): Payer: Medicare Other | Attending: Internal Medicine

## 2016-02-02 DIAGNOSIS — D500 Iron deficiency anemia secondary to blood loss (chronic): Secondary | ICD-10-CM | POA: Diagnosis not present

## 2016-02-02 DIAGNOSIS — R69 Illness, unspecified: Secondary | ICD-10-CM | POA: Diagnosis present

## 2016-02-02 DIAGNOSIS — D6489 Other specified anemias: Secondary | ICD-10-CM | POA: Diagnosis not present

## 2016-02-02 DIAGNOSIS — K7031 Alcoholic cirrhosis of liver with ascites: Secondary | ICD-10-CM | POA: Insufficient documentation

## 2016-02-02 DIAGNOSIS — F1010 Alcohol abuse, uncomplicated: Secondary | ICD-10-CM | POA: Diagnosis not present

## 2016-02-02 DIAGNOSIS — D539 Nutritional anemia, unspecified: Secondary | ICD-10-CM

## 2016-02-02 LAB — IRON AND TIBC
Iron: 169 ug/dL (ref 45–182)
SATURATION RATIOS: 71 % — AB (ref 17.9–39.5)
TIBC: 238 ug/dL — AB (ref 250–450)
UIBC: 69 ug/dL

## 2016-02-02 LAB — CBC WITH DIFFERENTIAL/PLATELET
BASOS ABS: 0.1 10*3/uL (ref 0.0–0.1)
Basophils Relative: 2 %
EOS ABS: 0.3 10*3/uL (ref 0.0–0.7)
EOS PCT: 3 %
HCT: 40.4 % (ref 39.0–52.0)
Hemoglobin: 14.6 g/dL (ref 13.0–17.0)
LYMPHS ABS: 5.8 10*3/uL — AB (ref 0.7–4.0)
Lymphocytes Relative: 65 %
MCH: 36.5 pg — ABNORMAL HIGH (ref 26.0–34.0)
MCHC: 36.1 g/dL — ABNORMAL HIGH (ref 30.0–36.0)
MCV: 101 fL — ABNORMAL HIGH (ref 78.0–100.0)
MONO ABS: 0.6 10*3/uL (ref 0.1–1.0)
Monocytes Relative: 7 %
Neutro Abs: 2.1 10*3/uL (ref 1.7–7.7)
Neutrophils Relative %: 23 %
PLATELETS: 185 10*3/uL (ref 150–400)
RBC: 4 MIL/uL — AB (ref 4.22–5.81)
RDW: 14.9 % (ref 11.5–15.5)
WBC: 9 10*3/uL (ref 4.0–10.5)

## 2016-02-02 LAB — FERRITIN: Ferritin: 253 ng/mL (ref 24–336)

## 2016-02-03 ENCOUNTER — Encounter (HOSPITAL_COMMUNITY): Payer: Self-pay | Admitting: Oncology

## 2016-02-03 ENCOUNTER — Ambulatory Visit (HOSPITAL_COMMUNITY): Payer: Medicare Other | Admitting: Oncology

## 2016-02-03 ENCOUNTER — Encounter (HOSPITAL_BASED_OUTPATIENT_CLINIC_OR_DEPARTMENT_OTHER): Payer: Medicare Other | Admitting: Oncology

## 2016-02-03 VITALS — BP 109/76 | HR 63 | Temp 98.1°F | Resp 18 | Wt 161.5 lb

## 2016-02-03 DIAGNOSIS — D7589 Other specified diseases of blood and blood-forming organs: Secondary | ICD-10-CM | POA: Diagnosis not present

## 2016-02-03 DIAGNOSIS — D72829 Elevated white blood cell count, unspecified: Secondary | ICD-10-CM | POA: Diagnosis not present

## 2016-02-03 DIAGNOSIS — F1010 Alcohol abuse, uncomplicated: Secondary | ICD-10-CM | POA: Diagnosis not present

## 2016-02-03 DIAGNOSIS — D72820 Lymphocytosis (symptomatic): Secondary | ICD-10-CM

## 2016-02-03 DIAGNOSIS — D539 Nutritional anemia, unspecified: Secondary | ICD-10-CM
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
## 2016-02-03 NOTE — Assessment & Plan Note (Addendum)
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  Labs today as ordered.  Labs in 6 months: CBC diff, CMET, anemia panel.  He has a history of iron deficiency.  He also has a macrocytosis without anemia.  He continues to consume 1-3 beers per day on average.  He notes that his taste for EtOH has declined significantly.  EtOH abstinence is encouraged.  He reports difficulty sleeping.  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  I have recommended the following sleeping aids: Benadryl, Tylenol PM, Melatonin, Valerian Root, and ZZZquil.  He is to follow the directions provided on the medication he chooses.  If ineffective, he is to follow-up with his primary care provider.  Return in 6 months for follow-up.  I think if his labs are stable, further decrease in frequency of lab appointments can be considered.

## 2016-02-03 NOTE — Patient Instructions (Signed)
[facility] at [facility] Discharge Instructions  RECOMMENDATIONS MADE BY THE CONSULTANT AND ANY TEST RESULTS WILL BE SENT TO YOUR REFERRING PHYSICIAN.  Labs today are very stable. We will continue to monitor you labs every 2 months. You will return in 6 months for follow-up appointment. Some options you have regarding difficulty sleeping include:  Benadryl  Tyelenol PM  Melatonin  Valerian Root  ZZZquil. Follow the instructions on the above mentioned medications regarding dosing and frequency. If this is ineffective for your sleep, please contact your primary care provider.  Thank you for choosing [facility] at [facility] to provide your oncology and hematology care.  To afford each patient quality time with our provider, please arrive at least 15 minutes before your scheduled appointment time.   Beginning January 23rd 2017 lab work for the [facility] will be done in the  Main lab at [facility] on 1st floor. If you have a lab appointment with the Cancer Center please come in thru the  Main Entrance and check in at the main information desk  You need to re-schedule your appointment should you arrive 10 or more minutes late.  We strive to give you quality time with our providers, and arriving late affects you and other patients whose appointments are after yours.  Also, if you no show three or more times for appointments you may be dismissed from the clinic at the providers discretion.     Again, thank you for choosing [facility].  Our hope is that these requests will decrease the amount of time that you wait before being seen by our physicians.       _____________________________________________________________  Should you have questions after your visit to [facility], please contact our office at [phone] between the hours of 8:30 a.m. and 4:30 p.m.  Voicemails left after 4:30 p.m. will not be returned  until the following business day.  For prescription refill requests, have your pharmacy contact our office.         Resources For Cancer Patients and their Caregivers ? American Cancer Society: Can assist with transportation, wigs, general needs, runs Look Good Feel Better.        ([phone] ? Cancer Care: Provides financial assistance, online support groups, medication/co-pay assistance.  1-800-813-HOPE 613-502-6501(4673) ? [facility] Assists [place] cancer patients and their families through emotional , educational and financial support.  856-824-99617731465354 ? [place] DSS Where to apply for food stamps, Medicaid and utility assistance. 214-865-4298845-173-6334 ? [facility]: Transportation to medical appointments. 435-757-9589(564) 732-3708 ? Social Security Administration: May apply for disability if have a Stage IV cancer. 705-768-52433024586524 906-393-66191-3182388474 ? [place] Aging, Disability and Transit Services: Assists with nutrition, care and transit needs. (306)341-9472(786)685-9605  Cancer Center Support Programs: @10RELATIVEDAYS @ > Cancer Support Group  2nd Tuesday of the month 1pm-2pm, Journey Room  > Creative Journey  3rd Tuesday of the month 1130am-1pm, Journey Room  > Look Good Feel Better  1st Wednesday of the month 10am-12 noon, Journey Room (Call American Cancer Society to register [phone])

## 2016-02-07 ENCOUNTER — Encounter: Payer: Self-pay | Admitting: Gastroenterology

## 2016-02-07 ENCOUNTER — Ambulatory Visit (INDEPENDENT_AMBULATORY_CARE_PROVIDER_SITE_OTHER): Payer: Medicare Other | Admitting: Gastroenterology

## 2016-02-07 VITALS — BP 107/70 | HR 67 | Temp 97.4°F | Ht 65.0 in | Wt 163.6 lb

## 2016-02-07 DIAGNOSIS — I2510 Atherosclerotic heart disease of native coronary artery without angina pectoris: Secondary | ICD-10-CM | POA: Diagnosis not present

## 2016-02-07 DIAGNOSIS — K7030 Alcoholic cirrhosis of liver without ascites: Secondary | ICD-10-CM | POA: Diagnosis not present
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
## 2016-02-07 NOTE — Patient Instructions (Signed)
I recommend blood work and an endoscopy, so just let us know when you are ready to pursue this.  I will see you in 3 months!

## 2016-02-08 ENCOUNTER — Ambulatory Visit (INDEPENDENT_AMBULATORY_CARE_PROVIDER_SITE_OTHER): Payer: Medicare Other | Admitting: Cardiology

## 2016-02-08 ENCOUNTER — Encounter: Payer: Self-pay | Admitting: Cardiology

## 2016-02-08 VITALS — BP 120/62 | HR 72 | Ht 61.0 in | Wt 163.0 lb

## 2016-02-08 DIAGNOSIS — I2510 Atherosclerotic heart disease of native coronary artery without angina pectoris: Secondary | ICD-10-CM

## 2016-02-08 DIAGNOSIS — I10 Essential (primary) hypertension: Secondary | ICD-10-CM

## 2016-02-08 NOTE — Patient Instructions (Signed)
Your physician wants you to follow-up in: 6 months You will receive a reminder letter in the mail two months in advance. If you don't receive a letter, please call our office to schedule the follow-up appointment.    Your physician recommends that you continue on your current medications as directed. Please refer to the Current Medication list given to you today.    If you need a refill on your cardiac medications before your next appointment, please call your pharmacy.     Thank you for choosing [facility] !        
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
## 2016-02-11 NOTE — Assessment & Plan Note (Signed)
Unintentional weight loss noted but declining initial screening colonoscopy and surveillance EGD, also declining blood work. Non-compliant with ETOH cessation. Well aware of risks including health decline and death. Surveillance EGD due now, but patient would like to postpone. He is agreeable to return in 3 months to discuss again. I am concerned regarding his prognosis, as he continues to drink and appears physically more frail than last visit. Discussed concerns with patient, who states understanding and still would like to decline any invasive procedure including endoscopic and laboratory evaluations currently.

## 2016-02-14 NOTE — Progress Notes (Signed)
CC'D TO PCP °

## 2016-02-23 ENCOUNTER — Other Ambulatory Visit: Payer: Self-pay | Admitting: Gastroenterology

## 2016-03-07 DIAGNOSIS — H2512 Age-related nuclear cataract, left eye: Secondary | ICD-10-CM | POA: Diagnosis not present

## 2016-03-14 DIAGNOSIS — H2511 Age-related nuclear cataract, right eye: Secondary | ICD-10-CM | POA: Diagnosis not present

## 2016-03-14 DIAGNOSIS — H25011 Cortical age-related cataract, right eye: Secondary | ICD-10-CM | POA: Diagnosis not present

## 2016-03-14 DIAGNOSIS — H25041 Posterior subcapsular polar age-related cataract, right eye: Secondary | ICD-10-CM | POA: Diagnosis not present

## 2016-03-20 ENCOUNTER — Other Ambulatory Visit: Payer: Self-pay | Admitting: Gastroenterology

## 2016-03-27 ENCOUNTER — Emergency Department (HOSPITAL_COMMUNITY): Payer: Medicare Other

## 2016-03-27 ENCOUNTER — Encounter (HOSPITAL_COMMUNITY): Payer: Self-pay | Admitting: Emergency Medicine

## 2016-03-27 ENCOUNTER — Inpatient Hospital Stay (HOSPITAL_COMMUNITY)
Admission: EM | Admit: 2016-03-27 | Discharge: 2016-03-30 | DRG: 641 | Disposition: A | Discharge status: Home / Self Care | Payer: Medicare Other | Attending: Family Medicine | Admitting: Family Medicine

## 2016-03-27 DIAGNOSIS — K7031 Alcoholic cirrhosis of liver with ascites: Secondary | ICD-10-CM | POA: Diagnosis present

## 2016-03-27 DIAGNOSIS — E860 Dehydration: Secondary | ICD-10-CM | POA: Diagnosis not present

## 2016-03-27 DIAGNOSIS — K7030 Alcoholic cirrhosis of liver without ascites: Secondary | ICD-10-CM | POA: Diagnosis present

## 2016-03-27 DIAGNOSIS — N390 Urinary tract infection, site not specified: Secondary | ICD-10-CM | POA: Diagnosis present

## 2016-03-27 DIAGNOSIS — W19XXXA Unspecified fall, initial encounter: Secondary | ICD-10-CM | POA: Diagnosis not present

## 2016-03-27 DIAGNOSIS — I951 Orthostatic hypotension: Secondary | ICD-10-CM | POA: Diagnosis not present

## 2016-03-27 DIAGNOSIS — F1010 Alcohol abuse, uncomplicated: Secondary | ICD-10-CM | POA: Diagnosis present

## 2016-03-27 DIAGNOSIS — Z8711 Personal history of peptic ulcer disease: Secondary | ICD-10-CM

## 2016-03-27 DIAGNOSIS — Z87891 Personal history of nicotine dependence: Secondary | ICD-10-CM

## 2016-03-27 DIAGNOSIS — D539 Nutritional anemia, unspecified: Secondary | ICD-10-CM | POA: Diagnosis not present

## 2016-03-27 DIAGNOSIS — I2510 Atherosclerotic heart disease of native coronary artery without angina pectoris: Secondary | ICD-10-CM | POA: Diagnosis present

## 2016-03-27 DIAGNOSIS — F1021 Alcohol dependence, in remission: Secondary | ICD-10-CM | POA: Diagnosis present

## 2016-03-27 DIAGNOSIS — E876 Hypokalemia: Secondary | ICD-10-CM | POA: Diagnosis not present

## 2016-03-27 DIAGNOSIS — S0101XA Laceration without foreign body of scalp, initial encounter: Secondary | ICD-10-CM | POA: Diagnosis not present

## 2016-03-27 DIAGNOSIS — D649 Anemia, unspecified: Secondary | ICD-10-CM | POA: Diagnosis not present

## 2016-03-27 DIAGNOSIS — N400 Enlarged prostate without lower urinary tract symptoms: Secondary | ICD-10-CM | POA: Diagnosis present

## 2016-03-27 DIAGNOSIS — Z8249 Family history of ischemic heart disease and other diseases of the circulatory system: Secondary | ICD-10-CM

## 2016-03-27 DIAGNOSIS — Z9081 Acquired absence of spleen: Secondary | ICD-10-CM

## 2016-03-27 DIAGNOSIS — Y92009 Unspecified place in unspecified non-institutional (private) residence as the place of occurrence of the external cause: Secondary | ICD-10-CM

## 2016-03-27 DIAGNOSIS — E861 Hypovolemia: Secondary | ICD-10-CM | POA: Diagnosis present

## 2016-03-27 DIAGNOSIS — K7460 Unspecified cirrhosis of liver: Secondary | ICD-10-CM | POA: Diagnosis present

## 2016-03-27 DIAGNOSIS — R531 Weakness: Secondary | ICD-10-CM | POA: Diagnosis not present

## 2016-03-27 DIAGNOSIS — Z87440 Personal history of urinary (tract) infections: Secondary | ICD-10-CM

## 2016-03-27 DIAGNOSIS — I10 Essential (primary) hypertension: Secondary | ICD-10-CM | POA: Diagnosis present

## 2016-03-27 DIAGNOSIS — S0990XA Unspecified injury of head, initial encounter: Secondary | ICD-10-CM | POA: Diagnosis not present

## 2016-03-27 DIAGNOSIS — S199XXA Unspecified injury of neck, initial encounter: Secondary | ICD-10-CM | POA: Diagnosis not present

## 2016-03-27 DIAGNOSIS — E871 Hypo-osmolality and hyponatremia: Secondary | ICD-10-CM | POA: Diagnosis present

## 2016-03-27 LAB — URINE MICROSCOPIC-ADD ON

## 2016-03-27 LAB — COMPREHENSIVE METABOLIC PANEL
ALBUMIN: 3.1 g/dL — AB (ref 3.5–5.0)
ALT: 25 U/L (ref 17–63)
AST: 72 U/L — AB (ref 15–41)
Alkaline Phosphatase: 115 U/L (ref 38–126)
Anion gap: 12 (ref 5–15)
BUN: 13 mg/dL (ref 6–20)
CHLORIDE: 83 mmol/L — AB (ref 101–111)
CO2: 20 mmol/L — ABNORMAL LOW (ref 22–32)
CREATININE: 1.1 mg/dL (ref 0.61–1.24)
Calcium: 7.8 mg/dL — ABNORMAL LOW (ref 8.9–10.3)
GFR calc Af Amer: >60 mL/min (ref >60)
GLUCOSE: 117 mg/dL — AB (ref 65–99)
POTASSIUM: 2.5 mmol/L — AB (ref 3.5–5.1)
Sodium: 115 mmol/L — CL (ref 135–145)
Total Bilirubin: 3.1 mg/dL — ABNORMAL HIGH (ref 0.3–1.2)
Total Protein: 8.3 g/dL — ABNORMAL HIGH (ref 6.5–8.1)

## 2016-03-27 LAB — RAPID URINE DRUG SCREEN, HOSP PERFORMED
AMPHETAMINES: NOT DETECTED
BARBITURATES: NOT DETECTED
BENZODIAZEPINES: NOT DETECTED
Cocaine: NOT DETECTED
Opiates: NOT DETECTED
TETRAHYDROCANNABINOL: NOT DETECTED

## 2016-03-27 LAB — MAGNESIUM: MAGNESIUM: 1.1 mg/dL — AB (ref 1.7–2.4)

## 2016-03-27 LAB — CBC WITH DIFFERENTIAL/PLATELET
BASOS ABS: 0 10*3/uL (ref 0.0–0.1)
Basophils Relative: 0 %
EOS ABS: 0.3 10*3/uL (ref 0.0–0.7)
Eosinophils Relative: 2 %
HCT: 29.3 % — ABNORMAL LOW (ref 39.0–52.0)
HEMOGLOBIN: 10.7 g/dL — AB (ref 13.0–17.0)
LYMPHS ABS: 4.2 10*3/uL — AB (ref 0.7–4.0)
LYMPHS PCT: 42 %
MCH: 38.2 pg — AB (ref 26.0–34.0)
MCHC: 36.5 g/dL — ABNORMAL HIGH (ref 30.0–36.0)
MCV: 104.6 fL — AB (ref 78.0–100.0)
Monocytes Absolute: 0.8 10*3/uL (ref 0.1–1.0)
Monocytes Relative: 8 %
NEUTROS PCT: 48 %
Neutro Abs: 4.9 10*3/uL (ref 1.7–7.7)
Platelets: 219 10*3/uL (ref 150–400)
RBC: 2.8 MIL/uL — AB (ref 4.22–5.81)
RDW: 15.2 % (ref 11.5–15.5)
WBC: 10.2 10*3/uL (ref 4.0–10.5)

## 2016-03-27 LAB — URINALYSIS, ROUTINE W REFLEX MICROSCOPIC
Bilirubin Urine: NEGATIVE
GLUCOSE, UA: NEGATIVE mg/dL
Ketones, ur: NEGATIVE mg/dL
Nitrite: NEGATIVE
PH: 6 (ref 5.0–8.0)
PROTEIN: NEGATIVE mg/dL

## 2016-03-27 LAB — TROPONIN I

## 2016-03-27 LAB — AMMONIA: AMMONIA: 40 umol/L — AB (ref 9–35)

## 2016-03-27 LAB — ETHANOL: ALCOHOL ETHYL (B): 12 mg/dL — AB (ref <5)

## 2016-03-27 LAB — PROTIME-INR
INR: 1.2
Prothrombin Time: 15.2 seconds (ref 11.4–15.2)

## 2016-03-27 IMAGING — CT CT CERVICAL SPINE W/O CM
4 of 8 series · 11 of 33 positions shown, 12 images · non-contrast
Comparison: [DATE]

CLINICAL DATA: Fell today with laceration to the back of the head.

EXAM:
CT HEAD WITHOUT CONTRAST
CT CERVICAL SPINE WITHOUT CONTRAST
TECHNIQUE: Multidetector CT imaging of the head and cervical spine was
performed following the standard protocol without intravenous
contrast. Multiplanar CT image reconstructions of the cervical spine
were also generated.

[Series 7: c spine soft · axial · 0.32mm/px · z∈[+65,+131]mm · 2 of 100 slices shown]
[im 34/100  soft-tissue]
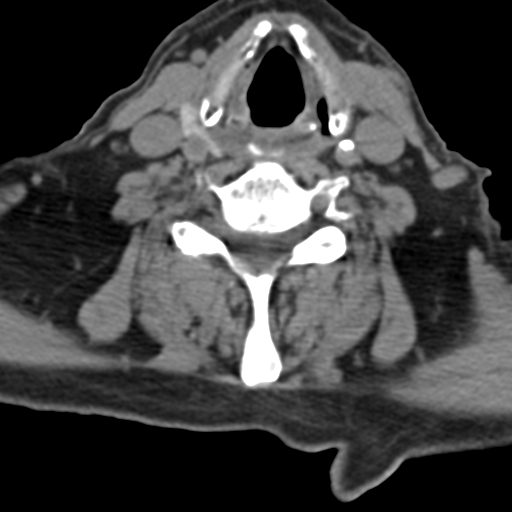
[im 67/100  soft-tissue]
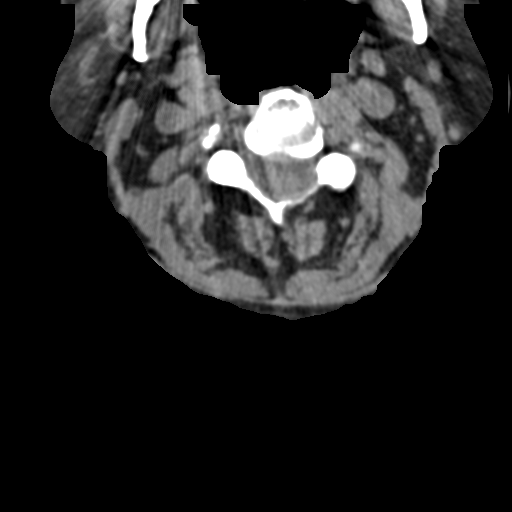

[Series 8: sagittal bone · sagittal · 0.19mm/px · 5 of 64 slices shown]
[im 11/64  bone]
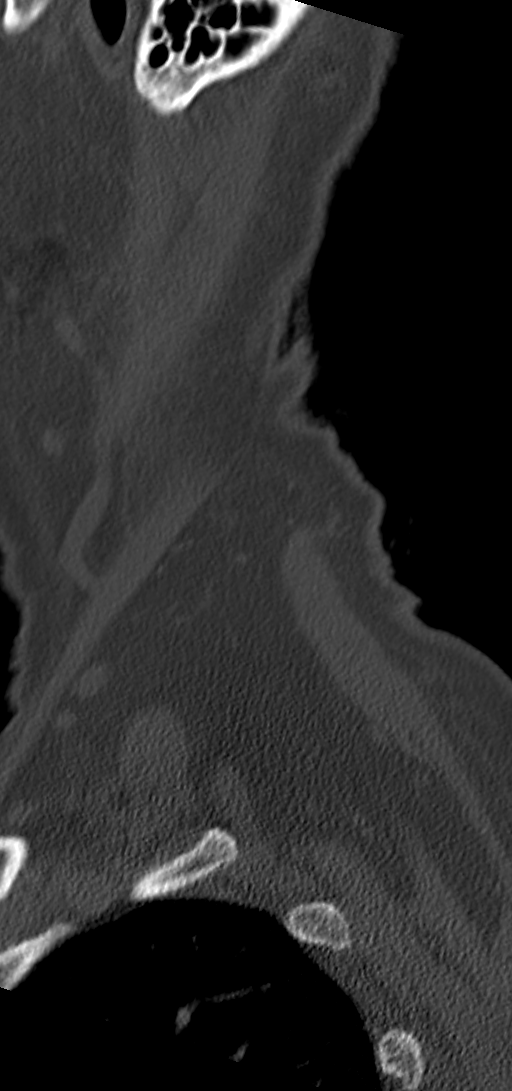
[im 22/64  bone]
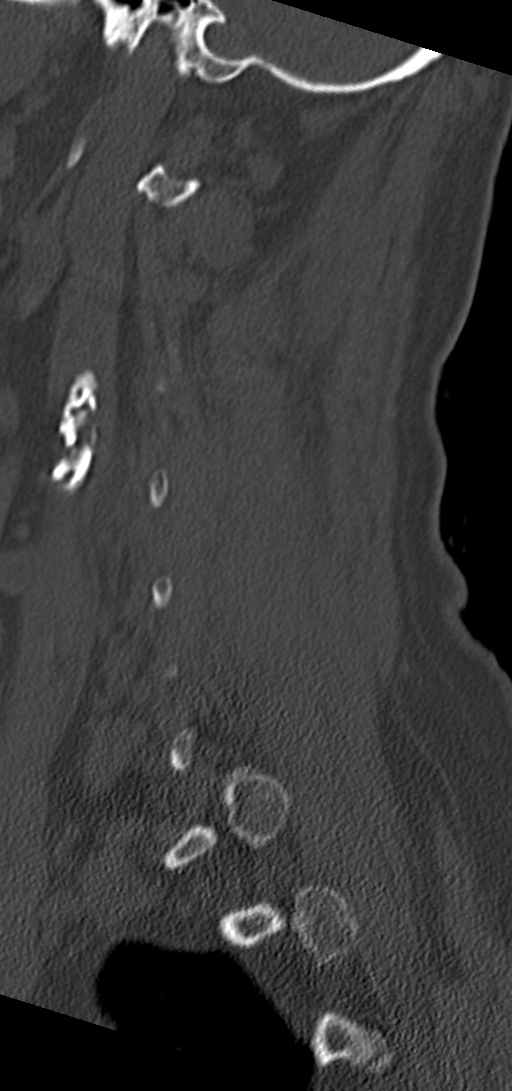
[im 32/64  bone]
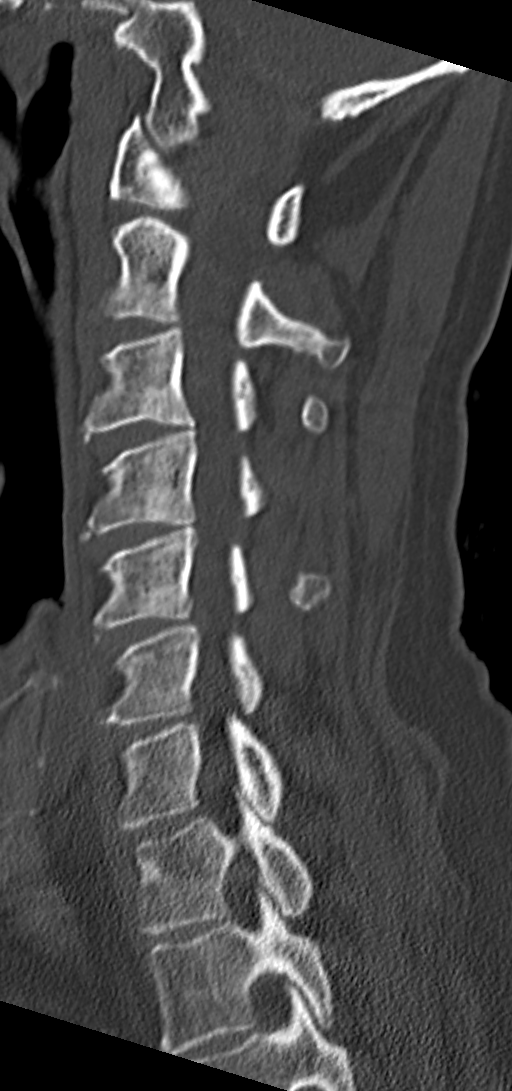
[im 43/64  bone]
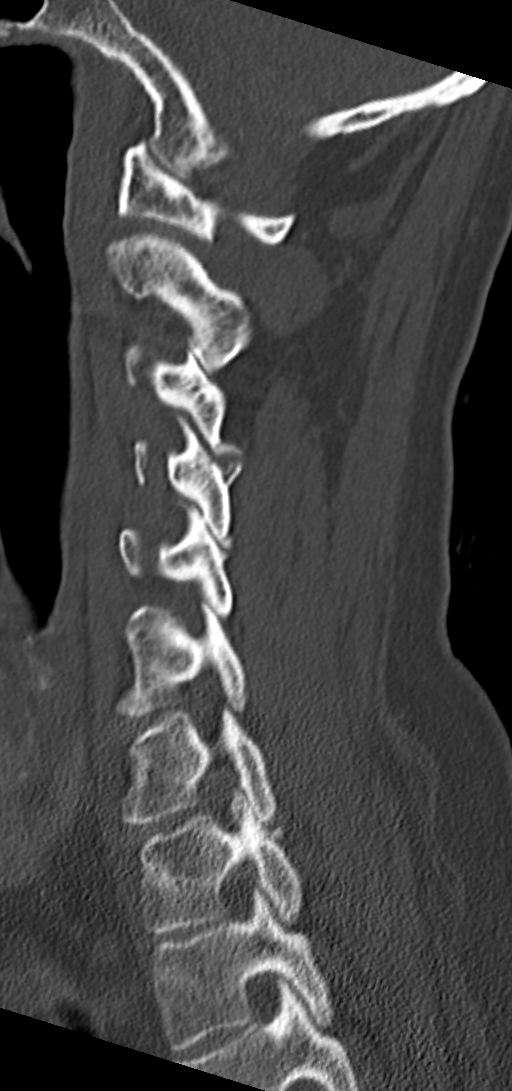
[im 53/64  bone]
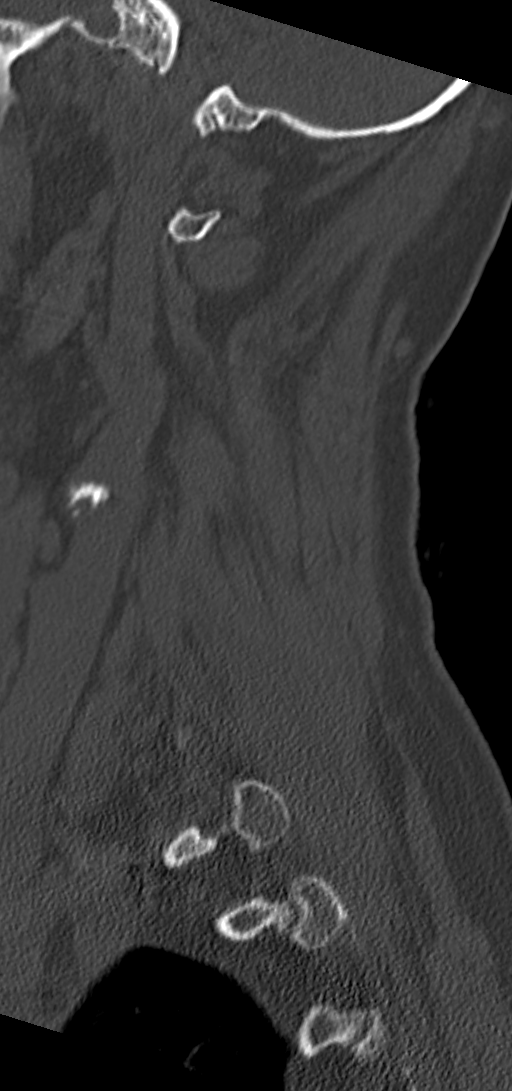

[Series 9: coronal bone · coronal · 0.21mm/px · 1 of 51 slices shown]
[im 26/51  bone]
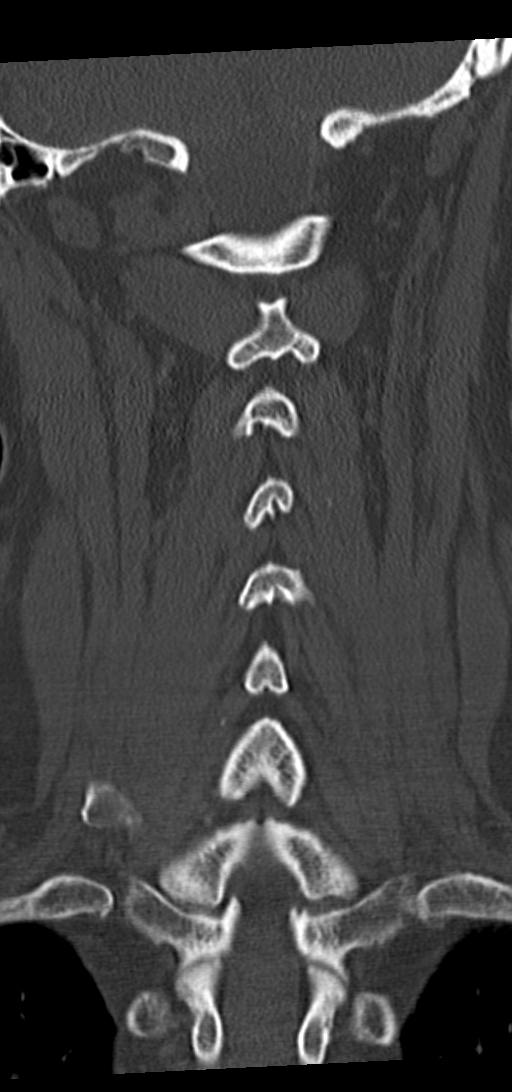

[Series 10: orthogonal axial · axial · 0.23mm/px · z∈[+28,+138]mm · 3 of 116 slices shown, 4 images]
[im 29/116  soft-tissue]
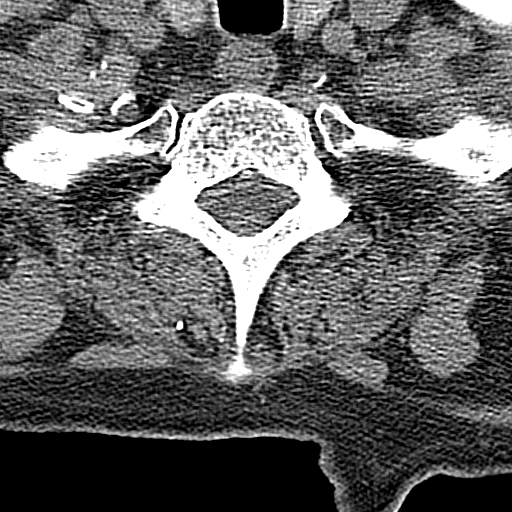
[im 29/116  bone]
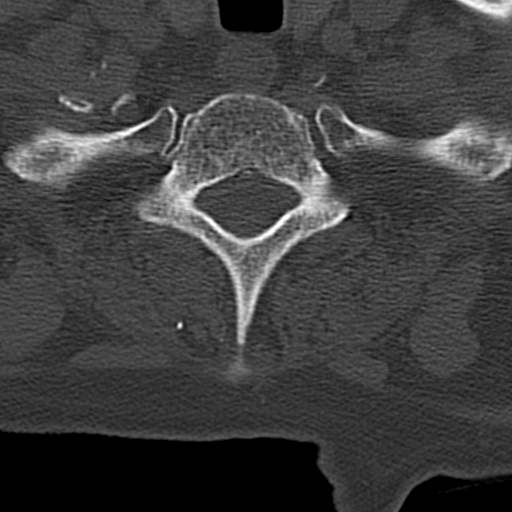
[im 58/116  bone]
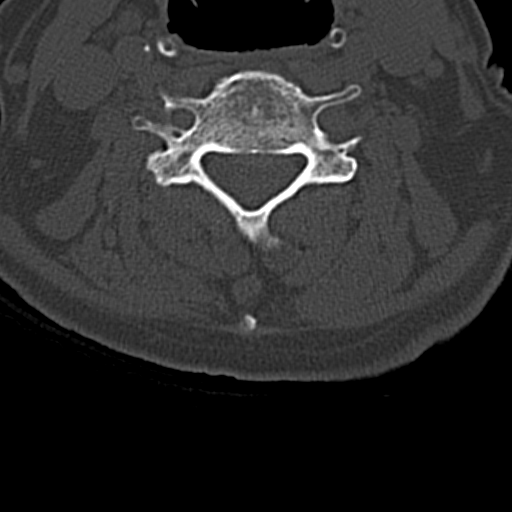
[im 87/116  bone]
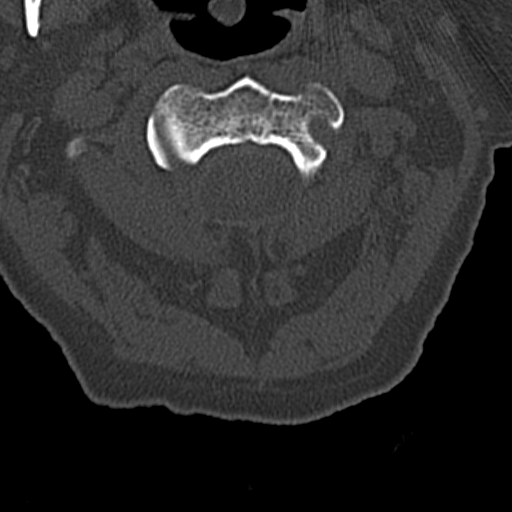

[11 of 33 positions shown; findings below may reference images not displayed]

FINDINGS: CT HEAD FINDINGS

The brain shows generalized atrophy. There are chronic small-vessel
ischemic changes affecting the basal ganglia and hemispheric white
matter. No sign of acute infarction, mass lesion, hemorrhage,
hydrocephalus or extra-axial collection. No skull fracture. No fluid
in the sinuses. There is atherosclerotic calcification of the major
vessels at the base of the brain.

CT CERVICAL SPINE FINDINGS

Alignment is normal. No fracture. No soft tissue swelling. Facet
osteoarthritis on the right at C3-4 and C4-5. Minimal mid cervical
spondylosis. There is advanced calcification in the carotid
bifurcation regions.
IMPRESSION: Head CT: No acute or traumatic finding. Atrophy and chronic small
vessel disease.

Cervical spine CT: No acute or traumatic finding. Right-sided facet
arthropathy C3-4 and C4-5. Advanced atherosclerotic calcification in
the carotid bifurcation regions.

## 2016-03-27 IMAGING — DX DG CHEST 2V
2 series · 2 of 2 positions shown · non-contrast
Comparison: Chest radiograph dated [DATE]

CLINICAL DATA: 65-year-old male with fall and generalized weakness

EXAM:
CHEST  2 VIEW

[chest pa]
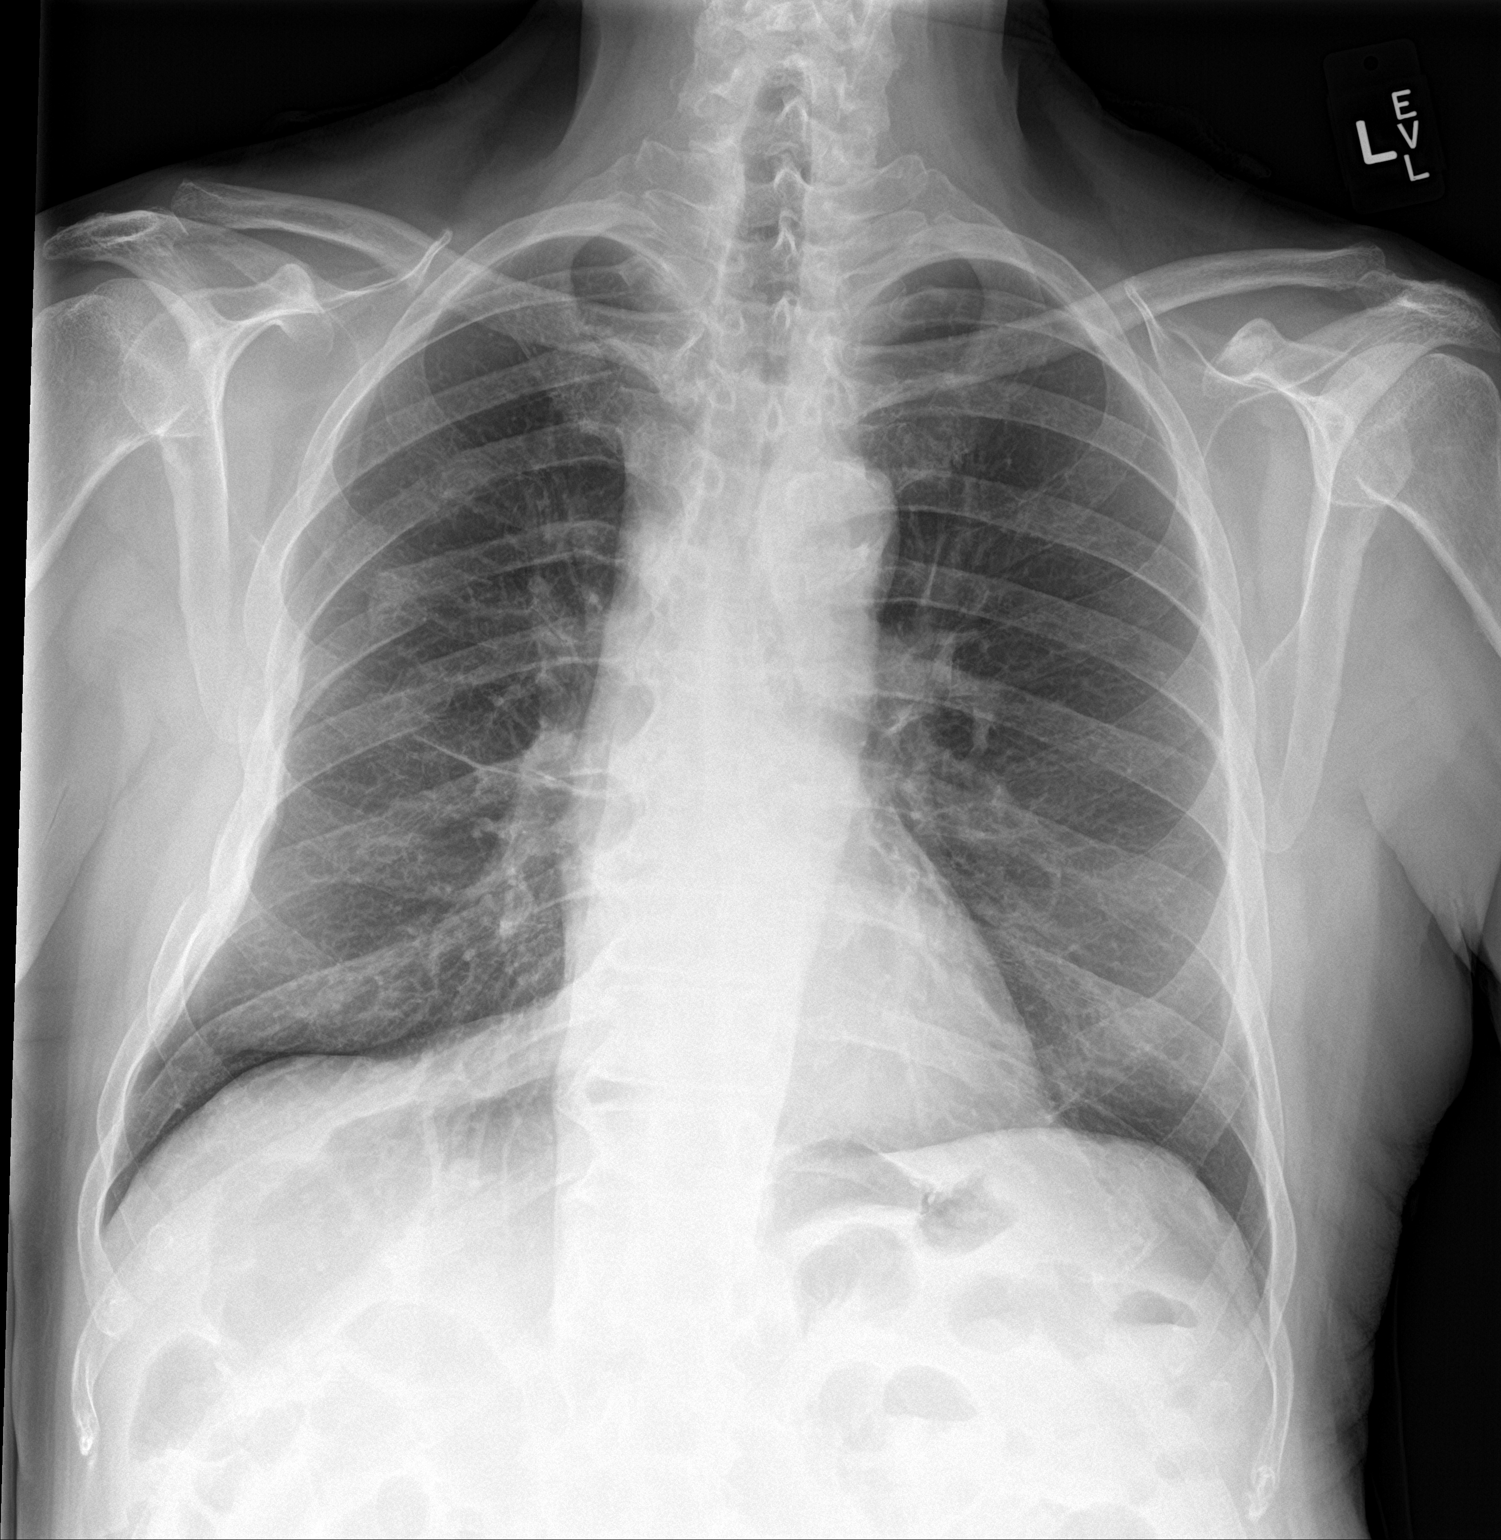

[chest lat]
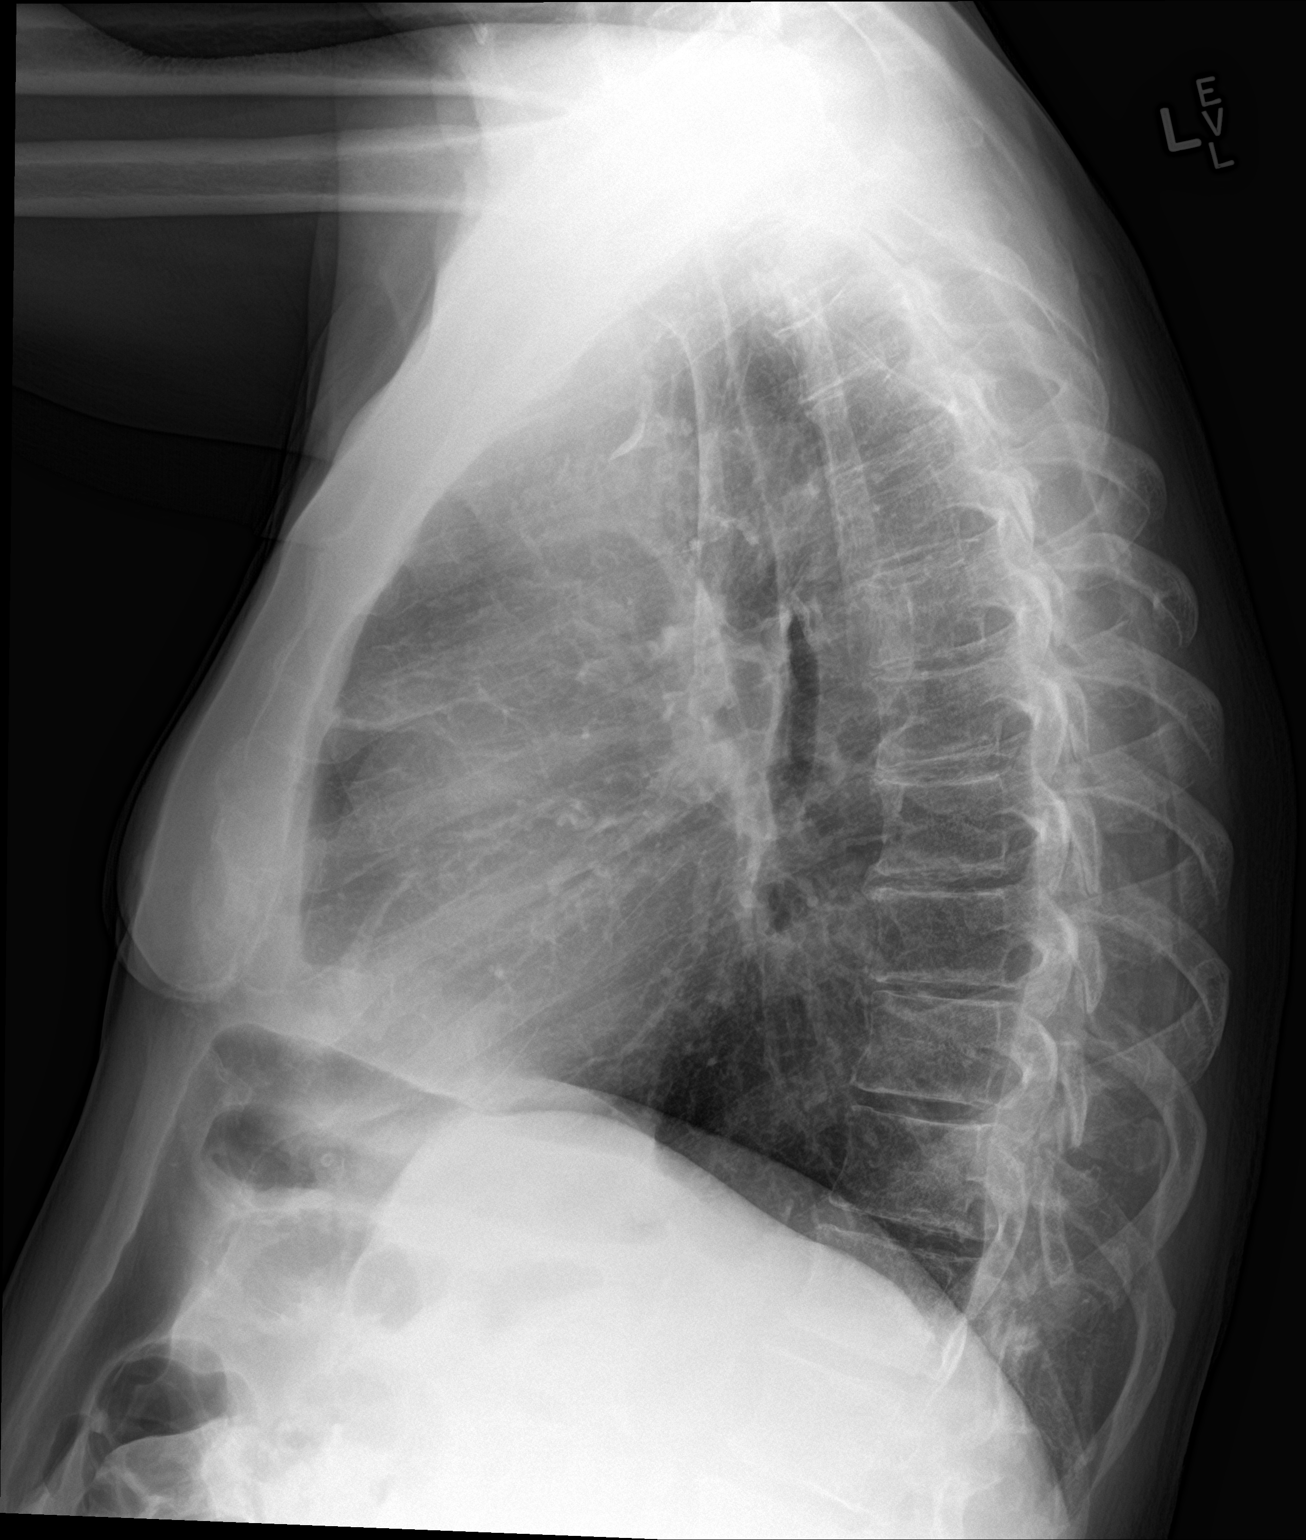

[2 of 2 positions shown; findings below may reference images not displayed]

FINDINGS: The heart size and mediastinal contours are within normal limits.
Both lungs are clear. The visualized skeletal structures are
unremarkable.
IMPRESSION: No active cardiopulmonary disease.

## 2016-03-27 MED ORDER — LIDOCAINE-EPINEPHRINE (PF) 2 %-1:200000 IJ SOLN
INTRAMUSCULAR | Status: AC
  Administered 2016-03-27: 23:00:00
  Filled 2016-03-27: qty 20

## 2016-03-27 MED ORDER — SODIUM CHLORIDE 0.9 % IV SOLN
INTRAVENOUS | Status: DC
  Administered 2016-03-28: 100 mL/h via INTRAVENOUS
  Administered 2016-03-28: 1000 mL via INTRAVENOUS

## 2016-03-27 MED ORDER — TETANUS-DIPHTH-ACELL PERTUSSIS 5-2.5-18.5 LF-MCG/0.5 IM SUSP
0.5000 mL | Freq: Once | INTRAMUSCULAR | Status: AC
  Administered 2016-03-27: 0.5 mL via INTRAMUSCULAR
  Filled 2016-03-27: qty 0.5

## 2016-03-27 MED ORDER — SODIUM CHLORIDE 0.9 % IV BOLUS (SEPSIS)
500.0000 mL | Freq: Once | INTRAVENOUS | Status: AC
  Administered 2016-03-27: 500 mL via INTRAVENOUS

## 2016-03-27 MED ORDER — POTASSIUM CHLORIDE CRYS ER 20 MEQ PO TBCR
40.0000 meq | EXTENDED_RELEASE_TABLET | Freq: Once | ORAL | Status: AC
  Administered 2016-03-27: 40 meq via ORAL
  Filled 2016-03-27: qty 2

## 2016-03-27 MED ORDER — MAGNESIUM SULFATE 2 GM/50ML IV SOLN
2.0000 g | Freq: Once | INTRAVENOUS | Status: AC
  Administered 2016-03-27: 2 g via INTRAVENOUS
  Filled 2016-03-27: qty 50

## 2016-03-27 NOTE — ED Provider Notes (Signed)
   I was asked by my attending physician, Dr. Clarene Duke, to repair this patient's scalp laceration.  This was my only involvement in this patient's care.   LACERATION REPAIR Performed by: Ledarrius Beauchaine L. Authorized by: Maxwell Caul Consent: Verbal consent obtained. Risks and benefits: risks, benefits and alternatives were discussed Consent given by: patient Patient identity confirmed: provided demographic data Prepped and Draped in normal sterile fashion Wound explored  Laceration Location: posterior scalp  Laceration Length: 5 cm  No Foreign Bodies seen or palpated  Anesthesia: local infiltration  Local anesthetic: lidocaine 2% w/ epinephrine  Anesthetic total: 3 ml  Irrigation method: syringe Amount of cleaning: standard  Skin closure: staples  Number of staples: 7  Patient tolerance: Patient tolerated the procedure well with no immediate complications.    Wound edges well approximated, bleeding controlled prior to closure.  Dressing applied.  Td updated.    Pauline Aus, PA-C 03/27/16 2153    Samuel Jester, DO 03/29/16 [phone]

## 2016-03-27 NOTE — ED Triage Notes (Signed)
Pt fell today, head lac to back of head. Pt states he has been weak the past few days.

## 2016-03-27 NOTE — ED Notes (Signed)
CRITICAL VALUE ALERT  Critical value received:  Potassium 2.5  Date of notification:  03/27/2016  Time of notification:  2215  Critical value read back: yes  Nurse who received alert: Tenna Delaine, RN  MD notified (1st page):  Clarene Duke  Time of first page:  2216  MD notified (2nd page):  Time of second page:  Responding MD:  Clarene Duke  Time MD responded: 2216

## 2016-03-27 NOTE — ED Notes (Signed)
POC Occult Blood was obtained by Dr. Clarene Duke. Hemoccult was negative.
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
## 2016-03-27 NOTE — ED Notes (Signed)
Pt made aware that we needed a urine specimen. Pt told this RN that he self caths. This RN was standing outside of his door. This RN heard urine hitting the floor. Pt was helped with urinal.  Pt ws using a cath that appeared had been used before.

## 2016-03-27 NOTE — ED Notes (Signed)
CRITICAL VALUE ALERT  Critical value received:  Sodium 115  Date of notification:  03/27/2016  Time of notification:  2213  Critical value read back: yes  Nurse who received alert:  Tenna Delaine, RN  MD notified (1st page): Dr. Clarene Duke  Time of first page:  2214  MD notified (2nd page):  Time of second page:  Responding MD:  Dr. Clarene Duke  Time MD responded:  2215
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
## 2016-03-28 ENCOUNTER — Encounter (HOSPITAL_COMMUNITY): Payer: Self-pay

## 2016-03-28 DIAGNOSIS — Y92009 Unspecified place in unspecified non-institutional (private) residence as the place of occurrence of the external cause: Secondary | ICD-10-CM | POA: Diagnosis not present

## 2016-03-28 DIAGNOSIS — E861 Hypovolemia: Secondary | ICD-10-CM | POA: Diagnosis present

## 2016-03-28 DIAGNOSIS — E871 Hypo-osmolality and hyponatremia: Secondary | ICD-10-CM | POA: Diagnosis not present

## 2016-03-28 DIAGNOSIS — I951 Orthostatic hypotension: Secondary | ICD-10-CM | POA: Diagnosis present

## 2016-03-28 DIAGNOSIS — E876 Hypokalemia: Secondary | ICD-10-CM | POA: Diagnosis not present

## 2016-03-28 DIAGNOSIS — S0101XA Laceration without foreign body of scalp, initial encounter: Secondary | ICD-10-CM | POA: Diagnosis present

## 2016-03-28 DIAGNOSIS — Z9081 Acquired absence of spleen: Secondary | ICD-10-CM | POA: Diagnosis not present

## 2016-03-28 DIAGNOSIS — F1010 Alcohol abuse, uncomplicated: Secondary | ICD-10-CM

## 2016-03-28 DIAGNOSIS — I10 Essential (primary) hypertension: Secondary | ICD-10-CM | POA: Diagnosis present

## 2016-03-28 DIAGNOSIS — Z87440 Personal history of urinary (tract) infections: Secondary | ICD-10-CM | POA: Diagnosis not present

## 2016-03-28 DIAGNOSIS — N390 Urinary tract infection, site not specified: Secondary | ICD-10-CM | POA: Diagnosis present

## 2016-03-28 DIAGNOSIS — W19XXXA Unspecified fall, initial encounter: Secondary | ICD-10-CM

## 2016-03-28 DIAGNOSIS — I2510 Atherosclerotic heart disease of native coronary artery without angina pectoris: Secondary | ICD-10-CM | POA: Diagnosis present

## 2016-03-28 DIAGNOSIS — Z8711 Personal history of peptic ulcer disease: Secondary | ICD-10-CM | POA: Diagnosis not present

## 2016-03-28 DIAGNOSIS — Z87891 Personal history of nicotine dependence: Secondary | ICD-10-CM | POA: Diagnosis not present

## 2016-03-28 DIAGNOSIS — K7030 Alcoholic cirrhosis of liver without ascites: Secondary | ICD-10-CM

## 2016-03-28 DIAGNOSIS — Z8249 Family history of ischemic heart disease and other diseases of the circulatory system: Secondary | ICD-10-CM | POA: Diagnosis not present

## 2016-03-28 DIAGNOSIS — D539 Nutritional anemia, unspecified: Secondary | ICD-10-CM | POA: Diagnosis present

## 2016-03-28 DIAGNOSIS — N400 Enlarged prostate without lower urinary tract symptoms: Secondary | ICD-10-CM | POA: Diagnosis present

## 2016-03-28 DIAGNOSIS — E860 Dehydration: Secondary | ICD-10-CM | POA: Diagnosis present

## 2016-03-28 LAB — BASIC METABOLIC PANEL
Anion gap: 10 (ref 5–15)
BUN: 12 mg/dL (ref 6–20)
CHLORIDE: 89 mmol/L — AB (ref 101–111)
CO2: 20 mmol/L — ABNORMAL LOW (ref 22–32)
CREATININE: 0.93 mg/dL (ref 0.61–1.24)
Calcium: 7.5 mg/dL — ABNORMAL LOW (ref 8.9–10.3)
GFR calc Af Amer: >60 mL/min (ref >60)
GLUCOSE: 127 mg/dL — AB (ref 65–99)
POTASSIUM: 2.5 mmol/L — AB (ref 3.5–5.1)
SODIUM: 119 mmol/L — AB (ref 135–145)

## 2016-03-28 LAB — IRON AND TIBC
Iron: 155 ug/dL (ref 45–182)
SATURATION RATIOS: 90 % — AB (ref 17.9–39.5)
TIBC: 172 ug/dL — AB (ref 250–450)
UIBC: 17 ug/dL

## 2016-03-28 LAB — CBC
HEMATOCRIT: 29.9 % — AB (ref 39.0–52.0)
HEMATOCRIT: 27.7 % — AB (ref 39.0–52.0)
HEMOGLOBIN: 11.1 g/dL — AB (ref 13.0–17.0)
HEMOGLOBIN: 10.2 g/dL — AB (ref 13.0–17.0)
MCH: 38.8 pg — AB (ref 26.0–34.0)
MCH: 38.6 pg — AB (ref 26.0–34.0)
MCHC: 37.1 g/dL — ABNORMAL HIGH (ref 30.0–36.0)
MCHC: 36.8 g/dL — AB (ref 30.0–36.0)
MCV: 104.5 fL — AB (ref 78.0–100.0)
MCV: 104.9 fL — AB (ref 78.0–100.0)
PLATELETS: 218 10*3/uL (ref 150–400)
Platelets: 196 10*3/uL (ref 150–400)
RBC: 2.86 MIL/uL — AB (ref 4.22–5.81)
RBC: 2.64 MIL/uL — AB (ref 4.22–5.81)
RDW: 15.2 % (ref 11.5–15.5)
RDW: 15.3 % (ref 11.5–15.5)
WBC: 10.1 10*3/uL (ref 4.0–10.5)
WBC: 9.9 10*3/uL (ref 4.0–10.5)

## 2016-03-28 LAB — OSMOLALITY: OSMOLALITY: 255 mosm/kg — AB (ref 275–295)

## 2016-03-28 LAB — RETICULOCYTES
RBC.: 2.86 MIL/uL — AB (ref 4.22–5.81)
RETIC CT PCT: 3.6 % — AB (ref 0.4–3.1)
Retic Count, Absolute: 103 10*3/uL (ref 19.0–186.0)

## 2016-03-28 LAB — VITAMIN B12: Vitamin B-12: 5645 pg/mL — ABNORMAL HIGH (ref 180–914)

## 2016-03-28 LAB — MAGNESIUM: Magnesium: 1.6 mg/dL — ABNORMAL LOW (ref 1.7–2.4)

## 2016-03-28 LAB — TSH: TSH: 1.369 u[IU]/mL (ref 0.350–4.500)

## 2016-03-28 LAB — FERRITIN: FERRITIN: 1138 ng/mL — AB (ref 24–336)

## 2016-03-28 LAB — COMPREHENSIVE METABOLIC PANEL
ALK PHOS: 92 U/L (ref 38–126)
ALT: 21 U/L (ref 17–63)
ANION GAP: 10 (ref 5–15)
AST: 54 U/L — ABNORMAL HIGH (ref 15–41)
Albumin: 2.5 g/dL — ABNORMAL LOW (ref 3.5–5.0)
BILIRUBIN TOTAL: 2.8 mg/dL — AB (ref 0.3–1.2)
BUN: 11 mg/dL (ref 6–20)
CALCIUM: 7.5 mg/dL — AB (ref 8.9–10.3)
CO2: 20 mmol/L — AB (ref 22–32)
CREATININE: 0.9 mg/dL (ref 0.61–1.24)
Chloride: 91 mmol/L — ABNORMAL LOW (ref 101–111)
Glucose, Bld: 114 mg/dL — ABNORMAL HIGH (ref 65–99)
Potassium: 2.6 mmol/L — CL (ref 3.5–5.1)
SODIUM: 121 mmol/L — AB (ref 135–145)
TOTAL PROTEIN: 6.8 g/dL (ref 6.5–8.1)

## 2016-03-28 LAB — OSMOLALITY, URINE: Osmolality, Ur: 90 mOsm/kg — ABNORMAL LOW (ref 300–900)

## 2016-03-28 LAB — FOLATE: Folate: 4.1 ng/mL — ABNORMAL LOW (ref >5.9)

## 2016-03-28 LAB — MRSA PCR SCREENING: MRSA by PCR: NEGATIVE

## 2016-03-28 MED ORDER — ENOXAPARIN SODIUM 40 MG/0.4ML ~~LOC~~ SOLN: 40.0000 mg | SUBCUTANEOUS | Status: DC

## 2016-03-28 MED ORDER — SODIUM CHLORIDE 0.9 % IV BOLUS (SEPSIS)
500.0000 mL | Freq: Once | INTRAVENOUS | Status: AC
  Administered 2016-03-28: 500 mL via INTRAVENOUS

## 2016-03-28 MED ORDER — MAGNESIUM SULFATE 2 GM/50ML IV SOLN
2.0000 g | Freq: Once | INTRAVENOUS | Status: AC
  Administered 2016-03-28: 2 g via INTRAVENOUS
  Filled 2016-03-28: qty 50

## 2016-03-28 MED ORDER — CIPROFLOXACIN IN D5W 400 MG/200ML IV SOLN
400.0000 mg | Freq: Once | INTRAVENOUS | Status: AC
  Administered 2016-03-28: 400 mg via INTRAVENOUS
  Filled 2016-03-28: qty 200

## 2016-03-28 MED ORDER — LORAZEPAM 2 MG/ML IJ SOLN
1.0000 mg | INTRAMUSCULAR | Status: DC | PRN
  Administered 2016-03-28: 2 mg via INTRAVENOUS
  Filled 2016-03-28: qty 1

## 2016-03-28 MED ORDER — ONDANSETRON HCL 4 MG PO TABS: 4.0000 mg | ORAL_TABLET | Freq: Four times a day (QID) | ORAL | Status: DC | PRN

## 2016-03-28 MED ORDER — POTASSIUM CHLORIDE 10 MEQ/100ML IV SOLN
10.0000 meq | INTRAVENOUS | Status: AC
  Administered 2016-03-28 (×4): 10 meq via INTRAVENOUS
  Filled 2016-03-28 (×4): qty 100

## 2016-03-28 MED ORDER — SODIUM CHLORIDE 0.9 % IV SOLN
INTRAVENOUS | Status: DC
  Administered 2016-03-28: 100 mL/h via INTRAVENOUS

## 2016-03-28 MED ORDER — POTASSIUM CHLORIDE 10 MEQ/50ML IV SOLN
10.0000 meq | INTRAVENOUS | Status: DC | PRN
  Filled 2016-03-28: qty 50

## 2016-03-28 MED ORDER — ACETAMINOPHEN 500 MG PO TABS
500.0000 mg | ORAL_TABLET | Freq: Four times a day (QID) | ORAL | Status: DC | PRN
  Administered 2016-03-28 (×2): 1000 mg via ORAL
  Filled 2016-03-28 (×2): qty 2

## 2016-03-28 MED ORDER — POTASSIUM CHLORIDE CRYS ER 20 MEQ PO TBCR
40.0000 meq | EXTENDED_RELEASE_TABLET | ORAL | Status: AC
  Administered 2016-03-28 (×3): 40 meq via ORAL
  Filled 2016-03-28 (×3): qty 2

## 2016-03-28 MED ORDER — FOLIC ACID 1 MG PO TABS
1.0000 mg | ORAL_TABLET | Freq: Every day | ORAL | Status: DC
  Administered 2016-03-28: 1 mg via ORAL
  Filled 2016-03-28 (×2): qty 1

## 2016-03-28 MED ORDER — POTASSIUM CHLORIDE IN NACL 40-0.9 MEQ/L-% IV SOLN
INTRAVENOUS | Status: DC
  Administered 2016-03-28 – 2016-03-29 (×2): 100 mL/h via INTRAVENOUS

## 2016-03-28 MED ORDER — VITAMIN B-1 100 MG PO TABS
100.0000 mg | ORAL_TABLET | Freq: Every day | ORAL | Status: DC
  Administered 2016-03-28: 100 mg via ORAL
  Filled 2016-03-28 (×2): qty 1

## 2016-03-28 MED ORDER — ENOXAPARIN SODIUM 40 MG/0.4ML ~~LOC~~ SOLN
40.0000 mg | SUBCUTANEOUS | Status: DC
Start: 2016-03-28 — End: 2016-03-30
  Administered 2016-03-28 – 2016-03-30 (×3): 40 mg via SUBCUTANEOUS
  Filled 2016-03-28 (×3): qty 0.4

## 2016-03-28 MED ORDER — CIPROFLOXACIN IN D5W 400 MG/200ML IV SOLN
400.0000 mg | Freq: Two times a day (BID) | INTRAVENOUS | Status: DC
  Administered 2016-03-28 (×2): 400 mg via INTRAVENOUS
  Filled 2016-03-28 (×3): qty 200

## 2016-03-28 MED ORDER — SODIUM CHLORIDE 0.9 % IV SOLN: INTRAVENOUS | Status: DC

## 2016-03-28 MED ORDER — ONDANSETRON HCL 4 MG/2ML IJ SOLN: 4.0000 mg | Freq: Four times a day (QID) | INTRAMUSCULAR | Status: DC | PRN

## 2016-03-28 MED ORDER — FLUTICASONE PROPIONATE 50 MCG/ACT NA SUSP
1.0000 | Freq: Every day | NASAL | Status: DC
  Administered 2016-03-28 – 2016-03-29 (×2): 1 via NASAL
  Filled 2016-03-28 (×2): qty 16

## 2016-03-28 NOTE — Progress Notes (Signed)
ANTIBIOTIC CONSULT NOTE  Pharmacy Consult for ciprofloxacin Indication: UTI  Allergies  Allergen Reactions  . Amlodipine Besylate     REACTION: Feet swell  . Penicillins Other (See Comments)    Childhood allergy    Patient Measurements: Height: 5\' 5"  (165.1 cm) Weight: 151 lb 10.8 oz (68.8 kg) IBW/kg (Calculated) : 61.5   Vital Signs: Temp: 97.9 F (36.6 C) (08/01 0752) Temp Source: Oral (08/01 0752) BP: 80/47 (08/01 0600) Pulse Rate: 73 (08/01 0600)  Labs:  Recent Labs  03/27/16 2120 03/28/16 0140 03/28/16 0511  WBC 10.2 10.1 9.9  HGB 10.7* 11.1* 10.2*  PLT 219 218 196  CREATININE 1.10 0.93 0.90    Estimated Creatinine Clearance: 69.3 mL/min (by C-G formula based on SCr of 0.9 mg/dL).  No results for input(s): VANCOTROUGH, VANCOPEAK, VANCORANDOM, GENTTROUGH, GENTPEAK, GENTRANDOM, TOBRATROUGH, TOBRAPEAK, TOBRARND, AMIKACINPEAK, AMIKACINTROU, AMIKACIN in the last 72 hours.   Microbiology: Recent Results (from the past 720 hour(s))  MRSA PCR Screening     Status: None   Collection Time: 03/28/16 12:16 AM  Result Value Ref Range Status   MRSA by PCR NEGATIVE NEGATIVE Final    Comment:        The GeneXpert MRSA Assay (FDA approved for NASAL specimens only), is one component of a comprehensive MRSA colonization surveillance program. It is not intended to diagnose MRSA infection nor to guide or monitor treatment for MRSA infections.     Medical History: Past Medical History:  Diagnosis Date  . Alcoholism (HCC)   . Allergic rhinitis   . Bronchitis   . CAD (coronary artery disease), native coronary artery    Multivessel at cardiac catheterization 1999 - managed medically by Dr. Amil Amen  . Essential hypertension   . Glucose intolerance (pre-diabetes)   . History of SIADH   . History of UTI   . Leukocytosis 06/28/2015  . Prostatic hypertrophy   . Rib fractures   . Ulcer of the stomach and intestine     Medications:  Scheduled:  . ciprofloxacin   400 mg Intravenous Q12H  . enoxaparin (LOVENOX) injection  40 mg Subcutaneous Q24H  . fluticasone  1 spray Each Nare Daily  . folic acid  1 mg Oral Daily  . potassium chloride  10 mEq Intravenous Q1 Hr x 4  . thiamine  100 mg Oral Daily   Infusions:  . 0.9 % NaCl with KCl 40 mEq / L     PRN: acetaminophen, LORazepam, ondansetron **OR** ondansetron (ZOFRAN) IV Anti-infectives    Start     Dose/Rate Route Frequency Ordered Stop   03/28/16 1000  ciprofloxacin (CIPRO) IVPB 400 mg     400 mg 200 mL/hr over 60 Minutes Intravenous Every 12 hours 03/28/16 0737     03/28/16 0130  ciprofloxacin (CIPRO) IVPB 400 mg     400 mg 200 mL/hr over 60 Minutes Intravenous  Once 03/28/16 0118 03/28/16 0302      Assessment: 65 yo with hx alcohol abuse and chronic hyponatremia. Pt fell at home, cutting his head.   Starting cipro for UA showing WBC to numerous to count.   Goal of Therapy:  eradication infeciton  Plan:  Cipro 400mg  IV q12hrs Monitor labs, progress, c/s  Valrie Hart, PharmD Clinical Pharmacist Pager:  [phone] 03/28/2016

## 2016-03-28 NOTE — Progress Notes (Signed)
Dr Sharl Ma paged with 0200 lab results  k 2.5 and na 119 .Marland Kitchen Awaiting orders

## 2016-03-28 NOTE — Progress Notes (Signed)
PROGRESS NOTE    Billy Stone  ZOX:[id number] DOB: 1950/11/05 DOA: 03/27/2016 PCP: [name], MD  Outpatient Specialists:  Cardiology: Jonelle Sidle, MD  Gastroenterology: Corbin Ade, MD    Brief Narrative: 65 yom with a hx of liver cirrhosis, chronic hyponatremia, alcohol abuse, presented to the ED after falling backwards at home. While in the ED, pt was noted to be hypotensive, Sodium 115. CT head unremarkable. Pt was admitted for further evaluation of his fall.   Assessment & Plan:   Active Problems:   Alcohol abuse   Cirrhosis of liver (HCC)   Hypokalemia   Hyponatremia   Fall  1. Fall. CT head unremarkable. CT spinal showed no acute findings. CXR unremarkable. Likely related to hypovolemia and orthostasis. Once stable, follow up with PT. 2. Hyponatremia. Hypovolemic. Likely related to poor po intake and alcohol. Improving. Sodium at 121. Continue IV fluids 3. Hypokalemia. Improving. Likely related to poor po intake. Potassium at 2.6. Continue potassium supplementation. 4. Hypomagnesemia. Replaced yesterday. Recheck labs. 5. Chronic Anemia. Hgb 10.2. Macrocytic, likely related to alcohol abuse. No evidence of bleeding. Anemia panel has been ordered. fobt has been ordered. hgb stable. 6. UTI. UA shows possible UTI. Start on IV cipro. Follow up urine culture 7. Cirrhosis of liver. AST 54. Continue to hold diureticsdue to hypotension. 8. Alcohol abuse. Placed on CIWA protocol. No signs of withdrawal at this time 9. Hypotension. Likely related to volume depletion. Metoprolol and diuretics currently on hold. Continue with IV fluids.    DVT prophylaxis: lovenox Code Status: Full Family Communication: no family present Disposition Plan: Discharge once improved   Consultants:   None  Procedures:   None  Antimicrobials:   cipro 7/31>>   Subjective: No complaints. Feeling better. No shortness of breath. No lightheadedness  Objective: Vitals:   03/28/16 0400 03/28/16 0441 03/28/16 0500 03/28/16 0600  BP: (!) 92/53 (!) 78/45 (!) 78/49 (!) 80/47  Pulse: 65 65 66 73  Resp: Temp: 97.2 F (36.2 C)     TempSrc: Oral     SpO2: 100% 100% 100% 100%  Weight:      Height:        Intake/Output Summary (Last 24 hours) at 03/28/16 0634 Last data filed at 03/28/16 0039  Gross per 24 hour  Intake                0 ml  Output              350 ml  Net             -350 ml   Filed Weights   03/27/16 1926 03/28/16 0025  Weight: 65.8 kg (145 lb) 68.8 kg (151 lb 10.8 oz)    Examination:  General exam: Appears calm and comfortable  Respiratory system: Clear to auscultation. Respiratory effort normal. Cardiovascular system: S1 & S2 heard, RRR. No JVD, murmurs, rubs, gallops or clicks. No pedal edema. Gastrointestinal system: Abdomen is nondistended, soft and nontender. No organomegaly or masses felt. Normal bowel sounds heard. Central nervous system: Alert and oriented. No focal neurological deficits. Extremities: Symmetric 5 x 5 power. Skin: No rashes, lesions or ulcers Psychiatry: Judgement and insight appear normal. Mood & affect appropriate.     Data Reviewed:  CBC:  Recent Labs Lab 03/27/16 2120 03/28/16 0140 03/28/16 0511  WBC 10.2 10.1 9.9  NEUTROABS 4.9  --   --   HGB 10.7* 11.1* 10.2*  HCT 29.3* 29.9* 27.7*  MCV 104.6* 104.5* 104.9*  PLT 219 218 196   Basic Metabolic Panel:  Recent Labs Lab 03/27/16 2120 03/28/16 0140 03/28/16 0511  NA 115* 119* 121*  K 2.5* 2.5* 2.6*  CL 83* 89* 91*  CO2 20* 20* 20*  GLUCOSE 117* 127* 114*  BUN 13 12 11   CREATININE 1.10 0.93 0.90  CALCIUM 7.8* 7.5* 7.5*  MG 1.1*  --   --    GFR: Estimated Creatinine Clearance: 69.3 mL/min (by C-G formula based on SCr of 0.9 mg/dL). Liver Function Tests:  Recent Labs Lab 03/27/16 2120 03/28/16 0511  AST 72* 54*  ALT 25 21  ALKPHOS 115 92  BILITOT 3.1* 2.8*  PROT 8.3* 6.8  ALBUMIN 3.1* 2.5*   No results for  input(s): LIPASE, AMYLASE in the last 168 hours.  Recent Labs Lab 03/27/16 2120  AMMONIA 40*   Coagulation Profile:  Recent Labs Lab 03/27/16 2120  INR 1.20   Cardiac Enzymes:  Recent Labs Lab 03/27/16 2120  TROPONINI <0.03   BNP (last 3 results) No results for input(s): PROBNP in the last 8760 hours. HbA1C: No results for input(s): HGBA1C in the last 72 hours. CBG: No results for input(s): GLUCAP in the last 168 hours. Lipid Profile: No results for input(s): CHOL, HDL, LDLCALC, TRIG, CHOLHDL, LDLDIRECT in the last 72 hours. Thyroid Function Tests:  Recent Labs  03/27/16 2120  TSH 1.369   Anemia Panel:  Recent Labs  03/28/16 0140  RETICCTPCT 3.6*   Urine analysis:    Component Value Date/Time   COLORURINE YELLOW 03/27/2016 2255   APPEARANCEUR CLEAR 03/27/2016 2255   LABSPEC <1.005 (L) 03/27/2016 2255   PHURINE 6.0 03/27/2016 2255   GLUCOSEU NEGATIVE 03/27/2016 2255   HGBUR MODERATE (A) 03/27/2016 2255   HGBUR large 05/31/2010 1502   BILIRUBINUR NEGATIVE 03/27/2016 2255   KETONESUR NEGATIVE 03/27/2016 2255   PROTEINUR NEGATIVE 03/27/2016 2255   UROBILINOGEN 1.0 12/26/2014 2150   NITRITE NEGATIVE 03/27/2016 2255   LEUKOCYTESUR LARGE (A) 03/27/2016 2255   Sepsis Labs: @LABRCNTIP (procalcitonin:4,lacticidven:4)  )No results found for this or any previous visit (from the past 240 hour(s)).       Radiology Studies: Dg Chest 2 View  Result Date: 03/27/2016 CLINICAL DATA:  65 year old male with fall and generalized weakness EXAM: CHEST  2 VIEW COMPARISON:  Chest radiograph dated 12/26/2014 FINDINGS: The heart size and mediastinal contours are within normal limits. Both lungs are clear. The visualized skeletal structures are unremarkable. IMPRESSION: No active cardiopulmonary disease. Electronically Signed   By: Elgie Collard M.D.   On: 03/27/2016 21:24   Ct Head Wo Contrast  Result Date: 03/27/2016 CLINICAL DATA:  Larey Seat today with laceration to the  back of the head. EXAM: CT HEAD WITHOUT CONTRAST CT CERVICAL SPINE WITHOUT CONTRAST TECHNIQUE: Multidetector CT imaging of the head and cervical spine was performed following the standard protocol without intravenous contrast. Multiplanar CT image reconstructions of the cervical spine were also generated. COMPARISON:  04/23/2014 FINDINGS: CT HEAD FINDINGS The brain shows generalized atrophy. There are chronic small-vessel ischemic changes affecting the basal ganglia and hemispheric white matter. No sign of acute infarction, mass lesion, hemorrhage, hydrocephalus or extra-axial collection. No skull fracture. No fluid in the sinuses. There is atherosclerotic calcification of the major vessels at the base of the brain. CT CERVICAL SPINE FINDINGS Alignment is normal. No fracture. No soft tissue swelling. Facet osteoarthritis on the right at C3-4 and C4-5. Minimal mid cervical spondylosis. There is advanced calcification in the carotid bifurcation  regions. IMPRESSION: Head CT: No acute or traumatic finding. Atrophy and chronic small vessel disease. Cervical spine CT: No acute or traumatic finding. Right-sided facet arthropathy C3-4 and C4-5. Advanced atherosclerotic calcification in the carotid bifurcation regions. Electronically Signed   By: Paulina Fusi M.D.   On: 03/27/2016 21:21   Ct Cervical Spine Wo Contrast  Result Date: 03/27/2016 CLINICAL DATA:  Larey Seat today with laceration to the back of the head. EXAM: CT HEAD WITHOUT CONTRAST CT CERVICAL SPINE WITHOUT CONTRAST TECHNIQUE: Multidetector CT imaging of the head and cervical spine was performed following the standard protocol without intravenous contrast. Multiplanar CT image reconstructions of the cervical spine were also generated. COMPARISON:  04/23/2014 FINDINGS: CT HEAD FINDINGS The brain shows generalized atrophy. There are chronic small-vessel ischemic changes affecting the basal ganglia and hemispheric white matter. No sign of acute infarction, mass  lesion, hemorrhage, hydrocephalus or extra-axial collection. No skull fracture. No fluid in the sinuses. There is atherosclerotic calcification of the major vessels at the base of the brain. CT CERVICAL SPINE FINDINGS Alignment is normal. No fracture. No soft tissue swelling. Facet osteoarthritis on the right at C3-4 and C4-5. Minimal mid cervical spondylosis. There is advanced calcification in the carotid bifurcation regions. IMPRESSION: Head CT: No acute or traumatic finding. Atrophy and chronic small vessel disease. Cervical spine CT: No acute or traumatic finding. Right-sided facet arthropathy C3-4 and C4-5. Advanced atherosclerotic calcification in the carotid bifurcation regions. Electronically Signed   By: Paulina Fusi M.D.   On: 03/27/2016 21:21        Scheduled Meds: . sodium chloride   Intravenous STAT  . enoxaparin (LOVENOX) injection  40 mg Subcutaneous Q24H  . fluticasone  1 spray Each Nare Daily  . folic acid  1 mg Oral Daily  . potassium chloride  40 mEq Oral Q4H  . thiamine  100 mg Oral Daily   Continuous Infusions: . sodium chloride 100 mL/hr (03/28/16 0441)     LOS: 0 days    Time spent: 25 Minutes   Erick Blinks, MD [facility]  If 7PM-7AM, please contact night-coverage [email or web address] Password [facility] 03/28/2016, 6:34 AM

## 2016-03-28 NOTE — Progress Notes (Signed)
Dr Sharl Ma paged for orthostatics

## 2016-03-28 NOTE — Progress Notes (Signed)
Ativan and tyl given per pt request to help sleep and ha of 5 of 10 around staples

## 2016-03-28 NOTE — Progress Notes (Signed)
ANTIBIOTIC CONSULT NOTE-Preliminary  Pharmacy Consult for ciprofloxacin Indication: UTI  Allergies  Allergen Reactions  . Amlodipine Besylate     REACTION: Feet swell  . Penicillins Other (See Comments)    Childhood allergy    Patient Measurements: Height: 5\' 5"  (165.1 cm) Weight: 151 lb 10.8 oz (68.8 kg) IBW/kg (Calculated) : 61.5 Adjusted Body Weight:   Vital Signs: Temp: 96.5 F (35.8 C) (08/01 0025) Temp Source: Axillary (08/01 0025) BP: 128/89 (08/01 0025) Pulse Rate: 81 (08/01 0025)  Labs:  Recent Labs  03/27/16 2120  WBC 10.2  HGB 10.7*  PLT 219  CREATININE 1.10    Estimated Creatinine Clearance: 56.7 mL/min (by C-G formula based on SCr of 1.1 mg/dL).  No results for input(s): VANCOTROUGH, VANCOPEAK, VANCORANDOM, GENTTROUGH, GENTPEAK, GENTRANDOM, TOBRATROUGH, TOBRAPEAK, TOBRARND, AMIKACINPEAK, AMIKACINTROU, AMIKACIN in the last 72 hours.   Microbiology: No results found for this or any previous visit (from the past 720 hour(s)).  Medical History: Past Medical History:  Diagnosis Date  . Alcoholism (HCC)   . Allergic rhinitis   . Bronchitis   . CAD (coronary artery disease), native coronary artery    Multivessel at cardiac catheterization 1999 - managed medically by Dr. Amil Amen  . Essential hypertension   . Glucose intolerance (pre-diabetes)   . History of SIADH   . History of UTI   . Leukocytosis 06/28/2015  . Prostatic hypertrophy   . Rib fractures   . Ulcer of the stomach and intestine     Medications:  Scheduled:  . sodium chloride   Intravenous STAT  . ciprofloxacin  400 mg Intravenous Once  . enoxaparin (LOVENOX) injection  40 mg Subcutaneous Q24H  . fluticasone  1 spray Each Nare Daily  . folic acid  1 mg Oral Daily  . thiamine  100 mg Oral Daily   Infusions:  . sodium chloride 1,000 mL (03/28/16 0050)   PRN: acetaminophen, LORazepam, ondansetron **OR** ondansetron (ZOFRAN) IV Anti-infectives    Start     Dose/Rate Route  Frequency Ordered Stop   03/28/16 0130  ciprofloxacin (CIPRO) IVPB 400 mg     400 mg 200 mL/hr over 60 Minutes Intravenous  Once 03/28/16 0118        Assessment: 65 yo with hx alcohol abuse and chronic hyponatremia. Pt fell at home, cutting his head.  Starting cipro for UA showing WBC to numerous to count.   Goal of Therapy:  eradication infeciton  Plan:  Preliminary review of pertinent patient information completed.  Protocol will be initiated with a one-time dose(s) of ciprofloxacin 400mg  IV x 1.  Jeani Hawking clinical pharmacist will complete review during morning rounds to assess patient and finalize treatment regimen.  Enya Bureau Scarlett, RPH 03/28/2016,1:19 AM

## 2016-03-29 DIAGNOSIS — E871 Hypo-osmolality and hyponatremia: Principal | ICD-10-CM

## 2016-03-29 LAB — BASIC METABOLIC PANEL
Anion gap: 7 (ref 5–15)
BUN: 6 mg/dL (ref 6–20)
CALCIUM: 7.9 mg/dL — AB (ref 8.9–10.3)
CHLORIDE: 102 mmol/L (ref 101–111)
CO2: 19 mmol/L — ABNORMAL LOW (ref 22–32)
CREATININE: 0.67 mg/dL (ref 0.61–1.24)
GFR calc Af Amer: >60 mL/min (ref >60)
Glucose, Bld: 135 mg/dL — ABNORMAL HIGH (ref 65–99)
Potassium: 5.1 mmol/L (ref 3.5–5.1)
SODIUM: 128 mmol/L — AB (ref 135–145)

## 2016-03-29 LAB — CBC
HCT: 27.3 % — ABNORMAL LOW (ref 39.0–52.0)
Hemoglobin: 9.7 g/dL — ABNORMAL LOW (ref 13.0–17.0)
MCH: 38.5 pg — ABNORMAL HIGH (ref 26.0–34.0)
MCHC: 35.5 g/dL (ref 30.0–36.0)
MCV: 108.3 fL — AB (ref 78.0–100.0)
PLATELETS: 188 10*3/uL (ref 150–400)
RBC: 2.52 MIL/uL — ABNORMAL LOW (ref 4.22–5.81)
RDW: 15.6 % — AB (ref 11.5–15.5)
WBC: 9.5 10*3/uL (ref 4.0–10.5)

## 2016-03-29 MED ORDER — VITAMIN B-1 100 MG PO TABS
100.0000 mg | ORAL_TABLET | Freq: Every day | ORAL | Status: DC
  Administered 2016-03-29 – 2016-03-30 (×2): 100 mg via ORAL
  Filled 2016-03-29: qty 1

## 2016-03-29 MED ORDER — LORAZEPAM 1 MG PO TABS
1.0000 mg | ORAL_TABLET | Freq: Four times a day (QID) | ORAL | Status: DC | PRN
  Administered 2016-03-29: 1 mg via ORAL
  Filled 2016-03-29 (×2): qty 1

## 2016-03-29 MED ORDER — ADULT MULTIVITAMIN W/MINERALS CH
1.0000 | ORAL_TABLET | Freq: Every day | ORAL | Status: DC
  Administered 2016-03-29 – 2016-03-30 (×2): 1 via ORAL
  Filled 2016-03-29 (×2): qty 1

## 2016-03-29 MED ORDER — FOLIC ACID 1 MG PO TABS
1.0000 mg | ORAL_TABLET | Freq: Every day | ORAL | Status: DC
  Administered 2016-03-29 – 2016-03-30 (×2): 1 mg via ORAL
  Filled 2016-03-29: qty 1

## 2016-03-29 MED ORDER — THIAMINE HCL 100 MG/ML IJ SOLN: 100.0000 mg | Freq: Every day | INTRAMUSCULAR | Status: DC

## 2016-03-29 MED ORDER — LORAZEPAM 2 MG/ML IJ SOLN: 1.0000 mg | Freq: Four times a day (QID) | INTRAMUSCULAR | Status: DC | PRN

## 2016-03-29 NOTE — Progress Notes (Signed)
Gave report to [facility] on 300 who will be getting patient in room 303

## 2016-03-29 NOTE — Evaluation (Signed)
Physical Therapy Evaluation Patient Details Name: Billy Stone MRN: [id number] DOB: [birth year] Today's Date: 03/29/2016   History of Present Illness  65 yo M admitted after he fell backwards at home.  Head CT (-).   Dx: Hypovolemic hyponatremia.  PMH: ETOH abuse and has been drinking alcohol almost everyday for the past week, cirrhosis, chronic hyponatremia.  Clinical Impression  Pt received in bed, and was agreeable to PT evaluation.  Brother present, but stepped out for mobility assessment.  Pt is normally modified independent with cane, and is independent with ADL's.  Today, he required Min A for ambulation x 269ft with RW.  Unsteady with turns.  Pt would benefit from HHPT.     Follow Up Recommendations Home health PT    Equipment Recommendations  None recommended by PT    Recommendations for Other Services       Precautions / Restrictions Precautions Precautions: Fall Precaution Comments: "1/2" fall down to knees. Restrictions Weight Bearing Restrictions: No      Mobility  Bed Mobility Overal bed mobility: Modified Independent                Transfers   Equipment used: Rolling walker (2 wheeled)                Ambulation/Gait Ambulation/Gait assistance: Min assist Ambulation Distance (Feet): 200 Feet Assistive device: Rolling walker (2 wheeled) Gait Pattern/deviations: Step-through pattern;Antalgic;Trendelenburg     General Gait Details: trendelenburg on the right, Min A due to increased unsteadiness with turns.   Stairs            Wheelchair Mobility    Modified Rankin (Stroke Patients Only)       Balance Overall balance assessment: Needs assistance         Standing balance support: Bilateral upper extremity supported Standing balance-Leahy Scale: Fair                               Pertinent Vitals/Pain Pain Assessment: No/denies pain    Home Living   Living Arrangements:  Chanetta Marshall - brother) Available Help at  Discharge: Available PRN/intermittently Type of Home: House Home Access: Stairs to enter   [employer] of Steps: 2 steps inside the house Home Layout: One level Home Equipment: Cane - quad;Cane - single point;Walker - 2 wheels      Prior Function     Gait / Transfers Assistance Needed: Pt states that he uses the cane to ambulate out in the community, but not in the house  ADL's / Homemaking Assistance Needed: Pt was independent with ADL's.          Hand Dominance   Dominant Hand: Right    Extremity/Trunk Assessment   Upper Extremity Assessment: Overall WFL for tasks assessed           Lower Extremity Assessment: Generalized weakness         Communication   Communication: No difficulties  Cognition Arousal/Alertness: Awake/alert Behavior During Therapy: WFL for tasks assessed/performed Overall Cognitive Status: Within Functional Limits for tasks assessed                      General Comments      Exercises        Assessment/Plan    PT Assessment Patient needs continued PT services  PT Diagnosis Difficulty walking;Abnormality of gait;Generalized weakness   PT Problem List Decreased strength;Decreased activity tolerance;Decreased balance;Decreased mobility;Decreased knowledge of  use of DME;Decreased cognition;Decreased safety awareness;Decreased knowledge of precautions  PT Treatment Interventions DME instruction;Gait training;Functional mobility training;Therapeutic activities;Therapeutic exercise;Balance training;Stair training;Patient/family education   PT Goals (Current goals can be found in the Care Plan section) Acute Rehab PT Goals Patient Stated Goal: Pt wants to get stronger PT Goal Formulation: With patient Time For Goal Achievement: 04/05/16    Frequency Min 3X/week   Barriers to discharge Decreased caregiver support      Co-evaluation               End of Session Equipment Utilized During Treatment: Gait  belt Activity Tolerance: Patient tolerated treatment well Patient left: in chair;with call bell/phone within reach Nurse Communication: Mobility status    Functional Assessment Tool Used: [employer] AM [facility] 6-clicks Functional Limitation: Mobility: Walking and moving around Mobility: Walking and Moving Around Current Status [phone]): At least 40 percent but less than 60 percent impaired, limited or restricted Mobility: Walking and Moving Around Goal Status [phone]): At least 20 percent but less than 40 percent impaired, limited or restricted    Time: 0981-1914 PT Time Calculation (min) (ACUTE ONLY): 23 min   Charges:   PT Evaluation $PT Eval Low Complexity: 1 Procedure PT Treatments $Gait Training: 8-22 mins   PT G Codes:   PT G-Codes **NOT FOR INPATIENT CLASS** Functional Assessment Tool Used: [employer] AM [facility] 6-clicks Functional Limitation: Mobility: Walking and moving around Mobility: Walking and Moving Around Current Status [phone]): At least 40 percent but less than 60 percent impaired, limited or restricted Mobility: Walking and Moving Around Goal Status [phone]): At least 20 percent but less than 40 percent impaired, limited or restricted    Beth Governor Matos, PT, DPT X: [phone]

## 2016-03-29 NOTE — Progress Notes (Signed)
Patient pulled out IV access this AM and Dr. Irene Limbo stated it will be ok to leave access out.

## 2016-03-29 NOTE — Progress Notes (Signed)
PROGRESS NOTE  Billy Stone WUJ:[id number] DOB: 1950/09/09 DOA: 03/27/2016 PCP: [name], MD  Brief Narrative: 65 year old man with a hx of alcoholic liver cirrhosis presented to the ED s/p non-mechanical fall. While in the ED, he was noted to be hypotensive. He was admitted for further evaluation.  Assessment/Plan: 1. Hypovolemic hyponatremia. Secondary to poor oral intake and alcohol use complicated by cirrhosis with chronic hyponatremia. Continues to improve with IV fluids. Asymptomatic, no nausea or vomiting. Mental status appears to be at baseline.  2. Non-mechanical fall secondary to orthostatic hypotension. Patient takes Lasix at home for chronic edema related to cirrhosis. CT head and spine unremarkable. 3. Hypokalemia. Resolved. 4. Hypomagnesemia. Replete it. 5. Dehydration. Resolving. 6. Macrocytic anemia, likely related to alcohol abuse. Stable, no evidence of bleeding at this time. Anemia panel shows elevated ferritin at 1138. Vitamin B12 elevated at 5645. Folate low.   7. Pyuria. No retention, dysuria. Asymmetric. Discontinue antibiotics. 8. Cirrhosis of the liver secondary to alcohol abuse. 9. Alcohol abuse. Placed on CIWA protocols. No signs of withdrawal at this time. 10. Hypotension. Likely related to volume depletion. BB and diuretics currently being held.    Overall improving. Sodium is correcting back to baseline with IV fluids. No evidence of volume overload on exam.  Will have physical therapy evaluate. Continue current management. Will transfer to medical floor, anticipate discharge home tomorrow.  Follow up urine culture. Discontinue antibiotics.  DVT prophylaxis: lovenox Code Status: Full Family Communication: brother present at bedside Disposition Plan: Discharge once improved, likely tomorrow.  Brendia Sacks, MD  [facility] Direct contact: [phone] --Via amion app OR  --[email or web address]; password [email or web address]  7PM-7AM contact night coverage as  above 03/29/2016, 11:22 AM  LOS: 1 day   Consultants:   None  Procedures:   None  Antimicrobials:   cipro 7/31>>  HPI/Subjective: Feels groggy. He hadn't slept well, though reports adequate rest last night. Moderate appetite. Denies any nausea or vomiting. Denies weakness or numbness. Breathing well.   Objective: Vitals:   03/29/16 0500 03/29/16 0600 03/29/16 0735 03/29/16 1120  BP:  (!) 96/54    Pulse:  71    Resp:  15    Temp:   97.6 F (36.4 C) 97.5 F (36.4 C)  TempSrc:   Oral Oral  SpO2:  100%    Weight: 73 kg (160 lb 15 oz)     Height:        Intake/Output Summary (Last 24 hours) at 03/29/16 1122 Last data filed at 03/29/16 0400  Gross per 24 hour  Intake              480 ml  Output             1700 ml  Net            -1220 ml     Filed Weights   03/27/16 1926 03/28/16 0025 03/29/16 0500  Weight: 65.8 kg (145 lb) 68.8 kg (151 lb 10.8 oz) 73 kg (160 lb 15 oz)    Exam:    Constitutional:  . Appears calm and comfortable Eyes:  . PERRL and irises appear normal . Conjunctivae and lids appear normal ENMT:  . external ears, nose appear normal . grossly normal hearing  . Lips appear normal Neck:  . neck appears normal, no masses, normal ROM, supple Respiratory:  . CTA bilaterally, no w/r/r.  . Respiratory effort normal. No retractions or accessory muscle use Cardiovascular:  . RRR, no m/r/g . No  LE extremity edema   . Telemetry SR Abdomen:  . Abdomen appears normal; no tenderness or masses, nondistended Musculoskeletal:   Moves all extremities. Skin:  . Laceration occiput, well approximated with 7 staples Psychiatric:  . judgement and insight appear normal . Mental status o Mood, affect appropriate o Orientation to person, place, time   I have personally reviewed following labs and imaging studies:  Vit B12 5645, Folate 1138  Sodium 128, improving  Potassium 5.1, improved  BUN and Cr wnl.  Blood sugars stable  WBC 9.5  Hgb  9.7, htc 27.3. Stable.  Platelets 188  Scheduled Meds: . enoxaparin (LOVENOX) injection  40 mg Subcutaneous Q24H  . fluticasone  1 spray Each Nare Daily  . folic acid  1 mg Oral Daily  . multivitamin with minerals  1 tablet Oral Daily  . thiamine  100 mg Oral Daily   Or  . thiamine  100 mg Intravenous Daily   Continuous Infusions: . 0.9 % NaCl with KCl 40 mEq / L 100 mL/hr (03/29/16 0306)    Principal Problem:   Hyponatremia Active Problems:   Alcohol abuse   Cirrhosis of liver (HCC)   Hypokalemia   Fall   LOS: 1 day   Time spent 25 minutes  By signing my name below, I, Adron Bene, attest that this documentation has been prepared under the direction and in the presence of Dorn Hartshorne P. Irene Limbo, MD. Electronically Signed: Adron Bene, Scribe.  03/29/16 10:15am    I personally performed the services described in this documentation. All medical record entries made by the scribe were at my direction. I have reviewed the chart and agree that the record reflects my personal performance and is accurate and complete. Brendia Sacks, MD

## 2016-03-30 LAB — URINE CULTURE

## 2016-03-30 MED ORDER — ADULT MULTIVITAMIN W/MINERALS CH: 1.0000 | ORAL_TABLET | Freq: Every day | ORAL | 0 refills | Status: AC

## 2016-03-30 MED ORDER — FOLIC ACID 1 MG PO TABS: 1.0000 mg | ORAL_TABLET | Freq: Every day | ORAL | 0 refills | Status: DC

## 2016-03-30 MED ORDER — THIAMINE HCL 100 MG PO TABS: 100.0000 mg | ORAL_TABLET | Freq: Every day | ORAL | 0 refills | Status: DC

## 2016-03-30 NOTE — Progress Notes (Signed)
PROGRESS NOTE  Billy Stone ZOX:[id number] DOB: 1951/08/28 DOA: 03/27/2016 PCP: [name], MD  Brief Narrative: 65 year old man with a hx of alcoholic liver cirrhosis presented to the ED s/p non-mechanical fall. While in the ED, he was noted to be hypotensive and severely hyponatremic. He was admitted for further evaluation. His sodium has rapidly improved with IV fluids. He appears to be clinically stable and can be discharged home later today. Patient has declined outpatient PT.  Assessment/Plan: 1. Hypovolemic hyponatremia. At baseline now. Secondary to poor oral intake, alcohol use, complicated by cirrhosis and chronic hyponatremia. Remains asymptomatic. 2. Non-mechanical fall secondary to orthostatic hypotension. CT head and spine unremarkable. 3. Hypokalemia. Resolved. 4. Dehydration. Resolved with IV fluids. 5. Macrocytic anemia, likely related to alcohol abuse. Stable, no evidence of bleeding. Anemia panel shows elevated ferritin at 1138. Vitamin B12 elevated at 5645. Folate low.   6. Pyuria. No retention, dysuria. Asymptomatic bacteriuria. No treatment indicated. 7. Cirrhosis of the liver secondary to alcohol abuse. 8. Alcohol abuse. Placed on CIWA protocols. No evidence of withdrawal. 9. Hypotension. Likely related to volume depletion. Resolved.   Overall improved. Doing well, hyponatremia at baseline. No urinary symptoms. Home today.  Patient declines PT.  Discussed with brother at bedside    Brendia Sacks, MD  [facility] Direct contact: [phone] --Via amion app OR  --[email or web address]; password [email or web address]  7PM-7AM contact night coverage as above 03/30/2016, 7:18 AM  LOS: 2 days   Consultants:   None  Procedures:   None  Antimicrobials:   Cipro 7/31>> 8/02  HPI/Subjective: Feels well today. Slept well. Eating well. Denies nausea or vomiting. No new complaints.   Objective: Vitals:   03/29/16 1600 03/29/16 2000 03/29/16 2302 03/30/16 0200  BP:  100/61 132/62  (!) 120/59  Pulse:  88  (!) 107  Resp: Temp:  (!) 96.8 F (36 C)  97.6 F (36.4 C)  TempSrc:  Oral  Oral  SpO2:  100% 99% 100%  Weight:      Height:        Intake/Output Summary (Last 24 hours) at 03/30/16 0718 Last data filed at 03/30/16 0553  Gross per 24 hour  Intake              480 ml  Output             1700 ml  Net            -1220 ml     Filed Weights   03/27/16 1926 03/28/16 0025 03/29/16 0500  Weight: 65.8 kg (145 lb) 68.8 kg (151 lb 10.8 oz) 73 kg (160 lb 15 oz)    Exam:    Constitutional:  . Appears calm and comfortable Respiratory:  . CTA bilaterally, no w/r/r.  . Respiratory effort normal. No retractions or accessory muscle use Cardiovascular:  . RRR, no m/r/g . No LE extremity edema   Psychiatric:  . judgement and insight appear normal . Mental status o Mood, affect appropriate   I have personally reviewed following labs and imaging studies:  No new labs today  Scheduled Meds: . enoxaparin (LOVENOX) injection  40 mg Subcutaneous Q24H  . fluticasone  1 spray Each Nare Daily  . folic acid  1 mg Oral Daily  . multivitamin with minerals  1 tablet Oral Daily  . thiamine  100 mg Oral Daily   Or  . thiamine  100 mg Intravenous Daily   Continuous Infusions: . 0.9 %  NaCl with KCl 40 mEq / L 100 mL/hr (03/29/16 0306)    Principal Problem:   Hyponatremia Active Problems:   Alcohol abuse   Cirrhosis of liver (HCC)   Hypokalemia   Fall   LOS: 2 days    By signing my name below, I, Adron Bene, attest that this documentation has been prepared under the direction and in the presence of Daniel P. Irene Limbo, MD. Electronically Signed: Adron Bene, Scribe.  03/30/16 9:30am  I personally performed the services described in this documentation. All medical record entries made by the scribe were at my direction. I have reviewed the chart and agree that the record reflects my personal performance and is accurate and  complete. Brendia Sacks, MD

## 2016-03-30 NOTE — Care Management Important Message (Signed)
Important Message  Patient Details  Name: Billy Stone MRN: [id number] Date of Birth: October 23, 1950   Medicare Important Message Given:  Yes    Malcolm Metro, RN 03/30/2016, 9:04 AM

## 2016-03-30 NOTE — Progress Notes (Signed)
AVS reviewed with patient.  Patient verbalized understanding of discharge instructions, physician follow-up, medications.  Prescription given to patient.  Patient transported by NT via wheelchair to main entrance for discharge.  Patient stable at time of discharge.

## 2016-03-30 NOTE — Care Management Note (Signed)
Case Management Note  Patient Details  Name: Billy Stone MRN: [id number] Date of Birth: 03/13/1951  Subjective/Objective:                  Pt admitted with hyponatremia. Pt lives at home with his brother. Pt's brother is at the bedside. Pt is ind with ADL's. Pt has cane at bedside and walker at home to use if needed. Pt plans to return home today. PT has recommended HH PT, pt has had HH services in the past and is no interested in them at this time. Explained to pt he can contact his PCP after DC if he changes his mind.   Action/Plan: DC home today. No CM needs.   Expected Discharge Date:     03/30/2016             Expected Discharge Plan:  Home/Self Care  In-House Referral:  NA  Discharge planning Services  CM Consult  Post Acute Care Choice:  NA Choice offered to:  NA  DME Arranged:    DME Agency:     HH Arranged:    HH Agency:     Status of Service:  Completed, signed off  If discussed at [employer] of Stay Meetings, dates discussed:    Additional Comments:  Malcolm Metro, RN 03/30/2016, 9:04 AM

## 2016-03-30 NOTE — Discharge Summary (Signed)
Physician Discharge Summary  Billy Stone ZWC:[id number] DOB: 17-Dec-1950 DOA: 03/27/2016  PCP: Billy Melena, MD  Admit date: 03/27/2016 Discharge date: 03/30/2016  Recommendations for Outpatient Follow-up:  1. Follow-up chronic hyponatremia 2. Encouraged alcohol cessation 3. Follow-up microcytic anemia  Follow-up Information    Billy Melena, MD On 04/13/2016.   Specialty:  Internal Medicine Why:  at 2:30 pm Contact information: [address] [place] [address] [phone]          Discharge Diagnoses:  1. Hypovolemic hyponatremia 2. Non-mechanical fall 3. Hypokalemia 4. Dehydration 5. Macrocytic anemia 6. Cirrhosis of liver 7. Alcohol abuse  Discharge Condition: Stable Disposition: Discharge home  Diet recommendation: heart healthy, low sodium  Filed Weights   03/27/16 1926 03/28/16 0025 03/29/16 0500  Weight: 65.8 kg (145 lb) 68.8 kg (151 lb 10.8 oz) 73 kg (160 lb 15 oz)    History of present illness:  65 year old man with a hx of alcoholic liver cirrhosis presented to the ED s/p non-mechanical fall. Workup revealed hyponatremia.  Hospital Course:  While in the ED, he was noted to be hypotensive and severely hyponatremic. He was admitted for further evaluation. His sodium has rapidly improved with IV fluids to baseline status consistent with presumed hypovolemic hyponatremia. He has chronic hyponatremia presumably secondary to cirrhosis. Hospitalization was uncomplicated. Patient refused home health physical therapy.  1. Hypovolemic hyponatremia. Returned to baseline. Secondary to poor oral intake and alcohol use complicated by cirrhosis with chronic hyponatremia. Asymptomatic, no nausea or vomiting. Mental status appears to be at baseline.  2. Non-mechanical fall secondary to orthostatic hypotension. Patient takes Lasix at home for chronic edema related to cirrhosis. CT head and spine unremarkable. 3. Hypokalemia. Resolved. 4. Hypomagnesemia.  Repleted 5. Dehydration. Resolved. 6. Macrocytic anemia, likely related to alcohol abuse. Stable, no evidence of bleeding at this time. Anemia panel shows elevated ferritin at 1138. Vitamin B12 elevated at 5645. Folate low.   7. Pyuria. No retention, dysuria. Asymptomatic. Antibiotics have been discontinued. 8. Cirrhosis of the liver secondary to alcohol abuse. 9. Alcohol abuse. Placed on CIWA protocols. No signs of withdrawal at this time. 10. Hypotension. Likely related to volume depletion. BB and diuretics currently being held.   Consultants:  None  Procedures:  None  Antimicrobials:   Cipro 7/31>> 8/02  Discharge Instructions  Discharge Instructions    Diet general    Complete by:  As directed   Discharge instructions    Complete by:  As directed   Call your physician or seek immediate medical attention for falls, dizziness, confusion, nausea, vomiting or worsening of condition.   Increase activity slowly    Complete by:  As directed       Medication List    TAKE these medications   acetaminophen 500 MG tablet Commonly known as:  TYLENOL Take 500-1,000 mg by mouth every 6 (six) hours as needed for mild pain.   BESIVANCE 0.6 % Susp Generic drug:  Besifloxacin HCl Apply 1 drop to eye 3 (three) times daily.   chlorpheniramine 4 MG tablet Commonly known as:  CHLOR-TRIMETON Take 4 mg by mouth 2 (two) times daily.   diphenhydrAMINE 25 mg capsule Commonly known as:  BENADRYL Take 25 mg by mouth daily as needed for allergies.   DUREZOL 0.05 % Emul Generic drug:  Difluprednate Apply 1 drop to eye 3 (three) times daily.   fluticasone 50 MCG/ACT nasal spray Commonly known as:  FLONASE Place 1 spray into both nostrils daily.   folic  acid 1 MG tablet Commonly known as:  FOLVITE Take 1 tablet (1 mg total) by mouth daily.   furosemide 20 MG tablet Commonly known as:  LASIX TAKE ONE TABLET BY MOUTH ONCE DAILY   metoprolol 50 MG tablet Commonly known as:   LOPRESSOR TAKE ONE TABLET BY MOUTH TWICE DAILY   multivitamin with minerals Tabs tablet Take 1 tablet by mouth daily.   nitroGLYCERIN 0.4 MG SL tablet Commonly known as:  NITROSTAT Place 1 tablet (0.4 mg total) under the tongue every 5 (five) minutes as needed for chest pain.   omeprazole 20 MG capsule Commonly known as:  PRILOSEC TAKE ONE CAPSULE BY MOUTH EVERY MORNING AND AT SUPPER FOR 3 MONTHS, THEN ONCE DAILY FOREVER What changed:  See the new instructions.   PROLENSA 0.07 % Soln Generic drug:  Bromfenac Sodium Apply 1 drop to eye daily.   spironolactone 50 MG tablet Commonly known as:  ALDACTONE TAKE ONE TABLET BY MOUTH ONCE DAILY   SUPER B COMPLEX PO Take 1 tablet by mouth daily.   thiamine 100 MG tablet Take 1 tablet (100 mg total) by mouth daily.      Allergies  Allergen Reactions  . Amlodipine Besylate     REACTION: Feet swell  . Penicillins Other (See Comments)    Childhood allergy    The results of significant diagnostics from this hospitalization (including imaging, microbiology, ancillary and laboratory) are listed below for reference.    Significant Diagnostic Studies: Dg Chest 2 View  Result Date: 03/27/2016 CLINICAL DATA:  65 year old male with fall and generalized weakness EXAM: CHEST  2 VIEW COMPARISON:  Chest radiograph dated 12/26/2014 FINDINGS: The heart size and mediastinal contours are within normal limits. Both lungs are clear. The visualized skeletal structures are unremarkable. IMPRESSION: No active cardiopulmonary disease. Electronically Signed   By: Elgie Collard M.D.   On: 03/27/2016 21:24   Ct Head Wo Contrast  Result Date: 03/27/2016 CLINICAL DATA:  Larey Seat today with laceration to the back of the head. EXAM: CT HEAD WITHOUT CONTRAST CT CERVICAL SPINE WITHOUT CONTRAST TECHNIQUE: Multidetector CT imaging of the head and cervical spine was performed following the standard protocol without intravenous contrast. Multiplanar CT image  reconstructions of the cervical spine were also generated. COMPARISON:  04/23/2014 FINDINGS: CT HEAD FINDINGS The brain shows generalized atrophy. There are chronic small-vessel ischemic changes affecting the basal ganglia and hemispheric white matter. No sign of acute infarction, mass lesion, hemorrhage, hydrocephalus or extra-axial collection. No skull fracture. No fluid in the sinuses. There is atherosclerotic calcification of the major vessels at the base of the brain. CT CERVICAL SPINE FINDINGS Alignment is normal. No fracture. No soft tissue swelling. Facet osteoarthritis on the right at C3-4 and C4-5. Minimal mid cervical spondylosis. There is advanced calcification in the carotid bifurcation regions. IMPRESSION: Head CT: No acute or traumatic finding. Atrophy and chronic small vessel disease. Cervical spine CT: No acute or traumatic finding. Right-sided facet arthropathy C3-4 and C4-5. Advanced atherosclerotic calcification in the carotid bifurcation regions. Electronically Signed   By: Paulina Fusi M.D.   On: 03/27/2016 21:21   Ct Cervical Spine Wo Contrast  Result Date: 03/27/2016 CLINICAL DATA:  Larey Seat today with laceration to the back of the head. EXAM: CT HEAD WITHOUT CONTRAST CT CERVICAL SPINE WITHOUT CONTRAST TECHNIQUE: Multidetector CT imaging of the head and cervical spine was performed following the standard protocol without intravenous contrast. Multiplanar CT image reconstructions of the cervical spine were also generated. COMPARISON:  04/23/2014 FINDINGS:  CT HEAD FINDINGS The brain shows generalized atrophy. There are chronic small-vessel ischemic changes affecting the basal ganglia and hemispheric white matter. No sign of acute infarction, mass lesion, hemorrhage, hydrocephalus or extra-axial collection. No skull fracture. No fluid in the sinuses. There is atherosclerotic calcification of the major vessels at the base of the brain. CT CERVICAL SPINE FINDINGS Alignment is normal. No fracture.  No soft tissue swelling. Facet osteoarthritis on the right at C3-4 and C4-5. Minimal mid cervical spondylosis. There is advanced calcification in the carotid bifurcation regions. IMPRESSION: Head CT: No acute or traumatic finding. Atrophy and chronic small vessel disease. Cervical spine CT: No acute or traumatic finding. Right-sided facet arthropathy C3-4 and C4-5. Advanced atherosclerotic calcification in the carotid bifurcation regions. Electronically Signed   By: Paulina Fusi M.D.   On: 03/27/2016 21:21    Microbiology: Recent Results (from the past 240 hour(s))  MRSA PCR Screening     Status: None   Collection Time: 03/28/16 12:16 AM  Result Value Ref Range Status   MRSA by PCR NEGATIVE NEGATIVE Final    Comment:        The GeneXpert MRSA Assay (FDA approved for NASAL specimens only), is one component of a comprehensive MRSA colonization surveillance program. It is not intended to diagnose MRSA infection nor to guide or monitor treatment for MRSA infections.   Culture, Urine     Status: Abnormal   Collection Time: 03/28/16  1:00 AM  Result Value Ref Range Status   Specimen Description URINE, CATHETERIZED  Final   Special Requests NONE  Final   Culture 80,000 COLONIES/mL KLEBSIELLA SPECIES (A)  Final   Report Status 03/30/2016 FINAL  Final   Organism ID, Bacteria KLEBSIELLA SPECIES (A)  Final      Susceptibility   Klebsiella species - MIC*    AMPICILLIN >=32 RESISTANT Resistant     CEFAZOLIN <=4 SENSITIVE Sensitive     CEFTRIAXONE <=1 SENSITIVE Sensitive     CIPROFLOXACIN <=0.25 SENSITIVE Sensitive     GENTAMICIN <=1 SENSITIVE Sensitive     IMIPENEM <=0.25 SENSITIVE Sensitive     NITROFURANTOIN <=16 SENSITIVE Sensitive     TRIMETH/SULFA <=20 SENSITIVE Sensitive     AMPICILLIN/SULBACTAM 4 SENSITIVE Sensitive     PIP/TAZO 16 SENSITIVE Sensitive     * 80,000 COLONIES/mL KLEBSIELLA SPECIES     Labs: Basic Metabolic Panel:  Recent Labs Lab 03/27/16 2120 03/28/16 0140  03/28/16 0511 03/29/16 0431  NA 115* 119* 121* 128*  K 2.5* 2.5* 2.6* 5.1  CL 83* 89* 91* 102  CO2 20* 20* 20* 19*  GLUCOSE 117* 127* 114* 135*  BUN CREATININE 1.10 0.93 0.90 0.67  CALCIUM 7.8* 7.5* 7.5* 7.9*  MG 1.1*  --  1.6*  --    Liver Function Tests:  Recent Labs Lab 03/27/16 2120 03/28/16 0511  AST 72* 54*  ALT 25 21  ALKPHOS 115 92  BILITOT 3.1* 2.8*  PROT 8.3* 6.8  ALBUMIN 3.1* 2.5*    Recent Labs Lab 03/27/16 2120  AMMONIA 40*   CBC:  Recent Labs Lab 03/27/16 2120 03/28/16 0140 03/28/16 0511 03/29/16 0431  WBC 10.2 10.1 9.9 9.5  NEUTROABS 4.9  --   --   --   HGB 10.7* 11.1* 10.2* 9.7*  HCT 29.3* 29.9* 27.7* 27.3*  MCV 104.6* 104.5* 104.9* 108.3*  PLT 219 218 196 188   Cardiac Enzymes:  Recent Labs Lab 03/27/16 2120  TROPONINI <0.03    Principal  Problem:   Hyponatremia Active Problems:   Alcohol abuse   Cirrhosis of liver (HCC)   Hypokalemia   Fall   Time coordinating discharge: 35 minutes  Signed:  Brendia Sacks, MD [facility] 03/30/2016, 3:13 PM   By signing my name below, I, Adron Bene, attest that this documentation has been prepared under the direction and in the presence of Deronte Solis P. Irene Limbo, MD. Electronically Signed: Adron Bene, Scribe.  03/30/16 9:35am   I personally performed the services described in this documentation. All medical record entries made by the scribe were at my direction. I have reviewed the chart and agree that the record reflects my personal performance and is accurate and complete. Brendia Sacks, MD

## 2016-04-04 ENCOUNTER — Encounter (HOSPITAL_COMMUNITY): Payer: Medicare Other | Attending: Hematology & Oncology

## 2016-04-04 DIAGNOSIS — D7589 Other specified diseases of blood and blood-forming organs: Secondary | ICD-10-CM | POA: Insufficient documentation

## 2016-04-04 DIAGNOSIS — F1010 Alcohol abuse, uncomplicated: Secondary | ICD-10-CM | POA: Diagnosis not present

## 2016-04-04 DIAGNOSIS — D539 Nutritional anemia, unspecified: Secondary | ICD-10-CM | POA: Diagnosis not present

## 2016-04-04 DIAGNOSIS — D72829 Elevated white blood cell count, unspecified: Secondary | ICD-10-CM

## 2016-04-04 LAB — CBC WITH DIFFERENTIAL/PLATELET
BASOS ABS: 0.1 10*3/uL (ref 0.0–0.1)
BASOS PCT: 1 %
EOS PCT: 5 %
Eosinophils Absolute: 0.5 10*3/uL (ref 0.0–0.7)
HCT: 27.1 % — ABNORMAL LOW (ref 39.0–52.0)
Hemoglobin: 9.8 g/dL — ABNORMAL LOW (ref 13.0–17.0)
LYMPHS ABS: 4.9 10*3/uL — AB (ref 0.7–4.0)
Lymphocytes Relative: 48 %
MCH: 39.7 pg — ABNORMAL HIGH (ref 26.0–34.0)
MCHC: 36.2 g/dL — ABNORMAL HIGH (ref 30.0–36.0)
MCV: 109.7 fL — ABNORMAL HIGH (ref 78.0–100.0)
MONO ABS: 1.2 10*3/uL — AB (ref 0.1–1.0)
MONOS PCT: 12 %
NEUTROS ABS: 3.4 10*3/uL (ref 1.7–7.7)
NEUTROS PCT: 34 %
PLATELETS: 247 10*3/uL (ref 150–400)
RBC: 2.47 MIL/uL — ABNORMAL LOW (ref 4.22–5.81)
RDW: 16.1 % — AB (ref 11.5–15.5)
WBC: 10 10*3/uL (ref 4.0–10.5)

## 2016-04-04 LAB — FOLATE: Folate: 66.9 ng/mL (ref >5.9)

## 2016-04-04 LAB — FERRITIN: Ferritin: 720 ng/mL — ABNORMAL HIGH (ref 24–336)

## 2016-04-04 LAB — IRON AND TIBC
IRON: 94 ug/dL (ref 45–182)
Saturation Ratios: 47 % — ABNORMAL HIGH (ref 17.9–39.5)
TIBC: 202 ug/dL — AB (ref 250–450)
UIBC: 108 ug/dL

## 2016-04-04 LAB — VITAMIN B12: Vitamin B-12: 2059 pg/mL — ABNORMAL HIGH (ref 180–914)

## 2016-04-05 ENCOUNTER — Telehealth: Payer: Self-pay | Admitting: Cardiology

## 2016-04-05 DIAGNOSIS — R634 Abnormal weight loss: Secondary | ICD-10-CM

## 2016-04-05 DIAGNOSIS — M7989 Other specified soft tissue disorders: Secondary | ICD-10-CM

## 2016-04-06 NOTE — Telephone Encounter (Signed)
Made apt for tomorrow, 04/07/16 at 130 pm with Corine Shelterluke kilroy PA-C,I have placed request for labs. Unable to reach pt,left message asking pt to call back to confirm

## 2016-04-06 NOTE — Telephone Encounter (Signed)
See discharge note rom July,lasr saw Dr Diona BrownerMcDowell in June,his weight was 163 lbs,today wt is 140 lbs.states he has lost appetite.  His complaint is swollen legs and feet,even when elevated,states his alcholol drinking is intermittent     Please advise

## 2016-04-06 NOTE — Telephone Encounter (Signed)
Can he be added to a PA schedule. His swelling is more complex, from chart review has had issues with dehydration and electrolyte abnormalities on diuretics. If weight down that much I would be worried despite the swelling in his legs the total fluid in his body may be decreased. Please order CMET, Mg, TSH and add to PA schedule this week.  Dominga FerryJ Branch MD

## 2016-04-07 ENCOUNTER — Ambulatory Visit (INDEPENDENT_AMBULATORY_CARE_PROVIDER_SITE_OTHER): Payer: Medicare Other | Admitting: Cardiology

## 2016-04-07 ENCOUNTER — Encounter: Payer: Self-pay | Admitting: Cardiology

## 2016-04-07 VITALS — BP 112/68 | HR 71 | Ht 65.0 in | Wt 169.8 lb

## 2016-04-07 DIAGNOSIS — K7011 Alcoholic hepatitis with ascites: Secondary | ICD-10-CM | POA: Diagnosis not present

## 2016-04-07 DIAGNOSIS — I2510 Atherosclerotic heart disease of native coronary artery without angina pectoris: Secondary | ICD-10-CM

## 2016-04-07 DIAGNOSIS — R609 Edema, unspecified: Secondary | ICD-10-CM | POA: Diagnosis not present

## 2016-04-07 DIAGNOSIS — I509 Heart failure, unspecified: Secondary | ICD-10-CM

## 2016-04-07 DIAGNOSIS — E871 Hypo-osmolality and hyponatremia: Secondary | ICD-10-CM

## 2016-04-07 DIAGNOSIS — E876 Hypokalemia: Secondary | ICD-10-CM

## 2016-04-07 NOTE — Assessment & Plan Note (Signed)
K+ 2.6 when seen in ED recently as well as anemia, hypomagnisemia

## 2016-04-07 NOTE — Patient Instructions (Signed)
Your physician recommends that you schedule a follow-up appointment in: 2 Weeks with Dr. Diona BrownerMcDowell  Your physician has recommended you make the following change in your medication:   Take Lasix 40 mg on Saturday and Sunday   Your physician recommends that you return for lab work on Monday   If you need a refill on your cardiac medications before your next appointment, please call your pharmacy.  Thank you for choosing [facility]!

## 2016-04-07 NOTE — Assessment & Plan Note (Signed)
He says this is a new problem but this has been noted in the past.

## 2016-04-07 NOTE — Assessment & Plan Note (Signed)
Pt on Aldactone

## 2016-04-07 NOTE — Assessment & Plan Note (Signed)
Na+ 118 on recent ED visit

## 2016-04-07 NOTE — Progress Notes (Signed)
04/07/2016 Billy Stone   [birth year]  [id number]  Primary Physician [name], MD Primary Cardiologist: Dr Diona Browner  HPI:  65 y/o male with alcoholic cirrhosis and know CAD treated medically, followed by Dr Margo Aye and Dr Diona Browner. His last echo in 2015 showed preserved LVF. He was recently seen in the ED and admitted after he fell. His K+was 2.5, Na= 115, Mg++ 1.1. He was discharged 8/2 and is seen in the office today for follow up. Besides the addition of thiamine and folic acid his medications are unchanged. He complains of generalized weakness and LE edema. He says his edema is new but this has been noted in past office notes.    Current Outpatient Prescriptions  Medication Sig Dispense Refill  . acetaminophen (TYLENOL) 500 MG tablet Take 500-1,000 mg by mouth every 6 (six) hours as needed for mild pain.    . B Complex-C (SUPER B COMPLEX PO) Take 1 tablet by mouth daily.    Marland Kitchen Besifloxacin HCl (BESIVANCE) 0.6 % SUSP Apply 1 drop to eye 3 (three) times daily.    . Bromfenac Sodium (PROLENSA) 0.07 % SOLN Apply 1 drop to eye daily.    . chlorpheniramine (CHLOR-TRIMETON) 4 MG tablet Take 4 mg by mouth 2 (two) times daily.    . Difluprednate (DUREZOL) 0.05 % EMUL Apply 1 drop to eye 3 (three) times daily.    . diphenhydrAMINE (BENADRYL) 25 mg capsule Take 25 mg by mouth daily as needed for allergies.     . fluticasone (FLONASE) 50 MCG/ACT nasal spray Place 1 spray into both nostrils daily. 16 g 2  . folic acid (FOLVITE) 1 MG tablet Take 1 tablet (1 mg total) by mouth daily. 30 tablet 0  . furosemide (LASIX) 20 MG tablet TAKE ONE TABLET BY MOUTH ONCE DAILY 30 tablet 5  . metoprolol (LOPRESSOR) 50 MG tablet TAKE ONE TABLET BY MOUTH TWICE DAILY 60 tablet 11  . Multiple Vitamin (MULTIVITAMIN WITH MINERALS) TABS tablet Take 1 tablet by mouth daily. 30 tablet 0  . nitroGLYCERIN (NITROSTAT) 0.4 MG SL tablet Place 1 tablet (0.4 mg total) under the tongue every 5 (five) minutes as needed for chest  pain. 25 tablet 3  . omeprazole (PRILOSEC) 20 MG capsule TAKE ONE CAPSULE BY MOUTH EVERY MORNING AND AT SUPPER FOR 3 MONTHS, THEN ONCE DAILY FOREVER (Patient taking differently: TAKE ONE CAPSULE BY MOUTH ONCE DAILY) 60 capsule 5  . spironolactone (ALDACTONE) 50 MG tablet TAKE ONE TABLET BY MOUTH ONCE DAILY 30 tablet 5  . thiamine 100 MG tablet Take 1 tablet (100 mg total) by mouth daily. 30 tablet 0   No current facility-administered medications for this visit.     Allergies  Allergen Reactions  . Amlodipine Besylate     REACTION: Feet swell  . Penicillins Other (See Comments)    Childhood allergy    Social History   Social History  . Marital status: Divorced    Spouse name: N/A  . Number of children: N/A  . Years of education: N/A   Occupational History  . Not on file.   Social History Main Topics  . Smoking status: Former Smoker    Packs/day: 1.00    Years: 21.00    Types: Cigarettes    Start date: 11/18/1966    Quit date: 07/20/1988  . Smokeless tobacco: Never Used  . Alcohol use 0.0 oz/week     Comment: few beers or more,  usually everyday   . Drug use: No  .  Sexual activity: Not on file   Other Topics Concern  . Not on file   Social History Narrative  . No narrative on file     Review of Systems: General: negative for chills, fever, night sweats or weight changes.  Cardiovascular: negative for chest pain, dyspnea on exertion, edema, orthopnea, palpitations, paroxysmal nocturnal dyspnea or shortness of breath Dermatological: negative for rash Respiratory: negative for cough or wheezing Urologic: negative for hematuria Abdominal: negative for nausea, vomiting, diarrhea, bright red blood per rectum, melena, or hematemesis Neurologic: negative for visual changes, syncope, or dizziness All other systems reviewed and are otherwise negative except as noted above.    Blood pressure 112/68, pulse 71, height 5\' 5"  (1.651 m), weight 169 lb 12.8 oz (77 kg), SpO2 97  %.  General appearance: alert, cooperative, no distress and chronically ill appearing Neck: no carotid bruit and no JVD Lungs: clear to auscultation bilaterally Heart: regular rate and rhythm and 2/6 systolic murmur AOV, LSB Extremities: 2-3+ edema to knees Skin: pale, cool, dry Neurologic: Grossly normal   ASSESSMENT AND PLAN:   Edema He says this is a new problem but this has been noted in the past.  Alcoholic hepatitis with ascites Pt on Aldactone  Hyponatremia Na+ 118 on recent ED visit  Hypokalemia K+ 2.6 when seen in ED recently as well as anemia, hypomagnisemia  CAD (coronary artery disease), native coronary artery Documented by remote cath- medical Rx   PLAN  I ordered an echo-? ETOH CM. I suggested he increase his Lasix to 40 mg Sat and Sun. Monday he needs to have his labs repeated. He is scheduled to see Dr Margo AyeHall next week.   Corine ShelterLuke Shelia Kingsberry PA-C 04/07/2016 2:16 PM

## 2016-04-07 NOTE — Assessment & Plan Note (Signed)
Documented by remote cath- medical Rx

## 2016-04-10 ENCOUNTER — Telehealth: Payer: Self-pay | Admitting: *Deleted

## 2016-04-10 ENCOUNTER — Other Ambulatory Visit (HOSPITAL_COMMUNITY)
Admission: RE | Admit: 2016-04-10 | Discharge: 2016-04-10 | Disposition: A | Discharge status: Home / Self Care | Payer: Medicare Other | Source: Ambulatory Visit | Attending: Cardiology | Admitting: Cardiology

## 2016-04-10 DIAGNOSIS — R609 Edema, unspecified: Secondary | ICD-10-CM | POA: Diagnosis not present

## 2016-04-10 DIAGNOSIS — K7011 Alcoholic hepatitis with ascites: Secondary | ICD-10-CM | POA: Insufficient documentation

## 2016-04-10 LAB — BASIC METABOLIC PANEL
Anion gap: 7 (ref 5–15)
BUN: 6 mg/dL (ref 6–20)
CO2: 25 mmol/L (ref 22–32)
Calcium: 8.7 mg/dL — ABNORMAL LOW (ref 8.9–10.3)
Chloride: 92 mmol/L — ABNORMAL LOW (ref 101–111)
Creatinine, Ser: 0.65 mg/dL (ref 0.61–1.24)
GFR calc Af Amer: >60 mL/min (ref >60)
GFR calc non Af Amer: >60 mL/min (ref >60)
Glucose, Bld: 72 mg/dL (ref 65–99)
Potassium: 3.6 mmol/L (ref 3.5–5.1)
Sodium: 124 mmol/L — ABNORMAL LOW (ref 135–145)

## 2016-04-10 LAB — MAGNESIUM: Magnesium: 1.5 mg/dL — ABNORMAL LOW (ref 1.7–2.4)

## 2016-04-10 LAB — BRAIN NATRIURETIC PEPTIDE: B Natriuretic Peptide: 390 pg/mL — ABNORMAL HIGH (ref 0.0–100.0)

## 2016-04-10 MED ORDER — MAGNESIUM OXIDE 400 MG PO TABS: 400.0000 mg | ORAL_TABLET | Freq: Every day | ORAL | 3 refills | Status: DC

## 2016-04-10 NOTE — Telephone Encounter (Signed)
Spoke with pt, New script sent to the pharmacy  

## 2016-04-10 NOTE — Telephone Encounter (Signed)
-----   Message from Abelino DerrickLuke K Kilroy, [place]PA-C sent at 04/10/2016  2:37 PM EDT ----- Please add Mag Ox 400 mg daily  LUKE KILROY PA-C 04/10/2016 2:37 PM

## 2016-04-13 DIAGNOSIS — K7460 Unspecified cirrhosis of liver: Secondary | ICD-10-CM | POA: Diagnosis not present

## 2016-04-13 DIAGNOSIS — D649 Anemia, unspecified: Secondary | ICD-10-CM | POA: Diagnosis not present

## 2016-04-13 DIAGNOSIS — R7301 Impaired fasting glucose: Secondary | ICD-10-CM | POA: Diagnosis not present

## 2016-04-13 DIAGNOSIS — E870 Hyperosmolality and hypernatremia: Secondary | ICD-10-CM | POA: Diagnosis not present

## 2016-04-19 ENCOUNTER — Ambulatory Visit (HOSPITAL_COMMUNITY)
Admission: RE | Admit: 2016-04-19 | Discharge: 2016-04-19 | Disposition: A | Discharge status: Home / Self Care | Payer: Medicare Other | Source: Ambulatory Visit | Attending: Cardiology | Admitting: Cardiology

## 2016-04-19 DIAGNOSIS — I340 Nonrheumatic mitral (valve) insufficiency: Secondary | ICD-10-CM | POA: Insufficient documentation

## 2016-04-19 DIAGNOSIS — I509 Heart failure, unspecified: Secondary | ICD-10-CM | POA: Insufficient documentation

## 2016-04-19 DIAGNOSIS — I110 Hypertensive heart disease with heart failure: Secondary | ICD-10-CM | POA: Diagnosis not present

## 2016-04-19 DIAGNOSIS — Z87891 Personal history of nicotine dependence: Secondary | ICD-10-CM | POA: Diagnosis not present

## 2016-04-19 DIAGNOSIS — I358 Other nonrheumatic aortic valve disorders: Secondary | ICD-10-CM | POA: Insufficient documentation

## 2016-04-19 DIAGNOSIS — I77810 Thoracic aortic ectasia: Secondary | ICD-10-CM | POA: Insufficient documentation

## 2016-04-19 DIAGNOSIS — I071 Rheumatic tricuspid insufficiency: Secondary | ICD-10-CM | POA: Insufficient documentation

## 2016-04-19 DIAGNOSIS — M7989 Other specified soft tissue disorders: Secondary | ICD-10-CM | POA: Diagnosis not present

## 2016-04-19 NOTE — Progress Notes (Signed)
*  PRELIMINARY RESULTS* Echocardiogram 2D Echocardiogram has been performed.  Billy Stone, Billy Stone 04/19/2016, 10:07 AM

## 2016-04-20 DIAGNOSIS — K7030 Alcoholic cirrhosis of liver without ascites: Secondary | ICD-10-CM | POA: Diagnosis not present

## 2016-04-20 DIAGNOSIS — I10 Essential (primary) hypertension: Secondary | ICD-10-CM | POA: Diagnosis not present

## 2016-04-20 DIAGNOSIS — I2510 Atherosclerotic heart disease of native coronary artery without angina pectoris: Secondary | ICD-10-CM | POA: Diagnosis not present

## 2016-04-20 DIAGNOSIS — J449 Chronic obstructive pulmonary disease, unspecified: Secondary | ICD-10-CM | POA: Diagnosis not present

## 2016-04-25 ENCOUNTER — Other Ambulatory Visit: Payer: Self-pay | Admitting: *Deleted

## 2016-04-25 ENCOUNTER — Encounter: Payer: Self-pay | Admitting: *Deleted

## 2016-04-25 ENCOUNTER — Other Ambulatory Visit: Payer: Self-pay

## 2016-04-25 NOTE — Patient Outreach (Signed)
[facility]) Care Management  04/25/2016  Billy BolkWalter L Stone July 09, 1951 [id number]  Referral from MD office:  Telephone call to patient who was advised of reason for call & of [facility].   Patient voices that he does not have any health care concerns. States he does have heart failure but it is under control. States he is taking medications as ordered. States he has no trouble getting medications or getting to doctors' appointments. Patient states he is independent with care but does have family member available if he needs help.   Patient was offered case management services -community and telephonic programs. [facility] has declined [facility]. States he will [facility] contact information. Advised patient that I would notify MD office that he has declined services.  Plan: Call to MD office- referral personnel & left message of the above.  Send MD closure letter. Send [facility] educational [facility] with [facility].   Colleen CanLinda Viveka Wilmeth, RN BSN CCM Care Management Coordinator [facility] Care [facility]  [phone]

## 2016-04-25 NOTE — Progress Notes (Signed)
Cardiology Office Note  Date: 04/26/2016   ID: JOHNMATTHEW SOLORIO, DOB Jan 10, 1951, MRN [id number]  PCP: [name], MD  Primary Cardiologist: Nona Dell, MD   Chief Complaint  Patient presents with  . Coronary Artery Disease    History of Present Illness: Billy Stone is a 65 y.o. male just seen recently in the office by Mr. Lenn Cal in mid August. He was scheduled for a follow-up echocardiogram at that time, results outlined below. He presents for a follow-up visit. He states that he feels better, leg edema has improved. He is taking low-dose Lasix daily and reports that he had lab work with Dr. Margo Aye done recently indicating low normal sodium level. He does not report any increasing shortness of breath or angina symptoms. We discussed the results of his echocardiogram and will plan to continue observation as before.  Past Medical History:  Diagnosis Date  . Alcoholism (HCC)   . Allergic rhinitis   . Bronchitis   . CAD (coronary artery disease), native coronary artery    Multivessel at cardiac catheterization 1999 - managed medically by Dr. Amil Amen  . Essential hypertension   . Glucose intolerance (pre-diabetes)   . History of SIADH   . History of UTI   . Leukocytosis 06/28/2015  . Prostatic hypertrophy   . Rib fractures   . Ulcer of the stomach and intestine     Current Outpatient Prescriptions  Medication Sig Dispense Refill  . acetaminophen (TYLENOL) 500 MG tablet Take 500-1,000 mg by mouth every 6 (six) hours as needed for mild pain.    . B Complex-C (SUPER B COMPLEX PO) Take 1 tablet by mouth daily.    Marland Kitchen Besifloxacin HCl (BESIVANCE) 0.6 % SUSP Apply 1 drop to eye 3 (three) times daily.    . Bromfenac Sodium (PROLENSA) 0.07 % SOLN Apply 1 drop to eye daily.    . chlorpheniramine (CHLOR-TRIMETON) 4 MG tablet Take 4 mg by mouth 2 (two) times daily.    . Difluprednate (DUREZOL) 0.05 % EMUL Apply 1 drop to eye 3 (three) times daily.    . diphenhydrAMINE (BENADRYL)  25 mg capsule Take 25 mg by mouth daily as needed for allergies.     . fluticasone (FLONASE) 50 MCG/ACT nasal spray Place 1 spray into both nostrils daily. 16 g 2  . folic acid (FOLVITE) 1 MG tablet Take 1 tablet (1 mg total) by mouth daily. 30 tablet 0  . furosemide (LASIX) 20 MG tablet TAKE ONE TABLET BY MOUTH ONCE DAILY 30 tablet 5  . magnesium oxide (MAG-OX) 400 MG tablet Take 1 tablet (400 mg total) by mouth daily. 90 tablet 3  . metoprolol (LOPRESSOR) 50 MG tablet TAKE ONE TABLET BY MOUTH TWICE DAILY 60 tablet 11  . Multiple Vitamin (MULTIVITAMIN WITH MINERALS) TABS tablet Take 1 tablet by mouth daily. 30 tablet 0  . nitroGLYCERIN (NITROSTAT) 0.4 MG SL tablet Place 1 tablet (0.4 mg total) under the tongue every 5 (five) minutes as needed for chest pain. 25 tablet 3  . omeprazole (PRILOSEC) 20 MG capsule TAKE ONE CAPSULE BY MOUTH EVERY MORNING AND AT SUPPER FOR 3 MONTHS, THEN ONCE DAILY FOREVER (Patient taking differently: TAKE ONE CAPSULE BY MOUTH ONCE DAILY) 60 capsule 5  . spironolactone (ALDACTONE) 50 MG tablet TAKE ONE TABLET BY MOUTH ONCE DAILY 30 tablet 5  . thiamine 100 MG tablet Take 1 tablet (100 mg total) by mouth daily. 30 tablet 0   No current facility-administered medications for this  visit.    Allergies:  Amlodipine besylate and Penicillins   Social History: The patient  reports that he quit smoking about 27 years ago. His smoking use included Cigarettes. He started smoking about 49 years ago. He has a 21.00 pack-year smoking history. He has never used smokeless tobacco. He reports that he drinks alcohol. He reports that he does not use drugs.   ROS:  Please see the history of present illness. Otherwise, complete review of systems is positive for mild fatigue.  All other systems are reviewed and negative.   Physical Exam: VS:  BP 110/64   Pulse 69   Ht 5\' 5"  (1.651 m)   Wt 159 lb (72.1 kg)   SpO2 99%   BMI 26.46 kg/m , BMI Body mass index is 26.46 kg/m.  Wt Readings  from Last 3 Encounters:  04/26/16 159 lb (72.1 kg)  04/07/16 169 lb 12.8 oz (77 kg)  03/29/16 160 lb 15 oz (73 kg)    Appears comfortable at rest. HEENT: Conjunctiva and lids normal, oropharynx clear. Neck: Supple, no elevated JVP or carotid bruits, no thyromegaly. Lungs: Clear to auscultation, nonlabored breathing at rest. Cardiac: Regular rate and rhythm, S4, no significant systolic murmur, no pericardial rub. Abdomen: Protuberant, nontender, bowel sounds present, no guarding or rebound. Extremities: Mild lower leg edema, distal pulses 2+.  ECG: I personally reviewed the tracing from 03/27/2016 which showed sinus rhythm with PACs and PVCs, nonspecific ST changes.  Recent Labwork: 03/27/2016: TSH 1.369 03/28/2016: ALT 21; AST 54 04/04/2016: Hemoglobin 9.8; Platelets 247 04/10/2016: B Natriuretic Peptide 390.0; BUN 6; Creatinine, Ser 0.65; Magnesium 1.5; Potassium 3.6; Sodium 124     Component Value Date/Time   CHOL 126 09/30/2009 0000   TRIG 198 (H) 09/30/2009 0000   HDL 44 09/30/2009 0000   CHOLHDL 2.9 Ratio 09/30/2009 0000   VLDL 40 09/30/2009 0000   LDLCALC 42 09/30/2009 0000    Other Studies Reviewed Today:  Echocardiogram 04/19/2016: Study Conclusions  - Left ventricle: The cavity size was normal. Wall thickness was at   the upper limits of normal. Systolic function was normal. The   estimated ejection fraction was in the range of 60% to 65%. Wall   motion was normal; there were no regional wall motion   abnormalities. Left ventricular diastolic function parameters   were normal. - Aortic valve: Possibly bicuspid; moderately calcified leaflets.   Cusp separation was reduced. Mean gradient (S): 4 mm Hg. Peak   gradient (S): 15 mm Hg. VTI ratio of LVOT to aortic valve: 0.79.   Valve area (VTI): 2.49 cm^2. Valve area (Vmax): 1.75 cm^2. Valve   area (Vmean): 2.55 cm^2. - Aortic root: The aortic root was mildly ectatic. - Mitral valve: Calcified annulus. There was trivial  regurgitation. - Right atrium: Central venous pressure (est): 3 mm Hg. - Tricuspid valve: There was trivial regurgitation. - Pulmonary arteries: PA peak pressure: 18 mm Hg (S). - Pericardium, extracardiac: There was no pericardial effusion.  Impressions:  - MAC with trivial mitral regurgitation. Mildly ectatic aortic   root. Possibly bicuspid aortic valve, moderately calcified   without definitive stenosis. Trivial tricuspid regurgitation with   PASP 18 mmHg.  Assessment and Plan:  1. Multivessel CAD being managed conservatively on medical therapy after discussion over time. He is not reporting any angina symptoms. He is on aspirin, beta blocker, and as needed nitroglycerin.  2. Recent echocardiogram reviewed showing normal LVEF and grossly normal diastolic function. He does have leg edema which is  multifactorial, probably related to low protein stores with alcoholism. He is using low-dose Lasix at this time.  Current medicines were reviewed with the patient today.  Disposition: Follow-up with me in 6 months.  Signed, Jonelle SidleSamuel G. Franco Duley, MD, [facility] 04/26/2016 10:49 AM    [facility] [address], [place], [place] [address] Phone: [phone]) [phone]; Fax: [phone]) [phone]

## 2016-04-26 ENCOUNTER — Encounter: Payer: Self-pay | Admitting: Cardiology

## 2016-04-26 ENCOUNTER — Ambulatory Visit (INDEPENDENT_AMBULATORY_CARE_PROVIDER_SITE_OTHER): Payer: Medicare Other | Admitting: Cardiology

## 2016-04-26 VITALS — BP 110/64 | HR 69 | Ht 65.0 in | Wt 159.0 lb

## 2016-04-26 DIAGNOSIS — M7989 Other specified soft tissue disorders: Secondary | ICD-10-CM | POA: Diagnosis not present

## 2016-04-26 DIAGNOSIS — I2510 Atherosclerotic heart disease of native coronary artery without angina pectoris: Secondary | ICD-10-CM

## 2016-04-26 NOTE — Patient Instructions (Signed)

## 2016-05-04 DIAGNOSIS — E43 Unspecified severe protein-calorie malnutrition: Secondary | ICD-10-CM | POA: Diagnosis not present

## 2016-05-04 DIAGNOSIS — K7460 Unspecified cirrhosis of liver: Secondary | ICD-10-CM | POA: Diagnosis not present

## 2016-05-04 DIAGNOSIS — D649 Anemia, unspecified: Secondary | ICD-10-CM | POA: Diagnosis not present

## 2016-05-04 DIAGNOSIS — Z23 Encounter for immunization: Secondary | ICD-10-CM | POA: Diagnosis not present

## 2016-05-04 DIAGNOSIS — E871 Hypo-osmolality and hyponatremia: Secondary | ICD-10-CM | POA: Diagnosis not present

## 2016-05-16 ENCOUNTER — Ambulatory Visit (INDEPENDENT_AMBULATORY_CARE_PROVIDER_SITE_OTHER): Payer: Medicare Other | Admitting: Gastroenterology

## 2016-05-16 ENCOUNTER — Encounter: Payer: Self-pay | Admitting: Gastroenterology

## 2016-05-16 VITALS — BP 115/67 | HR 71 | Temp 97.9°F | Ht 65.0 in | Wt 172.4 lb

## 2016-05-16 DIAGNOSIS — K7030 Alcoholic cirrhosis of liver without ascites: Secondary | ICD-10-CM

## 2016-05-16 DIAGNOSIS — I2510 Atherosclerotic heart disease of native coronary artery without angina pectoris: Secondary | ICD-10-CM | POA: Diagnosis not present

## 2016-05-16 NOTE — Patient Instructions (Signed)
I am proud of you!!  We will see you in 6 months!

## 2016-05-16 NOTE — Progress Notes (Signed)
cc'ed to pcp °

## 2016-05-16 NOTE — Assessment & Plan Note (Signed)
Recent hospitalization for hyponatremia in the setting of cirrhosis and ETOH abuse noted, now improved and at baseline. No ETOH use for 3 weeks and applauded on this. Unintentional weight loss now improved/resolved. Eating habits improved with cessation of ETOH. Continues to decline initial screening colonoscopy and EGD for variceal screening that is due now. Declining US abdomen for HCC screening. Discussed in detail the importance of this; due to financial constraints, he is unable to move forward. He is agreeable to discuss this again in 6 months. At baseline for patient. Return in 6 months. Patient to call if financial status changes in interim. Discussed going to social security to discuss any Medicare enrollment changes that would be more beneficial.
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
## 2016-06-05 ENCOUNTER — Encounter (HOSPITAL_COMMUNITY): Payer: Medicare Other | Attending: Hematology & Oncology

## 2016-06-05 DIAGNOSIS — F1010 Alcohol abuse, uncomplicated: Secondary | ICD-10-CM | POA: Diagnosis not present

## 2016-06-05 DIAGNOSIS — D72829 Elevated white blood cell count, unspecified: Secondary | ICD-10-CM | POA: Insufficient documentation

## 2016-06-05 DIAGNOSIS — D539 Nutritional anemia, unspecified: Secondary | ICD-10-CM | POA: Diagnosis not present

## 2016-06-05 DIAGNOSIS — D7589 Other specified diseases of blood and blood-forming organs: Secondary | ICD-10-CM | POA: Diagnosis not present

## 2016-06-05 LAB — IRON AND TIBC
IRON: 80 ug/dL (ref 45–182)
Saturation Ratios: 20 % (ref 17.9–39.5)
TIBC: 391 ug/dL (ref 250–450)
UIBC: 311 ug/dL

## 2016-06-05 LAB — CBC WITH DIFFERENTIAL/PLATELET
Basophils Absolute: 0.1 10*3/uL (ref 0.0–0.1)
Basophils Relative: 1 %
EOS ABS: 0.3 10*3/uL (ref 0.0–0.7)
Eosinophils Relative: 4 %
HEMATOCRIT: 34.8 % — AB (ref 39.0–52.0)
HEMOGLOBIN: 12.3 g/dL — AB (ref 13.0–17.0)
LYMPHS ABS: 3.9 10*3/uL (ref 0.7–4.0)
Lymphocytes Relative: 41 %
MCH: 35.8 pg — AB (ref 26.0–34.0)
MCHC: 35.3 g/dL (ref 30.0–36.0)
MCV: 101.2 fL — ABNORMAL HIGH (ref 78.0–100.0)
MONOS PCT: 13 %
Monocytes Absolute: 1.2 10*3/uL — ABNORMAL HIGH (ref 0.1–1.0)
NEUTROS PCT: 41 %
Neutro Abs: 4.1 10*3/uL (ref 1.7–7.7)
Platelets: 352 10*3/uL (ref 150–400)
RBC: 3.44 MIL/uL — ABNORMAL LOW (ref 4.22–5.81)
RDW: 12.7 % (ref 11.5–15.5)
WBC: 9.6 10*3/uL (ref 4.0–10.5)

## 2016-06-05 LAB — FERRITIN: FERRITIN: 324 ng/mL (ref 24–336)

## 2016-07-03 ENCOUNTER — Other Ambulatory Visit: Payer: Self-pay | Admitting: Cardiology

## 2016-07-12 DIAGNOSIS — Z23 Encounter for immunization: Secondary | ICD-10-CM | POA: Diagnosis not present

## 2016-08-04 ENCOUNTER — Ambulatory Visit (HOSPITAL_COMMUNITY): Payer: Medicare Other | Admitting: Oncology

## 2016-08-04 ENCOUNTER — Other Ambulatory Visit (HOSPITAL_COMMUNITY): Payer: Medicare Other

## 2016-08-25 ENCOUNTER — Encounter (HOSPITAL_COMMUNITY): Payer: Medicare Other | Attending: Oncology | Admitting: Oncology

## 2016-08-25 ENCOUNTER — Encounter (HOSPITAL_COMMUNITY): Payer: Self-pay | Admitting: Oncology

## 2016-08-25 ENCOUNTER — Encounter (HOSPITAL_COMMUNITY): Payer: Medicare Other

## 2016-08-25 VITALS — BP 146/66 | HR 74 | Temp 97.6°F | Resp 18 | Wt 171.6 lb

## 2016-08-25 DIAGNOSIS — F1010 Alcohol abuse, uncomplicated: Secondary | ICD-10-CM | POA: Diagnosis not present

## 2016-08-25 DIAGNOSIS — N400 Enlarged prostate without lower urinary tract symptoms: Secondary | ICD-10-CM | POA: Insufficient documentation

## 2016-08-25 DIAGNOSIS — R7303 Prediabetes: Secondary | ICD-10-CM | POA: Insufficient documentation

## 2016-08-25 DIAGNOSIS — Z9081 Acquired absence of spleen: Secondary | ICD-10-CM | POA: Insufficient documentation

## 2016-08-25 DIAGNOSIS — D7589 Other specified diseases of blood and blood-forming organs: Secondary | ICD-10-CM

## 2016-08-25 DIAGNOSIS — D72820 Lymphocytosis (symptomatic): Secondary | ICD-10-CM

## 2016-08-25 DIAGNOSIS — D72821 Monocytosis (symptomatic): Secondary | ICD-10-CM | POA: Diagnosis not present

## 2016-08-25 DIAGNOSIS — D539 Nutritional anemia, unspecified: Secondary | ICD-10-CM

## 2016-08-25 DIAGNOSIS — D72829 Elevated white blood cell count, unspecified: Secondary | ICD-10-CM

## 2016-08-25 DIAGNOSIS — K7030 Alcoholic cirrhosis of liver without ascites: Secondary | ICD-10-CM | POA: Diagnosis not present

## 2016-08-25 DIAGNOSIS — Z9889 Other specified postprocedural states: Secondary | ICD-10-CM | POA: Insufficient documentation

## 2016-08-25 DIAGNOSIS — I10 Essential (primary) hypertension: Secondary | ICD-10-CM | POA: Insufficient documentation

## 2016-08-25 DIAGNOSIS — Z87891 Personal history of nicotine dependence: Secondary | ICD-10-CM | POA: Insufficient documentation

## 2016-08-25 DIAGNOSIS — F1020 Alcohol dependence, uncomplicated: Secondary | ICD-10-CM | POA: Insufficient documentation

## 2016-08-25 LAB — CBC WITH DIFFERENTIAL/PLATELET
BASOS ABS: 0.1 10*3/uL (ref 0.0–0.1)
Basophils Relative: 1 %
Eosinophils Absolute: 0.3 10*3/uL (ref 0.0–0.7)
Eosinophils Relative: 3 %
HEMATOCRIT: 38.3 % — AB (ref 39.0–52.0)
Hemoglobin: 13.2 g/dL (ref 13.0–17.0)
LYMPHS ABS: 4 10*3/uL (ref 0.7–4.0)
LYMPHS PCT: 43 %
MCH: 34.4 pg — ABNORMAL HIGH (ref 26.0–34.0)
MCHC: 34.5 g/dL (ref 30.0–36.0)
MCV: 99.7 fL (ref 78.0–100.0)
MONO ABS: 0.8 10*3/uL (ref 0.1–1.0)
MONOS PCT: 9 %
NEUTROS ABS: 4.2 10*3/uL (ref 1.7–7.7)
Neutrophils Relative %: 44 %
Platelets: 306 10*3/uL (ref 150–400)
RBC: 3.84 MIL/uL — ABNORMAL LOW (ref 4.22–5.81)
RDW: 14.6 % (ref 11.5–15.5)
WBC: 9.4 10*3/uL (ref 4.0–10.5)

## 2016-08-25 LAB — FERRITIN: Ferritin: 178 ng/mL (ref 24–336)

## 2016-08-25 LAB — IRON AND TIBC
Iron: 82 ug/dL (ref 45–182)
SATURATION RATIOS: 21 % (ref 17.9–39.5)
TIBC: 386 ug/dL (ref 250–450)
UIBC: 304 ug/dL

## 2016-08-25 NOTE — Assessment & Plan Note (Addendum)
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  Labs today: CBC diff, iron/TIBC, ferritin.  I personally reviewed and went over laboratory results with the patient.  The results are noted within this dictation.  WBC is WNL and differential is WNL today.  Hgb and platelet count is WNL.  Iron studies are pending at the time of this dictation.  Labs in 9 months: CBC diff, CMET, anemia panel.  He has a history of iron deficiency.  He also has a macrocytosis without anemia.  He continues to consume 1 case of beer per week.  "It's hard to tell you how many I drink because I don't keep track."  He notes that his taste for EtOH has declined significantly.  EtOH abstinence is encouraged.  Return in 9 months for follow-up.  If all is well and he has not required IV iron in 2 years when he returns, annual follow-up would be reasonable.
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
## 2016-08-25 NOTE — Patient Instructions (Signed)
[facility] at [facility] Discharge Instructions  RECOMMENDATIONS MADE BY THE CONSULTANT AND ANY TEST RESULTS WILL BE SENT TO YOUR REFERRING PHYSICIAN.  You were seen today by Jenita Seashoreom Kefalas, PA-C Return to clinic for labs and follow up in 9 months Please try to taper yourself off alcohol.  If you need help with resources please let us know  Thank you for choosing [facility] at [facility] to provide your oncology and hematology care.  To afford each patient quality time with our provider, please arrive at least 15 minutes before your scheduled appointment time.    If you have a lab appointment with the Cancer Center please come in thru the  Main Entrance and check in at the main information desk  You need to re-schedule your appointment should you arrive 10 or more minutes late.  We strive to give you quality time with our providers, and arriving late affects you and other patients whose appointments are after yours.  Also, if you no show three or more times for appointments you may be dismissed from the clinic at the providers discretion.     Again, thank you for choosing [facility].  Our hope is that these requests will decrease the amount of time that you wait before being seen by our physicians.       _____________________________________________________________  Should you have questions after your visit to [facility], please contact our office at [phone] between the hours of 8:30 a.m. and 4:30 p.m.  Voicemails left after 4:30 p.m. will not be returned until the following business day.  For prescription refill requests, have your pharmacy contact our office.       Resources For Cancer Patients and their Caregivers ? American Cancer Society: Can assist with transportation, wigs, general needs, runs Look Good Feel Better.        [phone] ? Cancer Care: Provides financial assistance, online support  groups, medication/co-pay assistance.  1-800-813-HOPE 6094214633(4673) ? [facility] Assists [place] cancer patients and their families through emotional , educational and financial support.  940-564-5268435-308-1600 ? [place] DSS Where to apply for food stamps, Medicaid and utility assistance. (908)722-6642(870) [phone] ? [facility]: Transportation to medical appointments. 954-088-9342438-764-2872 ? Social Security Administration: May apply for disability if have a Stage IV cancer. [phone] ? [place] [facility], Disability and Transit Services: Assists with nutrition, care and transit needs. ([phone] [phone]  Cancer Center Support Programs: @10RELATIVEDAYS @ > Cancer Support Group  2nd Tuesday of the month 1pm-2pm, Journey Room  > Creative Journey  3rd Tuesday of the month 1130am-1pm, Journey Room  > Look Good Feel Better  1st Wednesday of the month 10am-12 noon, Journey Room (Call American Cancer Society to register [phone])
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
## 2016-09-05 ENCOUNTER — Ambulatory Visit: Payer: Medicare Other | Admitting: Cardiology
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
## 2016-09-08 ENCOUNTER — Ambulatory Visit: Payer: Medicare Other | Admitting: Cardiology

## 2016-09-08 ENCOUNTER — Encounter: Payer: Self-pay | Admitting: Cardiology
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
## 2016-09-27 ENCOUNTER — Ambulatory Visit (INDEPENDENT_AMBULATORY_CARE_PROVIDER_SITE_OTHER): Payer: Medicare Other | Admitting: Cardiology

## 2016-09-27 ENCOUNTER — Encounter: Payer: Self-pay | Admitting: Cardiology

## 2016-09-27 VITALS — BP 106/56 | HR 76 | Ht 65.0 in | Wt 172.0 lb

## 2016-09-27 DIAGNOSIS — I2510 Atherosclerotic heart disease of native coronary artery without angina pectoris: Secondary | ICD-10-CM | POA: Diagnosis not present

## 2016-09-27 DIAGNOSIS — I10 Essential (primary) hypertension: Secondary | ICD-10-CM | POA: Diagnosis not present

## 2016-09-27 DIAGNOSIS — Q231 Congenital insufficiency of aortic valve: Secondary | ICD-10-CM

## 2016-09-27 DIAGNOSIS — F1020 Alcohol dependence, uncomplicated: Secondary | ICD-10-CM | POA: Diagnosis not present

## 2016-09-27 DIAGNOSIS — Q2381 Bicuspid aortic valve: Secondary | ICD-10-CM

## 2016-09-27 MED ORDER — NITROGLYCERIN 0.4 MG SL SUBL: 0.4000 mg | SUBLINGUAL_TABLET | SUBLINGUAL | 3 refills | Status: DC | PRN

## 2016-09-27 NOTE — Patient Instructions (Signed)

## 2016-10-14 ENCOUNTER — Other Ambulatory Visit: Payer: Self-pay | Admitting: Nurse Practitioner

## 2016-10-23 DIAGNOSIS — J309 Allergic rhinitis, unspecified: Secondary | ICD-10-CM | POA: Diagnosis not present

## 2016-10-23 DIAGNOSIS — I2510 Atherosclerotic heart disease of native coronary artery without angina pectoris: Secondary | ICD-10-CM | POA: Diagnosis not present

## 2016-10-23 DIAGNOSIS — J449 Chronic obstructive pulmonary disease, unspecified: Secondary | ICD-10-CM | POA: Diagnosis not present

## 2016-10-23 DIAGNOSIS — M319 Necrotizing vasculopathy, unspecified: Secondary | ICD-10-CM | POA: Diagnosis not present

## 2016-11-13 ENCOUNTER — Ambulatory Visit: Payer: Medicare Other | Admitting: Gastroenterology

## 2017-04-11 ENCOUNTER — Other Ambulatory Visit: Payer: Self-pay | Admitting: Gastroenterology

## 2017-05-05 ENCOUNTER — Emergency Department (HOSPITAL_COMMUNITY): Payer: Medicare Other

## 2017-05-05 ENCOUNTER — Encounter (HOSPITAL_COMMUNITY): Payer: Self-pay | Admitting: Emergency Medicine

## 2017-05-05 ENCOUNTER — Observation Stay (HOSPITAL_COMMUNITY)
Admission: EM | Admit: 2017-05-05 | Discharge: 2017-05-07 | Disposition: A | Discharge status: Home / Self Care | Payer: Medicare Other | Attending: Internal Medicine | Admitting: Internal Medicine

## 2017-05-05 DIAGNOSIS — Z87891 Personal history of nicotine dependence: Secondary | ICD-10-CM | POA: Insufficient documentation

## 2017-05-05 DIAGNOSIS — K922 Gastrointestinal hemorrhage, unspecified: Secondary | ICD-10-CM | POA: Diagnosis present

## 2017-05-05 DIAGNOSIS — D62 Acute posthemorrhagic anemia: Secondary | ICD-10-CM | POA: Insufficient documentation

## 2017-05-05 DIAGNOSIS — K920 Hematemesis: Secondary | ICD-10-CM | POA: Insufficient documentation

## 2017-05-05 DIAGNOSIS — I2510 Atherosclerotic heart disease of native coronary artery without angina pectoris: Secondary | ICD-10-CM | POA: Insufficient documentation

## 2017-05-05 DIAGNOSIS — Z9081 Acquired absence of spleen: Secondary | ICD-10-CM | POA: Insufficient documentation

## 2017-05-05 DIAGNOSIS — R531 Weakness: Secondary | ICD-10-CM | POA: Insufficient documentation

## 2017-05-05 DIAGNOSIS — Z8249 Family history of ischemic heart disease and other diseases of the circulatory system: Secondary | ICD-10-CM | POA: Insufficient documentation

## 2017-05-05 DIAGNOSIS — Z79899 Other long term (current) drug therapy: Secondary | ICD-10-CM | POA: Diagnosis not present

## 2017-05-05 DIAGNOSIS — K921 Melena: Secondary | ICD-10-CM | POA: Diagnosis not present

## 2017-05-05 DIAGNOSIS — K5641 Fecal impaction: Secondary | ICD-10-CM | POA: Diagnosis not present

## 2017-05-05 DIAGNOSIS — R918 Other nonspecific abnormal finding of lung field: Secondary | ICD-10-CM | POA: Diagnosis not present

## 2017-05-05 DIAGNOSIS — F1010 Alcohol abuse, uncomplicated: Secondary | ICD-10-CM | POA: Diagnosis not present

## 2017-05-05 DIAGNOSIS — F1020 Alcohol dependence, uncomplicated: Secondary | ICD-10-CM | POA: Diagnosis not present

## 2017-05-05 DIAGNOSIS — K2950 Unspecified chronic gastritis without bleeding: Secondary | ICD-10-CM | POA: Insufficient documentation

## 2017-05-05 DIAGNOSIS — K7030 Alcoholic cirrhosis of liver without ascites: Secondary | ICD-10-CM | POA: Diagnosis not present

## 2017-05-05 DIAGNOSIS — E876 Hypokalemia: Secondary | ICD-10-CM | POA: Diagnosis not present

## 2017-05-05 DIAGNOSIS — E222 Syndrome of inappropriate secretion of antidiuretic hormone: Secondary | ICD-10-CM | POA: Diagnosis not present

## 2017-05-05 DIAGNOSIS — E871 Hypo-osmolality and hyponatremia: Secondary | ICD-10-CM | POA: Insufficient documentation

## 2017-05-05 DIAGNOSIS — N390 Urinary tract infection, site not specified: Secondary | ICD-10-CM | POA: Diagnosis not present

## 2017-05-05 DIAGNOSIS — K31811 Angiodysplasia of stomach and duodenum with bleeding: Secondary | ICD-10-CM | POA: Insufficient documentation

## 2017-05-05 DIAGNOSIS — K7460 Unspecified cirrhosis of liver: Secondary | ICD-10-CM | POA: Diagnosis present

## 2017-05-05 DIAGNOSIS — K7031 Alcoholic cirrhosis of liver with ascites: Secondary | ICD-10-CM | POA: Diagnosis not present

## 2017-05-05 DIAGNOSIS — J432 Centrilobular emphysema: Secondary | ICD-10-CM | POA: Diagnosis not present

## 2017-05-05 DIAGNOSIS — K5900 Constipation, unspecified: Secondary | ICD-10-CM | POA: Diagnosis not present

## 2017-05-05 DIAGNOSIS — I10 Essential (primary) hypertension: Secondary | ICD-10-CM | POA: Diagnosis not present

## 2017-05-05 DIAGNOSIS — Z862 Personal history of diseases of the blood and blood-forming organs and certain disorders involving the immune mechanism: Secondary | ICD-10-CM

## 2017-05-05 DIAGNOSIS — F1021 Alcohol dependence, in remission: Secondary | ICD-10-CM | POA: Diagnosis present

## 2017-05-05 DIAGNOSIS — R262 Difficulty in walking, not elsewhere classified: Secondary | ICD-10-CM | POA: Diagnosis not present

## 2017-05-05 LAB — CBC WITH DIFFERENTIAL/PLATELET
Basophils Absolute: 0.1 10*3/uL (ref 0.0–0.1)
Basophils Relative: 1 %
EOS ABS: 0 10*3/uL (ref 0.0–0.7)
Eosinophils Relative: 0 %
HCT: 38.4 % — ABNORMAL LOW (ref 39.0–52.0)
HEMOGLOBIN: 13.5 g/dL (ref 13.0–17.0)
LYMPHS ABS: 3.8 10*3/uL (ref 0.7–4.0)
Lymphocytes Relative: 28 %
MCH: 37.5 pg — AB (ref 26.0–34.0)
MCHC: 35.2 g/dL (ref 30.0–36.0)
MCV: 106.7 fL — ABNORMAL HIGH (ref 78.0–100.0)
MONO ABS: 1.8 10*3/uL — AB (ref 0.1–1.0)
Monocytes Relative: 13 %
NEUTROS PCT: 58 %
Neutro Abs: 8 10*3/uL — ABNORMAL HIGH (ref 1.7–7.7)
PLATELETS: 400 10*3/uL (ref 150–400)
RBC: 3.6 MIL/uL — ABNORMAL LOW (ref 4.22–5.81)
RDW: 13.4 % (ref 11.5–15.5)
WBC: 13.7 10*3/uL — ABNORMAL HIGH (ref 4.0–10.5)

## 2017-05-05 LAB — LIPASE, BLOOD: LIPASE: 54 U/L — AB (ref 11–51)

## 2017-05-05 LAB — COMPREHENSIVE METABOLIC PANEL
ALK PHOS: 77 U/L (ref 38–126)
ALT: 19 U/L (ref 17–63)
ANION GAP: 14 (ref 5–15)
AST: 41 U/L (ref 15–41)
Albumin: 3.2 g/dL — ABNORMAL LOW (ref 3.5–5.0)
BUN: 26 mg/dL — ABNORMAL HIGH (ref 6–20)
CALCIUM: 9.1 mg/dL (ref 8.9–10.3)
CO2: 26 mmol/L (ref 22–32)
Chloride: 84 mmol/L — ABNORMAL LOW (ref 101–111)
Creatinine, Ser: 0.82 mg/dL (ref 0.61–1.24)
GFR calc Af Amer: >60 mL/min (ref >60)
Glucose, Bld: 132 mg/dL — ABNORMAL HIGH (ref 65–99)
POTASSIUM: 3 mmol/L — AB (ref 3.5–5.1)
Sodium: 124 mmol/L — ABNORMAL LOW (ref 135–145)
TOTAL PROTEIN: 8 g/dL (ref 6.5–8.1)
Total Bilirubin: 2.1 mg/dL — ABNORMAL HIGH (ref 0.3–1.2)

## 2017-05-05 LAB — URINALYSIS, ROUTINE W REFLEX MICROSCOPIC
BILIRUBIN URINE: NEGATIVE
Glucose, UA: NEGATIVE mg/dL
Hgb urine dipstick: NEGATIVE
Ketones, ur: NEGATIVE mg/dL
NITRITE: NEGATIVE
PH: 5 (ref 5.0–8.0)
Protein, ur: 30 mg/dL — AB
SPECIFIC GRAVITY, URINE: 1.015 (ref 1.005–1.030)

## 2017-05-05 LAB — PROTIME-INR
INR: 1.11
Prothrombin Time: 14.2 seconds (ref 11.4–15.2)

## 2017-05-05 LAB — POC OCCULT BLOOD, ED: Fecal Occult Bld: POSITIVE — AB

## 2017-05-05 LAB — TROPONIN I: Troponin I: <0.03 ng/mL (ref <0.03)

## 2017-05-05 IMAGING — DX DG CHEST 2V
3 series · 3 of 3 positions shown · non-contrast
Comparison: Chest x-rays dated [DATE] and [DATE].

CLINICAL DATA: Weakness and constipation, constipated for 2 weeks
with lower abdominal pain. Weakness for 1 week. Difficulty sleeping
in eating.

EXAM:
CHEST  2 VIEW

[chest ap]
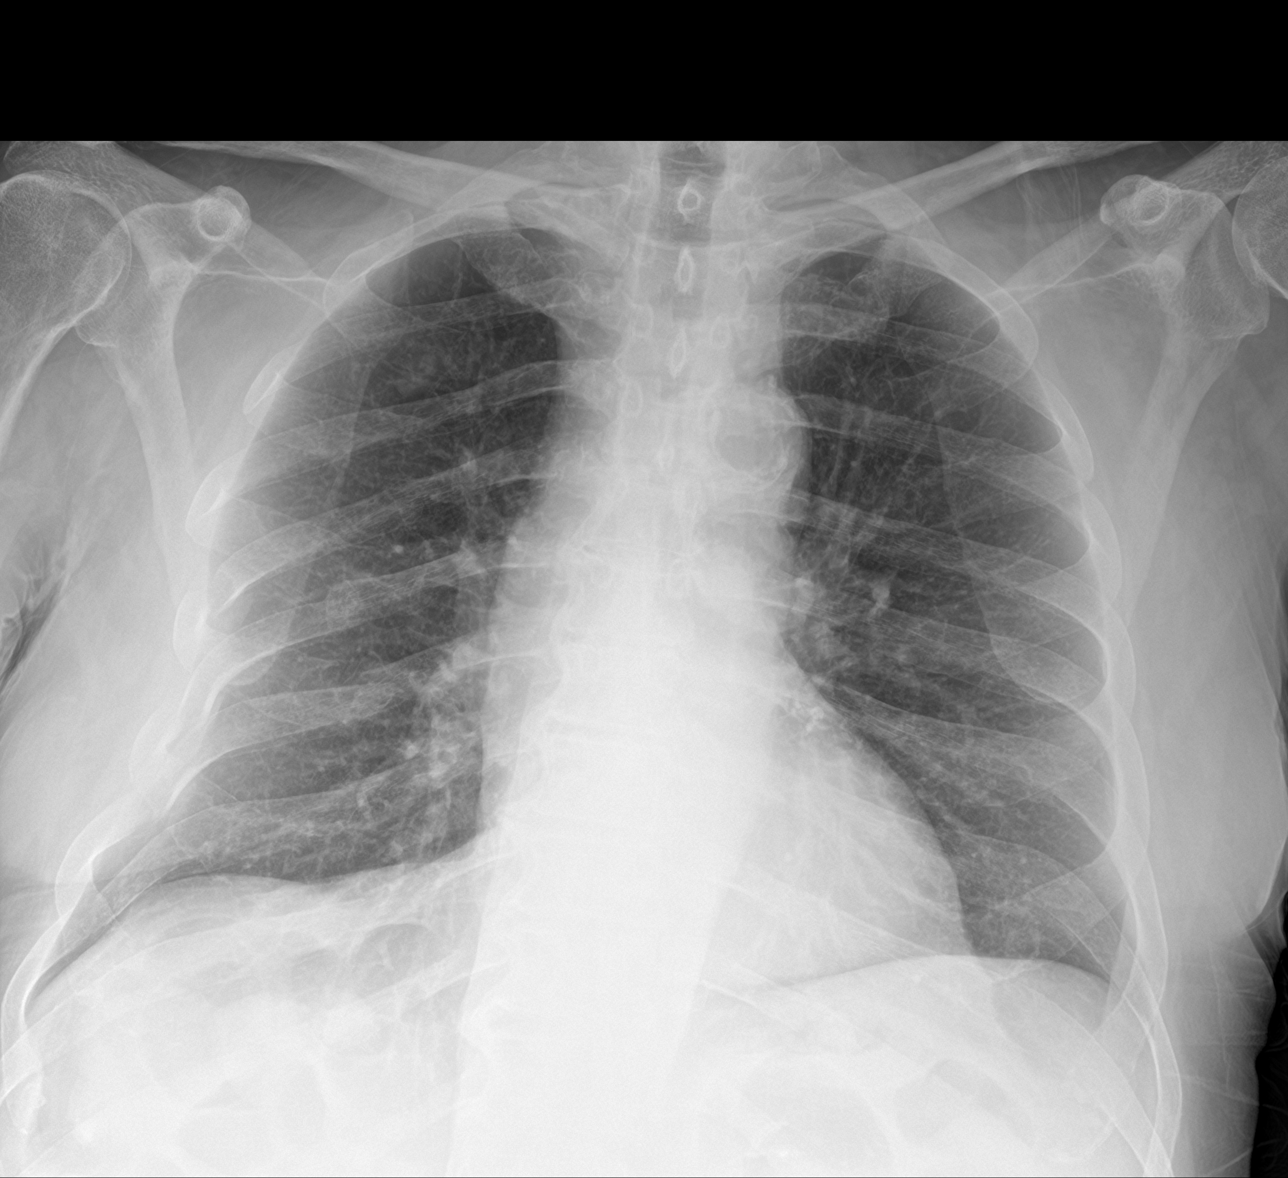

[chest lat (1 of 2)]
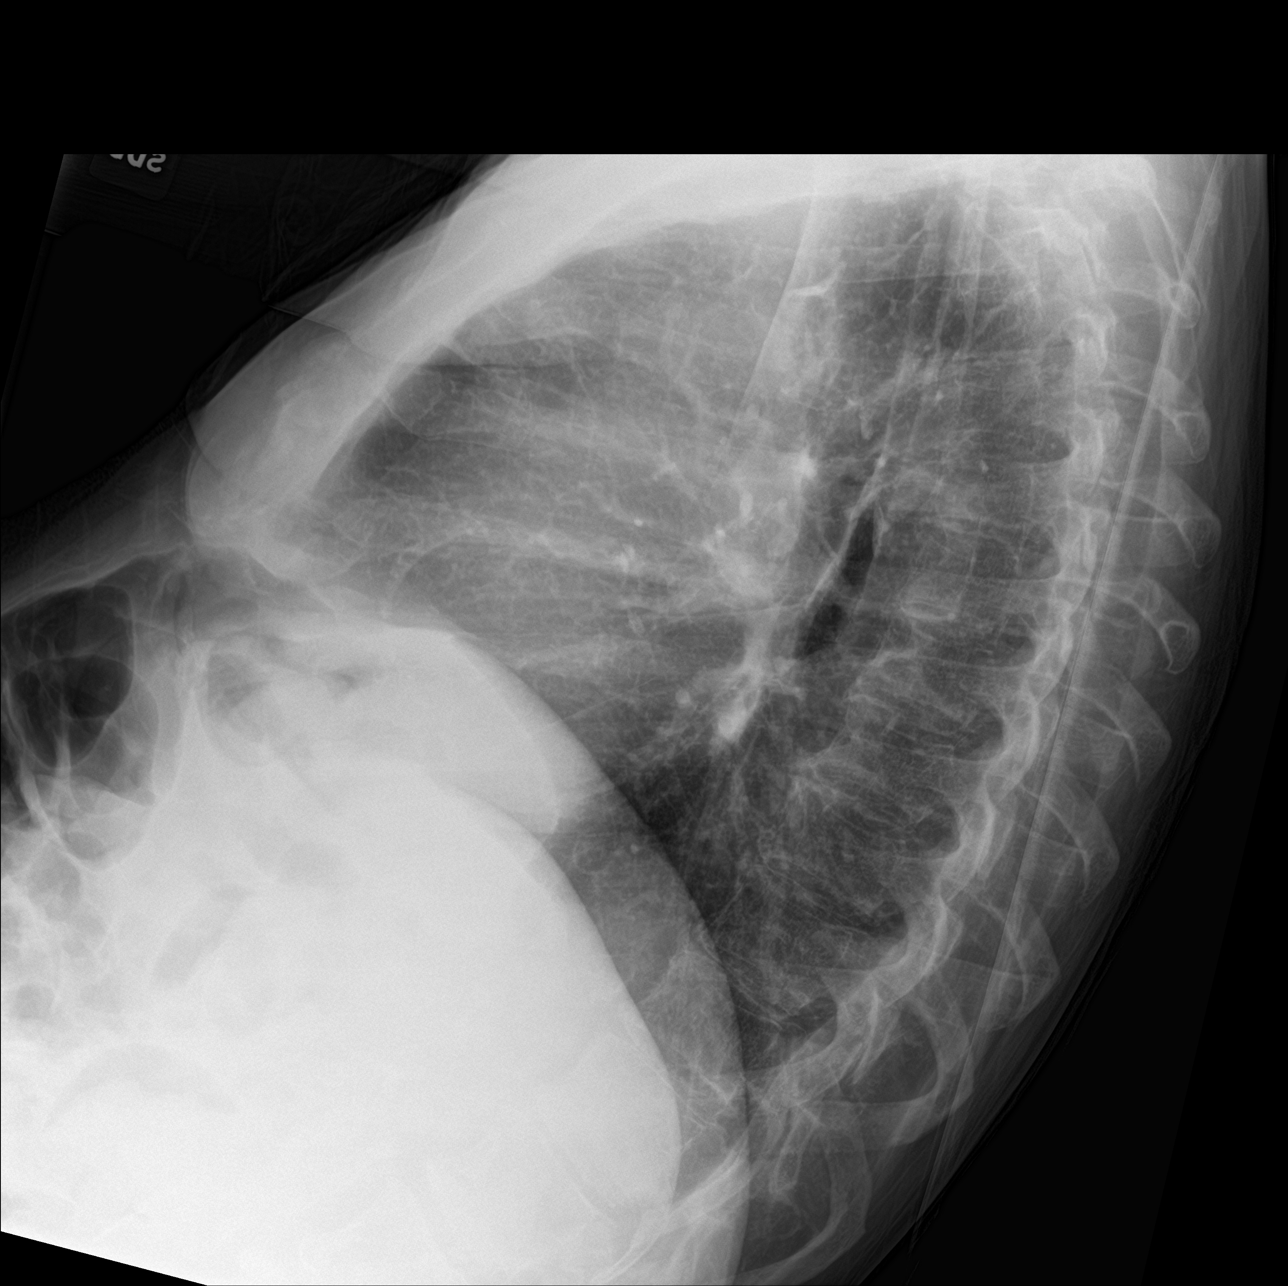

[chest lat (2 of 2)]
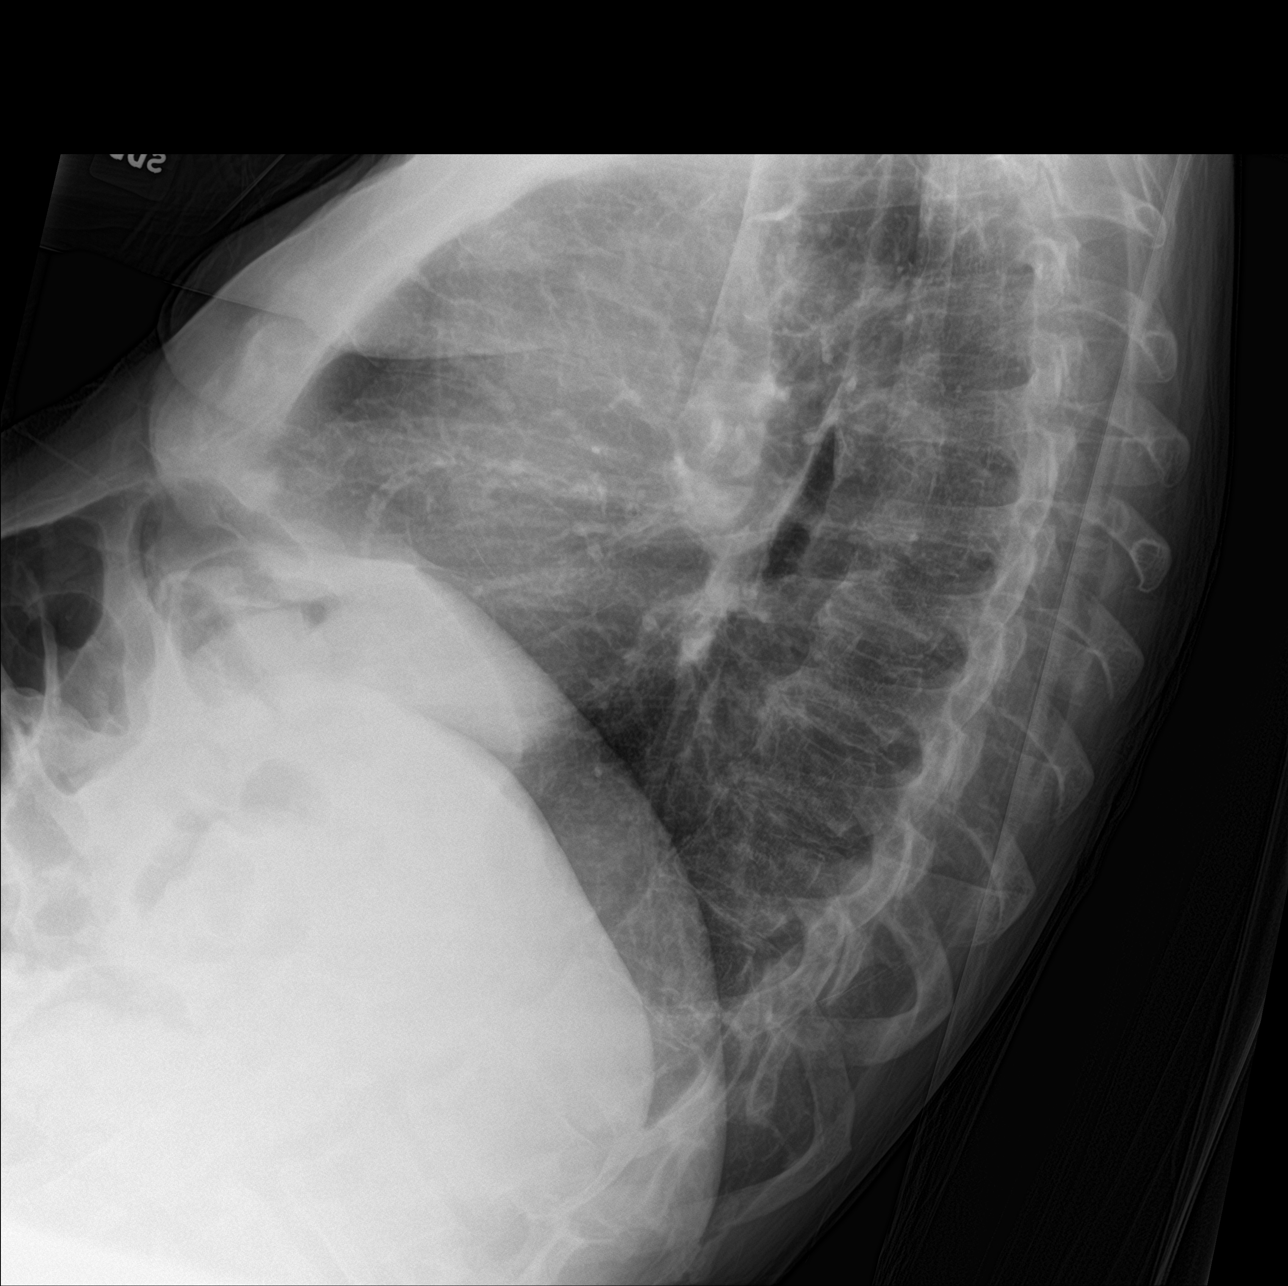

[3 of 3 positions shown; findings below may reference images not displayed]

FINDINGS: Heart size and mediastinal contours are stable. Atherosclerotic
changes noted at the aortic arch.

Questionable nodular density within the right upper lobe. Lungs
otherwise clear. No pleural effusion or pneumothorax seen.

Multiple old rib fractures again noted on the right. Mild
degenerative spurring noted within the thoracic spine. No acute or
suspicious osseous finding.
IMPRESSION: 1. Questionable small nodular density within the right upper lobe,
new compared to previous chest x-ray. This may represent
superimposition of normal pulmonary vessels, however, developing
neoplastic nodule cannot be confidently excluded. Recommend
nonemergent chest CT for further characterization.
2. Lungs otherwise clear. No evidence of pneumonia or pulmonary
edema.
3. Aortic atherosclerosis.

## 2017-05-05 IMAGING — CT CT ABD-PELV W/ CM
2 of 5 series · 15 of 46 positions shown, 17 images · IV contrast (Isovue)
Comparison: CT chest abdomen pelvis dated [DATE].

CLINICAL DATA: Pt with 2 weeks of constipation and weakness. Stool
positive for blood. Hx of cirrhosis of the liver. Elevated WBC. Hx
of splenectomy,CAD,HTN

EXAM:
CT ABDOMEN AND PELVIS WITH CONTRAST
TECHNIQUE: Multidetector CT imaging of the abdomen and pelvis was performed
using the standard protocol following bolus administration of
intravenous contrast.
CONTRAST:  100mL [KK] IOPAMIDOL ([KK]) INJECTION 61%

[Series 2: axial st · axial · 0.70mm/px · z∈[+718,+1138]mm · 12 of 96 slices shown, 14 images]
[im 6/96  soft-tissue]
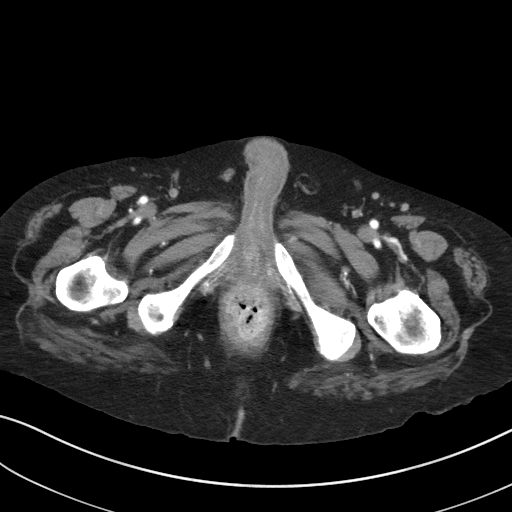
[im 6/96  bone]
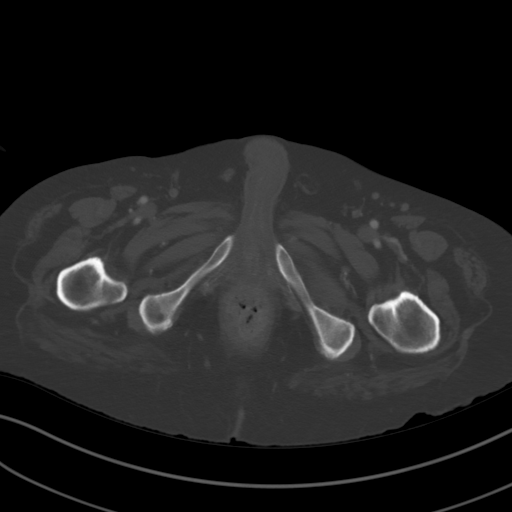
[im 17/96  soft-tissue]
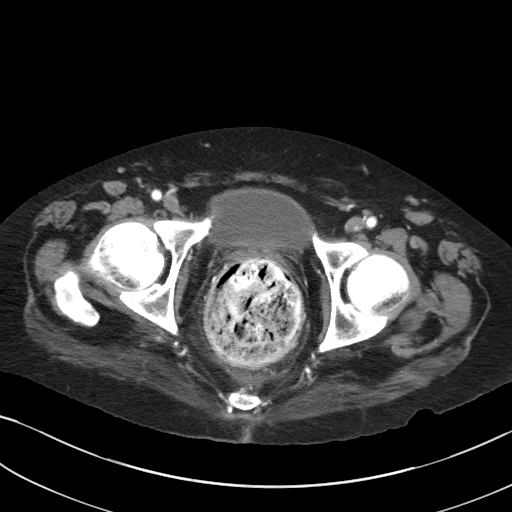
[im 23/96  soft-tissue]
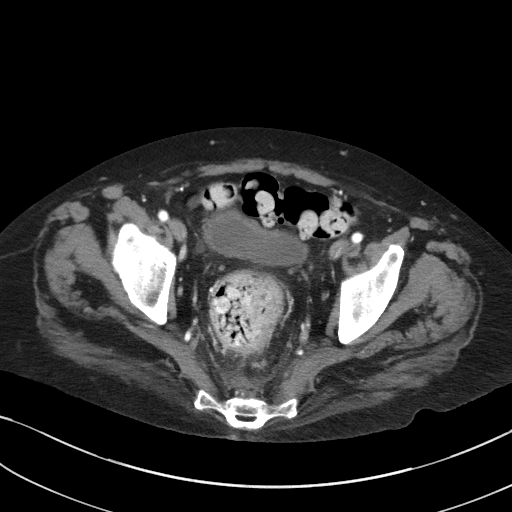
[im 28/96  soft-tissue]
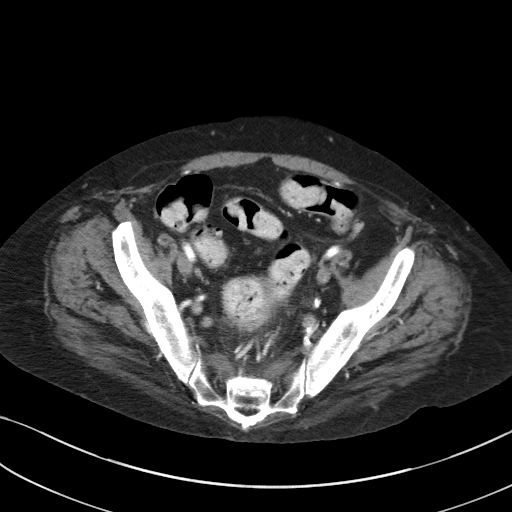
[im 40/96  soft-tissue]
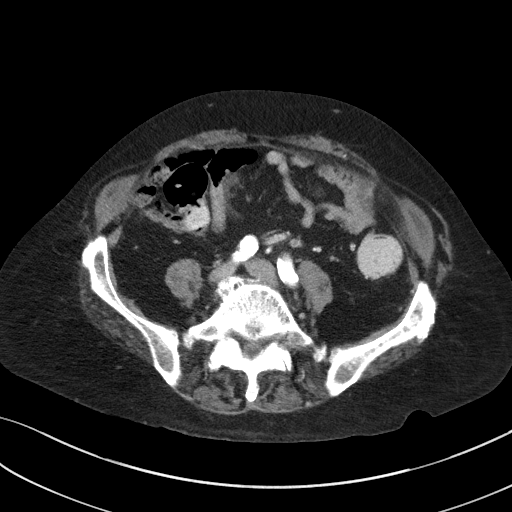
[im 45/96  soft-tissue]
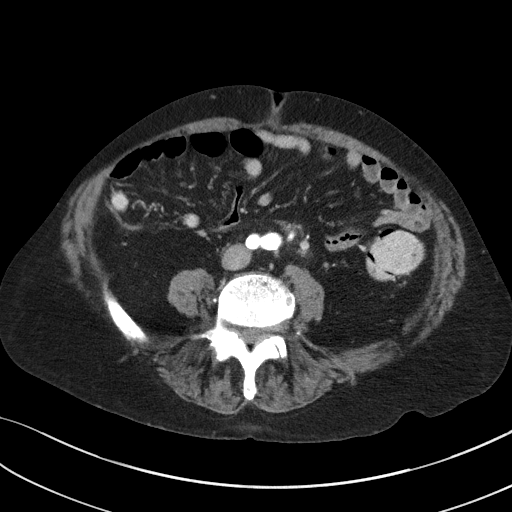
[im 51/96  soft-tissue]
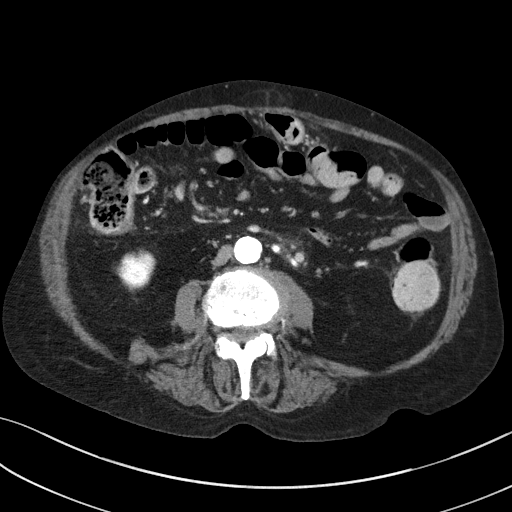
[im 62/96  soft-tissue]
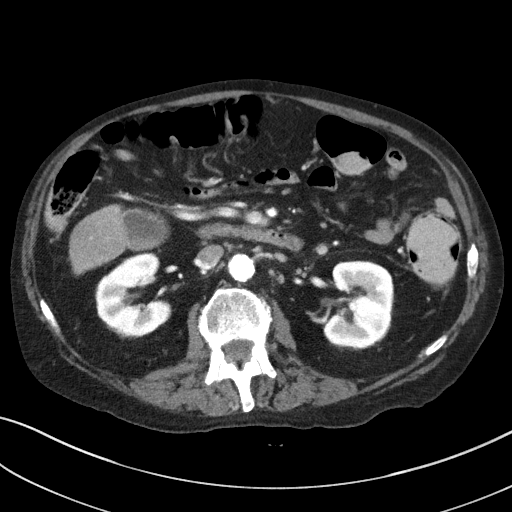
[im 68/96  soft-tissue]
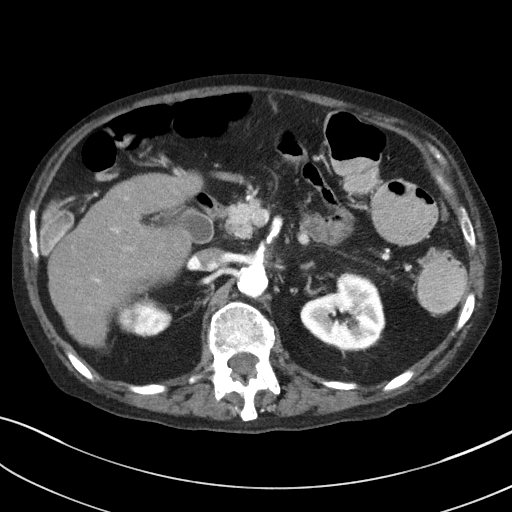
[im 68/96  bone]
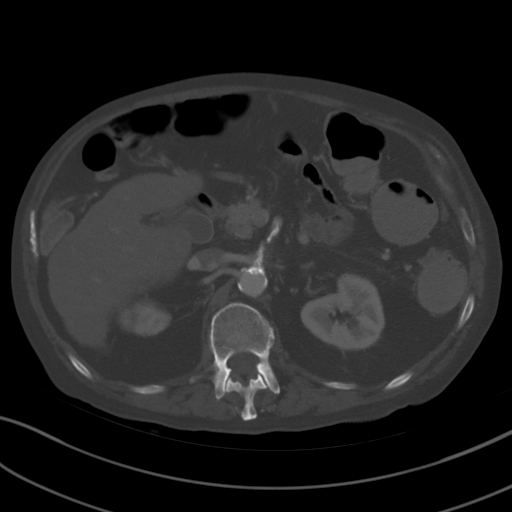
[im 73/96  soft-tissue]
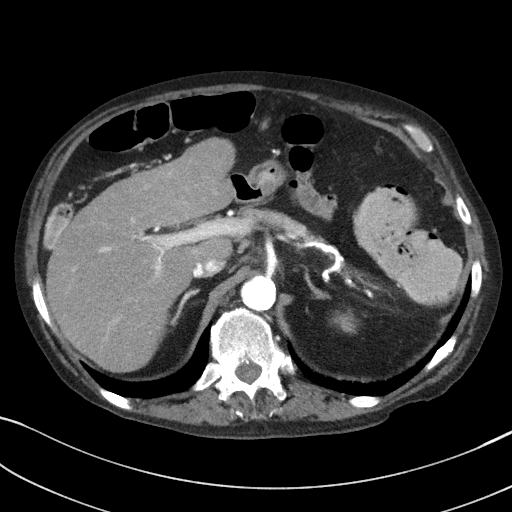
[im 84/96  soft-tissue]
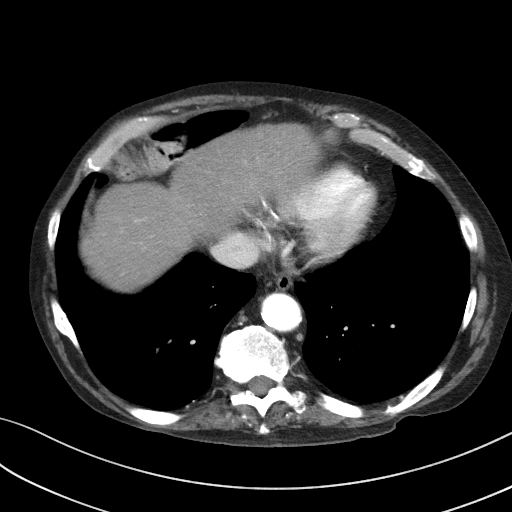
[im 90/96  soft-tissue]
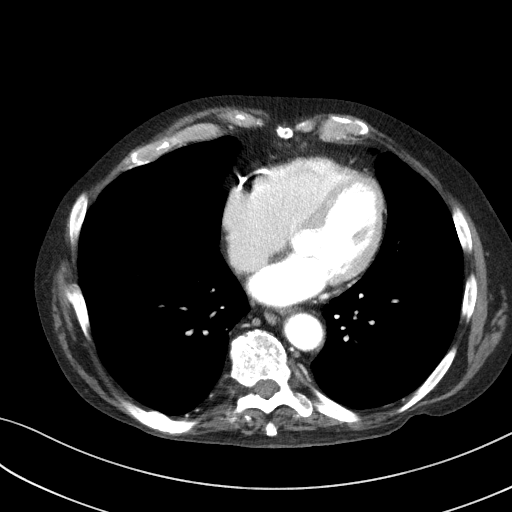

[Series 6: coronal st · coronal · 0.73mm/px · 3 of 99 slices shown]
[im 33/99  soft-tissue]
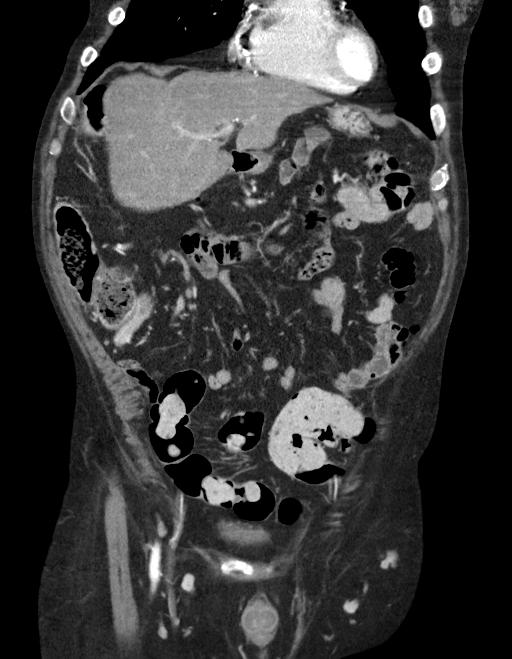
[im 44/99  soft-tissue]
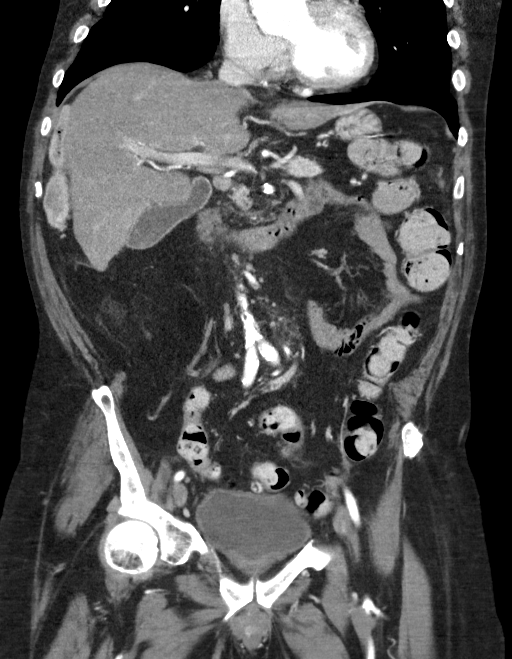
[im 55/99  soft-tissue]
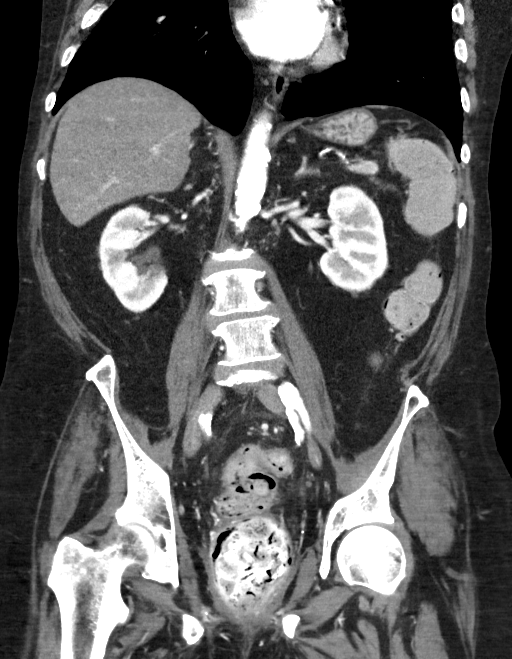

[15 of 46 positions shown; findings below may reference images not displayed]

FINDINGS: Lower chest: No acute abnormality. Incompletely imaged density
within the subcutaneous soft tissues of the lower left chest,
presumed gynecomastia but new compared to the earlier chest CT of
[DATE].

Hepatobiliary: Liver is low in density suggesting fatty
infiltration. Liver contours are also slightly nodular throughout
raising the possibility of underlying cirrhosis. No focal liver
abnormality is seen.

Gallbladder is unremarkable.  No bile duct dilatation seen.

Pancreas: Unremarkable. No pancreatic ductal dilatation or
surrounding inflammatory changes.

Spleen: Absent

Adrenals/Urinary Tract: Adrenal glands appear normal. Stable right
renal cyst. No renal stone or hydronephrosis bilaterally. No
perinephric fluid. Bladder is unremarkable, decompressed.

Stomach/Bowel: Large stool ball distending the rectal vault. Fairly
large amount of stool and gas throughout the remainder of the
nondistended colon.

No bowel wall thickening or evidence of bowel wall inflammation
seen. Stomach is unremarkable, decompressed. Appendix is not seen
but there are no inflammatory changes about the cecum to suggest
acute appendicitis.

Vascular/Lymphatic: Heavy atherosclerotic changes throughout the
abdominal aorta and involving the superior mesenteric, inferior
mesenteric and bilateral renal arteries. No acute appearing vascular
abnormality.

No enlarged lymph nodes appreciated within the abdomen or pelvis. No
free intraperitoneal air.

Reproductive: Prostate is unremarkable.

Other: No free fluid or abscess collection. No free intraperitoneal
air. Trace fluid stranding within the presacral space, likely
related to the adjacent rectal vault impaction.

Musculoskeletal: Degenerative changes throughout the slightly
scoliotic thoracolumbar spine, mild to moderate in degree. No acute
or suspicious osseous finding. Old rib fractures noted bilaterally.
A minimally displaced fracture of the posterior left tenth rib may
be subacute, not seen on the earlier CT.
IMPRESSION: 1. Large stool ball distending the rectal vault. Fairly large amount
of stool and gas throughout the remainder of the nondistended colon,
compatible with the given history of constipation.
2. **An incidental finding of potential clinical significance has
been found. Masslike density within the subcutaneous soft tissues of
the lower left chest, most likely benign gynecomastia but
incompletely imaged and new compared to the previous CT. A breast
cancer cannot be excluded on these limited images. Recommend
correlation with nonemergent mammogram at the [REDACTED].**
3. Probable fatty infiltration of the liver. Possible liver
cirrhosis.
4. Aortic atherosclerosis.

## 2017-05-05 MED ORDER — FLEET ENEMA 7-19 GM/118ML RE ENEM: 1.0000 | ENEMA | Freq: Once | RECTAL | Status: DC | PRN

## 2017-05-05 MED ORDER — VITAMIN B-12 100 MCG PO TABS
100.0000 ug | ORAL_TABLET | Freq: Every day | ORAL | Status: DC
  Administered 2017-05-05 – 2017-05-07 (×3): 100 ug via ORAL
  Filled 2017-05-05 (×3): qty 1

## 2017-05-05 MED ORDER — POTASSIUM CHLORIDE IN NACL 20-0.9 MEQ/L-% IV SOLN
INTRAVENOUS | Status: DC
  Administered 2017-05-05 – 2017-05-07 (×3): via INTRAVENOUS

## 2017-05-05 MED ORDER — PANTOPRAZOLE SODIUM 40 MG PO TBEC
40.0000 mg | DELAYED_RELEASE_TABLET | Freq: Every day | ORAL | Status: DC
  Filled 2017-05-05: qty 1

## 2017-05-05 MED ORDER — IOPAMIDOL (ISOVUE-300) INJECTION 61%
100.0000 mL | Freq: Once | INTRAVENOUS | Status: AC | PRN
  Administered 2017-05-05: 100 mL via INTRAVENOUS

## 2017-05-05 MED ORDER — DIPHENHYDRAMINE HCL 25 MG PO CAPS: 25.0000 mg | ORAL_CAPSULE | Freq: Every day | ORAL | Status: DC | PRN

## 2017-05-05 MED ORDER — TRAMADOL HCL 50 MG PO TABS
50.0000 mg | ORAL_TABLET | Freq: Three times a day (TID) | ORAL | Status: DC | PRN
  Administered 2017-05-06: 50 mg via ORAL
  Filled 2017-05-05: qty 1

## 2017-05-05 MED ORDER — BOOST / RESOURCE BREEZE PO LIQD
1.0000 | Freq: Three times a day (TID) | ORAL | Status: DC
  Administered 2017-05-05 – 2017-05-07 (×4): 1 via ORAL

## 2017-05-05 MED ORDER — ADULT MULTIVITAMIN W/MINERALS CH
1.0000 | ORAL_TABLET | Freq: Every day | ORAL | Status: DC
  Administered 2017-05-05 – 2017-05-07 (×3): 1 via ORAL
  Filled 2017-05-05 (×3): qty 1

## 2017-05-05 MED ORDER — LEVOFLOXACIN IN D5W 500 MG/100ML IV SOLN
500.0000 mg | INTRAVENOUS | Status: DC
  Administered 2017-05-05: 500 mg via INTRAVENOUS
  Filled 2017-05-05: qty 100

## 2017-05-05 MED ORDER — SODIUM CHLORIDE 0.9 % IV BOLUS (SEPSIS)
1000.0000 mL | Freq: Once | INTRAVENOUS | Status: AC
  Administered 2017-05-05: 1000 mL via INTRAVENOUS

## 2017-05-05 MED ORDER — ACETAMINOPHEN 325 MG RE SUPP: 325.0000 mg | Freq: Four times a day (QID) | RECTAL | Status: DC | PRN

## 2017-05-05 MED ORDER — ONDANSETRON HCL 4 MG PO TABS: 4.0000 mg | ORAL_TABLET | Freq: Four times a day (QID) | ORAL | Status: DC | PRN

## 2017-05-05 MED ORDER — SENNA 8.6 MG PO TABS
1.0000 | ORAL_TABLET | Freq: Two times a day (BID) | ORAL | Status: DC
  Administered 2017-05-05 – 2017-05-07 (×4): 8.6 mg via ORAL
  Filled 2017-05-05 (×4): qty 1

## 2017-05-05 MED ORDER — PANTOPRAZOLE SODIUM 40 MG IV SOLR
40.0000 mg | Freq: Two times a day (BID) | INTRAVENOUS | Status: DC
  Administered 2017-05-05: 40 mg via INTRAVENOUS
  Filled 2017-05-05 (×2): qty 40

## 2017-05-05 MED ORDER — ACETAMINOPHEN 500 MG PO TABS
500.0000 mg | ORAL_TABLET | Freq: Four times a day (QID) | ORAL | Status: DC | PRN
  Filled 2017-05-05: qty 1

## 2017-05-05 MED ORDER — SODIUM CHLORIDE 0.9 % IV SOLN
1000.0000 mL | INTRAVENOUS | Status: DC
  Administered 2017-05-05: 1000 mL via INTRAVENOUS

## 2017-05-05 MED ORDER — POTASSIUM CHLORIDE CRYS ER 20 MEQ PO TBCR
40.0000 meq | EXTENDED_RELEASE_TABLET | Freq: Once | ORAL | Status: AC
  Administered 2017-05-05: 40 meq via ORAL
  Filled 2017-05-05: qty 2

## 2017-05-05 MED ORDER — METOPROLOL TARTRATE 50 MG PO TABS
50.0000 mg | ORAL_TABLET | Freq: Two times a day (BID) | ORAL | Status: DC
  Administered 2017-05-05 – 2017-05-07 (×3): 50 mg via ORAL
  Filled 2017-05-05 (×4): qty 1

## 2017-05-05 MED ORDER — MAGNESIUM OXIDE 400 (241.3 MG) MG PO TABS
400.0000 mg | ORAL_TABLET | Freq: Every day | ORAL | Status: DC
  Administered 2017-05-05 – 2017-05-07 (×3): 400 mg via ORAL
  Filled 2017-05-05 (×3): qty 1

## 2017-05-05 MED ORDER — ONDANSETRON HCL 4 MG/2ML IJ SOLN: 4.0000 mg | Freq: Four times a day (QID) | INTRAMUSCULAR | Status: DC | PRN

## 2017-05-05 MED ORDER — TRAMADOL HCL 50 MG PO TABS: 50.0000 mg | ORAL_TABLET | Freq: Four times a day (QID) | ORAL | Status: DC | PRN

## 2017-05-05 NOTE — ED Notes (Signed)
Phlebotomy at bedside.

## 2017-05-05 NOTE — ED Notes (Signed)
Pt returned from CT °
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
## 2017-05-05 NOTE — Plan of Care (Signed)
Problem: Fluid Volume: Goal: Will show no signs and symptoms of excessive bleeding Outcome: Progressing Pt has had on BM on this shift that was brown in color, no blood seen (per pt) Pt for EGD in am, consent signed. Informed pt and daughter EGD is at 900am. Pt has no c/o pain, and states he has had several BMs since being admitted.
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
## 2017-05-05 NOTE — ED Provider Notes (Signed)
Medical screening examination/treatment/procedure(s) were conducted as a shared visit with non-physician practitioner(s) and myself.  I personally evaluated the patient during the encounter.   EKG Interpretation None      66 year old male with history of alcoholic cirrhosis, CAD, SiADH who presents with abdominal cramping, constipation, and generalized weakness. No fever, nausea, vomiting. Has noted intermittent dark stools. Endorses decreased appetite too.  Non-toxic. No acute distress. Vital signs normal. Abdomen soft and benign.  Blood work with chronic hyponatremia. Stable hemoglobin. Mild leukocytosis. UA concerning for UTI. Ct abd/pelvis visualized and without acute intraabdominal processes. There is however fecal impaction.  Manually disimpacted, with dark melanotic appearing stool that came subsequently after disimpaction.  Will treat for UTI. Given concern for Gi bleeding will admit for GI consult and monitoring.   Lavera GuiseLiu, Katty Fretwell Duo, MD 05/05/17 [phone]

## 2017-05-05 NOTE — ED Notes (Signed)
EDP at bedside updating patient and family. 
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
## 2017-05-05 NOTE — ED Notes (Addendum)
Pt transported to Xray. 

## 2017-05-05 NOTE — ED Triage Notes (Addendum)
Pt states he has been constipated for 2 weeks with lower abdominal pain. Pt states he been weak for 1 week and unable to sleep or eat.

## 2017-05-05 NOTE — ED Notes (Signed)
EDP at bedside  

## 2017-05-06 ENCOUNTER — Encounter (HOSPITAL_COMMUNITY)
Admission: EM | Disposition: A | Discharge status: Home / Self Care | Payer: Self-pay | Source: Home / Self Care | Attending: Emergency Medicine

## 2017-05-06 ENCOUNTER — Inpatient Hospital Stay (HOSPITAL_COMMUNITY): Payer: Medicare Other

## 2017-05-06 DIAGNOSIS — K5641 Fecal impaction: Secondary | ICD-10-CM | POA: Diagnosis not present

## 2017-05-06 DIAGNOSIS — K31811 Angiodysplasia of stomach and duodenum with bleeding: Secondary | ICD-10-CM | POA: Diagnosis not present

## 2017-05-06 DIAGNOSIS — J439 Emphysema, unspecified: Secondary | ICD-10-CM | POA: Diagnosis not present

## 2017-05-06 DIAGNOSIS — K228 Other specified diseases of esophagus: Secondary | ICD-10-CM | POA: Diagnosis not present

## 2017-05-06 DIAGNOSIS — K922 Gastrointestinal hemorrhage, unspecified: Secondary | ICD-10-CM | POA: Diagnosis not present

## 2017-05-06 DIAGNOSIS — F1010 Alcohol abuse, uncomplicated: Secondary | ICD-10-CM

## 2017-05-06 DIAGNOSIS — E871 Hypo-osmolality and hyponatremia: Secondary | ICD-10-CM | POA: Diagnosis not present

## 2017-05-06 DIAGNOSIS — N390 Urinary tract infection, site not specified: Secondary | ICD-10-CM | POA: Diagnosis not present

## 2017-05-06 DIAGNOSIS — K3189 Other diseases of stomach and duodenum: Secondary | ICD-10-CM | POA: Diagnosis not present

## 2017-05-06 DIAGNOSIS — K2970 Gastritis, unspecified, without bleeding: Secondary | ICD-10-CM | POA: Diagnosis not present

## 2017-05-06 DIAGNOSIS — K2951 Unspecified chronic gastritis with bleeding: Secondary | ICD-10-CM | POA: Diagnosis not present

## 2017-05-06 DIAGNOSIS — K7030 Alcoholic cirrhosis of liver without ascites: Secondary | ICD-10-CM | POA: Diagnosis not present

## 2017-05-06 HISTORY — PX: HOT HEMOSTASIS: SHX5433

## 2017-05-06 HISTORY — PX: ESOPHAGOGASTRODUODENOSCOPY: SHX5428

## 2017-05-06 LAB — COMPREHENSIVE METABOLIC PANEL
ALBUMIN: 2.6 g/dL — AB (ref 3.5–5.0)
ALT: 12 U/L — ABNORMAL LOW (ref 17–63)
ANION GAP: 10 (ref 5–15)
AST: 24 U/L (ref 15–41)
Alkaline Phosphatase: 69 U/L (ref 38–126)
BILIRUBIN TOTAL: 1.6 mg/dL — AB (ref 0.3–1.2)
BUN: 12 mg/dL (ref 6–20)
CHLORIDE: 97 mmol/L — AB (ref 101–111)
CO2: 24 mmol/L (ref 22–32)
Calcium: 8.6 mg/dL — ABNORMAL LOW (ref 8.9–10.3)
Creatinine, Ser: 0.49 mg/dL — ABNORMAL LOW (ref 0.61–1.24)
GFR calc Af Amer: >60 mL/min (ref >60)
GFR calc non Af Amer: >60 mL/min (ref >60)
GLUCOSE: 109 mg/dL — AB (ref 65–99)
POTASSIUM: 2.9 mmol/L — AB (ref 3.5–5.1)
Sodium: 131 mmol/L — ABNORMAL LOW (ref 135–145)
TOTAL PROTEIN: 6.4 g/dL — AB (ref 6.5–8.1)

## 2017-05-06 LAB — CBC
HEMATOCRIT: 33.7 % — AB (ref 39.0–52.0)
Hemoglobin: 11.7 g/dL — ABNORMAL LOW (ref 13.0–17.0)
MCH: 37.6 pg — ABNORMAL HIGH (ref 26.0–34.0)
MCHC: 34.7 g/dL (ref 30.0–36.0)
MCV: 108.4 fL — AB (ref 78.0–100.0)
Platelets: 400 10*3/uL (ref 150–400)
RBC: 3.11 MIL/uL — ABNORMAL LOW (ref 4.22–5.81)
RDW: 13.6 % (ref 11.5–15.5)
WBC: 10.3 10*3/uL (ref 4.0–10.5)

## 2017-05-06 LAB — MAGNESIUM: MAGNESIUM: 1.4 mg/dL — AB (ref 1.7–2.4)

## 2017-05-06 IMAGING — CT CT CHEST W/O CM
2 of 3 series · 15 of 36 positions shown, 18 images · non-contrast
Comparison: PA and lateral chest [DATE], [DATE] and
[DATE]. CT chest [DATE].

CLINICAL DATA: Possible right upper lobe nodule on prior chest
film.

EXAM:
CT CHEST WITHOUT CONTRAST
TECHNIQUE: Multidetector CT imaging of the chest was performed following the
standard protocol without IV contrast.

[Series 2: thorax · axial · 0.70mm/px · z∈[+947,+1245]mm · 12 of 177 slices shown, 15 images]
[im 14/177  mediastinal]
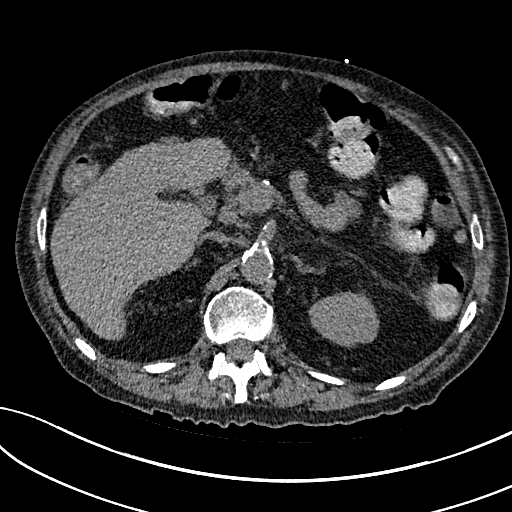
[im 14/177  lung]
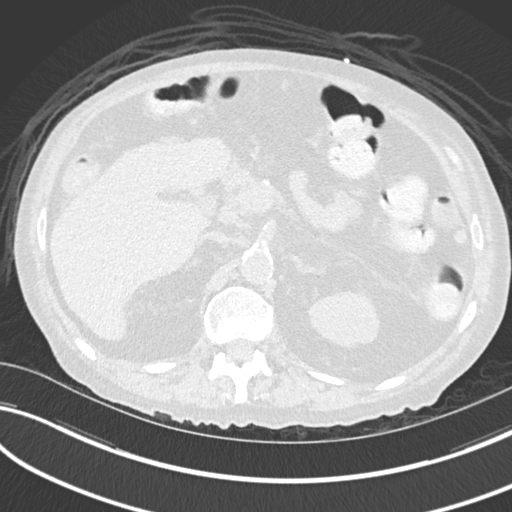
[im 27/177  lung]
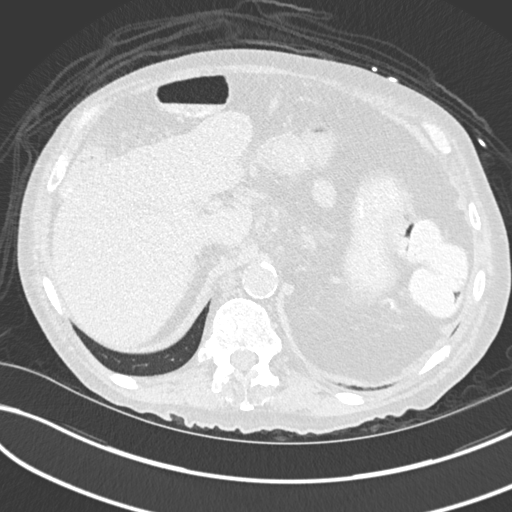
[im 40/177  lung]
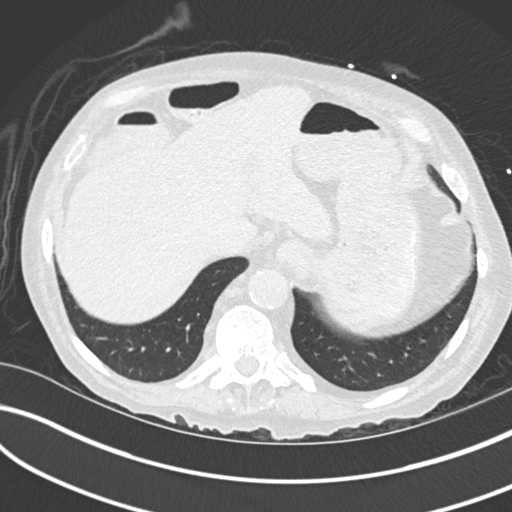
[im 53/177  lung]
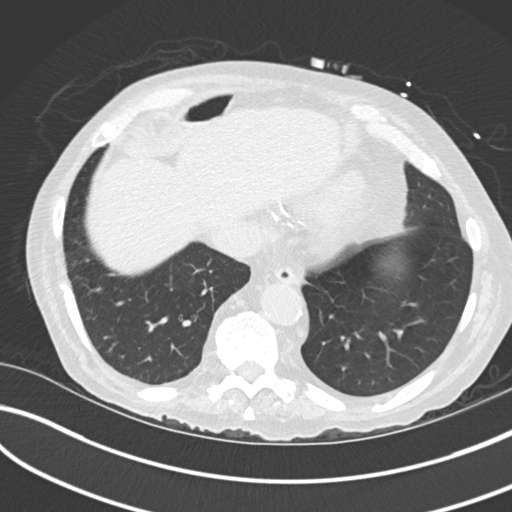
[im 66/177  mediastinal]
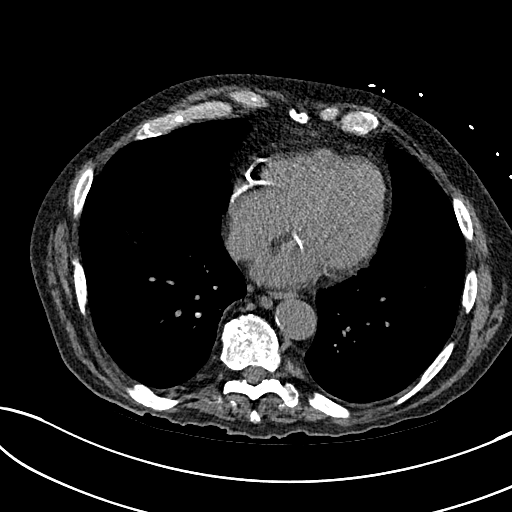
[im 66/177  lung]
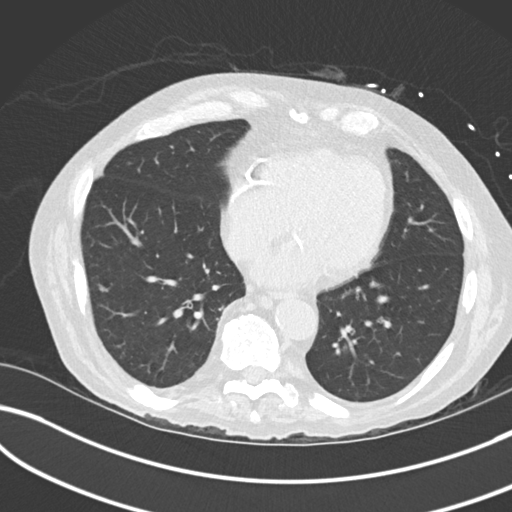
[im 79/177  lung]
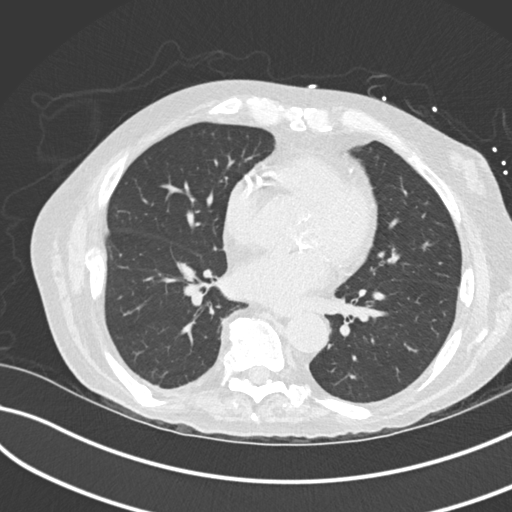
[im 98/177  lung]
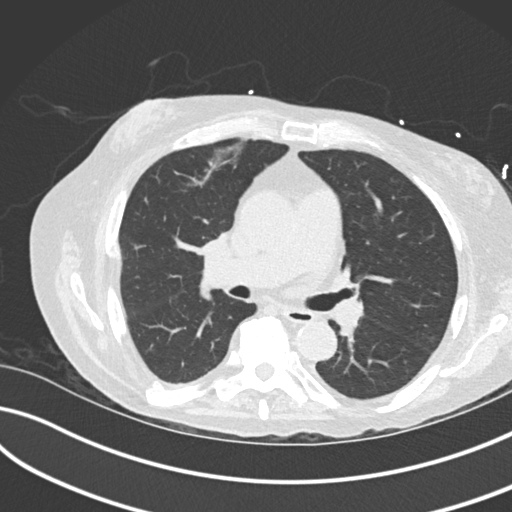
[im 111/177  lung]
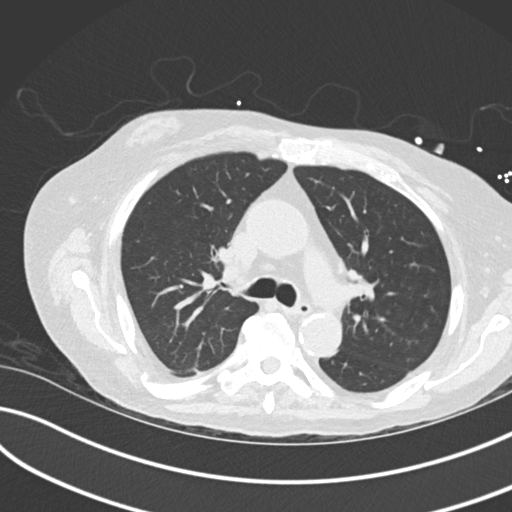
[im 124/177  mediastinal]
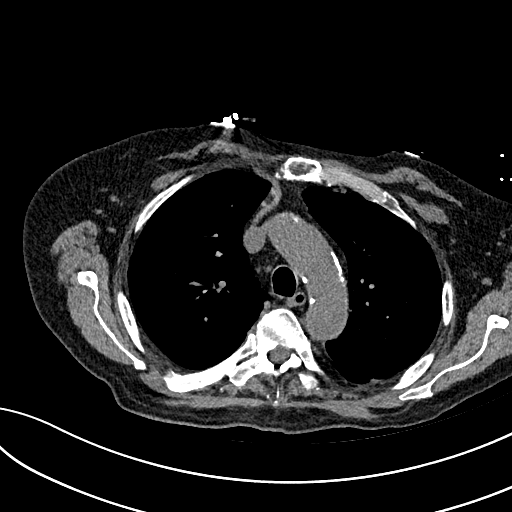
[im 124/177  lung]
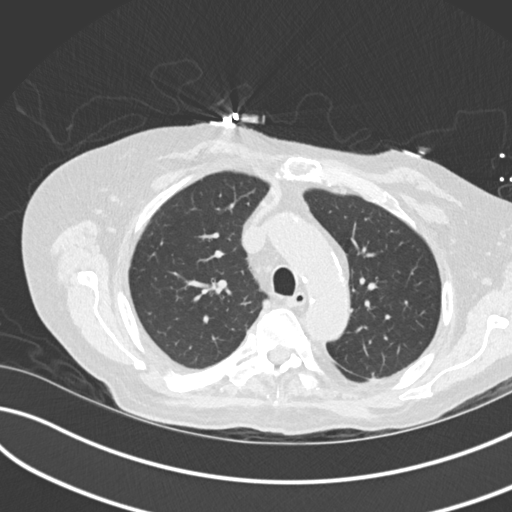
[im 137/177  lung]
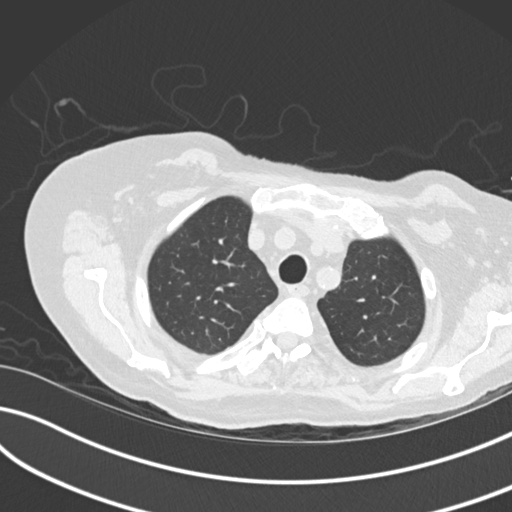
[im 150/177  lung]
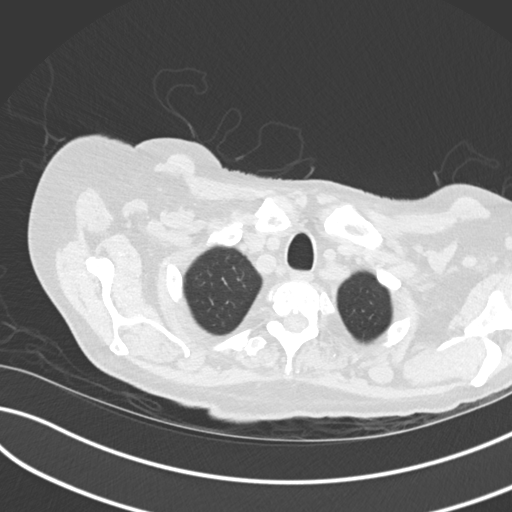
[im 163/177  lung]
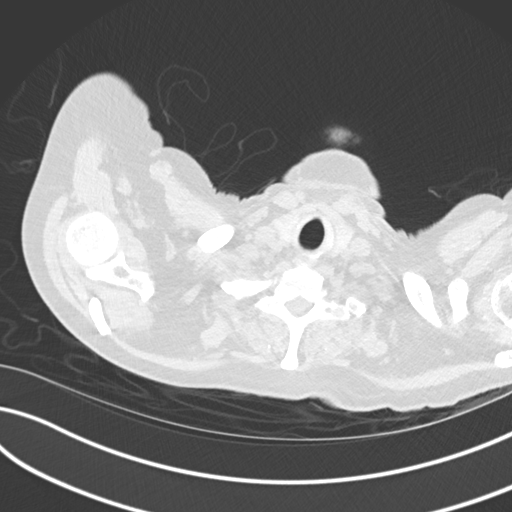

[Series 5: coronal · coronal · 0.69mm/px · 3 of 151 slices shown]
[im 31/151  lung]
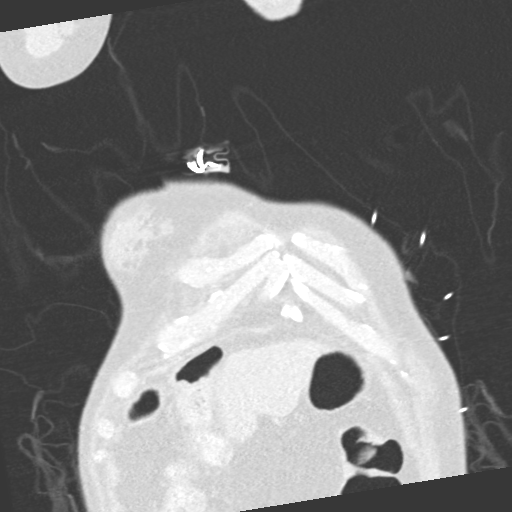
[im 61/151  lung]
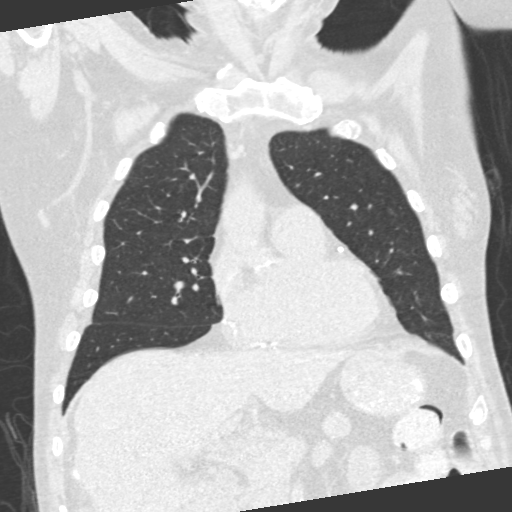
[im 91/151  lung]
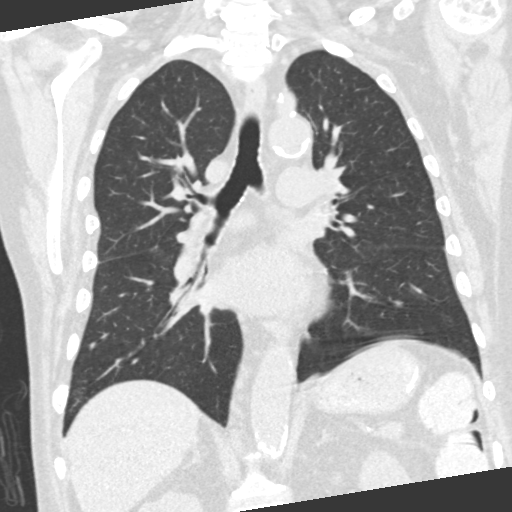

[15 of 36 positions shown; findings below may reference images not displayed]

FINDINGS: Cardiovascular: There is extensive calcific aortic and coronary
atherosclerosis. Heart size is normal. No pericardial effusion.

Mediastinum/Nodes: No enlarged mediastinal or axillary lymph nodes.
Thyroid gland, trachea, and esophagus demonstrate no significant
findings.

Lungs/Pleura: No pleural effusion. Mild centrilobular emphysema is
identified. There is no right upper lobe nodule or mass to correlate
with finding on plain film. The patient does have sign linear scar
in the anterior aspect of the right upper lobe which is unchanged
since the prior chest CT. The lungs are otherwise clear.

Upper Abdomen: The liver appears shrunken with a somewhat nodular
border. No focal liver lesion is identified. A few stones are seen
layering dependently in the gallbladder.

Musculoskeletal: Gynecomastia is noted. No lytic or sclerotic bony
lesion. Remote rib fractures are seen.
IMPRESSION: Negative for pulmonary nodule.  No acute cardiopulmonary disease.

Extensive calcific coronary atherosclerosis.

The liver appears shrunken with a nodular border consistent with
cirrhosis.

Gynecomastia.

Gallstones.

Aortic Atherosclerosis ([OG]-[OG]) and Emphysema ([OG]-[OG]).

## 2017-05-06 SURGERY — EGD (ESOPHAGOGASTRODUODENOSCOPY)
Anesthesia: Moderate Sedation

## 2017-05-06 MED ORDER — PANTOPRAZOLE SODIUM 40 MG PO TBEC
40.0000 mg | DELAYED_RELEASE_TABLET | Freq: Two times a day (BID) | ORAL | Status: DC
  Administered 2017-05-06 – 2017-05-07 (×2): 40 mg via ORAL
  Filled 2017-05-06 (×2): qty 1

## 2017-05-06 MED ORDER — BUTAMBEN-TETRACAINE-BENZOCAINE 2-2-14 % EX AERO
INHALATION_SPRAY | CUTANEOUS | Status: AC
  Filled 2017-05-06: qty 5

## 2017-05-06 MED ORDER — SIMETHICONE 40 MG/0.6ML PO SUSP
ORAL | Status: DC | PRN
  Administered 2017-05-06: 09:00:00

## 2017-05-06 MED ORDER — PANTOPRAZOLE SODIUM 40 MG IV SOLR
40.0000 mg | Freq: Two times a day (BID) | INTRAVENOUS | Status: DC
  Administered 2017-05-06: 40 mg via INTRAVENOUS

## 2017-05-06 MED ORDER — MEPERIDINE HCL 50 MG/ML IJ SOLN
INTRAMUSCULAR | Status: DC | PRN
Start: 2017-05-06 — End: 2017-05-06
  Administered 2017-05-06: 25 mg via INTRAVENOUS

## 2017-05-06 MED ORDER — POTASSIUM CHLORIDE CRYS ER 20 MEQ PO TBCR
40.0000 meq | EXTENDED_RELEASE_TABLET | Freq: Once | ORAL | Status: AC
  Administered 2017-05-06: 40 meq via ORAL
  Filled 2017-05-06: qty 2

## 2017-05-06 MED ORDER — PANTOPRAZOLE SODIUM 40 MG PO TBEC: 40.0000 mg | DELAYED_RELEASE_TABLET | Freq: Two times a day (BID) | ORAL | Status: DC

## 2017-05-06 MED ORDER — POTASSIUM CHLORIDE 10 MEQ/100ML IV SOLN
10.0000 meq | INTRAVENOUS | Status: AC
  Administered 2017-05-06 (×4): 10 meq via INTRAVENOUS
  Filled 2017-05-06 (×4): qty 100

## 2017-05-06 MED ORDER — BUTAMBEN-TETRACAINE-BENZOCAINE 2-2-14 % EX AERO
INHALATION_SPRAY | CUTANEOUS | Status: DC | PRN
  Administered 2017-05-06: 1 via TOPICAL

## 2017-05-06 MED ORDER — MIDAZOLAM HCL 5 MG/5ML IJ SOLN
INTRAMUSCULAR | Status: DC | PRN
  Administered 2017-05-06 (×2): 2 mg via INTRAVENOUS

## 2017-05-06 MED ORDER — MEPERIDINE HCL 50 MG/ML IJ SOLN
INTRAMUSCULAR | Status: AC
  Filled 2017-05-06: qty 1

## 2017-05-06 MED ORDER — MIDAZOLAM HCL 5 MG/5ML IJ SOLN
INTRAMUSCULAR | Status: AC
  Filled 2017-05-06: qty 10

## 2017-05-06 MED ORDER — SODIUM CHLORIDE 0.9 % IV BOLUS (SEPSIS)
500.0000 mL | Freq: Once | INTRAVENOUS | Status: AC
  Administered 2017-05-06: 500 mL via INTRAVENOUS

## 2017-05-06 NOTE — Progress Notes (Signed)
[facility] TEAM 2  Billy Stone  JXB:[id number] DOB: 15-Feb-1951 DOA: 05/05/2017 PCP: Benita Stabile, MD    Brief Narrative:  66 y.o. male with history of alcoholism w/ cirrhosis, hyponatremia, splenectomy, CAD managed medically, chronic urinary retention, and HTN who presented w/ a 2 week hx of generalized weakness and diffuse abdom pain associated w/ constipation.    In the ED the pt was found to be suffering w/ a significant fecal impaction.  After he was manually disimpacted, he began to pass melanotic stool.  GI was consulted by the ED Team.    Subjective: The pt underwent his EGD this morning w/o significant difficulty.  He denies current cp, sob, n/v, or abdom pain.  He reports a good appetite and intake.    Assessment & Plan:  Melena - guaiac + stool  EGD noted 1 AVM which as tx w/ APC as well as gastritis (whch has biopsied) - resume diet - pt not interested in a colonoscopy - transition PPI to oral dosing   Abnormal UA Empiric tx for UTI - no culture data thus far   Hyponatremia - chronic  baseline appears to be in the 120-125 range - has a reported hx of SIADH, but I doubt the accuracy of this hx as his Na is climbing w/ simple volume expansion - follow w/ ongoing IVF support   Hypokalemia Persists - due to poor intake and prob resultant signif total body deficit - cont to supplement and follow   Hypomagnesemia supplement Mg and follow   Macrocytic anemia  Likely due to toxic effect of EtOH on bone marrow - check B12 and folic acid  Alcoholic cirrhosis of the liver - alcoholism Patient admits that he continues to drink multiple beers a day  Multivessel coronary artery disease Asymptomatic at this time   Bicuspid aortic valve Noted per Dr. Diona Browner - being followed - no evidence of hemodynamic significance at this time - M appreciable on exam   RUL lung nodule  Will obtain CT chest to evaluate  Masslike density within the subcutaneous soft tissues of the  lower left chest Per Radiology "most likely benign gynecomastia... breast cancer cannot be excluded... [facility]" recommended - will need to arrange at time of d/c   HTN BP currently well controlled  DVT prophylaxis: SCDs Code Status: FULL CODE Family Communication: no family present at time of exam  Disposition Plan: PT/OT - begin to ambulate   Consultants:  GI  Procedures: EGD - 9/9  Antimicrobials:  Levaquin 9/8 >  Objective: Blood pressure 105/64, pulse 71, temperature 98.5 F (36.9 C), temperature source Oral, resp. rate 20, height  (1.651 m), weight 68.7 kg (151 lb 8 oz), SpO2 98 %.  Intake/Output Summary (Last 24 hours) at 05/06/17 1243 Last data filed at 05/06/17 1308  Gross per 24 hour  Intake             1925 ml  Output              601 ml  Net             1324 ml   Filed Weights   05/05/17 0907 05/05/17 1819  Weight: 79.4 kg (175 lb) 68.7 kg (151 lb 8 oz)    Examination: General: No acute respiratory distress - alert and oriented  Lungs: Clear to auscultation bilaterally without wheezes or crackles Cardiovascular: Regular rate and rhythm w/ 2/6[name] M Abdomen: Nontender, nondistended, soft, bowel  sounds positive, no rebound, no ascites, no appreciable mass Extremities: No significant edema bilateral lower extremities  CBC:  Recent Labs Lab 05/05/17 0931 05/06/17 0653  WBC 13.7* 10.3  NEUTROABS 8.0*  --   HGB 13.5 11.7*  HCT 38.4* 33.7*  MCV 106.7* 108.4*  PLT 400 400   Basic Metabolic Panel:  Recent Labs Lab 05/05/17 0931 05/06/17 0653  NA 124* 131*  K 3.0* 2.9*  CL 84* 97*  CO2 26 24  GLUCOSE 132* 109*  BUN 26* 12  CREATININE 0.82 0.49*  CALCIUM 9.1 8.6*  MG  --  1.4*   GFR: Estimated Creatinine Clearance: 76.9 mL/min (A) (by C-G formula based on SCr of 0.49 mg/dL (L)).  Liver Function Tests:  Recent Labs Lab 05/05/17 0931 05/06/17 0653  AST 41 24  ALT 19 12*  ALKPHOS 77 69    BILITOT 2.1* 1.6*  PROT 8.0 6.4*  ALBUMIN 3.2* 2.6*    Recent Labs Lab 05/05/17 0931  LIPASE 54*   Coagulation Profile:  Recent Labs Lab 05/05/17 0931  INR 1.11    Cardiac Enzymes:  Recent Labs Lab 05/05/17 0931  TROPONINI <0.03    HbA1C: Hgb A1c MFr Bld  Date/Time Value Ref Range Status  12/27/2014 06:32 AM 5.9 (H) 4.8 - 5.6 % Final    Comment:    (NOTE)         Pre-diabetes: 5.7 - 6.4         Diabetes: >6.4         Glycemic control for adults with diabetes: <7.0   04/23/2014 06:14 AM 5.4 <5.7 % Final    Comment:    (NOTE)                                                                       According to the ADA Clinical Practice Recommendations for 2011, when HbA1c is used as a screening test:  >=6.5%   Diagnostic of Diabetes Mellitus           (if abnormal result is confirmed) 5.7-6.4%   Increased risk of developing Diabetes Mellitus References:Diagnosis and Classification of Diabetes Mellitus,Diabetes Care,2011,34(Suppl 1):S62-S69 and Standards of Medical Care in         Diabetes - 2011,Diabetes Care,2011,34 (Suppl 1):S11-S61.    CBG: No results for input(s): GLUCAP in the last 168 hours.  Recent Results (from the past 240 hour(s))  Culture, blood (Routine X 2) w Reflex to ID Panel     Status: None (Preliminary result)   Collection Time: 05/05/17 10:40 AM  Result Value Ref Range Status   Specimen Description RIGHT ANTECUBITAL  Final   Special Requests   Final    BOTTLES DRAWN AEROBIC AND ANAEROBIC Blood Culture adequate volume   Culture NO GROWTH < 24 HOURS  Final   Report Status PENDING  Incomplete  Culture, blood (Routine X 2) w Reflex to ID Panel     Status: None (Preliminary result)   Collection Time: 05/05/17 10:40 AM  Result Value Ref Range Status   Specimen Description BLOOD RIGHT WRIST  Final   Special Requests   Final    BOTTLES DRAWN AEROBIC ONLY Blood Culture results may not be optimal due to an inadequate volume of blood received  in  culture bottles   Culture NO GROWTH < 24 HOURS  Final   Report Status PENDING  Incomplete     Scheduled Meds: . butamben-tetracaine-benzocaine      . meperidine      . midazolam      . feeding supplement  1 Container Oral TID BM  . magnesium oxide  400 mg Oral Daily  . metoprolol tartrate  50 mg Oral BID  . multivitamin with minerals  1 tablet Oral Daily  . pantoprazole  40 mg Oral BID AC  . senna  1 tablet Oral BID  . vitamin B-12  100 mcg Oral Daily     LOS: 1 day   Lonia BloodJeffrey T. Caelen Reierson, MD [facility] Office  [phone] Pager - Text Page per Loretha [email or web address] as per below:  On-Call/Text Page:      [email or web address]      password [email or web address]  If 7PM-7AM, please contact night-coverage [email or web address] Password [email or web address] 05/06/2017, 12:43 PM

## 2017-05-06 NOTE — Progress Notes (Signed)
Recent EGD note.  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 This lesion wed with APC. Antral gastritis with scarring. Biopsy taken. No evidence of esophageal or gastric varices.

## 2017-05-06 NOTE — Op Note (Signed)
[facility] Patient Name: Billy Stone Procedure Date: 05/06/2017 8:29 AM MRN: [id number] Date of Birth: 23-Jan-1951 Attending MD: Lionel December , MD CSN: [id number] Age: 66 Admit Type: Inpatient Procedure:                Upper GI endoscopy Indications:              Acute post hemorrhagic anemia, Melena Providers:                Lionel December, MD, Judee Clara, RN, Dyann Ruddle Referring MD:             Virgina Evener, MD Medicines:                Cetacaine spray, Meperidine 25 mg IV, Midazolam 4                            mg IV Complications:            No immediate complications. Estimated Blood Loss:     Estimated blood loss was minimal. Procedure:                Pre-Anesthesia Assessment:                           - Prior to the procedure, a History and Physical                            was performed, and patient medications and                            allergies were reviewed. The patient's tolerance of                            previous anesthesia was also reviewed. The risks                            and benefits of the procedure and the sedation                            options and risks were discussed with the patient.                            All questions were answered, and informed consent                            was obtained. Prior Anticoagulants: The patient                            last took ibuprofen 3 days prior to the procedure.                            ASA Grade Assessment: III - A patient with severe                            systemic disease. After reviewing the risks and  benefits, the patient was deemed in satisfactory                            condition to undergo the procedure.                           After obtaining informed consent, the endoscope was                            passed under direct vision. Throughout the                            procedure, the patient's blood pressure, pulse, and                     oxygen saturations were monitored continuously. The                            EG-[id number] ([id number]) scope was introduced through the                            mouth, and advanced to the second part of duodenum.                            The upper GI endoscopy was accomplished without                            difficulty. The patient tolerated the procedure                            well. Scope In: 9:13:42 AM Scope Out: 9:28:08 AM Total Procedure Duration: 0 hours 14 minutes 26 seconds  Findings:      The examined esophagus was normal.      The Z-line was irregular and was found 43 cm from the incisors.      A single small bleeding angiodysplastic lesion was found in the gastric       antrum. Coagulation for hemostasis using argon plasma was successful.      Patchy mild inflammation characterized by erythema and granularity was       found in the gastric antrum and in the prepyloric region of the stomach.       Biopsies were taken with a cold forceps for histology.      A scar was found in the gastric body and in the prepyloric region of the       stomach.      The cardia, gastric fundus, incisura and pylorus were normal.      The duodenal bulb and second portion of the duodenum were normal. Impression:               - Normal esophagus.                           - Z-line irregular, 43 cm from the incisors.                           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 Treated with argon plasma                            coagulation (APC).                           - Gastritis. Biopsied.                           - Scar in the prepyloric region of the stomach and                            in the gastric body.                           - Normal cardia, gastric fundus, incisura and                            pylorus.                           - Normal duodenal bulb and second portion of the                             duodenum. Moderate Sedation:      Moderate (conscious) sedation was administered by the endoscopy nurse       and supervised by the endoscopist. The following parameters were       monitored: oxygen saturation, heart rate, blood pressure, CO2       capnography and response to care. Total physician intraservice time was       20 minutes. Recommendation:           - Return patient to hospital ward for ongoing care.                           - Low sodium diet today.                           - Continue present medications.                           - Await pathology results.                           - Colonoscopy recommended patient has declined.                           - KCl 10 mEq in of D5W every hour for 4 doses.                           - Change pantoprazole to oral route. Procedure Code(s):        --- Professional ---                           [phone], Esophagogastroduodenoscopy, flexible,  transoral; with biopsy, single or multiple                           99152, Moderate sedation services provided by the                            same physician or other qualified health care                            professional performing the diagnostic or                            therapeutic service that the sedation supports,                            requiring the presence of an independent trained                            observer to assist in the monitoring of the                            patient's level of consciousness and physiological                            status; initial 15 minutes of intraservice time,                            patient age 17 years or older Diagnosis Code(s):        --- Professional ---                           K22.8, Other specified diseases of esophagus                           K31.811, Angiodysplasia of stomach and duodenum                            with bleeding                           K29.70, Gastritis,  unspecified, without bleeding                           K31.89, Other diseases of stomach and duodenum                           D62, Acute posthemorrhagic anemia                           K92.1, Melena (includes Hematochezia) CPT copyright 2016 American Medical Association. All rights reserved. The codes documented in this report are preliminary and upon coder review may  be revised to meet current compliance requirements. Lionel DecemberNajeeb Vestal Crandall, MD Lionel DecemberNajeeb Annabelle Rexroad, MD 05/06/2017 9:48:52 AM This report has been signed electronically. Number of Addenda: 0

## 2017-05-07 ENCOUNTER — Encounter: Payer: Self-pay | Admitting: Gastroenterology

## 2017-05-07 ENCOUNTER — Telehealth: Payer: Self-pay | Admitting: Gastroenterology

## 2017-05-07 DIAGNOSIS — K7030 Alcoholic cirrhosis of liver without ascites: Secondary | ICD-10-CM

## 2017-05-07 DIAGNOSIS — K274 Chronic or unspecified peptic ulcer, site unspecified, with hemorrhage: Secondary | ICD-10-CM

## 2017-05-07 DIAGNOSIS — K254 Chronic or unspecified gastric ulcer with hemorrhage: Secondary | ICD-10-CM

## 2017-05-07 DIAGNOSIS — K922 Gastrointestinal hemorrhage, unspecified: Secondary | ICD-10-CM | POA: Diagnosis not present

## 2017-05-07 LAB — BASIC METABOLIC PANEL
Anion gap: 7 (ref 5–15)
BUN: 5 mg/dL — AB (ref 6–20)
CHLORIDE: 100 mmol/L — AB (ref 101–111)
CO2: 22 mmol/L (ref 22–32)
Calcium: 8.6 mg/dL — ABNORMAL LOW (ref 8.9–10.3)
Creatinine, Ser: 0.44 mg/dL — ABNORMAL LOW (ref 0.61–1.24)
GFR calc non Af Amer: >60 mL/min (ref >60)
Glucose, Bld: 106 mg/dL — ABNORMAL HIGH (ref 65–99)
POTASSIUM: 4 mmol/L (ref 3.5–5.1)
SODIUM: 129 mmol/L — AB (ref 135–145)

## 2017-05-07 LAB — CBC
HCT: 34.8 % — ABNORMAL LOW (ref 39.0–52.0)
Hemoglobin: 11.7 g/dL — ABNORMAL LOW (ref 13.0–17.0)
MCH: 37.1 pg — AB (ref 26.0–34.0)
MCHC: 33.6 g/dL (ref 30.0–36.0)
MCV: 110.5 fL — ABNORMAL HIGH (ref 78.0–100.0)
Platelets: 442 10*3/uL — ABNORMAL HIGH (ref 150–400)
RBC: 3.15 MIL/uL — AB (ref 4.22–5.81)
RDW: 13.6 % (ref 11.5–15.5)
WBC: 11.4 10*3/uL — ABNORMAL HIGH (ref 4.0–10.5)

## 2017-05-07 LAB — FERRITIN: Ferritin: 288 ng/mL (ref 24–336)

## 2017-05-07 LAB — IRON AND TIBC
IRON: 41 ug/dL — AB (ref 45–182)
SATURATION RATIOS: 21 % (ref 17.9–39.5)
TIBC: 193 ug/dL — AB (ref 250–450)
UIBC: 152 ug/dL

## 2017-05-07 LAB — PHOSPHORUS: PHOSPHORUS: 1.8 mg/dL — AB (ref 2.5–4.6)

## 2017-05-07 LAB — RETICULOCYTES
RBC.: 3.15 MIL/uL — ABNORMAL LOW (ref 4.22–5.81)
Retic Count, Absolute: 81.9 10*3/uL (ref 19.0–186.0)
Retic Ct Pct: 2.6 % (ref 0.4–3.1)

## 2017-05-07 LAB — VITAMIN B12: VITAMIN B 12: 3205 pg/mL — AB (ref 180–914)

## 2017-05-07 LAB — FOLATE: FOLATE: 22 ng/mL (ref >5.9)

## 2017-05-07 LAB — MAGNESIUM: Magnesium: 1.4 mg/dL — ABNORMAL LOW (ref 1.7–2.4)

## 2017-05-07 MED ORDER — PANTOPRAZOLE SODIUM 40 MG PO TBEC: 40.0000 mg | DELAYED_RELEASE_TABLET | Freq: Every day | ORAL | 1 refills | Status: DC

## 2017-05-07 MED ORDER — MAGNESIUM SULFATE 2 GM/50ML IV SOLN
2.0000 g | Freq: Once | INTRAVENOUS | Status: AC
  Administered 2017-05-07: 2 g via INTRAVENOUS
  Filled 2017-05-07: qty 50

## 2017-05-07 MED ORDER — MAGNESIUM OXIDE 400 (241.3 MG) MG PO TABS: 400.0000 mg | ORAL_TABLET | Freq: Two times a day (BID) | ORAL | 3 refills | Status: DC

## 2017-05-07 NOTE — Progress Notes (Signed)
Subjective: States he feels much better. No overt GI bleeding. No abdominal pain. No N/V. Tolerating diet. Not interested in a colonoscopy.   Objective: Vital signs in last 24 hours: Temp:  [97.8 F (36.6 C)-98.3 F (36.8 C)] 97.8 F (36.6 C) (09/09 2100) Pulse Rate:  [60-81] 70 (09/10 0500) Resp:  [15-30] 18 (09/10 0500) BP: (87-125)/(48-102) 117/70 (09/10 0500) SpO2:  [98 %-100 %] 100 % (09/10 0500) Last BM Date: 05/06/17 General:   Alert and oriented, pleasant Head:  Normocephalic and atraumatic. Eyes:  No icterus, sclera clear. Conjuctiva pink.  Abdomen:  Bowel sounds present, soft, non-tender, non-distended. Query umbilical hernia Msk:  Symmetrical without gross deformities. Normal posture. Extremities:  Without  edema. Neurologic:  Alert and  oriented x4 Psych:  Alert and cooperative. Normal mood and affect.  Intake/Output from previous day: 09/09 0701 - 09/10 0700 In: 720 [P.O.:120; I.V.:600] Out: 3050 [Urine:3050] Intake/Output this shift: No intake/output data recorded.  Lab Results:  Recent Labs  05/05/17 0931 05/06/17 0653 05/07/17 0610  WBC 13.7* 10.3 11.4*  HGB 13.5 11.7* 11.7*  HCT 38.4* 33.7* 34.8*  PLT 400 400 442*   BMET  Recent Labs  05/05/17 0931 05/06/17 0653 05/07/17 0610  NA 124* 131* 129*  K 3.0* 2.9* 4.0  CL 84* 97* 100*  CO2 26 24 22   GLUCOSE 132* 109* 106*  BUN 26* 12 5*  CREATININE 0.82 0.49* 0.44*  CALCIUM 9.1 8.6* 8.6*   LFT  Recent Labs  05/05/17 0931 05/06/17 0653  PROT 8.0 6.4*  ALBUMIN 3.2* 2.6*  AST 41 24  ALT 19 12*  ALKPHOS 77 69  BILITOT 2.1* 1.6*   PT/INR  Recent Labs  05/05/17 0931  LABPROT 14.2  INR 1.11    Studies/Results: Dg Chest 2 View  Result Date: 05/05/2017 CLINICAL DATA:  Weakness and constipation, constipated for 2 weeks with lower abdominal pain. Weakness for 1 week. Difficulty sleeping in eating. EXAM: CHEST  2 VIEW COMPARISON:  Chest x-rays dated 03/27/2016 and 12/26/2014.  FINDINGS: Heart size and mediastinal contours are stable. Atherosclerotic changes noted at the aortic arch. Questionable nodular density within the right upper lobe. Lungs otherwise clear. No pleural effusion or pneumothorax seen. Multiple old rib fractures again noted on the right. Mild degenerative spurring noted within the thoracic spine. No acute or suspicious osseous finding. IMPRESSION: 1. Questionable small nodular density within the right upper lobe, new compared to previous chest x-ray. This may represent superimposition of normal pulmonary vessels, however, developing neoplastic nodule cannot be confidently excluded. Recommend nonemergent chest CT for further characterization. 2. Lungs otherwise clear. No evidence of pneumonia or pulmonary edema. 3. Aortic atherosclerosis. Electronically Signed   By: Bary RichardStan  Maynard M.D.   On: 05/05/2017 13:15   Ct Chest Wo Contrast  Result Date: 05/06/2017 CLINICAL DATA:  Possible right upper lobe nodule on prior chest film. EXAM: CT CHEST WITHOUT CONTRAST TECHNIQUE: Multidetector CT imaging of the chest was performed following the standard protocol without IV contrast. COMPARISON:  PA and lateral chest 05/05/2017, 03/27/2016 and 12/26/2014. CT chest 04/23/2014. FINDINGS: Cardiovascular: There is extensive calcific aortic and coronary atherosclerosis. Heart size is normal. No pericardial effusion. Mediastinum/Nodes: No enlarged mediastinal or axillary lymph nodes. Thyroid gland, trachea, and esophagus demonstrate no significant findings. Lungs/Pleura: No pleural effusion. Mild centrilobular emphysema is identified. There is no right upper lobe nodule or mass to correlate with finding on plain film. The patient does have sign linear scar in the anterior aspect of the right  upper lobe which is unchanged since the prior chest CT. The lungs are otherwise clear. Upper Abdomen: The liver appears shrunken with a somewhat nodular border. No focal liver lesion is identified. A  few stones are seen layering dependently in the gallbladder. Musculoskeletal: Gynecomastia is noted. No lytic or sclerotic bony lesion. Remote rib fractures are seen. IMPRESSION: Negative for pulmonary nodule.  No acute cardiopulmonary disease. Extensive calcific coronary atherosclerosis. The liver appears shrunken with a nodular border consistent with cirrhosis. Gynecomastia. Gallstones. Aortic Atherosclerosis (ICD10-I70.0) and Emphysema (ICD10-J43.9). Electronically Signed   By: Drusilla Kanner M.D.   On: 05/06/2017 20:38   Ct Abdomen Pelvis W Contrast  Result Date: 05/05/2017 CLINICAL DATA:  Pt with 2 weeks of constipation and weakness. Stool positive for blood. Hx of cirrhosis of the liver. Elevated WBC. Hx of splenectomy,CAD,HTN EXAM: CT ABDOMEN AND PELVIS WITH CONTRAST TECHNIQUE: Multidetector CT imaging of the abdomen and pelvis was performed using the standard protocol following bolus administration of intravenous contrast. CONTRAST:  ISOVUE-300 IOPAMIDOL (ISOVUE-300) INJECTION 61% COMPARISON:  CT chest abdomen pelvis dated 04/23/2014. FINDINGS: Lower chest: No acute abnormality. Incompletely imaged density within the subcutaneous soft tissues of the lower left chest, presumed gynecomastia but new compared to the earlier chest CT of 04/23/2014. Hepatobiliary: Liver is low in density suggesting fatty infiltration. Liver contours are also slightly nodular throughout raising the possibility of underlying cirrhosis. No focal liver abnormality is seen. Gallbladder is unremarkable.  No bile duct dilatation seen. Pancreas: Unremarkable. No pancreatic ductal dilatation or surrounding inflammatory changes. Spleen: Absent Adrenals/Urinary Tract: Adrenal glands appear normal. Stable right renal cyst. No renal stone or hydronephrosis bilaterally. No perinephric fluid. Bladder is unremarkable, decompressed. Stomach/Bowel: Large stool ball distending the rectal vault. Fairly large amount of stool and gas  throughout the remainder of the nondistended colon. No bowel wall thickening or evidence of bowel wall inflammation seen. Stomach is unremarkable, decompressed. Appendix is not seen but there are no inflammatory changes about the cecum to suggest acute appendicitis. Vascular/Lymphatic: Heavy atherosclerotic changes throughout the abdominal aorta and involving the superior mesenteric, inferior mesenteric and bilateral renal arteries. No acute appearing vascular abnormality. No enlarged lymph nodes appreciated within the abdomen or pelvis. No free intraperitoneal air. Reproductive: Prostate is unremarkable. Other: No free fluid or abscess collection. No free intraperitoneal air. Trace fluid stranding within the presacral space, likely related to the adjacent rectal vault impaction. Musculoskeletal: Degenerative changes throughout the slightly scoliotic thoracolumbar spine, mild to moderate in degree. No acute or suspicious osseous finding. Old rib fractures noted bilaterally. A minimally displaced fracture of the posterior left tenth rib may be subacute, not seen on the earlier CT. IMPRESSION: 1. Large stool ball distending the rectal vault. Fairly large amount of stool and gas throughout the remainder of the nondistended colon, compatible with the given history of constipation. 2. **An incidental finding of potential clinical significance has been found. Masslike density within the subcutaneous soft tissues of the lower left chest, most likely benign gynecomastia but incompletely imaged and new compared to the previous CT. A breast cancer cannot be excluded on these limited images. Recommend correlation with [facility]emergent [facility].** 3. Probable fatty infiltration of the liver. Possible liver cirrhosis. 4. Aortic atherosclerosis. Electronically Signed   By: Bary Richard M.D.   On: 05/05/2017 12:22    Assessment: 66 year old male with history of ETOH cirrhosis, admitted with constipation,  anorexia, found to be fecally impacted and heme positive with melanotic stool. EGD by Dr. 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 Recommend colonoscopy but he has continued to decline this as outpatient and inpatient.  Continues to drink alcohol.   Weight loss: he has lost approximately 20 lbs since last seen as an outpatient Sept 2017.   Constipation: improved   Plan: Continue Protonix BID Follow-up on biopsies Will follow-up as outpatient Anticipate discharge today ETOH cessation discussed with patient   Gelene Mink, PhD, ANP-BC [facility]    LOS: 2 days    05/07/2017, 8:04 AM

## 2017-05-07 NOTE — Care Management Obs Status (Signed)
MEDICARE OBSERVATION STATUS NOTIFICATION   Patient Details  Name: Billy BolkWalter L Goodness MRN: [id number] Date of Birth: 06/08/1951   Medicare Observation Status Notification Given:  Yes    Malcolm MetroChildress, Karisha Marlin Demske, RN 05/07/2017, 1:42 PM

## 2017-05-07 NOTE — Telephone Encounter (Signed)
PATIENT SCHEDULED  °

## 2017-05-07 NOTE — Evaluation (Addendum)
Occupational Therapy Evaluation Patient Details Name: Billy Stone MRN: [id number] DOB: 11-01-1950 Today's Date: 05/07/2017    History of Present Illness 66 y.o. male with history of alcoholism w/ cirrhosis, hyponatremia, splenectomy, CAD managed medically, chronic urinary retention, and HTN who presented w/ a 2 week hx of generalized weakness and diffuse abdom pain associated w/ constipation.    Clinical Impression   Pt on toilet upon therapy arrival. Reports that his appetite has not been there which has caused him to feel weak in the past 2 weeks. Pt states now his appetite has greatly improved and he is eating more although he feels his stomach has shrunk so he's not eating too much before he feels full. Patient is functioning close to Modified independent level for all ADL tasks. He does not feel he needs any additional OT services once discharged. All OT needs have been met at this time.     Follow Up Recommendations  No OT follow up    Equipment Recommendations  None recommended by OT       Precautions / Restrictions Precautions Precautions: Fall Precaution Comments: 2 falls in the past 6 months      Mobility Bed Mobility Overal bed mobility: Independent                Transfers Overall transfer level: Modified independent Equipment used: None                      ADL either performed or assessed with clinical judgement   ADL Overall ADL's : Needs assistance/impaired                 Upper Body Dressing : Set up;Sitting       Toilet Transfer: Supervision/safety;Ambulation;Regular Toilet;Grab bars   Toileting- Clothing Manipulation and Hygiene: Minimal assistance;Sit to/from stand               Vision Baseline Vision/History: No visual deficits Patient Visual Report: No change from baseline              Pertinent Vitals/Pain Pain Assessment: No/denies pain     Hand Dominance Right   Extremity/Trunk Assessment Upper  Extremity Assessment Upper Extremity Assessment: Overall WFL for tasks assessed   Lower Extremity Assessment Lower Extremity Assessment: Defer to PT evaluation       Communication Communication Communication: No difficulties   Cognition Arousal/Alertness: Awake/alert Behavior During Therapy: WFL for tasks assessed/performed Overall Cognitive Status: Within Functional Limits for tasks assessed                  Home Living Family/patient expects to be discharged to:: Private residence Living Arrangements: Other relatives (brother) Available Help at Discharge: Family;Available PRN/intermittently Type of Home: House Home Access: Stairs to enter [employer] of Steps: 3 Entrance Stairs-Rails: Left Home Layout: One level     Bathroom Shower/Tub: Teacher, early years/pre: Standard     Home Equipment: Cane - single point;Walker - 2 wheels          Prior Functioning/Environment Level of Independence: Independent        Comments: Brother does housekeeping and gets groceries         AM-PAC PT "6 Clicks" Daily Activity     Outcome Measure Help from another person eating meals?: None Help from another person taking care of personal grooming?: None Help from another person toileting, which includes using toliet, bedpan, or urinal?: None Help from another person bathing (including washing, rinsing,  drying)?: None Help from another person to put on and taking off regular upper body clothing?: None Help from another person to put on and taking off regular lower body clothing?: None 6 Click Score: 24   End of Session    Activity Tolerance: Patient tolerated treatment well Patient left: in bed;with call bell/phone within reach;with bed alarm set                   Time: 5834-6219 OT Time Calculation (min): 19 min Charges:  OT General Charges $OT Visit: 1 Visit OT Evaluation $OT Eval Low Complexity: 1 Low G-Codes:     Ailene Ravel,  OTR/L,CBIS  [phone]   05/07/17 0922  OT Time Calculation  OT Start Time (ACUTE ONLY) 0900  OT Stop Time (ACUTE ONLY) 0919  OT Time Calculation (min) 19 min  OT G-codes **NOT FOR INPATIENT CLASS**  Functional Assessment Tool Used Clinical judgement  Functional Limitation Self care  Self Care Current Status (W9090) CI  Self Care Goal Status (B0149) CI  Self Care Discharge Status (P6924) CI  OT General Charges  $OT Visit 1 Visit  OT Evaluation  $OT Eval Low Complexity 1 Low   Ailene Ravel, OTR/L,CBIS  [phone]  05/07/2017, 9:22 AM

## 2017-05-07 NOTE — Discharge Summary (Signed)
Physician Discharge Summary  Billy Stone MRN: [id number] DOB/AGE: Nov 21, 1950 66 y.o.  PCP: Celene Squibb, MD   Admit date: 05/05/2017 Discharge date: 05/07/2017  Discharge Diagnoses:    Active Problems:   Alcohol abuse   HYPONATREMIA, HX OF   Cirrhosis of liver (HCC)   Hypokalemia   GI bleed   Acute lower UTI   GIB (gastrointestinal bleeding)    Follow-up recommendations Follow-up with PCP in 3-5 days , including all  additional recommended appointments as below Follow-up CBC, CMP, magnesium in 3-5 days Recommend outpatient mammogram to be arranged for by primary care provider      Current Discharge Medication List    START taking these medications   Details  magnesium oxide (MAG-OX) 400 (241.3 Mg) MG tablet Take 1 tablet (400 mg total) by mouth 2 (two) times daily. Qty: 60 tablet, Refills: 3    pantoprazole (PROTONIX) 40 MG tablet Take 1 tablet (40 mg total) by mouth daily. Qty: 30 tablet, Refills: 1      CONTINUE these medications which have NOT CHANGED   Details  acetaminophen (TYLENOL) 500 MG tablet Take 500-1,000 mg by mouth every 6 (six) hours as needed for mild pain.    diphenhydrAMINE (BENADRYL) 25 mg capsule Take 25 mg by mouth daily as needed for allergies.     furosemide (LASIX) 20 MG tablet TAKE ONE TABLET BY MOUTH ONCE DAILY Qty: 90 tablet, Refills: 3    metoprolol (LOPRESSOR) 50 MG tablet TAKE ONE TABLET BY MOUTH TWICE DAILY Qty: 60 tablet, Refills: 11    Multiple Vitamin (MULTIVITAMIN WITH MINERALS) TABS tablet Take 1 tablet by mouth daily. Qty: 30 tablet, Refills: 0    nitroGLYCERIN (NITROSTAT) 0.4 MG SL tablet Place 1 tablet (0.4 mg total) under the tongue every 5 (five) minutes as needed for chest pain. Qty: 25 tablet, Refills: 3    spironolactone (ALDACTONE) 50 MG tablet TAKE ONE TABLET BY MOUTH ONCE DAILY Qty: 90 tablet, Refills: 3    vitamin B-12 (CYANOCOBALAMIN) 100 MCG tablet Take 100 mcg by mouth daily.      STOP taking these  medications     magnesium oxide (MAG-OX) 400 MG tablet      omeprazole (PRILOSEC) 20 MG capsule          Discharge Condition: Stable   Discharge Instructions Get Medicines reviewed and adjusted: Please take all your medications with you for your next visit with your Primary MD  Please request your Primary MD to go over all hospital tests and procedure/radiological results at the follow up, please ask your Primary MD to get all Hospital records sent to his/her office.  If you experience worsening of your admission symptoms, develop shortness of breath, life threatening emergency, suicidal or homicidal thoughts you must seek medical attention immediately by calling 911 or calling your MD immediately if symptoms less severe.  You must read complete instructions/literature along with all the possible adverse reactions/side effects for all the Medicines you take and that have been prescribed to you. Take any new Medicines after you have completely understood and accpet all the possible adverse reactions/side effects.   Do not drive when taking Pain medications.   Do not take more than prescribed Pain, Sleep and Anxiety Medications  Special Instructions: If you have smoked or chewed Tobacco in the last 2 yrs please stop smoking, stop any regular Alcohol and or any Recreational drug use.  Wear Seat belts while driving.  Please note  You were cared for by  a hospitalist during your hospital stay. Once you are discharged, your primary care physician will handle any further medical issues. Please note that NO REFILLS for any discharge medications will be authorized once you are discharged, as it is imperative that you return to your primary care physician (or establish a relationship with a primary care physician if you do not have one) for your aftercare needs so that they can reassess your need for medications and monitor your lab values.     Allergies  Allergen Reactions  .  Amlodipine Besylate     REACTION: Feet swell  . Penicillins Other (See Comments)    Childhood allergy Has patient had a PCN reaction causing immediate rash, facial/tongue/throat swelling, SOB or lightheadedness with hypotension: Unknown Has patient had a PCN reaction causing severe rash involving mucus membranes or skin necrosis: Unknown Has patient had a PCN reaction that required hospitalization: Unknown Has patient had a PCN reaction occurring within the last 10 years: No If all of the above answers are "NO", then may proceed with Cephalosporin use.       Disposition: 01-Home or Self Care   Consults:  GI     Significant Diagnostic Studies:  Dg Chest 2 View  Result Date: 05/05/2017 CLINICAL DATA:  Weakness and constipation, constipated for 2 weeks with lower abdominal pain. Weakness for 1 week. Difficulty sleeping in eating. EXAM: CHEST  2 VIEW COMPARISON:  Chest x-rays dated 03/27/2016 and 12/26/2014. FINDINGS: Heart size and mediastinal contours are stable. Atherosclerotic changes noted at the aortic arch. Questionable nodular density within the right upper lobe. Lungs otherwise clear. No pleural effusion or pneumothorax seen. Multiple old rib fractures again noted on the right. Mild degenerative spurring noted within the thoracic spine. No acute or suspicious osseous finding. IMPRESSION: 1. Questionable small nodular density within the right upper lobe, new compared to previous chest x-ray. This may represent superimposition of normal pulmonary vessels, however, developing neoplastic nodule cannot be confidently excluded. Recommend nonemergent chest CT for further characterization. 2. Lungs otherwise clear. No evidence of pneumonia or pulmonary edema. 3. Aortic atherosclerosis. Electronically Signed   By: Franki Cabot M.D.   On: 05/05/2017 13:15   Ct Chest Wo Contrast  Result Date: 05/06/2017 CLINICAL DATA:  Possible right upper lobe nodule on prior chest film. EXAM: CT CHEST  WITHOUT CONTRAST TECHNIQUE: Multidetector CT imaging of the chest was performed following the standard protocol without IV contrast. COMPARISON:  PA and lateral chest 05/05/2017, 03/27/2016 and 12/26/2014. CT chest 04/23/2014. FINDINGS: Cardiovascular: There is extensive calcific aortic and coronary atherosclerosis. Heart size is normal. No pericardial effusion. Mediastinum/Nodes: No enlarged mediastinal or axillary lymph nodes. Thyroid gland, trachea, and esophagus demonstrate no significant findings. Lungs/Pleura: No pleural effusion. Mild centrilobular emphysema is identified. There is no right upper lobe nodule or mass to correlate with finding on plain film. The patient does have sign linear scar in the anterior aspect of the right upper lobe which is unchanged since the prior chest CT. The lungs are otherwise clear. Upper Abdomen: The liver appears shrunken with a somewhat nodular border. No focal liver lesion is identified. A few stones are seen layering dependently in the gallbladder. Musculoskeletal: Gynecomastia is noted. No lytic or sclerotic bony lesion. Remote rib fractures are seen. IMPRESSION: Negative for pulmonary nodule.  No acute cardiopulmonary disease. Extensive calcific coronary atherosclerosis. The liver appears shrunken with a nodular border consistent with cirrhosis. Gynecomastia. Gallstones. Aortic Atherosclerosis (ICD10-I70.0) and Emphysema (ICD10-J43.9). Electronically Signed   By: Inge Rise  M.D.   On: 05/06/2017 20:38   Ct Abdomen Pelvis W Contrast  Result Date: 05/05/2017 CLINICAL DATA:  Pt with 2 weeks of constipation and weakness. Stool positive for blood. Hx of cirrhosis of the liver. Elevated WBC. Hx of splenectomy,CAD,HTN EXAM: CT ABDOMEN AND PELVIS WITH CONTRAST TECHNIQUE: Multidetector CT imaging of the abdomen and pelvis was performed using the standard protocol following bolus administration of intravenous contrast. CONTRAST:  148m ISOVUE-300 IOPAMIDOL (ISOVUE-300)  INJECTION 61% COMPARISON:  CT chest abdomen pelvis dated 04/23/2014. FINDINGS: Lower chest: No acute abnormality. Incompletely imaged density within the subcutaneous soft tissues of the lower left chest, presumed gynecomastia but new compared to the earlier chest CT of 04/23/2014. Hepatobiliary: Liver is low in density suggesting fatty infiltration. Liver contours are also slightly nodular throughout raising the possibility of underlying cirrhosis. No focal liver abnormality is seen. Gallbladder is unremarkable.  No bile duct dilatation seen. Pancreas: Unremarkable. No pancreatic ductal dilatation or surrounding inflammatory changes. Spleen: Absent Adrenals/Urinary Tract: Adrenal glands appear normal. Stable right renal cyst. No renal stone or hydronephrosis bilaterally. No perinephric fluid. Bladder is unremarkable, decompressed. Stomach/Bowel: Large stool ball distending the rectal vault. Fairly large amount of stool and gas throughout the remainder of the nondistended colon. No bowel wall thickening or evidence of bowel wall inflammation seen. Stomach is unremarkable, decompressed. Appendix is not seen but there are no inflammatory changes about the cecum to suggest acute appendicitis. Vascular/Lymphatic: Heavy atherosclerotic changes throughout the abdominal aorta and involving the superior mesenteric, inferior mesenteric and bilateral renal arteries. No acute appearing vascular abnormality. No enlarged lymph nodes appreciated within the abdomen or pelvis. No free intraperitoneal air. Reproductive: Prostate is unremarkable. Other: No free fluid or abscess collection. No free intraperitoneal air. Trace fluid stranding within the presacral space, likely related to the adjacent rectal vault impaction. Musculoskeletal: Degenerative changes throughout the slightly scoliotic thoracolumbar spine, mild to moderate in degree. No acute or suspicious osseous finding. Old rib fractures noted bilaterally. A minimally  displaced fracture of the posterior left tenth rib may be subacute, not seen on the earlier CT. IMPRESSION: 1. Large stool ball distending the rectal vault. Fairly large amount of stool and gas throughout the remainder of the nondistended colon, compatible with the given history of constipation. 2. **An incidental finding of potential clinical significance has been found. Masslike density within the subcutaneous soft tissues of the lower left chest, most likely benign gynecomastia but incompletely imaged and new compared to the previous CT. A breast cancer cannot be excluded on these limited images. Recommend correlation with nonemergent mammogram at the [facility]** 3. Probable fatty infiltration of the liver. Possible liver cirrhosis. 4. Aortic atherosclerosis. Electronically Signed   By: SFranki CabotM.D.   On: 05/05/2017 12:22        Filed Weights   05/05/17 0907 05/05/17 1819  Weight: 79.4 kg (175 lb) 68.7 kg (151 lb 8 oz)     Microbiology: Recent Results (from the past 240 hour(s))  Culture, blood (Routine X 2) w Reflex to ID Panel     Status: None (Preliminary result)   Collection Time: 05/05/17 10:40 AM  Result Value Ref Range Status   Specimen Description RIGHT ANTECUBITAL  Final   Special Requests   Final    BOTTLES DRAWN AEROBIC AND ANAEROBIC Blood Culture adequate volume   Culture NO GROWTH 1 DAY  Final   Report Status PENDING  Incomplete  Culture, blood (Routine X 2) w Reflex to ID Panel  Status: None (Preliminary result)   Collection Time: 05/05/17 10:40 AM  Result Value Ref Range Status   Specimen Description BLOOD RIGHT WRIST  Final   Special Requests   Final    BOTTLES DRAWN AEROBIC ONLY Blood Culture results may not be optimal due to an inadequate volume of blood received in culture bottles   Culture NO GROWTH 1 DAY  Final   Report Status PENDING  Incomplete       Blood Culture    Component Value Date/Time   SDES RIGHT ANTECUBITAL 05/05/2017 1040   SDES  BLOOD RIGHT WRIST 05/05/2017 1040   SPECREQUEST  05/05/2017 1040    BOTTLES DRAWN AEROBIC AND ANAEROBIC Blood Culture adequate volume   SPECREQUEST  05/05/2017 1040    BOTTLES DRAWN AEROBIC ONLY Blood Culture results may not be optimal due to an inadequate volume of blood received in culture bottles   CULT NO GROWTH 1 DAY 05/05/2017 1040   CULT NO GROWTH 1 DAY 05/05/2017 1040   REPTSTATUS PENDING 05/05/2017 1040   REPTSTATUS PENDING 05/05/2017 1040      Labs: Results for orders placed or performed during the hospital encounter of 05/05/17 (from the past 48 hour(s))  POC occult blood, ED     Status: Abnormal   Collection Time: 05/05/17  9:24 AM  Result Value Ref Range   Fecal Occult Bld POSITIVE (A) NEGATIVE  Urinalysis, Routine w reflex microscopic     Status: Abnormal   Collection Time: 05/05/17  9:26 AM  Result Value Ref Range   Color, Urine AMBER (A) YELLOW    Comment: BIOCHEMICALS MAY BE AFFECTED BY COLOR   APPearance CLOUDY (A) CLEAR   Specific Gravity, Urine 1.015 1.005 - 1.030   pH 5.0 5.0 - 8.0   Glucose, UA NEGATIVE NEGATIVE mg/dL   Hgb urine dipstick NEGATIVE NEGATIVE   Bilirubin Urine NEGATIVE NEGATIVE   Ketones, ur NEGATIVE NEGATIVE mg/dL   Protein, ur 30 (A) NEGATIVE mg/dL   Nitrite NEGATIVE NEGATIVE   Leukocytes, UA LARGE (A) NEGATIVE   RBC / HPF 0-5 0 - 5 RBC/hpf   WBC, UA TOO NUMEROUS TO COUNT 0 - 5 WBC/hpf   Bacteria, UA MANY (A) NONE SEEN   Squamous Epithelial / LPF 0-5 (A) NONE SEEN   WBC Clumps PRESENT    Mucus PRESENT    Hyaline Casts, UA PRESENT   Comprehensive metabolic panel     Status: Abnormal   Collection Time: 05/05/17  9:31 AM  Result Value Ref Range   Sodium 124 (L) 135 - 145 mmol/L   Potassium 3.0 (L) 3.5 - 5.1 mmol/L   Chloride 84 (L) 101 - 111 mmol/L   CO2 26 22 - 32 mmol/L   Glucose, Bld 132 (H) 65 - 99 mg/dL   BUN 26 (H) 6 - 20 mg/dL   Creatinine, Ser 0.82 0.61 - 1.24 mg/dL   Calcium 9.1 8.9 - 10.3 mg/dL   Total Protein 8.0 6.5 -  8.1 g/dL   Albumin 3.2 (L) 3.5 - 5.0 g/dL   AST 41 15 - 41 U/L   ALT 19 17 - 63 U/L   Alkaline Phosphatase 77 38 - 126 U/L   Total Bilirubin 2.1 (H) 0.3 - 1.2 mg/dL   GFR calc non Af Amer >60 >60 mL/min   GFR calc Af Amer >60 >60 mL/min    Comment: (NOTE) The eGFR has been calculated using the CKD EPI equation. This calculation has not been validated in all clinical situations. eGFR's  persistently <60 mL/min signify possible Chronic Kidney Disease.    Anion gap 14 5 - 15  Lipase, blood     Status: Abnormal   Collection Time: 05/05/17  9:31 AM  Result Value Ref Range   Lipase 54 (H) 11 - 51 U/L  Troponin I     Status: None   Collection Time: 05/05/17  9:31 AM  Result Value Ref Range   Troponin I <0.03 <0.03 ng/mL  CBC with Differential     Status: Abnormal   Collection Time: 05/05/17  9:31 AM  Result Value Ref Range   WBC 13.7 (H) 4.0 - 10.5 K/uL   RBC 3.60 (L) 4.22 - 5.81 MIL/uL   Hemoglobin 13.5 13.0 - 17.0 g/dL   HCT 38.4 (L) 39.0 - 52.0 %   MCV 106.7 (H) 78.0 - 100.0 fL   MCH 37.5 (H) 26.0 - 34.0 pg   MCHC 35.2 30.0 - 36.0 g/dL   RDW 13.4 11.5 - 15.5 %   Platelets 400 150 - 400 K/uL   Neutrophils Relative % 58 %   Lymphocytes Relative 28 %   Monocytes Relative 13 %   Eosinophils Relative 0 %   Basophils Relative 1 %   Neutro Abs 8.0 (H) 1.7 - 7.7 K/uL   Lymphs Abs 3.8 0.7 - 4.0 K/uL   Monocytes Absolute 1.8 (H) 0.1 - 1.0 K/uL   Eosinophils Absolute 0.0 0.0 - 0.7 K/uL   Basophils Absolute 0.1 0.0 - 0.1 K/uL  Protime-INR     Status: None   Collection Time: 05/05/17  9:31 AM  Result Value Ref Range   Prothrombin Time 14.2 11.4 - 15.2 seconds   INR 1.11   Culture, blood (Routine X 2) w Reflex to ID Panel     Status: None (Preliminary result)   Collection Time: 05/05/17 10:40 AM  Result Value Ref Range   Specimen Description RIGHT ANTECUBITAL    Special Requests      BOTTLES DRAWN AEROBIC AND ANAEROBIC Blood Culture adequate volume   Culture NO GROWTH 1 DAY     Report Status PENDING   Culture, blood (Routine X 2) w Reflex to ID Panel     Status: None (Preliminary result)   Collection Time: 05/05/17 10:40 AM  Result Value Ref Range   Specimen Description BLOOD RIGHT WRIST    Special Requests      BOTTLES DRAWN AEROBIC ONLY Blood Culture results may not be optimal due to an inadequate volume of blood received in culture bottles   Culture NO GROWTH 1 DAY    Report Status PENDING   Magnesium     Status: Abnormal   Collection Time: 05/06/17  6:53 AM  Result Value Ref Range   Magnesium 1.4 (L) 1.7 - 2.4 mg/dL  Comprehensive metabolic panel     Status: Abnormal   Collection Time: 05/06/17  6:53 AM  Result Value Ref Range   Sodium 131 (L) 135 - 145 mmol/L    Comment: DELTA CHECK NOTED   Potassium 2.9 (L) 3.5 - 5.1 mmol/L   Chloride 97 (L) 101 - 111 mmol/L   CO2 24 22 - 32 mmol/L   Glucose, Bld 109 (H) 65 - 99 mg/dL   BUN 12 6 - 20 mg/dL   Creatinine, Ser 0.49 (L) 0.61 - 1.24 mg/dL   Calcium 8.6 (L) 8.9 - 10.3 mg/dL   Total Protein 6.4 (L) 6.5 - 8.1 g/dL   Albumin 2.6 (L) 3.5 - 5.0 g/dL   AST 24  15 - 41 U/L   ALT 12 (L) 17 - 63 U/L   Alkaline Phosphatase 69 38 - 126 U/L   Total Bilirubin 1.6 (H) 0.3 - 1.2 mg/dL   GFR calc non Af Amer >60 >60 mL/min   GFR calc Af Amer >60 >60 mL/min    Comment: (NOTE) The eGFR has been calculated using the CKD EPI equation. This calculation has not been validated in all clinical situations. eGFR's persistently <60 mL/min signify possible Chronic Kidney Disease.    Anion gap 10 5 - 15  CBC     Status: Abnormal   Collection Time: 05/06/17  6:53 AM  Result Value Ref Range   WBC 10.3 4.0 - 10.5 K/uL   RBC 3.11 (L) 4.22 - 5.81 MIL/uL   Hemoglobin 11.7 (L) 13.0 - 17.0 g/dL   HCT 33.7 (L) 39.0 - 52.0 %   MCV 108.4 (H) 78.0 - 100.0 fL   MCH 37.6 (H) 26.0 - 34.0 pg   MCHC 34.7 30.0 - 36.0 g/dL   RDW 13.6 11.5 - 15.5 %   Platelets 400 150 - 400 K/uL  CBC     Status: Abnormal   Collection Time: 05/07/17   6:10 AM  Result Value Ref Range   WBC 11.4 (H) 4.0 - 10.5 K/uL   RBC 3.15 (L) 4.22 - 5.81 MIL/uL   Hemoglobin 11.7 (L) 13.0 - 17.0 g/dL   HCT 34.8 (L) 39.0 - 52.0 %   MCV 110.5 (H) 78.0 - 100.0 fL   MCH 37.1 (H) 26.0 - 34.0 pg   MCHC 33.6 30.0 - 36.0 g/dL   RDW 13.6 11.5 - 15.5 %   Platelets 442 (H) 150 - 400 K/uL  Basic metabolic panel     Status: Abnormal   Collection Time: 05/07/17  6:10 AM  Result Value Ref Range   Sodium 129 (L) 135 - 145 mmol/L   Potassium 4.0 3.5 - 5.1 mmol/L    Comment: DELTA CHECK NOTED   Chloride 100 (L) 101 - 111 mmol/L   CO2 22 22 - 32 mmol/L   Glucose, Bld 106 (H) 65 - 99 mg/dL   BUN 5 (L) 6 - 20 mg/dL   Creatinine, Ser 0.44 (L) 0.61 - 1.24 mg/dL   Calcium 8.6 (L) 8.9 - 10.3 mg/dL   GFR calc non Af Amer >60 >60 mL/min   GFR calc Af Amer >60 >60 mL/min    Comment: (NOTE) The eGFR has been calculated using the CKD EPI equation. This calculation has not been validated in all clinical situations. eGFR's persistently <60 mL/min signify possible Chronic Kidney Disease.    Anion gap 7 5 - 15  Reticulocytes     Status: Abnormal   Collection Time: 05/07/17  6:10 AM  Result Value Ref Range   Retic Ct Pct 2.6 0.4 - 3.1 %   RBC. 3.15 (L) 4.22 - 5.81 MIL/uL   Retic Count, Absolute 81.9 19.0 - 186.0 K/uL  Magnesium     Status: Abnormal   Collection Time: 05/07/17  6:10 AM  Result Value Ref Range   Magnesium 1.4 (L) 1.7 - 2.4 mg/dL  Phosphorus     Status: Abnormal   Collection Time: 05/07/17  6:10 AM  Result Value Ref Range   Phosphorus 1.8 (L) 2.5 - 4.6 mg/dL     Lipid Panel     Component Value Date/Time   CHOL 126 09/30/2009 0000   TRIG 198 (H) 09/30/2009 0000   HDL 44 09/30/2009 0000  CHOLHDL 2.9 Ratio 09/30/2009 0000   VLDL 40 09/30/2009 0000   LDLCALC 42 09/30/2009 0000     Lab Results  Component Value Date   HGBA1C 5.9 (H) 12/27/2014   HGBA1C 5.4 04/23/2014   HGBA1C 5.9 (H) 03/04/2010     Lab Results  Component Value Date    LDLCALC 42 09/30/2009   CREATININE 0.44 (L) 05/07/2017     HPI :   66 y.o. male with history of alcoholism w/ cirrhosis, hyponatremia, splenectomy, CAD managed medically, chronic urinary retention, and HTN who presented w/ a 2 week hx of generalized weakness and diffuse abdom pain associated w/ constipation.    In the ED the pt was found to be suffering w/ a significant fecal impaction.  After he was manually disimpacted, he began to pass melanotic stool.  GI was consulted by the ED Team.    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, but I doubt the accuracy of this hx as his Na is climbing w/ simple volume expansion -apparently baseline sodium is probably around 129 to 130, exacerbated by alcohol use, recommended minimizing alcohol use   Hypokalemia Persists - due to poor intake and prob resultant signif total body deficit - cont to supplement and follow   Hypomagnesemia supplement Mg and follow   Macrocytic anemia  Likely due to toxic effect of EtOH on bone marrow - Q73 and folic acid pending Patient is on sublingual vitamin B-12 at home, as per history   Alcoholic cirrhosis of the liver - alcoholism Admits to drinking variable  quantities of alcohol on a daily basis, to different providers   Multivessel coronary artery disease Asymptomatic at this time   Bicuspid aortic valve Noted per Dr. Domenic Polite - being followed - no evidence of hemodynamic significance at this time - M appreciable on exam   RUL lung nodule  Not seen on CT scan, has centrilobular emphysema  Masslike density within the subcutaneous soft tissues of the lower left  chest Per Radiology "most likely benign gynecomastia...breast cancer cannot be excluded...nonemergent mammogram at the [facility]" recommended - noted to have gynecomastia on CT, recommend outpatient mammogram  HTN BP currently well controlled   Discharge Exam:   Blood pressure 117/70, pulse 70, temperature 97.8 F (36.6 C), temperature source Oral, resp. rate 18, height _0  (1.651 m), weight 68.7 kg (151 lb 8 oz), SpO2 100 %.   General: No acute respiratory distress - alert and oriented  Lungs: Clear to auscultation bilaterally without wheezes or crackles Cardiovascular: Regular rate and rhythm w/ 2/6 holosystolic M Abdomen: Nontender, nondistended, soft, bowel sounds positive, no rebound, no ascites, no appreciable mass Extremities: No significant edema bilateral lower extremities   Follow-up Information    Celene Squibb, MD. Call.   Specialty:  Internal Medicine Why:  Hospital follow-up in 3-5 days Contact information: [place] [id number] [phone]           Signed: Reyne Dumas 05/07/2017, 9:06 AM        Time spent >1 hour

## 2017-05-07 NOTE — Progress Notes (Signed)
Pt discharged in stable condition into the care of his brother via wheelchair via private vehicle.  PIV removed intact w/o S&S of complications.  Discharge instructions reviewed with pt.  Pt verbalized understanding.

## 2017-05-07 NOTE — Care Management CC44 (Signed)
Condition Code 44 Documentation Completed  Patient Details  Name: Billy Stone MRN: [id number] Date of Birth: 06/17/1951   Condition Code 44 given:  Yes Patient signature on Condition Code 44 notice:  Yes Documentation of 2 MD's agreement:  Yes Code 44 added to claim:  Yes    Malcolm Metrohildress, Dyllen Menning Demske, RN 05/07/2017, 1:42 PM

## 2017-05-07 NOTE — Care Management Note (Signed)
Case Management Note  Patient Details  Name: Billy Stone MRN: [id number] Date of Birth: Nov 01, 1950  Subjective/Objective:                  Admitted with GIB. Pt from home, ind, drives, very active, not homebound. Pt has PCP, transportation and insurance with drug coverage. PT has recommended  HHPT. Pt declines HH. No needs/concerns communicated.   Action/Plan: DC home today.   Expected Discharge Date:  05/07/17               Expected Discharge Plan:  Home/Self Care  In-House Referral:  NA  Discharge planning Services  CM Consult  Post Acute Care Choice:  NA Choice offered to:  NA  Status of Service:  Completed, signed off   Malcolm MetroChildress, Shah Insley Demske, RN 05/07/2017, 12:14 PM

## 2017-05-07 NOTE — Progress Notes (Signed)
Initial Nutrition Assessment  DOCUMENTATION CODES:   Severe malnutrition in context of social or environmental circumstances  INTERVENTION:  - Continue Boost Breeze po TID, each supplement provides 250 kcal and 9 grams of protein - Offer Ensure Enlive po BID, each supplement provides 350 kcal and 20 grams of protein  NUTRITION DIAGNOSIS:   Malnutrition (severe ) related to social / environmental circumstances (alcoholism-cirrhosis of the liver ) as evidenced by moderate depletions of muscle mass, severe depletion of muscle mass, percent weight loss (12% in 3 months).  GOAL:   Patient will meet greater than or equal to 90% of their needs  MONITOR:   PO intake, Supplement acceptance, Weight trends, I & O's, Labs  REASON FOR ASSESSMENT:   Malnutrition Screening Tool    ASSESSMENT:   Pt with PMH of alcoholism with cirrhosis to the liver presents with constipation and poor po intake, found to have a GI bleed  Pt reports significant weight loss. Pt reports a UBW of 170 lb, reporting he weighed this 2-3 months ago. Pt is currently 151 lb, 12% weight loss in 2-3 months, significant for time frame. Per chart pt was ~170 lbs for over a year.  Pt reports poor appetite that worsened over the past 3 weeks. Pt reports consuming nutritional supplements at baseline and little snacks. Pt reports a slight increase in appetite since admission. Per chart review pt consumed 100% of breakfast this morning.   Pt reports enjoying the Boost Breeze juice boxes but enjoys Ensure at baseline so would like to alternate until discharge.   Nutrition focused physical exam completed. Findings include moderate to severe muscle wasting, no fat depletion and no edema.  Labs reviewed; Na 129, Phosphorus 1.8, Magnesium 1.4 Medications reviewed; Magnesium oxide, multivitamin, Protonix, Senokot, Vitamin B 12, IV 75 mL/hr NaCl with KCl, IV magnesium sulfate  Diet Order:  Diet Heart Room service appropriate? Yes;  Fluid consistency: Thin Diet - low sodium heart healthy  Skin:  Reviewed, no issues  Last BM:  05/06/17  Height:   Ht Readings from Last 1 Encounters:  05/05/17 5\' 5"  (1.651 m)    Weight:   Wt Readings from Last 1 Encounters:  05/05/17 151 lb 8 oz (68.7 kg)    Ideal Body Weight:  61.8 kg  BMI:  Body mass index is 25.21 kg/m.  Estimated Nutritional Needs:   Kcal:  2050-2250  Protein:  100-110 grams  Fluid:  >/= 2 L/d  EDUCATION NEEDS:   Education needs no appropriate at this time   Billy Kaufmannllison Ioannides, MS, RDN, LDN 05/07/2017 12:00 PM

## 2017-05-07 NOTE — Telephone Encounter (Signed)
Please have patient return in 6-8 weeks for hospital follow-up.

## 2017-05-07 NOTE — Care Management Important Message (Signed)
Important Message  Patient Details  Name: Billy Stone MRN: [id number] Date of Birth: 01/21/1951   Medicare Important Message Given:  Yes    Malcolm MetroChildress, Stefhanie Kachmar Demske, RN 05/07/2017, 12:20 PM

## 2017-05-07 NOTE — Evaluation (Addendum)
Physical Therapy Evaluation Patient Details Name: Billy Stone MRN: [id number] DOB: 07/06/1951 Today's Date: 05/07/2017   History of Present Illness  66 y.o. male with history of alcoholism w/ cirrhosis, hyponatremia, splenectomy, CAD managed medically, chronic urinary retention, and HTN who presented w/ a 2 week hx of generalized weakness and diffuse abdom pain associated w/ constipation.      Clinical Impression   Patient functioning near baseline and demonstrates slightly labored ataxic like gait which is baseline per  Patient.  Patient will benefit from continued physical therapy in hospital and recommended venue below to increase strength, balance, endurance for safe ADLs and gait.   Follow Up Recommendations Home health PT;Supervision - Intermittent    Equipment Recommendations  None recommended by PT    Recommendations for Other Services       Precautions / Restrictions Precautions Precautions: Fall Precaution Comments: 2 falls in the past 6 months Restrictions Weight Bearing Restrictions: No      Mobility  Bed Mobility Overal bed mobility: Independent                Transfers Overall transfer level: Needs assistance Equipment used: Straight cane Transfers: Sit to/from Stand Sit to Stand: Supervision            Ambulation/Gait Ambulation/Gait assistance: Supervision Ambulation Distance (Feet): 90 Feet Assistive device: Straight cane Gait Pattern/deviations: Decreased step length - right;Decreased step length - left;Ataxic;Decreased stride length   Gait velocity interpretation: Below normal speed for age/gender General Gait Details: ambulates with wide base of support  Stairs            Wheelchair Mobility    Modified Rankin (Stroke Patients Only)       Balance Overall balance assessment: Needs assistance Sitting-balance support: No upper extremity supported;Feet supported Sitting balance-Leahy Scale: Good     Standing balance  support: No upper extremity supported Standing balance-Leahy Scale: Fair                               Pertinent Vitals/Pain Pain Assessment: No/denies pain    Home Living Family/patient expects to be discharged to:: Private residence Living Arrangements: Spouse/significant other (lives with brother) Available Help at Discharge: Family;Available PRN/intermittently Type of Home: House Home Access: Stairs to enter Entrance Stairs-Rails: Left Entrance Stairs-Number of Steps: 3 Home Layout: One level Home Equipment: Cane - single point;Walker - 2 wheels Additional Comments: Patient's SPC in his room    Prior Function Level of Independence: Independent         Comments: Brother does housekeeping and gets groceries     Hand Dominance   Dominant Hand: Right    Extremity/Trunk Assessment   Upper Extremity Assessment Upper Extremity Assessment: Defer to OT evaluation;Overall [facility] for tasks assessed    Lower Extremity Assessment Lower Extremity Assessment: Overall WFL for tasks assessed    Cervical / Trunk Assessment Cervical / Trunk Assessment: Normal  Communication   Communication: No difficulties  Cognition Arousal/Alertness: Awake/alert Behavior During Therapy: WFL for tasks assessed/performed Overall Cognitive Status: Within Functional Limits for tasks assessed                                        General Comments      Exercises     Assessment/Plan    PT Assessment Patient needs continued PT services  PT Problem List  Decreased strength;Decreased activity tolerance;Decreased mobility;Decreased balance       PT Treatment Interventions Gait training;Functional mobility training;Therapeutic activities;Therapeutic exercise;Patient/family education    PT Goals (Current goals can be found in the Care Plan section)  Acute Rehab PT Goals Patient Stated Goal: return home with brother to assist Time For Goal Achievement:  05/11/17 Potential to Achieve Goals: Good    Frequency Min 3X/week   Barriers to discharge        Co-evaluation               AM-PAC PT "6 Clicks" Daily Activity  Outcome Measure Difficulty turning over in bed (including adjusting bedclothes, sheets and blankets)?: None Difficulty moving from lying on back to sitting on the side of the bed? : None Difficulty sitting down on and standing up from a chair with arms (e.g., wheelchair, bedside commode, etc,.)?: None Help needed moving to and from a bed to chair (including a wheelchair)?: A Little Help needed walking in hospital room?: A Little Help needed climbing 3-5 steps with a railing? : A Little 6 Click Score: 21    End of Session Equipment Utilized During Treatment: Gait belt Activity Tolerance: Patient tolerated treatment well  Nurse Communication: Mobility status PT Visit Diagnosis: Unsteadiness on feet (R26.81);Other abnormalities of gait and mobility (R26.89);Muscle weakness (generalized) (M62.81)    Time: [id number] PT Time Calculation (min) (ACUTE ONLY): 32 min   Charges:   PT Evaluation $PT Eval Low Complexity: 1 Low PT Treatments $Gait Training: 23-37 mins          11:27 AM, 05/07/17 Ocie BobJames Renee Beale, MPT Physical Therapist with [facility] 336 [phone] office [phone] mobile phone   PT G Codes:   06/14/17 1317  PT G-Codes **NOT FOR INPATIENT CLASS**  Functional Assessment Tool Used AM-PAC 6 Clicks Basic Mobility  Functional Limitation Mobility: Walking and moving around  Mobility: Walking and Moving Around Current Status [phone](G8978) CJ  Mobility: Walking and Moving Around Goal Status ([id number](G8979) CJ  Mobility: Walking and Moving Around Discharge Status [phone](G8980) CJ   Addendum on 06/14/17 to include G Codes of findings from evaluation on 05/07/17  1:25 PM, 06/14/17 Ocie BobJames Brennon Otterness, MPT Physical Therapist with [facility] 336 [phone] office [phone] mobile phone

## 2017-05-08 LAB — URINE CULTURE: Special Requests: NORMAL

## 2017-05-10 LAB — CULTURE, BLOOD (ROUTINE X 2)
CULTURE: NO GROWTH
Culture: NO GROWTH
SPECIAL REQUESTS: ADEQUATE

## 2017-05-14 ENCOUNTER — Encounter (HOSPITAL_COMMUNITY): Payer: Self-pay | Admitting: Emergency Medicine

## 2017-05-14 ENCOUNTER — Telehealth: Payer: Self-pay

## 2017-05-14 ENCOUNTER — Emergency Department (HOSPITAL_COMMUNITY)
Admission: EM | Admit: 2017-05-14 | Discharge: 2017-05-14 | Disposition: A | Discharge status: Home / Self Care | Payer: Medicare Other | Attending: Emergency Medicine | Admitting: Emergency Medicine

## 2017-05-14 ENCOUNTER — Emergency Department (HOSPITAL_COMMUNITY): Payer: Medicare Other

## 2017-05-14 DIAGNOSIS — Z87891 Personal history of nicotine dependence: Secondary | ICD-10-CM | POA: Insufficient documentation

## 2017-05-14 DIAGNOSIS — I119 Hypertensive heart disease without heart failure: Secondary | ICD-10-CM | POA: Insufficient documentation

## 2017-05-14 DIAGNOSIS — K5900 Constipation, unspecified: Secondary | ICD-10-CM | POA: Insufficient documentation

## 2017-05-14 DIAGNOSIS — I2510 Atherosclerotic heart disease of native coronary artery without angina pectoris: Secondary | ICD-10-CM | POA: Diagnosis not present

## 2017-05-14 DIAGNOSIS — R1900 Intra-abdominal and pelvic swelling, mass and lump, unspecified site: Secondary | ICD-10-CM | POA: Diagnosis not present

## 2017-05-14 DIAGNOSIS — M7989 Other specified soft tissue disorders: Secondary | ICD-10-CM | POA: Diagnosis not present

## 2017-05-14 DIAGNOSIS — Z79899 Other long term (current) drug therapy: Secondary | ICD-10-CM | POA: Diagnosis not present

## 2017-05-14 DIAGNOSIS — R109 Unspecified abdominal pain: Secondary | ICD-10-CM | POA: Diagnosis not present

## 2017-05-14 LAB — COMPREHENSIVE METABOLIC PANEL
ALT: 23 U/L (ref 17–63)
ANION GAP: 11 (ref 5–15)
AST: 35 U/L (ref 15–41)
Albumin: 2.9 g/dL — ABNORMAL LOW (ref 3.5–5.0)
Alkaline Phosphatase: 89 U/L (ref 38–126)
BILIRUBIN TOTAL: 0.6 mg/dL (ref 0.3–1.2)
BUN: 7 mg/dL (ref 6–20)
CO2: 23 mmol/L (ref 22–32)
Calcium: 8.5 mg/dL — ABNORMAL LOW (ref 8.9–10.3)
Chloride: 97 mmol/L — ABNORMAL LOW (ref 101–111)
Creatinine, Ser: 0.53 mg/dL — ABNORMAL LOW (ref 0.61–1.24)
GFR calc Af Amer: >60 mL/min (ref >60)
Glucose, Bld: 103 mg/dL — ABNORMAL HIGH (ref 65–99)
POTASSIUM: 4.2 mmol/L (ref 3.5–5.1)
Sodium: 131 mmol/L — ABNORMAL LOW (ref 135–145)
TOTAL PROTEIN: 7 g/dL (ref 6.5–8.1)

## 2017-05-14 LAB — CBC WITH DIFFERENTIAL/PLATELET
BASOS ABS: 0.1 10*3/uL (ref 0.0–0.1)
BASOS PCT: 1 %
EOS ABS: 0.2 10*3/uL (ref 0.0–0.7)
Eosinophils Relative: 2 %
HEMATOCRIT: 33.2 % — AB (ref 39.0–52.0)
Hemoglobin: 11.3 g/dL — ABNORMAL LOW (ref 13.0–17.0)
Lymphocytes Relative: 43 %
Lymphs Abs: 4.6 10*3/uL — ABNORMAL HIGH (ref 0.7–4.0)
MCH: 36.8 pg — ABNORMAL HIGH (ref 26.0–34.0)
MCHC: 34 g/dL (ref 30.0–36.0)
MCV: 108.1 fL — AB (ref 78.0–100.0)
Monocytes Absolute: 1.2 10*3/uL — ABNORMAL HIGH (ref 0.1–1.0)
Monocytes Relative: 11 %
Neutro Abs: 4.5 10*3/uL (ref 1.7–7.7)
Neutrophils Relative %: 43 %
Platelets: 363 10*3/uL (ref 150–400)
RBC: 3.07 MIL/uL — ABNORMAL LOW (ref 4.22–5.81)
RDW: 13.6 % (ref 11.5–15.5)
WBC: 10.6 10*3/uL — AB (ref 4.0–10.5)

## 2017-05-14 IMAGING — DX DG ABDOMEN 1V
1 series · 1 of 1 positions shown · non-contrast
Comparison: Abdominal CT 9 days ago

CLINICAL DATA: Pain and constipation.  Swelling.

EXAM:
ABDOMEN - 1 VIEW

[abdomen kub]
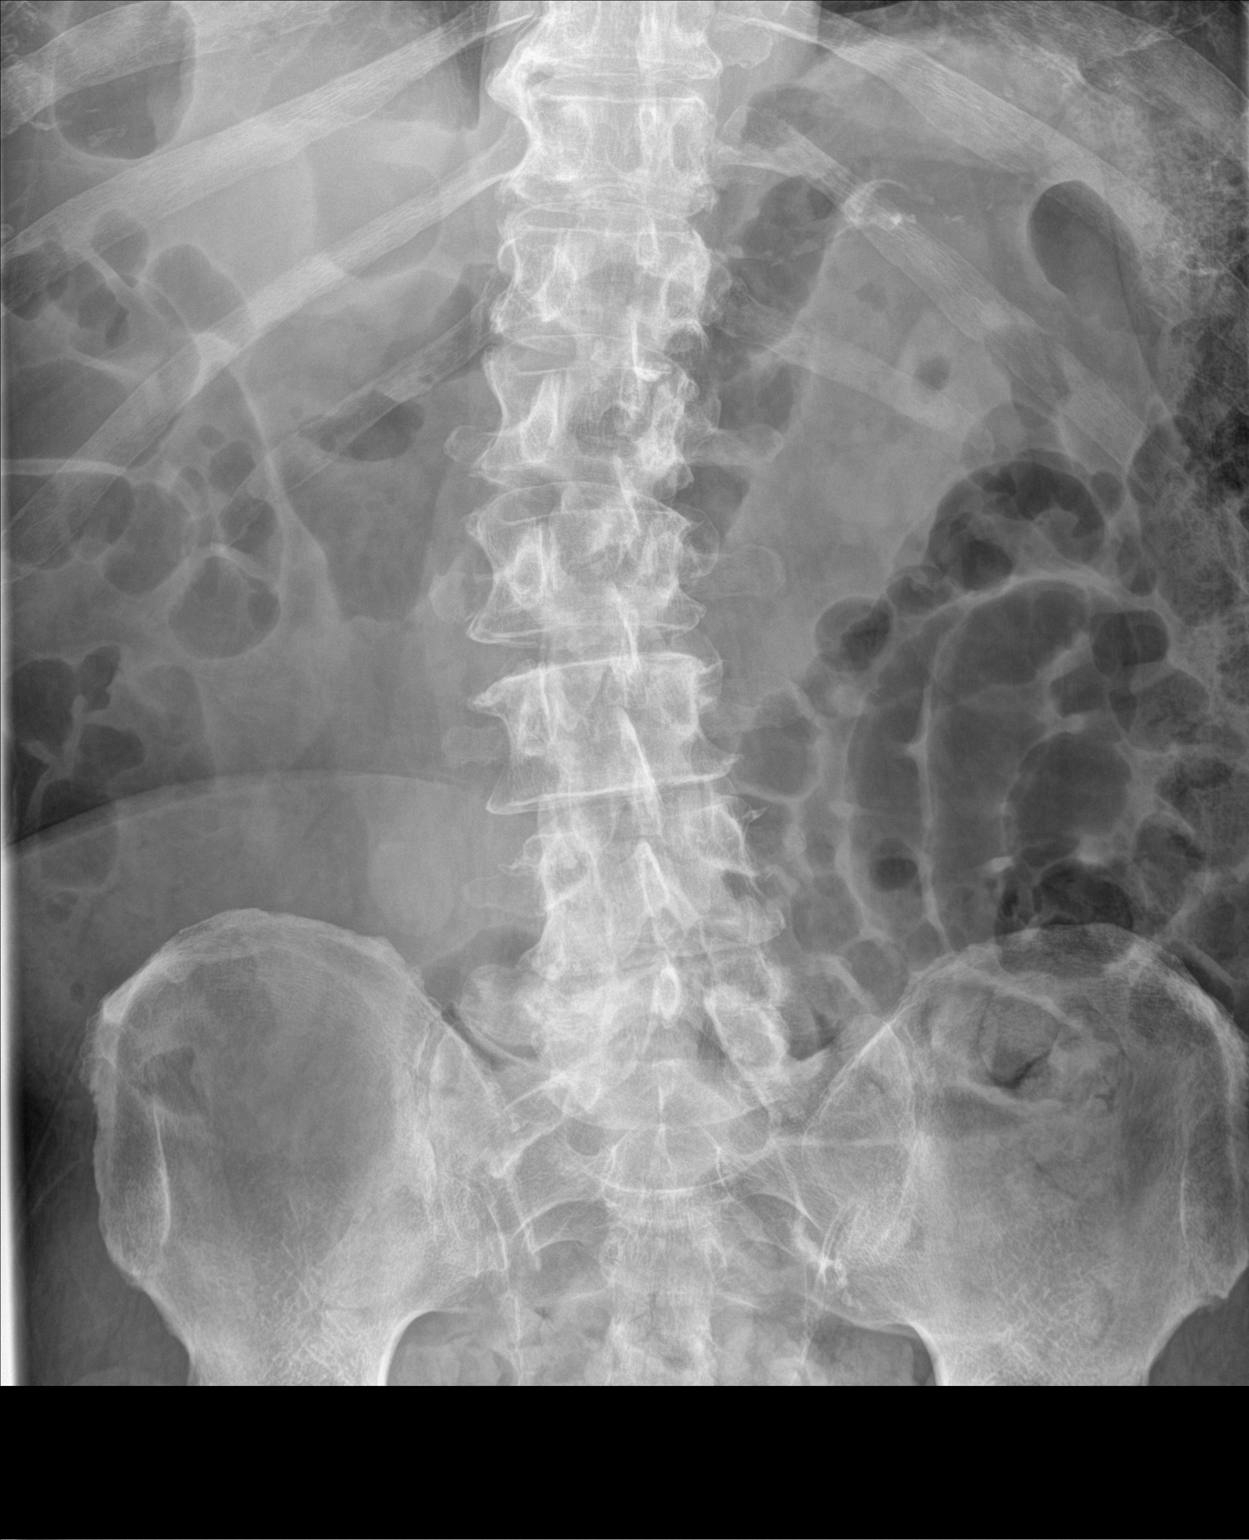

[1 of 1 positions shown; findings below may reference images not displayed]

FINDINGS: Normal bowel gas pattern. Distal colonic stool burden has
significantly decreased. No concerning mass effect or gas
collection. Atherosclerotic calcifications are noted. There is
lumbar spondylosis with dextroscoliosis.
IMPRESSION: Nonobstructive bowel gas pattern. Stool retention has normalized
since [DATE] comparison.

## 2017-05-14 MED ORDER — POLYETHYLENE GLYCOL 3350 17 G PO PACK: 17.0000 g | PACK | Freq: Every day | ORAL | 0 refills | Status: DC

## 2017-05-14 NOTE — Telephone Encounter (Signed)
Pt is calling to see about his fluid pills. He said that the hospital did not tell him to keep taking them. I looked at his AVS and it is on the to take Lasix 20 mg daily and Spirononlactone 50 mg daily. I read that to him and ask AB about it and she said yes. He said that he will start taking it again.
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
## 2017-05-14 NOTE — ED Triage Notes (Signed)
Patient complains of constipation and bilateral foot edema. Patient states he had very small BM yesterday.

## 2017-05-14 NOTE — ED Notes (Addendum)
Pt with bilateral ankle swelling for past 2 days per pt, pt states has been taking dulcolax for constipation and LBM yesterday but very small per pt

## 2017-05-14 NOTE — ED Notes (Signed)
Pt returned from xray

## 2017-05-14 NOTE — ED Notes (Signed)
Patient transported to X-ray 

## 2017-05-23 ENCOUNTER — Other Ambulatory Visit: Payer: Self-pay | Admitting: Gastroenterology

## 2017-05-23 ENCOUNTER — Telehealth: Payer: Self-pay

## 2017-05-23 NOTE — Telephone Encounter (Signed)
Pt is wanting to have a Rx for Lasix sent into [facility] in [place].

## 2017-05-25 NOTE — Telephone Encounter (Signed)
done

## 2017-05-31 ENCOUNTER — Encounter (HOSPITAL_COMMUNITY): Payer: Self-pay | Admitting: Internal Medicine

## 2017-06-01 ENCOUNTER — Encounter (HOSPITAL_COMMUNITY): Payer: Medicare Other | Attending: Oncology | Admitting: Oncology

## 2017-06-01 ENCOUNTER — Encounter (HOSPITAL_COMMUNITY): Payer: Medicare Other | Attending: Oncology

## 2017-06-01 VITALS — BP 116/59 | HR 66 | Resp 16 | Ht 65.0 in | Wt 160.0 lb

## 2017-06-01 DIAGNOSIS — K7030 Alcoholic cirrhosis of liver without ascites: Secondary | ICD-10-CM

## 2017-06-01 DIAGNOSIS — D890 Polyclonal hypergammaglobulinemia: Secondary | ICD-10-CM | POA: Diagnosis not present

## 2017-06-01 DIAGNOSIS — D539 Nutritional anemia, unspecified: Secondary | ICD-10-CM

## 2017-06-01 DIAGNOSIS — E871 Hypo-osmolality and hyponatremia: Secondary | ICD-10-CM

## 2017-06-01 DIAGNOSIS — K319 Disease of stomach and duodenum, unspecified: Secondary | ICD-10-CM | POA: Diagnosis not present

## 2017-06-01 DIAGNOSIS — D72829 Elevated white blood cell count, unspecified: Secondary | ICD-10-CM | POA: Diagnosis not present

## 2017-06-01 DIAGNOSIS — F1010 Alcohol abuse, uncomplicated: Secondary | ICD-10-CM

## 2017-06-01 DIAGNOSIS — D72820 Lymphocytosis (symptomatic): Secondary | ICD-10-CM | POA: Diagnosis not present

## 2017-06-01 DIAGNOSIS — D72821 Monocytosis (symptomatic): Secondary | ICD-10-CM

## 2017-06-01 DIAGNOSIS — D509 Iron deficiency anemia, unspecified: Secondary | ICD-10-CM

## 2017-06-01 LAB — CBC WITH DIFFERENTIAL/PLATELET
BASOS PCT: 0 %
Basophils Absolute: 0 10*3/uL (ref 0.0–0.1)
EOS ABS: 0.3 10*3/uL (ref 0.0–0.7)
Eosinophils Relative: 2 %
HCT: 31.5 % — ABNORMAL LOW (ref 39.0–52.0)
Hemoglobin: 11.2 g/dL — ABNORMAL LOW (ref 13.0–17.0)
Lymphocytes Relative: 35 %
Lymphs Abs: 3.6 10*3/uL (ref 0.7–4.0)
MCH: 37.5 pg — AB (ref 26.0–34.0)
MCHC: 35.6 g/dL (ref 30.0–36.0)
MCV: 105.4 fL — ABNORMAL HIGH (ref 78.0–100.0)
MONO ABS: 1.1 10*3/uL — AB (ref 0.1–1.0)
MONOS PCT: 11 %
Neutro Abs: 5.4 10*3/uL (ref 1.7–7.7)
Neutrophils Relative %: 52 %
Platelets: 187 10*3/uL (ref 150–400)
RBC: 2.99 MIL/uL — ABNORMAL LOW (ref 4.22–5.81)
RDW: 14.6 % (ref 11.5–15.5)
WBC: 10.4 10*3/uL (ref 4.0–10.5)

## 2017-06-01 LAB — BASIC METABOLIC PANEL
Anion gap: 8 (ref 5–15)
BUN: 10 mg/dL (ref 6–20)
CHLORIDE: 83 mmol/L — AB (ref 101–111)
CO2: 26 mmol/L (ref 22–32)
Calcium: 8.9 mg/dL (ref 8.9–10.3)
Creatinine, Ser: 0.65 mg/dL (ref 0.61–1.24)
GFR calc Af Amer: >60 mL/min (ref >60)
GFR calc non Af Amer: >60 mL/min (ref >60)
GLUCOSE: 114 mg/dL — AB (ref 65–99)
POTASSIUM: 3.8 mmol/L (ref 3.5–5.1)
Sodium: 117 mmol/L — CL (ref 135–145)

## 2017-06-01 LAB — FERRITIN: FERRITIN: 345 ng/mL — AB (ref 24–336)

## 2017-06-01 LAB — IRON AND TIBC
Iron: 101 ug/dL (ref 45–182)
SATURATION RATIOS: 40 % — AB (ref 17.9–39.5)
TIBC: 252 ug/dL (ref 250–450)
UIBC: 151 ug/dL

## 2017-06-01 LAB — FOLATE: Folate: 20.6 ng/mL (ref >5.9)

## 2017-06-01 LAB — VITAMIN B12: Vitamin B-12: 2202 pg/mL — ABNORMAL HIGH (ref 180–914)

## 2017-06-01 MED ORDER — FOLIC ACID 1 MG PO TABS: 1.0000 mg | ORAL_TABLET | Freq: Every day | ORAL | 5 refills | Status: AC
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
## 2017-06-01 NOTE — Progress Notes (Signed)
CRITICAL VALUE ALERT Critical value received:  NA-117 Date of notification:  06/01/17 Time of notification: 1020 Critical value read back:  Yes.   Nurse who received alert:  M.Sathvika Ojo, LPN MD notified (1st page):  L.Janyth Contes, MD
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
## 2017-06-14 ENCOUNTER — Encounter: Payer: Self-pay | Admitting: Adult Health

## 2017-06-14 ENCOUNTER — Ambulatory Visit (INDEPENDENT_AMBULATORY_CARE_PROVIDER_SITE_OTHER): Payer: Medicare Other | Admitting: Adult Health

## 2017-06-14 VITALS — BP 142/76 | HR 91 | Ht 65.0 in | Wt 163.0 lb

## 2017-06-14 DIAGNOSIS — I10 Essential (primary) hypertension: Secondary | ICD-10-CM | POA: Diagnosis not present

## 2017-06-14 DIAGNOSIS — I2510 Atherosclerotic heart disease of native coronary artery without angina pectoris: Secondary | ICD-10-CM

## 2017-06-14 DIAGNOSIS — I350 Nonrheumatic aortic (valve) stenosis: Secondary | ICD-10-CM

## 2017-06-14 NOTE — Patient Instructions (Signed)
Your physician recommends that you schedule a follow-up appointment in:  1 month with Dr.McDowell     Your physician has requested that you have an echocardiogram. Echocardiography is a painless test that uses sound waves to create images of your heart. It provides your doctor with information about the size and shape of your heart and how well your heart's chambers and valves are working. This procedure takes approximately one hour. There are no restrictions for this procedure.     Your physician recommends that you continue on your current medications as directed. Please refer to the Current Medication list given to you today.     If you need a refill on your cardiac medications before your next appointment, please call your pharmacy.      No lab work ordered today.       Thank you for choosing [facility] !

## 2017-06-20 ENCOUNTER — Ambulatory Visit (HOSPITAL_COMMUNITY)
Admission: RE | Admit: 2017-06-20 | Discharge: 2017-06-20 | Disposition: A | Discharge status: Home / Self Care | Payer: Medicare Other | Source: Ambulatory Visit | Attending: Adult Health | Admitting: Adult Health

## 2017-06-20 DIAGNOSIS — I10 Essential (primary) hypertension: Secondary | ICD-10-CM | POA: Insufficient documentation

## 2017-06-20 DIAGNOSIS — I2510 Atherosclerotic heart disease of native coronary artery without angina pectoris: Secondary | ICD-10-CM | POA: Diagnosis not present

## 2017-06-20 DIAGNOSIS — I080 Rheumatic disorders of both mitral and aortic valves: Secondary | ICD-10-CM | POA: Diagnosis not present

## 2017-06-20 DIAGNOSIS — Z87891 Personal history of nicotine dependence: Secondary | ICD-10-CM | POA: Insufficient documentation

## 2017-06-20 DIAGNOSIS — I350 Nonrheumatic aortic (valve) stenosis: Secondary | ICD-10-CM | POA: Diagnosis not present

## 2017-06-20 NOTE — Progress Notes (Signed)
*  PRELIMINARY RESULTS* Echocardiogram 2D Echocardiogram has been performed.  Billy Stone, Billy Stone 06/20/2017, 1:56 PM

## 2017-06-27 ENCOUNTER — Ambulatory Visit (INDEPENDENT_AMBULATORY_CARE_PROVIDER_SITE_OTHER): Payer: Medicare Other | Admitting: Gastroenterology

## 2017-06-27 ENCOUNTER — Encounter: Payer: Self-pay | Admitting: Gastroenterology

## 2017-06-27 VITALS — BP 134/73 | HR 71 | Temp 97.7°F | Ht 65.0 in | Wt 168.0 lb

## 2017-06-27 DIAGNOSIS — K5900 Constipation, unspecified: Secondary | ICD-10-CM | POA: Diagnosis not present

## 2017-06-27 DIAGNOSIS — I2510 Atherosclerotic heart disease of native coronary artery without angina pectoris: Secondary | ICD-10-CM | POA: Diagnosis not present

## 2017-06-27 DIAGNOSIS — K7030 Alcoholic cirrhosis of liver without ascites: Secondary | ICD-10-CM | POA: Diagnosis not present
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
## 2017-06-27 NOTE — Patient Instructions (Signed)
Continue taking Lasix 20 milligrams daily and spironolactone 50 milligrams daily. Your next ultrasound will be in March 2019.   For constipation, take Miralax as needed.   I am glad you are doing better! We will see you in 6 months!

## 2017-06-29 NOTE — Assessment & Plan Note (Signed)
Miralax prn. Continues to decline colonoscopy. Return in 6 months.

## 2017-06-29 NOTE — Assessment & Plan Note (Signed)
66 year old male with history of ETOH cirrhosis, due for US abdomen in March 2019. Doing well with low dose Lasix 20 mg and aldactone 50 mg daily. Weight gain noted with improved appetite. Discussed avoidance of alcohol, but he continues to drink light beer occasionally. Fairly compensated at this time. Return in 6 months.

## 2017-07-03 NOTE — Progress Notes (Signed)
CC'D TO PCP °

## 2017-07-06 DIAGNOSIS — D649 Anemia, unspecified: Secondary | ICD-10-CM | POA: Diagnosis not present

## 2017-07-06 DIAGNOSIS — E871 Hypo-osmolality and hyponatremia: Secondary | ICD-10-CM | POA: Diagnosis not present

## 2017-07-08 ENCOUNTER — Other Ambulatory Visit: Payer: Self-pay | Admitting: Cardiology

## 2017-07-31 ENCOUNTER — Encounter: Payer: Self-pay | Admitting: Cardiology

## 2017-07-31 ENCOUNTER — Ambulatory Visit (INDEPENDENT_AMBULATORY_CARE_PROVIDER_SITE_OTHER): Payer: Medicare Other | Admitting: Cardiology

## 2017-07-31 VITALS — BP 144/86 | HR 67 | Ht 65.0 in | Wt 158.4 lb

## 2017-07-31 DIAGNOSIS — I2510 Atherosclerotic heart disease of native coronary artery without angina pectoris: Secondary | ICD-10-CM

## 2017-07-31 DIAGNOSIS — I350 Nonrheumatic aortic (valve) stenosis: Secondary | ICD-10-CM

## 2017-07-31 DIAGNOSIS — F1020 Alcohol dependence, uncomplicated: Secondary | ICD-10-CM | POA: Diagnosis not present

## 2017-07-31 DIAGNOSIS — I10 Essential (primary) hypertension: Secondary | ICD-10-CM | POA: Diagnosis not present
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
## 2017-07-31 NOTE — Patient Instructions (Signed)
Medication Instructions:  Your physician recommends that you continue on your current medications as directed. Please refer to the Current Medication list given to you today.   Labwork: NONE  Testing/Procedures: NONE  Follow-Up: Your physician wants you to follow-up in: 6 Months with Dr. McDowell. You will receive a reminder letter in the mail two months in advance. If you don't receive a letter, please call our office to schedule the follow-up appointment.   Any Other Special Instructions Will Be Listed Below (If Applicable).     If you need a refill on your cardiac medications before your next appointment, please call your pharmacy. Thank you for choosing [facility]!    

## 2017-08-03 ENCOUNTER — Other Ambulatory Visit: Payer: Self-pay

## 2017-08-06 MED ORDER — PANTOPRAZOLE SODIUM 40 MG PO TBEC: 40.0000 mg | DELAYED_RELEASE_TABLET | Freq: Every day | ORAL | 3 refills | Status: DC

## 2017-08-08 ENCOUNTER — Telehealth: Payer: Self-pay | Admitting: Gastroenterology

## 2017-08-08 NOTE — Telephone Encounter (Signed)
Pt has questions about his medication and if he needed to continue pantoprazole. Please call [phone]

## 2017-08-08 NOTE — Telephone Encounter (Signed)
LMOM to call.

## 2017-08-09 MED ORDER — PANTOPRAZOLE SODIUM 40 MG PO TBEC: 40.0000 mg | DELAYED_RELEASE_TABLET | Freq: Every day | ORAL | 3 refills | Status: DC

## 2017-08-09 NOTE — Telephone Encounter (Signed)
Wow! Well, that's good to know. I sent in prescription.

## 2017-08-09 NOTE — Telephone Encounter (Signed)
LMOM that Rx has been sent in.  

## 2017-08-09 NOTE — Addendum Note (Signed)
Addended by: Gelene MinkBOONE, ANNA W on: 08/09/2017 08:38 AM   Modules accepted: Orders

## 2017-08-09 NOTE — Telephone Encounter (Signed)
[facility] [place]

## 2017-12-26 ENCOUNTER — Ambulatory Visit: Payer: Medicare Other | Admitting: Gastroenterology

## 2018-02-04 DIAGNOSIS — I10 Essential (primary) hypertension: Secondary | ICD-10-CM | POA: Diagnosis not present

## 2018-02-06 ENCOUNTER — Telehealth: Payer: Self-pay | Admitting: Gastroenterology

## 2018-02-06 NOTE — Telephone Encounter (Signed)
Received call from Dr. Scharlene GlossHall's office regarding patient. Hgb 10. Per their records, this is a several gram drop. However, last Hgb I have on file is from Oct 2018 in the 11 range. No overt GI bleeding. Bilirubin in 5 range, which is new from prior values. Still drinking. History of cirrhosis. He missed his last appt in May 2019.   Please put in an urgent in next 1-2 weeks at most. Any APP available.

## 2018-02-06 NOTE — Telephone Encounter (Signed)
Billy Stone had a cancellation so patient coming 02/07/18

## 2018-02-07 ENCOUNTER — Encounter: Payer: Self-pay | Admitting: Nurse Practitioner

## 2018-02-07 ENCOUNTER — Ambulatory Visit (INDEPENDENT_AMBULATORY_CARE_PROVIDER_SITE_OTHER): Payer: Medicare Other | Admitting: Nurse Practitioner

## 2018-02-07 VITALS — BP 115/73 | HR 68 | Temp 97.7°F | Ht 65.0 in | Wt 156.8 lb

## 2018-02-07 DIAGNOSIS — F1010 Alcohol abuse, uncomplicated: Secondary | ICD-10-CM | POA: Diagnosis not present

## 2018-02-07 DIAGNOSIS — R634 Abnormal weight loss: Secondary | ICD-10-CM | POA: Insufficient documentation

## 2018-02-07 DIAGNOSIS — D539 Nutritional anemia, unspecified: Secondary | ICD-10-CM | POA: Diagnosis not present

## 2018-02-07 DIAGNOSIS — K7030 Alcoholic cirrhosis of liver without ascites: Secondary | ICD-10-CM | POA: Diagnosis not present

## 2018-02-07 DIAGNOSIS — R17 Unspecified jaundice: Secondary | ICD-10-CM

## 2018-02-07 NOTE — Patient Instructions (Signed)
1. Have your labs drawn today. 2. We will help schedule your CT scan of your belly. 3. As we discussed, if you have worsening weakness, shortness of breath, develop chest pain, dizziness, lightheadedness, passing out and proceed to the emergency department. 4. Call us if you have worsening symptoms otherwise. 5. We will have you follow-up in our office in 4 weeks. 6. Call us if you have any questions or concerns.  At [facility] we value your feedback. You may receive a survey about your visit today. Please share your experience as we strive to create trusting relationships with our patients to provide genuine, compassionate, quality care.  It was great to meet you today!  I hope you have a great summer!!
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
## 2018-02-08 ENCOUNTER — Encounter: Payer: Self-pay | Admitting: Internal Medicine

## 2018-02-08 LAB — COMPREHENSIVE METABOLIC PANEL
AG Ratio: 0.5 (calc) — ABNORMAL LOW (ref 1.0–2.5)
ALBUMIN MSPROF: 2.5 g/dL — AB (ref 3.6–5.1)
ALKALINE PHOSPHATASE (APISO): 174 U/L — AB (ref 40–115)
ALT: 19 U/L (ref 9–46)
AST: 72 U/L — ABNORMAL HIGH (ref 10–35)
BUN / CREAT RATIO: 7 (calc) (ref 6–22)
BUN: 4 mg/dL — ABNORMAL LOW (ref 7–25)
CHLORIDE: 94 mmol/L — AB (ref 98–110)
CO2: 24 mmol/L (ref 20–32)
CREATININE: 0.57 mg/dL — AB (ref 0.70–1.25)
Calcium: 8.1 mg/dL — ABNORMAL LOW (ref 8.6–10.3)
GLOBULIN: 4.8 g/dL — AB (ref 1.9–3.7)
GLUCOSE: 107 mg/dL — AB (ref 65–99)
POTASSIUM: 4.5 mmol/L (ref 3.5–5.3)
SODIUM: 126 mmol/L — AB (ref 135–146)
TOTAL PROTEIN: 7.3 g/dL (ref 6.1–8.1)
Total Bilirubin: 3.7 mg/dL — ABNORMAL HIGH (ref 0.2–1.2)

## 2018-02-08 LAB — CBC WITH DIFFERENTIAL/PLATELET
BASOS PCT: 0.8 %
Basophils Absolute: 115 cells/uL (ref 0–200)
Eosinophils Absolute: 360 cells/uL (ref 15–500)
Eosinophils Relative: 2.5 %
HCT: 28.1 % — ABNORMAL LOW (ref 38.5–50.0)
Hemoglobin: 10.8 g/dL — ABNORMAL LOW (ref 13.2–17.1)
Lymphs Abs: 5371 cells/uL — ABNORMAL HIGH (ref 850–3900)
MCH: 41.5 pg — ABNORMAL HIGH (ref 27.0–33.0)
MCHC: 38.4 g/dL — ABNORMAL HIGH (ref 32.0–36.0)
MCV: 108.1 fL — AB (ref 80.0–100.0)
MPV: 11.6 fL (ref 7.5–12.5)
Monocytes Relative: 6.5 %
Neutro Abs: 7618 cells/uL (ref 1500–7800)
Neutrophils Relative %: 52.9 %
PLATELETS: 247 10*3/uL (ref 140–400)
RBC: 2.6 10*6/uL — AB (ref 4.20–5.80)
RDW: 15.2 % — ABNORMAL HIGH (ref 11.0–15.0)
TOTAL LYMPHOCYTE: 37.3 %
WBC: 14.4 10*3/uL — AB (ref 3.8–10.8)
WBCMIX: 936 {cells}/uL (ref 200–950)

## 2018-02-08 LAB — PROTIME-INR
INR: 1.3 — ABNORMAL HIGH
Prothrombin Time: 13.3 s — ABNORMAL HIGH (ref 9.0–11.5)

## 2018-02-08 LAB — AFP TUMOR MARKER: AFP TUMOR MARKER: 3.6 ng/mL (ref <6.1)

## 2018-02-08 LAB — FERRITIN: Ferritin: 1150 ng/mL — ABNORMAL HIGH (ref 20–380)

## 2018-02-08 LAB — CBC MORPHOLOGY

## 2018-02-08 LAB — IRON: Iron: 46 ug/dL — ABNORMAL LOW (ref 50–180)

## 2018-02-08 NOTE — Assessment & Plan Note (Signed)
The patient is alcoholic hepatitis continues to drink.  He stated "I only drink light beer which practically has no alcohol in it."  He drinks anywhere from 2-day beers a day.  He is under the delusion that light beer makes it acceptable to drink.  We have recommended he continue to stop drinking.  Question decompensating liver disease versus element of alcoholic hepatitis.  Stat labs and imaging as per above.  ER precautions given.  Follow-up in 4 weeks.

## 2018-02-08 NOTE — Assessment & Plan Note (Signed)
Peers to be mildly jaundiced.  Scleral icterus in the far inferior aspect of his eyes.  There is a slight yellow (low) to his skin.  No obvious or major jaundice.  This likely represents decompensating liver disease in the setting of alcoholic cirrhosis with continued alcohol consumption on a daily basis.  We have again recommended he abstain from alcohol.  We will check labs and imaging.  I will also try to calculate for discriminant function for possible alcoholic hepatitis with need for steroids.  Follow-up in 4 weeks.

## 2018-02-08 NOTE — Assessment & Plan Note (Signed)
The patient has had unintentional weight loss.  Actively he is down 12 pounds in the previous 8 months.  He continues to drink.  He likely has an element of malnutrition due to alcoholism.  You could also be having decompensating liver disease which would certainly fit the picture with his jaundice and other symptoms.  However, there could be more insidious pathology ongoing.  We will check labs and abdominal imaging as per above.  ER precautions given, follow-up in 4 weeks.

## 2018-02-08 NOTE — Assessment & Plan Note (Signed)
The patient unfortunately has alcoholic cirrhosis and continues to drink up to 8 beers a day.  Unfortunately, he is under the delusion that because he drinks only light beer that it practically has no alcohol in it.  Decompensating liver disease could certainly be causing his current symptoms.  We will check stat labs including CBC, CMP, INR.  We will check regular labs including AFP, iron, ferritin.  He is overdue for abdominal imaging and labs related to his cirrhosis.  I will check CT of the abdomen and pelvis which will help evaluate his weight loss, anemia, jaundice to ensure no hepatocellular carcinoma or other significant pathology.  I have given him ER precautions.  I will have him return to the office in 4 weeks.  Recommend he abstain from alcohol.

## 2018-02-08 NOTE — Assessment & Plan Note (Signed)
Chronic anemia.  Our last value in the system was in the 11 range.  Primary care called saying his hemoglobin was 10 and they have his hemoglobin lasting at 13.  Anemia is likely multifactorial in nature.  No obvious GI bleeding per the patient.  CT of the abdomen and pelvis to further evaluate.  I will recheck a CBC stat as well as INR.  I will check standard turnaround iron and ferritin.  Further recommendations based on lab results.  ER precautions given for severe symptomatic anemia or new onset/worsening of GI bleed.  Return for follow-up in 4 weeks.

## 2018-02-11 NOTE — Progress Notes (Signed)
CC'D TO PCP °

## 2018-02-11 NOTE — Progress Notes (Signed)
cc'd to pcp 

## 2018-02-20 ENCOUNTER — Other Ambulatory Visit: Payer: Self-pay | Admitting: Gastroenterology

## 2018-02-22 ENCOUNTER — Encounter (HOSPITAL_COMMUNITY): Payer: Self-pay

## 2018-02-22 ENCOUNTER — Ambulatory Visit (HOSPITAL_COMMUNITY)
Admission: RE | Admit: 2018-02-22 | Discharge: 2018-02-22 | Disposition: A | Discharge status: Home / Self Care | Payer: Medicare Other | Source: Ambulatory Visit | Attending: Nurse Practitioner | Admitting: Nurse Practitioner

## 2018-02-22 DIAGNOSIS — K7460 Unspecified cirrhosis of liver: Secondary | ICD-10-CM | POA: Diagnosis not present

## 2018-02-22 DIAGNOSIS — K760 Fatty (change of) liver, not elsewhere classified: Secondary | ICD-10-CM | POA: Diagnosis not present

## 2018-02-22 DIAGNOSIS — K8020 Calculus of gallbladder without cholecystitis without obstruction: Secondary | ICD-10-CM | POA: Diagnosis not present

## 2018-02-22 DIAGNOSIS — I700 Atherosclerosis of aorta: Secondary | ICD-10-CM | POA: Diagnosis not present

## 2018-02-22 DIAGNOSIS — D539 Nutritional anemia, unspecified: Secondary | ICD-10-CM | POA: Diagnosis not present

## 2018-02-22 DIAGNOSIS — R17 Unspecified jaundice: Secondary | ICD-10-CM

## 2018-02-22 DIAGNOSIS — K7030 Alcoholic cirrhosis of liver without ascites: Secondary | ICD-10-CM

## 2018-02-22 DIAGNOSIS — F1010 Alcohol abuse, uncomplicated: Secondary | ICD-10-CM | POA: Diagnosis not present

## 2018-02-22 IMAGING — CT CT ABD-PELV W/ CM
2 of 5 series · 16 of 46 positions shown, 18 images · IV contrast (iopamidol)
Comparison: [DATE] CT and prior studies

CLINICAL DATA: 67-year-old male with cirrhosis, jaundice and
elevated LFTs. Recent weight loss.

EXAM:
CT ABDOMEN AND PELVIS WITH CONTRAST
TECHNIQUE: Multidetector CT imaging of the abdomen and pelvis was performed
using the standard protocol following bolus administration of
intravenous contrast.
CONTRAST:  100mL [9E] IOPAMIDOL ([9E]) INJECTION 61%

[Series 2: axial st · axial · 0.74mm/px · z∈[+714,+1109]mm · 13 of 89 slices shown, 15 images]
[im 5/89  soft-tissue]
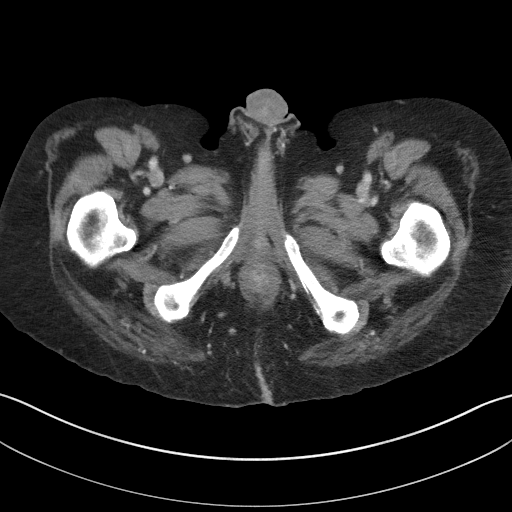
[im 5/89  bone]
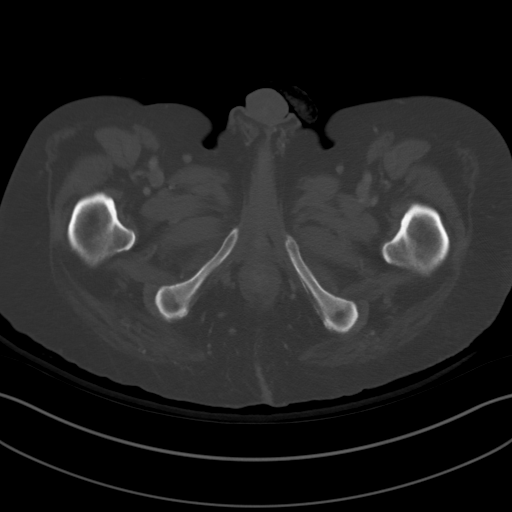
[im 14/89  soft-tissue]
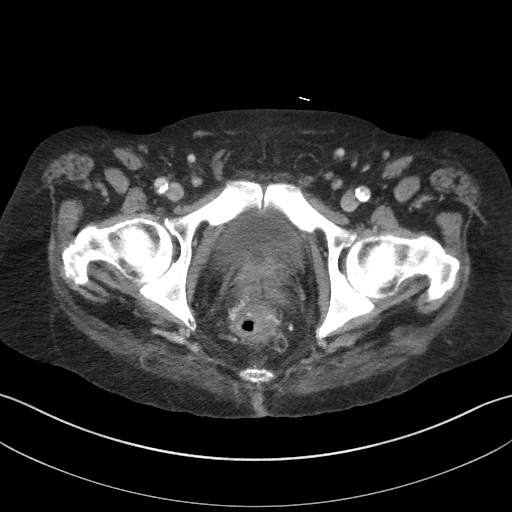
[im 19/89  soft-tissue]
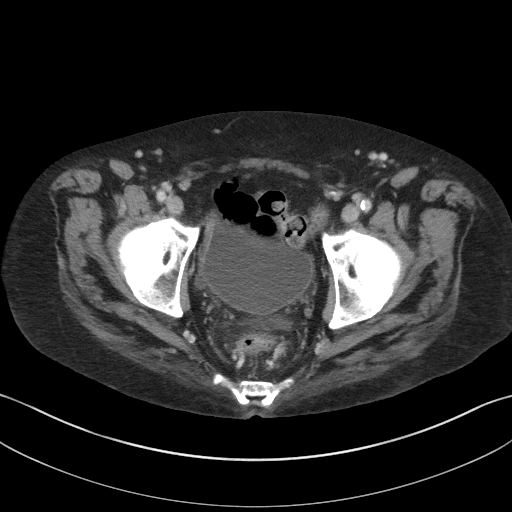
[im 24/89  soft-tissue]
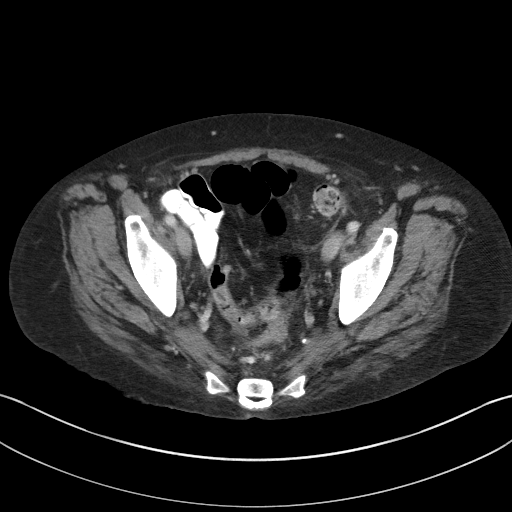
[im 33/89  soft-tissue]
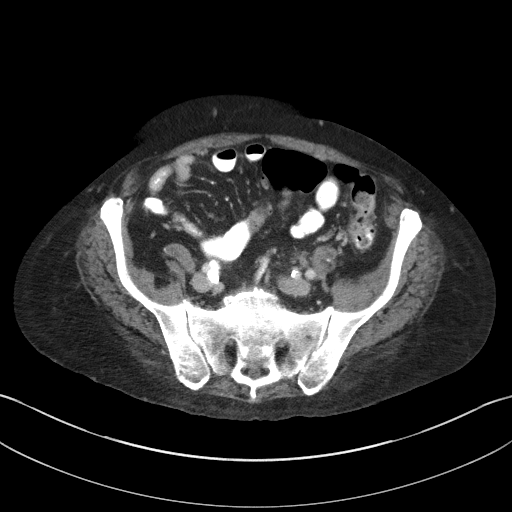
[im 38/89  soft-tissue]
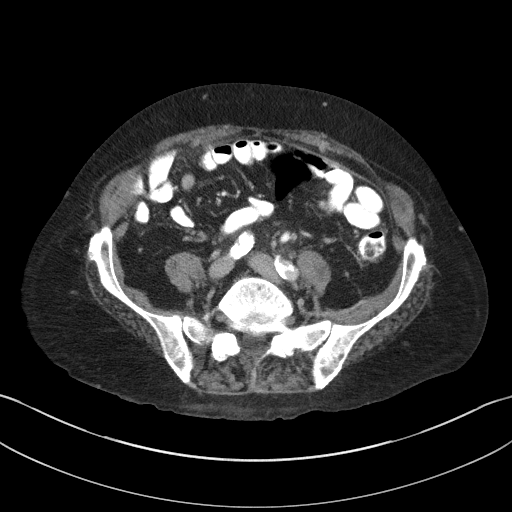
[im 47/89  soft-tissue]
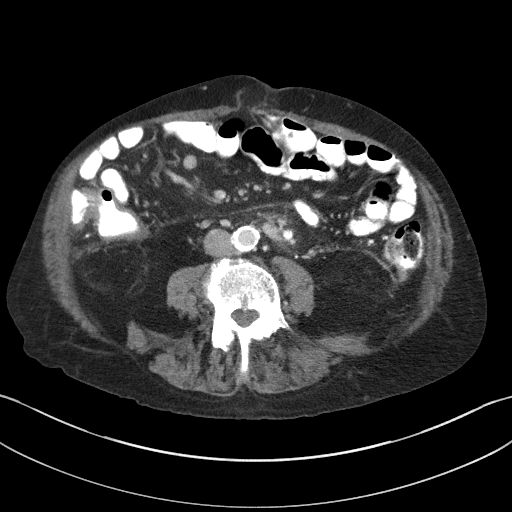
[im 51/89  soft-tissue]
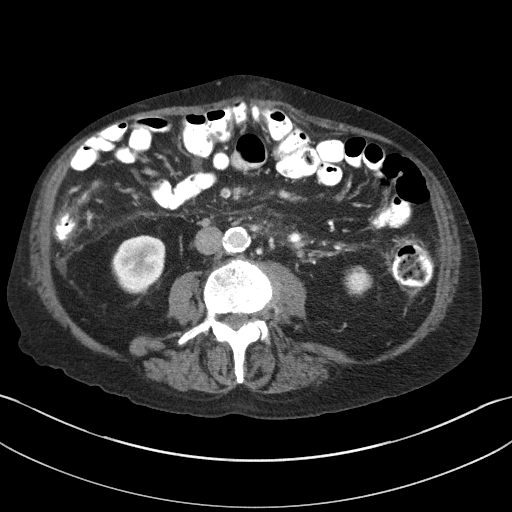
[im 56/89  soft-tissue]
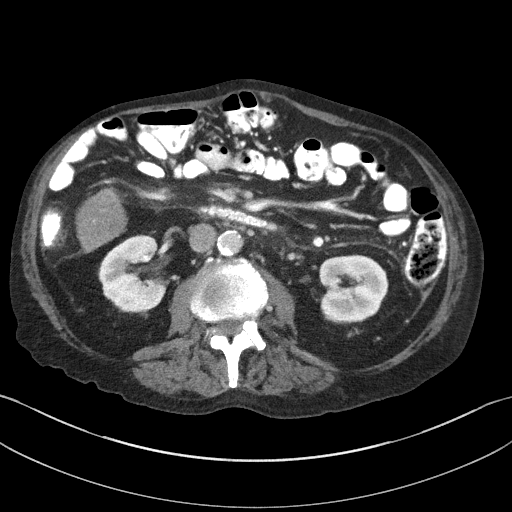
[im 56/89  bone]
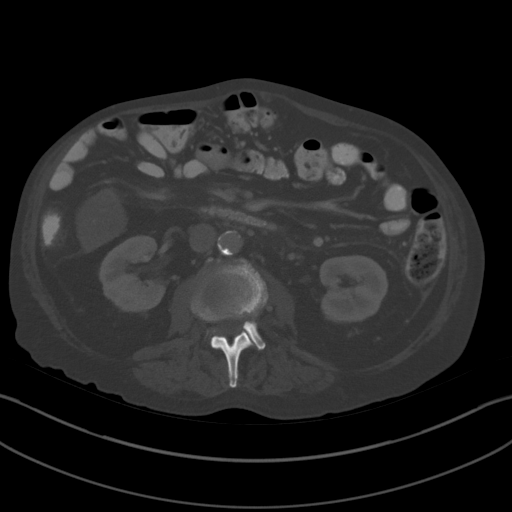
[im 65/89  soft-tissue]
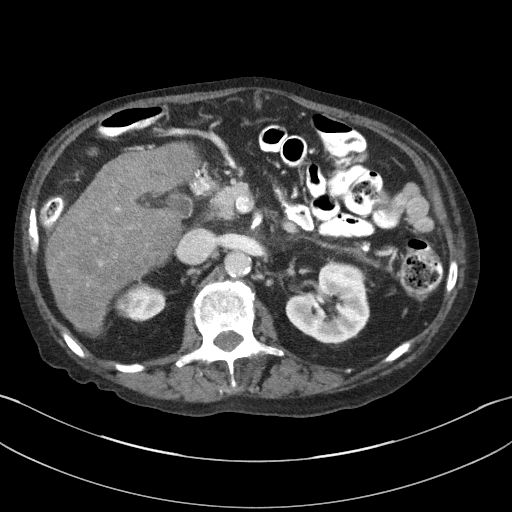
[im 70/89  soft-tissue]
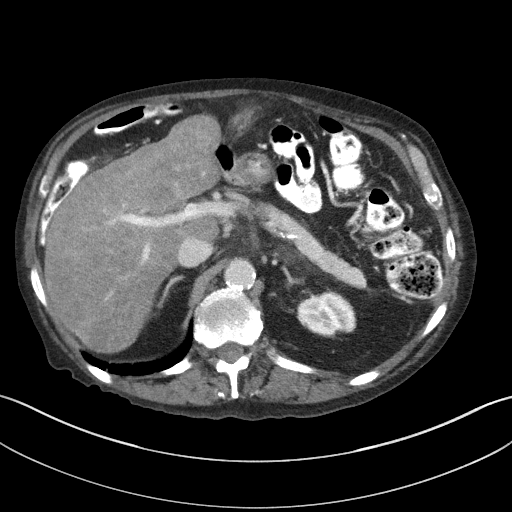
[im 75/89  soft-tissue]
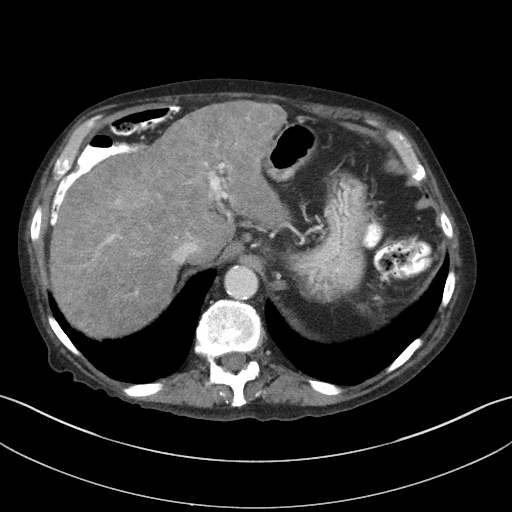
[im 84/89  soft-tissue]
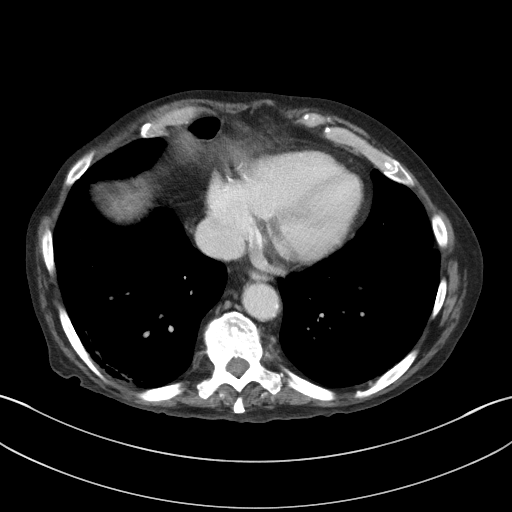

[Series 5: coronal st · coronal · 0.78mm/px · 3 of 102 slices shown]
[im 34/102  soft-tissue]
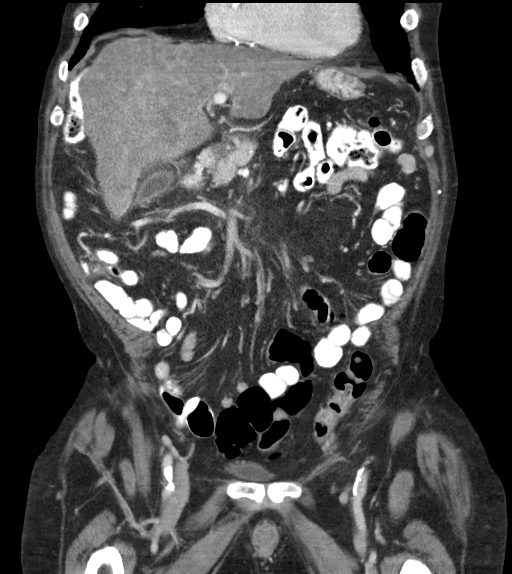
[im 45/102  soft-tissue]
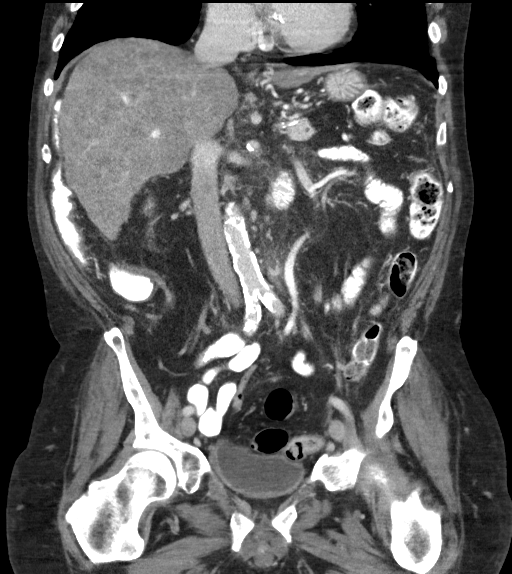
[im 57/102  soft-tissue]
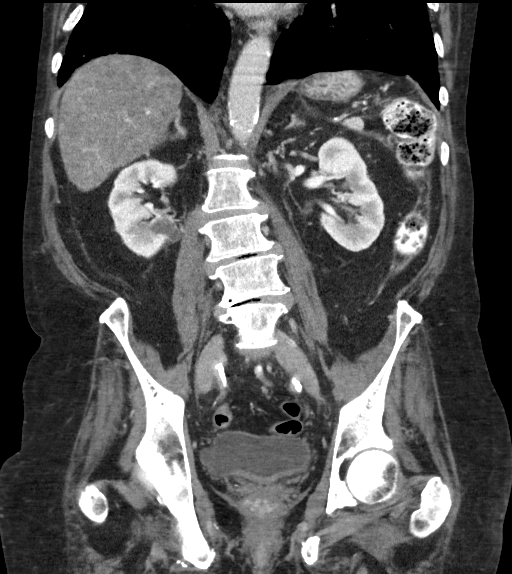

[16 of 46 positions shown; findings below may reference images not displayed]

FINDINGS: Lower chest: No acute abnormality. Mild RIGHT basilar scarring again
identified.

Hepatobiliary: Cirrhosis changes and hepatic steatosis again noted.
No definite focal hepatic mass identified. Cholelithiasis
identified. No biliary dilatation.

Pancreas: Unremarkable

Spleen: Patient is status post splenectomy.

Adrenals/Urinary Tract: The kidneys, adrenal glands and bladder are
unremarkable except for bilateral renal cortical thinning and a
stable RIGHT renal cyst.

Stomach/Bowel: Stomach is within normal limits. Appendix appears
normal. No evidence of bowel wall thickening, distention, or
inflammatory changes.

Vascular/Lymphatic: Aortic atherosclerosis. No enlarged abdominal or
pelvic lymph nodes.

Reproductive: Prostate is unremarkable.

Other: A trace amount of ascites is noted. No abscess or
pneumoperitoneum. A small RIGHT paraumbilical hernia containing fat
is unchanged.

Musculoskeletal: No acute or suspicious bony abnormality. Lumbar
scoliosis and mild multilevel degenerative disc disease again noted.
IMPRESSION: 1. Cirrhosis and hepatic steatosis. No definite focal hepatic
lesion/mass.
2. Cholelithiasis
3.  Aortic Atherosclerosis ([9E]-[9E]).

## 2018-02-22 MED ORDER — IOPAMIDOL (ISOVUE-300) INJECTION 61%
100.0000 mL | Freq: Once | INTRAVENOUS | Status: AC | PRN
  Administered 2018-02-22: 100 mL via INTRAVENOUS

## 2018-02-25 ENCOUNTER — Telehealth: Payer: Self-pay | Admitting: *Deleted

## 2018-02-25 NOTE — Telephone Encounter (Signed)
Received fax for refill on spironolactone but this was sent in already by [facility].

## 2018-03-05 ENCOUNTER — Telehealth: Payer: Self-pay | Admitting: Internal Medicine

## 2018-03-05 MED ORDER — SPIRONOLACTONE 50 MG PO TABS: 50.0000 mg | ORAL_TABLET | Freq: Every day | ORAL | 3 refills | Status: DC

## 2018-03-05 NOTE — Telephone Encounter (Signed)
Tried calling pt back several times. No VM set up. [facility]

## 2018-03-05 NOTE — Telephone Encounter (Signed)
Pt.notified

## 2018-03-05 NOTE — Telephone Encounter (Signed)
PATIENT SAID SOMEONE FROM THE OFFICE CALLED HIM, PLEASE CALL BACK

## 2018-03-05 NOTE — Telephone Encounter (Signed)
Spoke with pt, he is aware that a message was sent to the provider refill box.

## 2018-03-05 NOTE — Telephone Encounter (Signed)
Completed.

## 2018-03-05 NOTE — Telephone Encounter (Signed)
[facility] [place] Spirnolactone 50mg  and they haven't heard from us. Pt is out of his medication. Please call [phone]

## 2018-03-05 NOTE — Addendum Note (Signed)
Addended by: Gelene MinkBOONE, Austine Wiedeman W on: 03/05/2018 02:16 PM   Modules accepted: Orders

## 2018-03-13 ENCOUNTER — Encounter (HOSPITAL_COMMUNITY): Payer: Self-pay | Admitting: *Deleted

## 2018-03-13 ENCOUNTER — Other Ambulatory Visit: Payer: Self-pay

## 2018-03-13 ENCOUNTER — Emergency Department (HOSPITAL_COMMUNITY)
Admission: EM | Admit: 2018-03-13 | Discharge: 2018-03-13 | Disposition: A | Discharge status: Home / Self Care | Payer: Medicare Other | Attending: Emergency Medicine | Admitting: Emergency Medicine

## 2018-03-13 DIAGNOSIS — F10920 Alcohol use, unspecified with intoxication, uncomplicated: Secondary | ICD-10-CM | POA: Insufficient documentation

## 2018-03-13 DIAGNOSIS — R0902 Hypoxemia: Secondary | ICD-10-CM | POA: Diagnosis not present

## 2018-03-13 DIAGNOSIS — R001 Bradycardia, unspecified: Secondary | ICD-10-CM | POA: Diagnosis not present

## 2018-03-13 DIAGNOSIS — I10 Essential (primary) hypertension: Secondary | ICD-10-CM | POA: Insufficient documentation

## 2018-03-13 DIAGNOSIS — I2510 Atherosclerotic heart disease of native coronary artery without angina pectoris: Secondary | ICD-10-CM | POA: Diagnosis not present

## 2018-03-13 DIAGNOSIS — R531 Weakness: Secondary | ICD-10-CM | POA: Insufficient documentation

## 2018-03-13 DIAGNOSIS — Z87891 Personal history of nicotine dependence: Secondary | ICD-10-CM | POA: Diagnosis not present

## 2018-03-13 DIAGNOSIS — I959 Hypotension, unspecified: Secondary | ICD-10-CM | POA: Diagnosis not present

## 2018-03-13 DIAGNOSIS — Z79899 Other long term (current) drug therapy: Secondary | ICD-10-CM | POA: Diagnosis not present

## 2018-03-13 HISTORY — DX: Hypo-osmolality and hyponatremia: E87.1

## 2018-03-13 LAB — COMPREHENSIVE METABOLIC PANEL
ALBUMIN: 2 g/dL — AB (ref 3.5–5.0)
ALK PHOS: 113 U/L (ref 38–126)
ALT: 14 U/L (ref 0–44)
ANION GAP: 16 — AB (ref 5–15)
AST: 64 U/L — ABNORMAL HIGH (ref 15–41)
BILIRUBIN TOTAL: 2.6 mg/dL — AB (ref 0.3–1.2)
BUN: 5 mg/dL — ABNORMAL LOW (ref 8–23)
CALCIUM: 7.7 mg/dL — AB (ref 8.9–10.3)
CO2: 21 mmol/L — ABNORMAL LOW (ref 22–32)
Chloride: 100 mmol/L (ref 98–111)
Creatinine, Ser: 0.79 mg/dL (ref 0.61–1.24)
GFR calc Af Amer: >60 mL/min (ref >60)
GLUCOSE: 94 mg/dL (ref 70–99)
POTASSIUM: 3.7 mmol/L (ref 3.5–5.1)
Sodium: 137 mmol/L (ref 135–145)
TOTAL PROTEIN: 7.3 g/dL (ref 6.5–8.1)

## 2018-03-13 LAB — CBG MONITORING, ED: GLUCOSE-CAPILLARY: 86 mg/dL (ref 70–99)

## 2018-03-13 LAB — ETHANOL: ALCOHOL ETHYL (B): 175 mg/dL — AB (ref <10)

## 2018-03-13 LAB — CBC
HEMATOCRIT: 33.1 % — AB (ref 39.0–52.0)
Hemoglobin: 11.5 g/dL — ABNORMAL LOW (ref 13.0–17.0)
MCH: 39.7 pg — ABNORMAL HIGH (ref 26.0–34.0)
MCHC: 34.7 g/dL (ref 30.0–36.0)
MCV: 114.1 fL — AB (ref 78.0–100.0)
PLATELETS: 222 10*3/uL (ref 150–400)
RBC: 2.9 MIL/uL — AB (ref 4.22–5.81)
RDW: 15.3 % (ref 11.5–15.5)
WBC: 9.1 10*3/uL (ref 4.0–10.5)

## 2018-03-13 LAB — URINALYSIS, ROUTINE W REFLEX MICROSCOPIC
BILIRUBIN URINE: NEGATIVE
Glucose, UA: NEGATIVE mg/dL
HGB URINE DIPSTICK: NEGATIVE
Ketones, ur: NEGATIVE mg/dL
Nitrite: NEGATIVE
Protein, ur: NEGATIVE mg/dL
SPECIFIC GRAVITY, URINE: 1.008 (ref 1.005–1.030)
pH: 6 (ref 5.0–8.0)

## 2018-03-13 LAB — MAGNESIUM: Magnesium: 1.4 mg/dL — ABNORMAL LOW (ref 1.7–2.4)

## 2018-03-13 MED ORDER — CEPHALEXIN 250 MG PO CAPS: 250.0000 mg | ORAL_CAPSULE | Freq: Four times a day (QID) | ORAL | 0 refills | Status: DC

## 2018-03-13 MED ORDER — SODIUM CHLORIDE 0.9 % IV BOLUS
1000.0000 mL | Freq: Once | INTRAVENOUS | Status: AC
  Administered 2018-03-13: 1000 mL via INTRAVENOUS
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
## 2018-03-25 ENCOUNTER — Encounter: Payer: Self-pay | Admitting: Nurse Practitioner

## 2018-03-25 ENCOUNTER — Ambulatory Visit (INDEPENDENT_AMBULATORY_CARE_PROVIDER_SITE_OTHER): Payer: Medicare Other | Admitting: Nurse Practitioner

## 2018-03-25 VITALS — BP 90/52 | HR 73 | Temp 98.4°F | Ht 65.0 in | Wt 151.2 lb

## 2018-03-25 DIAGNOSIS — E43 Unspecified severe protein-calorie malnutrition: Secondary | ICD-10-CM | POA: Diagnosis not present

## 2018-03-25 DIAGNOSIS — R634 Abnormal weight loss: Secondary | ICD-10-CM

## 2018-03-25 DIAGNOSIS — F1010 Alcohol abuse, uncomplicated: Secondary | ICD-10-CM | POA: Diagnosis not present

## 2018-03-25 DIAGNOSIS — K7030 Alcoholic cirrhosis of liver without ascites: Secondary | ICD-10-CM | POA: Diagnosis not present
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
## 2018-03-25 NOTE — Patient Instructions (Signed)
1. As we discussed, it is very important to stop drinking. 2. Have your labs drawn when you are able to. 3. Add Ensure supplement 1-2 times a day to increase her protein intake. 4. He should be eating at least 2 meals a day with significant protein content. 5. Return for follow-up in 2 months. 6. Call us if you have any questions or concerns.  At [facility] we value your feedback. You may receive a survey about your visit today. Please share your experience as we strive to create trusting relationships with our patients to provide genuine, compassionate, quality care.  It was great to see you today!  I hope you have a great summer!!

## 2018-03-27 ENCOUNTER — Other Ambulatory Visit: Payer: Self-pay
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
## 2018-03-29 NOTE — Assessment & Plan Note (Signed)
The patient is severely malnourished.  He appears to have lost significant muscle tone.  He only eats about 1 to 1-1/2 meals a day.  He is not using supplements.  I recommend he increase his meals and protein intake, start Ensure 1-2 supplements a day to help with his malnutrition.  We again recommend he abstain from all alcohol.  Follow-up in 2 months.

## 2018-03-29 NOTE — Assessment & Plan Note (Signed)
The patient has weight loss likely due to significant malnutrition from alcoholism and decreased oral intake as per above.  Further dietary measures as per above.  He appears quite thin, malnourished, near cachectic.  Significant muscle wasting noted.  Follow-up in 2 months.

## 2018-04-01 NOTE — Progress Notes (Signed)
cc'ed to pcp °
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
## 2018-04-08 ENCOUNTER — Ambulatory Visit (INDEPENDENT_AMBULATORY_CARE_PROVIDER_SITE_OTHER): Payer: Medicare Other | Admitting: Cardiology

## 2018-04-08 ENCOUNTER — Encounter: Payer: Self-pay | Admitting: Cardiology

## 2018-04-08 VITALS — BP 102/60 | HR 75 | Ht 65.0 in | Wt 161.2 lb

## 2018-04-08 DIAGNOSIS — I2510 Atherosclerotic heart disease of native coronary artery without angina pectoris: Secondary | ICD-10-CM | POA: Diagnosis not present

## 2018-04-08 DIAGNOSIS — I10 Essential (primary) hypertension: Secondary | ICD-10-CM | POA: Diagnosis not present

## 2018-04-08 DIAGNOSIS — I350 Nonrheumatic aortic (valve) stenosis: Secondary | ICD-10-CM | POA: Diagnosis not present

## 2018-04-08 NOTE — Patient Instructions (Signed)
Medication Instructions:  Your physician recommends that you continue on your current medications as directed. Please refer to the Current Medication list given to you today.   Labwork: NONE  Testing/Procedures: NONE  Follow-Up: Your physician wants you to follow-up in: 6 Months with Dr. McDowell. You will receive a reminder letter in the mail two months in advance. If you don't receive a letter, please call our office to schedule the follow-up appointment.   Any Other Special Instructions Will Be Listed Below (If Applicable).     If you need a refill on your cardiac medications before your next appointment, please call your pharmacy. Thank you for choosing [facility]!    

## 2018-05-29 ENCOUNTER — Telehealth (HOSPITAL_COMMUNITY): Payer: Self-pay | Admitting: *Deleted

## 2018-05-29 NOTE — Telephone Encounter (Signed)
Patient called clinic today stating that he needed to cancel his appointments for labs this week and physician appointment for next week. He states that he will call us back to reschedule. Appts have been cancelled.

## 2018-05-31 ENCOUNTER — Other Ambulatory Visit (HOSPITAL_COMMUNITY): Payer: Medicare Other

## 2018-06-03 ENCOUNTER — Telehealth: Payer: Self-pay | Admitting: Nurse Practitioner

## 2018-06-03 ENCOUNTER — Ambulatory Visit: Payer: Medicare Other | Admitting: Nurse Practitioner

## 2018-06-03 ENCOUNTER — Encounter: Payer: Self-pay | Admitting: Internal Medicine

## 2018-06-03 NOTE — Telephone Encounter (Signed)
Noted  

## 2018-06-03 NOTE — Telephone Encounter (Signed)
PATIENT WAS A NO SHOW AND LETTER SENT  °
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
## 2018-06-07 ENCOUNTER — Ambulatory Visit (HOSPITAL_COMMUNITY): Payer: Medicare Other | Admitting: Internal Medicine

## 2018-06-10 ENCOUNTER — Other Ambulatory Visit (HOSPITAL_COMMUNITY): Payer: Self-pay | Admitting: *Deleted

## 2018-06-10 DIAGNOSIS — D72829 Elevated white blood cell count, unspecified: Secondary | ICD-10-CM

## 2018-06-11 ENCOUNTER — Inpatient Hospital Stay (HOSPITAL_COMMUNITY): Payer: Medicare Other | Attending: Hematology

## 2018-06-11 DIAGNOSIS — D7589 Other specified diseases of blood and blood-forming organs: Secondary | ICD-10-CM | POA: Diagnosis not present

## 2018-06-11 DIAGNOSIS — Z87891 Personal history of nicotine dependence: Secondary | ICD-10-CM | POA: Insufficient documentation

## 2018-06-11 DIAGNOSIS — K7460 Unspecified cirrhosis of liver: Secondary | ICD-10-CM | POA: Insufficient documentation

## 2018-06-11 DIAGNOSIS — D72828 Other elevated white blood cell count: Secondary | ICD-10-CM | POA: Diagnosis not present

## 2018-06-11 DIAGNOSIS — E871 Hypo-osmolality and hyponatremia: Secondary | ICD-10-CM | POA: Diagnosis not present

## 2018-06-11 DIAGNOSIS — D72829 Elevated white blood cell count, unspecified: Secondary | ICD-10-CM

## 2018-06-11 LAB — CBC WITH DIFFERENTIAL/PLATELET
ABS IMMATURE GRANULOCYTES: 0.02 10*3/uL (ref 0.00–0.07)
Basophils Absolute: 0.1 10*3/uL (ref 0.0–0.1)
Basophils Relative: 1 %
Eosinophils Absolute: 0.4 10*3/uL (ref 0.0–0.5)
Eosinophils Relative: 5 %
HEMATOCRIT: 36.1 % — AB (ref 39.0–52.0)
HEMOGLOBIN: 12.1 g/dL — AB (ref 13.0–17.0)
Immature Granulocytes: 0 %
LYMPHS ABS: 3 10*3/uL (ref 0.7–4.0)
LYMPHS PCT: 45 %
MCH: 36.6 pg — ABNORMAL HIGH (ref 26.0–34.0)
MCHC: 33.5 g/dL (ref 30.0–36.0)
MCV: 109.1 fL — ABNORMAL HIGH (ref 80.0–100.0)
MONOS PCT: 12 %
Monocytes Absolute: 0.8 10*3/uL (ref 0.1–1.0)
NEUTROS ABS: 2.5 10*3/uL (ref 1.7–7.7)
NEUTROS PCT: 37 %
Platelets: 255 10*3/uL (ref 150–400)
RBC: 3.31 MIL/uL — ABNORMAL LOW (ref 4.22–5.81)
RDW: 12.7 % (ref 11.5–15.5)
WBC: 6.8 10*3/uL (ref 4.0–10.5)
nRBC: 0 % (ref 0.0–0.2)

## 2018-06-11 LAB — COMPREHENSIVE METABOLIC PANEL
ALT: 11 U/L (ref 0–44)
AST: 31 U/L (ref 15–41)
Albumin: 2.7 g/dL — ABNORMAL LOW (ref 3.5–5.0)
Alkaline Phosphatase: 128 U/L — ABNORMAL HIGH (ref 38–126)
Anion gap: 8 (ref 5–15)
BUN: 10 mg/dL (ref 8–23)
CALCIUM: 8.6 mg/dL — AB (ref 8.9–10.3)
CHLORIDE: 91 mmol/L — AB (ref 98–111)
CO2: 26 mmol/L (ref 22–32)
CREATININE: 0.64 mg/dL (ref 0.61–1.24)
Glucose, Bld: 104 mg/dL — ABNORMAL HIGH (ref 70–99)
Potassium: 4 mmol/L (ref 3.5–5.1)
Sodium: 125 mmol/L — ABNORMAL LOW (ref 135–145)
Total Bilirubin: 2 mg/dL — ABNORMAL HIGH (ref 0.3–1.2)
Total Protein: 8.2 g/dL — ABNORMAL HIGH (ref 6.5–8.1)

## 2018-06-11 LAB — IRON AND TIBC
IRON: 86 ug/dL (ref 45–182)
Saturation Ratios: 28 % (ref 17.9–39.5)
TIBC: 310 ug/dL (ref 250–450)
UIBC: 224 ug/dL

## 2018-06-11 LAB — FOLATE: Folate: 64.3 ng/mL (ref >5.9)

## 2018-06-11 LAB — FERRITIN: FERRITIN: 180 ng/mL (ref 24–336)

## 2018-06-11 LAB — VITAMIN B12: Vitamin B-12: 1071 pg/mL — ABNORMAL HIGH (ref 180–914)

## 2018-06-18 ENCOUNTER — Other Ambulatory Visit: Payer: Self-pay

## 2018-06-18 ENCOUNTER — Inpatient Hospital Stay (HOSPITAL_BASED_OUTPATIENT_CLINIC_OR_DEPARTMENT_OTHER): Payer: Medicare Other | Admitting: Internal Medicine

## 2018-06-18 ENCOUNTER — Encounter (HOSPITAL_COMMUNITY): Payer: Self-pay | Admitting: Internal Medicine

## 2018-06-18 VITALS — BP 110/54 | HR 61 | Resp 18 | Wt 152.4 lb

## 2018-06-18 DIAGNOSIS — K7460 Unspecified cirrhosis of liver: Secondary | ICD-10-CM | POA: Diagnosis not present

## 2018-06-18 DIAGNOSIS — E871 Hypo-osmolality and hyponatremia: Secondary | ICD-10-CM | POA: Diagnosis not present

## 2018-06-18 DIAGNOSIS — Z87891 Personal history of nicotine dependence: Secondary | ICD-10-CM | POA: Diagnosis not present

## 2018-06-18 DIAGNOSIS — D7589 Other specified diseases of blood and blood-forming organs: Secondary | ICD-10-CM

## 2018-06-18 DIAGNOSIS — D72828 Other elevated white blood cell count: Secondary | ICD-10-CM
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
## 2018-07-16 ENCOUNTER — Telehealth: Payer: Self-pay

## 2018-07-16 ENCOUNTER — Other Ambulatory Visit: Payer: Self-pay

## 2018-07-16 NOTE — Telephone Encounter (Signed)
[facility] call from [facility] pt will be using. They will send medication request to our office via fax when needed.

## 2018-07-17 ENCOUNTER — Other Ambulatory Visit: Payer: Self-pay | Admitting: *Deleted

## 2018-07-17 MED ORDER — FUROSEMIDE 20 MG PO TABS: 20.0000 mg | ORAL_TABLET | Freq: Every day | ORAL | 3 refills | Status: DC

## 2018-07-17 MED ORDER — SPIRONOLACTONE 50 MG PO TABS: 50.0000 mg | ORAL_TABLET | Freq: Every day | ORAL | 3 refills | Status: DC

## 2018-07-17 MED ORDER — METOPROLOL TARTRATE 50 MG PO TABS: 50.0000 mg | ORAL_TABLET | Freq: Two times a day (BID) | ORAL | 11 refills | Status: DC

## 2018-07-18 ENCOUNTER — Other Ambulatory Visit: Payer: Self-pay | Admitting: *Deleted

## 2018-07-18 MED ORDER — PANTOPRAZOLE SODIUM 40 MG PO TBEC: 40.0000 mg | DELAYED_RELEASE_TABLET | Freq: Every day | ORAL | 3 refills | Status: DC

## 2018-09-23 ENCOUNTER — Encounter: Payer: Self-pay | Admitting: Nurse Practitioner

## 2018-09-23 ENCOUNTER — Ambulatory Visit (INDEPENDENT_AMBULATORY_CARE_PROVIDER_SITE_OTHER): Payer: Medicare Other | Admitting: Nurse Practitioner

## 2018-09-23 ENCOUNTER — Encounter: Payer: Self-pay | Admitting: *Deleted

## 2018-09-23 VITALS — BP 111/66 | HR 74 | Temp 97.2°F | Ht 65.0 in | Wt 146.4 lb

## 2018-09-23 DIAGNOSIS — R634 Abnormal weight loss: Secondary | ICD-10-CM | POA: Diagnosis not present

## 2018-09-23 DIAGNOSIS — R1310 Dysphagia, unspecified: Secondary | ICD-10-CM

## 2018-09-23 DIAGNOSIS — R1319 Other dysphagia: Secondary | ICD-10-CM

## 2018-09-23 DIAGNOSIS — K7030 Alcoholic cirrhosis of liver without ascites: Secondary | ICD-10-CM

## 2018-09-23 DIAGNOSIS — E43 Unspecified severe protein-calorie malnutrition: Secondary | ICD-10-CM

## 2018-09-23 DIAGNOSIS — F1010 Alcohol abuse, uncomplicated: Secondary | ICD-10-CM
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
## 2018-09-23 NOTE — Patient Instructions (Signed)
Your health issues we discussed today were:   Cirrhosis: 1. As always, we recommend you stop drinking 2. Have your labs drawn when you are able to 3. We will help schedule your ultrasound for you  Swallowing difficulties with pills (pill dysphagia): 1. We will check a swallowing test for you 2. Further recommendations will be made after your swallowing test  Weight loss and poor nutrition: 1. Try to eat a minimum of 2, preferably 3 nutritious meals daily 2. Try to find different protein supplements that she may enjoy more than what you have had already  Overall I recommend:  1. Follow-up in 6 months 2. Call us if you have any questions or concerns.  At [facility] we value your feedback. You may receive a survey about your visit today. Please share your experience as we strive to create trusting relationships with our patients to provide genuine, compassionate, quality care.  We appreciate your understanding and patience as we review any laboratory studies, imaging, and other diagnostic tests that are ordered as we care for you. Our office policy is 5 business days for review of these results, and any emergent or urgent results are addressed in a timely manner for your best interest. If you do not hear from our office in 1 week, please contact us.   We also encourage the use of MyChart, which contains your medical information for your review as well. If you are not enrolled in this feature, an access code is on this after visit summary for your convenience. Thank you for allowing [place] to be involved in your care.  It was great to see you today!  I hope you have a great day!!

## 2018-09-26 ENCOUNTER — Telehealth: Payer: Self-pay

## 2018-09-26 NOTE — Telephone Encounter (Signed)
Lmom, waiting on a return call. Pts lab orders were mailed. Pt was seen in the office last week.

## 2018-09-27 DIAGNOSIS — R1310 Dysphagia, unspecified: Secondary | ICD-10-CM | POA: Insufficient documentation

## 2018-09-27 NOTE — Assessment & Plan Note (Signed)
Significant weight loss in the setting of continued alcoholism, alcohol abuse, cirrhosis.  Discussed dietary measures with him today including adequate protein intake and use of supplements between meals to help ensure he is getting adequate nutrition.  We have again recommended he abstain from alcohol.

## 2018-09-27 NOTE — Assessment & Plan Note (Signed)
Continued alcohol abuse.  He appears to be declining significantly in regards to his overall general health and nutrition.  We have again recommended he abstain from alcohol.  He admits to cutting out beer but now still drinking a half a bottle of whiskey a day.  I fear for his long-term health if he continues to drink.  We discussed this at length today.  He admits he does still drink and he states he will try to quit drinking.

## 2018-09-27 NOTE — Assessment & Plan Note (Signed)
The patient appears quite malnourished.  This is likely due to ongoing alcohol with a half a bottle of whiskey a day.  We have counseled him in the past and continue to do so and promote abstinence from alcohol.  I have encouraged him to ensure he is eating a healthy nutritious diet with adequate protein.  Recommend Ensure and boost type supplements as well.  Follow-up in 6 months.

## 2018-09-27 NOTE — Assessment & Plan Note (Signed)
The patient has alcoholic cirrhosis.  He is still drinking.  He states he is cut out beer but is now drinking "just whiskey" and admits about a half of fifth of whiskey a day.  Denies overt hepatic symptoms.  He appears quite thin, poor hygiene, disheveled, and frail.  Appears significantly malnourished.  There is a slight odor of alcohol on him today, although he was driven by a friend to his appointment.  At this point we will check routine liver labs and right upper quadrant ultrasound for hepatoma screening.  We again recommended that he avoid and abstain from alcohol.  He states he will try.

## 2018-09-27 NOTE — Assessment & Plan Note (Signed)
The patient admits pill dysphagia and typically worse with large pills.  No solid food dysphagia.  Previous EGD in 2018 with no findings that would account for dysphagia.  Other findings as per procedure notes and HPI.  At this point we will check a barium pill esophagram to evaluate his pill dysphagia.  Follow-up in 6 months.

## 2018-09-30 NOTE — Progress Notes (Signed)
cc'd to pcp 

## 2018-10-01 ENCOUNTER — Other Ambulatory Visit (HOSPITAL_COMMUNITY): Payer: Medicare Other

## 2018-10-01 ENCOUNTER — Ambulatory Visit (HOSPITAL_COMMUNITY): Payer: Medicare Other
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
## 2018-10-08 ENCOUNTER — Ambulatory Visit: Payer: Medicare Other | Admitting: Cardiology

## 2019-01-27 DIAGNOSIS — Z0000 Encounter for general adult medical examination without abnormal findings: Secondary | ICD-10-CM | POA: Diagnosis not present

## 2019-03-24 ENCOUNTER — Telehealth: Payer: Self-pay | Admitting: Cardiology

## 2019-03-24 ENCOUNTER — Ambulatory Visit: Payer: Medicare Other | Admitting: Nurse Practitioner

## 2019-03-24 MED ORDER — NITROGLYCERIN 0.4 MG SL SUBL: 0.4000 mg | SUBLINGUAL_TABLET | SUBLINGUAL | 3 refills | Status: DC | PRN

## 2019-03-24 MED ORDER — ISOSORBIDE MONONITRATE ER 30 MG PO TB24: 15.0000 mg | ORAL_TABLET | Freq: Every day | ORAL | 3 refills | Status: DC

## 2019-03-24 NOTE — Telephone Encounter (Signed)
Patient will try Imdur 15 mh HS and has f/u apt 8/17 with Gerrianne Scale PA-C

## 2019-03-24 NOTE — Telephone Encounter (Signed)
New message   Pt c/o of Chest Pain: STAT if CP now or developed within 24 hours  1. Are you having CP right now? no  2. Are you experiencing any other symptoms (ex. SOB, nausea, vomiting, sweating)? Not right now   3. How long have you been experiencing CP? A week   4. Is your CP continuous or coming and going? Comes and goes   5. Have you taken Nitroglycerin?  Yes  But its expired  It helped some  ?

## 2019-03-24 NOTE — Telephone Encounter (Signed)
Patient described tightness in chest with pain in his left arm, rates 3/10 , every day for past week.Noticied when he had to walk from one room in house to the other. States he used 1 NTG (which is old) each time and pain went away although he said ibuprofen work better. I refilled new rx for NTG, currently no pain

## 2019-03-24 NOTE — Telephone Encounter (Signed)
Thank you for the update.  I have not seen him in almost 1 year, please make sure that he has a follow-up visit scheduled.  It would be worth seeing if he might tolerate Imdur 15 mg once daily in the evening.

## 2019-04-09 DIAGNOSIS — I350 Nonrheumatic aortic (valve) stenosis: Secondary | ICD-10-CM | POA: Insufficient documentation
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
## 2019-04-10 ENCOUNTER — Telehealth: Payer: Self-pay | Admitting: Physician Assistant

## 2019-04-10 NOTE — Telephone Encounter (Signed)
Virtual Visit Pre-Appointment Phone Call  "(Name), I am calling you today to discuss your upcoming appointment. We are currently trying to limit exposure to the virus that causes COVID-19 by seeing patients at home rather than in the office."  1. "What is the BEST phone number to call the day of the visit?" - include this in appointment notes  2. Do you have or have access to (through a family member/friend) a smartphone with video capability that we can use for your visit?" a. If yes - list this number in appt notes as cell (if different from BEST phone #) and list the appointment type as a VIDEO visit in appointment notes b. If no - list the appointment type as a PHONE visit in appointment notes  3. Confirm consent - "In the setting of the current Covid19 crisis, you are scheduled for a (phone or video) visit with your provider on (date) at (time).  Just as we do with many in-office visits, in order for you to participate in this visit, we must obtain consent.  If you'd like, I can send this to your mychart (if signed up) or email for you to review.  Otherwise, I can obtain your verbal consent now.  All virtual visits are billed to your insurance company just like a normal visit would be.  By agreeing to a virtual visit, we'd like you to understand that the technology does not allow for your provider to perform an examination, and thus may limit your provider's ability to fully assess your condition. If your provider identifies any concerns that need to be evaluated in person, we will make arrangements to do so.  Finally, though the technology is pretty good, we cannot assure that it will always work on either your or our end, and in the setting of a video visit, we may have to convert it to a phone-only visit.  In either situation, we cannot ensure that we have a secure connection.  Are you willing to proceed?" STAFF: Did the patient verbally acknowledge consent to telehealth visit? Document  YES/NO here: Yes  4. Advise patient to be prepared - "Two hours prior to your appointment, go ahead and check your blood pressure, pulse, oxygen saturation, and your weight (if you have the equipment to check those) and write them all down. When your visit starts, your provider will ask you for this information. If you have an Apple Watch or Kardia device, please plan to have heart rate information ready on the day of your appointment. Please have a pen and paper handy nearby the day of the visit as well."  5. Give patient instructions for MyChart download to smartphone OR [email or web address] as below if video visit (depending on what platform provider is using)  6. Inform patient they will receive a phone call 15 minutes prior to their appointment time (may be from unknown caller ID) so they should be prepared to answer    [place] has been deemed a candidate for a follow-up tele-health visit to limit community exposure during the Covid-19 pandemic. I spoke with the patient via phone to ensure availability of phone/video source, confirm preferred email & phone number, and discuss instructions and expectations.  I reminded Billy Stone to be prepared with any vital sign and/or heart rhythm information that could potentially be obtained via home monitoring, at the time of his visit. I reminded Billy Stone to expect a phone call prior to  his visit.  Billy Stone 04/10/2019 9:40 AM

## 2019-04-14 ENCOUNTER — Encounter: Payer: Self-pay | Admitting: Physician Assistant

## 2019-04-14 ENCOUNTER — Telehealth (INDEPENDENT_AMBULATORY_CARE_PROVIDER_SITE_OTHER): Payer: Medicare Other | Admitting: Physician Assistant

## 2019-04-14 VITALS — BP 114/63 | HR 58 | Ht 65.0 in | Wt 140.0 lb

## 2019-04-14 DIAGNOSIS — Z9181 History of falling: Secondary | ICD-10-CM | POA: Diagnosis not present

## 2019-04-14 DIAGNOSIS — I2510 Atherosclerotic heart disease of native coronary artery without angina pectoris: Secondary | ICD-10-CM

## 2019-04-14 DIAGNOSIS — K7030 Alcoholic cirrhosis of liver without ascites: Secondary | ICD-10-CM

## 2019-04-14 DIAGNOSIS — I350 Nonrheumatic aortic (valve) stenosis: Secondary | ICD-10-CM | POA: Diagnosis not present

## 2019-04-14 DIAGNOSIS — I119 Hypertensive heart disease without heart failure: Secondary | ICD-10-CM

## 2019-04-14 DIAGNOSIS — I10 Essential (primary) hypertension: Secondary | ICD-10-CM

## 2019-04-14 DIAGNOSIS — E871 Hypo-osmolality and hyponatremia: Secondary | ICD-10-CM | POA: Diagnosis not present

## 2019-04-14 NOTE — Addendum Note (Signed)
Addended by: Levonne Hubert on: 04/14/2019 04:53 PM   Modules accepted: Orders

## 2019-04-14 NOTE — Patient Instructions (Signed)
Medication Instructions:  Your physician has recommended you make the following change in your medication:  Increase Imdur to 30 mg Daily  Go To the ER for Prolonged Chest Pain    Labwork: Your physician recommends that you return for lab work in: Just before next visit.    Testing/Procedures: NONE  Follow-Up: Your physician recommends that you schedule a follow-up appointment in: 2 Weeks    Any Other Special Instructions Will Be Listed Below (If Applicable).     If you need a refill on your cardiac medications before your next appointment, please call your pharmacy.  Thank you for choosing [facility]!

## 2019-04-23 DIAGNOSIS — I25119 Atherosclerotic heart disease of native coronary artery with unspecified angina pectoris: Secondary | ICD-10-CM | POA: Insufficient documentation
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
## 2019-04-28 ENCOUNTER — Other Ambulatory Visit: Payer: Self-pay

## 2019-04-28 ENCOUNTER — Encounter: Payer: Self-pay | Admitting: Physician Assistant

## 2019-04-28 ENCOUNTER — Other Ambulatory Visit (HOSPITAL_COMMUNITY)
Admission: RE | Admit: 2019-04-28 | Discharge: 2019-04-28 | Disposition: A | Discharge status: Home / Self Care | Payer: Medicare Other | Source: Ambulatory Visit | Attending: Physician Assistant | Admitting: Physician Assistant

## 2019-04-28 ENCOUNTER — Ambulatory Visit (INDEPENDENT_AMBULATORY_CARE_PROVIDER_SITE_OTHER): Payer: Medicare Other | Admitting: Physician Assistant

## 2019-04-28 VITALS — BP 116/71 | HR 63 | Temp 97.3°F | Ht 65.0 in | Wt 157.0 lb

## 2019-04-28 DIAGNOSIS — I10 Essential (primary) hypertension: Secondary | ICD-10-CM | POA: Diagnosis not present

## 2019-04-28 DIAGNOSIS — Z862 Personal history of diseases of the blood and blood-forming organs and certain disorders involving the immune mechanism: Secondary | ICD-10-CM | POA: Insufficient documentation

## 2019-04-28 DIAGNOSIS — I2510 Atherosclerotic heart disease of native coronary artery without angina pectoris: Secondary | ICD-10-CM

## 2019-04-28 DIAGNOSIS — F1010 Alcohol abuse, uncomplicated: Secondary | ICD-10-CM

## 2019-04-28 DIAGNOSIS — I25119 Atherosclerotic heart disease of native coronary artery with unspecified angina pectoris: Secondary | ICD-10-CM | POA: Diagnosis not present

## 2019-04-28 DIAGNOSIS — I350 Nonrheumatic aortic (valve) stenosis: Secondary | ICD-10-CM

## 2019-04-28 LAB — COMPREHENSIVE METABOLIC PANEL
ALT: 8 U/L (ref 0–44)
AST: 32 U/L (ref 15–41)
Albumin: 2.8 g/dL — ABNORMAL LOW (ref 3.5–5.0)
Alkaline Phosphatase: 138 U/L — ABNORMAL HIGH (ref 38–126)
Anion gap: 8 (ref 5–15)
BUN: 9 mg/dL (ref 8–23)
CO2: 24 mmol/L (ref 22–32)
Calcium: 8.4 mg/dL — ABNORMAL LOW (ref 8.9–10.3)
Chloride: 95 mmol/L — ABNORMAL LOW (ref 98–111)
Creatinine, Ser: 0.65 mg/dL (ref 0.61–1.24)
GFR calc Af Amer: >60 mL/min (ref >60)
GFR calc non Af Amer: >60 mL/min (ref >60)
Glucose, Bld: 113 mg/dL — ABNORMAL HIGH (ref 70–99)
Potassium: 4 mmol/L (ref 3.5–5.1)
Sodium: 127 mmol/L — ABNORMAL LOW (ref 135–145)
Total Bilirubin: 1.8 mg/dL — ABNORMAL HIGH (ref 0.3–1.2)
Total Protein: 8 g/dL (ref 6.5–8.1)

## 2019-04-28 LAB — CBC
HCT: 35.2 % — ABNORMAL LOW (ref 39.0–52.0)
Hemoglobin: 12 g/dL — ABNORMAL LOW (ref 13.0–17.0)
MCH: 37.4 pg — ABNORMAL HIGH (ref 26.0–34.0)
MCHC: 34.1 g/dL (ref 30.0–36.0)
MCV: 109.7 fL — ABNORMAL HIGH (ref 80.0–100.0)
Platelets: 142 10*3/uL — ABNORMAL LOW (ref 150–400)
RBC: 3.21 MIL/uL — ABNORMAL LOW (ref 4.22–5.81)
RDW: 12.9 % (ref 11.5–15.5)
WBC: 10.2 10*3/uL (ref 4.0–10.5)
nRBC: 0 % (ref 0.0–0.2)

## 2019-04-28 LAB — TSH: TSH: 8.395 u[IU]/mL — ABNORMAL HIGH (ref 0.350–4.500)

## 2019-04-28 NOTE — Patient Instructions (Signed)
Medication Instructions:  Your physician recommends that you continue on your current medications as directed. Please refer to the Current Medication list given to you today.   Labwork: Your physician recommends that you return for lab work in: Today    Testing/Procedures: Your physician has requested that you have an echocardiogram. Echocardiography is a painless test that uses sound waves to create images of your heart. It provides your doctor with information about the size and shape of your heart and how well your heart's chambers and valves are working. This procedure takes approximately one hour. There are no restrictions for this procedure.    Follow-Up: Your physician wants you to follow-up in: 6 Month. You will receive a reminder letter in the mail two months in advance. If you don't receive a letter, please call our office to schedule the follow-up appointment.   Any Other Special Instructions Will Be Listed Below (If Applicable).     If you need a refill on your cardiac medications before your next appointment, please call your pharmacy.

## 2019-05-06 ENCOUNTER — Ambulatory Visit (HOSPITAL_COMMUNITY): Payer: Medicare Other | Attending: Physician Assistant

## 2019-06-18 ENCOUNTER — Other Ambulatory Visit (HOSPITAL_COMMUNITY): Payer: Self-pay | Admitting: *Deleted

## 2019-06-18 ENCOUNTER — Other Ambulatory Visit: Payer: Self-pay | Admitting: Nurse Practitioner

## 2019-06-18 ENCOUNTER — Other Ambulatory Visit: Payer: Self-pay | Admitting: Cardiology

## 2019-06-18 DIAGNOSIS — D7589 Other specified diseases of blood and blood-forming organs: Secondary | ICD-10-CM

## 2019-06-18 DIAGNOSIS — D72828 Other elevated white blood cell count: Secondary | ICD-10-CM

## 2019-06-19 ENCOUNTER — Inpatient Hospital Stay (HOSPITAL_COMMUNITY): Payer: Medicare Other

## 2019-06-26 ENCOUNTER — Ambulatory Visit (HOSPITAL_COMMUNITY): Payer: Medicare Other | Admitting: Hematology

## 2019-07-21 ENCOUNTER — Other Ambulatory Visit: Payer: Self-pay

## 2019-08-03 NOTE — Progress Notes (Signed)
REVIEWED-NO ADDITIONAL RECOMMENDATIONS. 

## 2019-08-24 ENCOUNTER — Encounter (HOSPITAL_COMMUNITY): Payer: Self-pay | Admitting: *Deleted

## 2019-08-24 ENCOUNTER — Emergency Department (HOSPITAL_COMMUNITY): Payer: Medicare Other

## 2019-08-24 ENCOUNTER — Inpatient Hospital Stay (HOSPITAL_COMMUNITY)
Admission: EM | Admit: 2019-08-24 | Discharge: 2019-09-01 | DRG: 640 | Disposition: A | Discharge status: Skilled Nursing Facility | Payer: Medicare Other | Attending: Internal Medicine | Admitting: Internal Medicine

## 2019-08-24 ENCOUNTER — Other Ambulatory Visit: Payer: Self-pay

## 2019-08-24 DIAGNOSIS — E739 Lactose intolerance, unspecified: Secondary | ICD-10-CM | POA: Diagnosis present

## 2019-08-24 DIAGNOSIS — I2510 Atherosclerotic heart disease of native coronary artery without angina pectoris: Secondary | ICD-10-CM | POA: Diagnosis present

## 2019-08-24 DIAGNOSIS — S2241XD Multiple fractures of ribs, right side, subsequent encounter for fracture with routine healing: Secondary | ICD-10-CM | POA: Diagnosis not present

## 2019-08-24 DIAGNOSIS — I959 Hypotension, unspecified: Secondary | ICD-10-CM | POA: Diagnosis not present

## 2019-08-24 DIAGNOSIS — F1026 Alcohol dependence with alcohol-induced persisting amnestic disorder: Secondary | ICD-10-CM | POA: Diagnosis present

## 2019-08-24 DIAGNOSIS — Z8249 Family history of ischemic heart disease and other diseases of the circulatory system: Secondary | ICD-10-CM

## 2019-08-24 DIAGNOSIS — I479 Paroxysmal tachycardia, unspecified: Secondary | ICD-10-CM | POA: Diagnosis not present

## 2019-08-24 DIAGNOSIS — I5033 Acute on chronic diastolic (congestive) heart failure: Secondary | ICD-10-CM | POA: Diagnosis present

## 2019-08-24 DIAGNOSIS — K7030 Alcoholic cirrhosis of liver without ascites: Secondary | ICD-10-CM | POA: Diagnosis present

## 2019-08-24 DIAGNOSIS — R531 Weakness: Secondary | ICD-10-CM | POA: Diagnosis not present

## 2019-08-24 DIAGNOSIS — I350 Nonrheumatic aortic (valve) stenosis: Secondary | ICD-10-CM | POA: Diagnosis present

## 2019-08-24 DIAGNOSIS — Z8711 Personal history of peptic ulcer disease: Secondary | ICD-10-CM

## 2019-08-24 DIAGNOSIS — K7040 Alcoholic hepatic failure without coma: Secondary | ICD-10-CM | POA: Diagnosis present

## 2019-08-24 DIAGNOSIS — R296 Repeated falls: Secondary | ICD-10-CM | POA: Diagnosis present

## 2019-08-24 DIAGNOSIS — W19XXXA Unspecified fall, initial encounter: Secondary | ICD-10-CM | POA: Diagnosis present

## 2019-08-24 DIAGNOSIS — Y906 Blood alcohol level of 120-199 mg/100 ml: Secondary | ICD-10-CM | POA: Diagnosis present

## 2019-08-24 DIAGNOSIS — Z515 Encounter for palliative care: Secondary | ICD-10-CM

## 2019-08-24 DIAGNOSIS — E7439 Other disorders of intestinal carbohydrate absorption: Secondary | ICD-10-CM | POA: Diagnosis present

## 2019-08-24 DIAGNOSIS — Z20822 Contact with and (suspected) exposure to covid-19: Secondary | ICD-10-CM | POA: Diagnosis present

## 2019-08-24 DIAGNOSIS — E8809 Other disorders of plasma-protein metabolism, not elsewhere classified: Secondary | ICD-10-CM | POA: Diagnosis not present

## 2019-08-24 DIAGNOSIS — L89312 Pressure ulcer of right buttock, stage 2: Secondary | ICD-10-CM | POA: Diagnosis present

## 2019-08-24 DIAGNOSIS — Z23 Encounter for immunization: Secondary | ICD-10-CM

## 2019-08-24 DIAGNOSIS — Z9081 Acquired absence of spleen: Secondary | ICD-10-CM

## 2019-08-24 DIAGNOSIS — R17 Unspecified jaundice: Secondary | ICD-10-CM | POA: Diagnosis present

## 2019-08-24 DIAGNOSIS — Z789 Other specified health status: Secondary | ICD-10-CM | POA: Diagnosis present

## 2019-08-24 DIAGNOSIS — Z7189 Other specified counseling: Secondary | ICD-10-CM

## 2019-08-24 DIAGNOSIS — I499 Cardiac arrhythmia, unspecified: Secondary | ICD-10-CM | POA: Diagnosis present

## 2019-08-24 DIAGNOSIS — E785 Hyperlipidemia, unspecified: Secondary | ICD-10-CM | POA: Diagnosis present

## 2019-08-24 DIAGNOSIS — Z751 Person awaiting admission to adequate facility elsewhere: Secondary | ICD-10-CM

## 2019-08-24 DIAGNOSIS — R270 Ataxia, unspecified: Secondary | ICD-10-CM

## 2019-08-24 DIAGNOSIS — G9341 Metabolic encephalopathy: Secondary | ICD-10-CM | POA: Diagnosis present

## 2019-08-24 DIAGNOSIS — I110 Hypertensive heart disease with heart failure: Secondary | ICD-10-CM | POA: Diagnosis present

## 2019-08-24 DIAGNOSIS — Z8719 Personal history of other diseases of the digestive system: Secondary | ICD-10-CM | POA: Diagnosis not present

## 2019-08-24 DIAGNOSIS — I10 Essential (primary) hypertension: Secondary | ICD-10-CM | POA: Diagnosis not present

## 2019-08-24 DIAGNOSIS — B9620 Unspecified Escherichia coli [E. coli] as the cause of diseases classified elsewhere: Secondary | ICD-10-CM | POA: Diagnosis present

## 2019-08-24 DIAGNOSIS — E512 Wernicke's encephalopathy: Principal | ICD-10-CM | POA: Diagnosis present

## 2019-08-24 DIAGNOSIS — E43 Unspecified severe protein-calorie malnutrition: Secondary | ICD-10-CM | POA: Diagnosis present

## 2019-08-24 DIAGNOSIS — N39498 Other specified urinary incontinence: Secondary | ICD-10-CM | POA: Diagnosis present

## 2019-08-24 DIAGNOSIS — F1020 Alcohol dependence, uncomplicated: Secondary | ICD-10-CM | POA: Diagnosis present

## 2019-08-24 DIAGNOSIS — D72829 Elevated white blood cell count, unspecified: Secondary | ICD-10-CM | POA: Diagnosis present

## 2019-08-24 DIAGNOSIS — E871 Hypo-osmolality and hyponatremia: Secondary | ICD-10-CM | POA: Diagnosis not present

## 2019-08-24 DIAGNOSIS — R1310 Dysphagia, unspecified: Secondary | ICD-10-CM

## 2019-08-24 DIAGNOSIS — Z96651 Presence of right artificial knee joint: Secondary | ICD-10-CM | POA: Diagnosis present

## 2019-08-24 DIAGNOSIS — I480 Paroxysmal atrial fibrillation: Secondary | ICD-10-CM | POA: Diagnosis present

## 2019-08-24 DIAGNOSIS — I4891 Unspecified atrial fibrillation: Secondary | ICD-10-CM | POA: Diagnosis not present

## 2019-08-24 DIAGNOSIS — R7989 Other specified abnormal findings of blood chemistry: Secondary | ICD-10-CM | POA: Diagnosis present

## 2019-08-24 DIAGNOSIS — R0602 Shortness of breath: Secondary | ICD-10-CM | POA: Diagnosis present

## 2019-08-24 DIAGNOSIS — F10229 Alcohol dependence with intoxication, unspecified: Secondary | ICD-10-CM | POA: Diagnosis present

## 2019-08-24 DIAGNOSIS — K7031 Alcoholic cirrhosis of liver with ascites: Secondary | ICD-10-CM | POA: Diagnosis present

## 2019-08-24 DIAGNOSIS — K219 Gastro-esophageal reflux disease without esophagitis: Secondary | ICD-10-CM | POA: Diagnosis present

## 2019-08-24 DIAGNOSIS — R634 Abnormal weight loss: Secondary | ICD-10-CM | POA: Diagnosis present

## 2019-08-24 DIAGNOSIS — R69 Illness, unspecified: Secondary | ICD-10-CM | POA: Diagnosis not present

## 2019-08-24 DIAGNOSIS — Z6822 Body mass index (BMI) 22.0-22.9, adult: Secondary | ICD-10-CM

## 2019-08-24 DIAGNOSIS — N390 Urinary tract infection, site not specified: Secondary | ICD-10-CM | POA: Diagnosis present

## 2019-08-24 DIAGNOSIS — K7200 Acute and subacute hepatic failure without coma: Secondary | ICD-10-CM | POA: Diagnosis present

## 2019-08-24 DIAGNOSIS — R260 Ataxic gait: Secondary | ICD-10-CM | POA: Diagnosis present

## 2019-08-24 DIAGNOSIS — F1010 Alcohol abuse, uncomplicated: Secondary | ICD-10-CM

## 2019-08-24 DIAGNOSIS — R0603 Acute respiratory distress: Secondary | ICD-10-CM | POA: Diagnosis present

## 2019-08-24 DIAGNOSIS — I509 Heart failure, unspecified: Secondary | ICD-10-CM

## 2019-08-24 DIAGNOSIS — N401 Enlarged prostate with lower urinary tract symptoms: Secondary | ICD-10-CM | POA: Diagnosis present

## 2019-08-24 DIAGNOSIS — I361 Nonrheumatic tricuspid (valve) insufficiency: Secondary | ICD-10-CM | POA: Diagnosis not present

## 2019-08-24 DIAGNOSIS — R188 Other ascites: Secondary | ICD-10-CM | POA: Diagnosis present

## 2019-08-24 DIAGNOSIS — K759 Inflammatory liver disease, unspecified: Secondary | ICD-10-CM | POA: Diagnosis present

## 2019-08-24 DIAGNOSIS — T83511A Infection and inflammatory reaction due to indwelling urethral catheter, initial encounter: Secondary | ICD-10-CM | POA: Diagnosis not present

## 2019-08-24 DIAGNOSIS — S2241XA Multiple fractures of ribs, right side, initial encounter for closed fracture: Secondary | ICD-10-CM | POA: Diagnosis present

## 2019-08-24 DIAGNOSIS — E876 Hypokalemia: Secondary | ICD-10-CM | POA: Diagnosis present

## 2019-08-24 DIAGNOSIS — S2249XA Multiple fractures of ribs, unspecified side, initial encounter for closed fracture: Secondary | ICD-10-CM | POA: Diagnosis present

## 2019-08-24 DIAGNOSIS — F10929 Alcohol use, unspecified with intoxication, unspecified: Secondary | ICD-10-CM | POA: Diagnosis present

## 2019-08-24 DIAGNOSIS — J189 Pneumonia, unspecified organism: Secondary | ICD-10-CM | POA: Diagnosis present

## 2019-08-24 DIAGNOSIS — D696 Thrombocytopenia, unspecified: Secondary | ICD-10-CM | POA: Diagnosis present

## 2019-08-24 DIAGNOSIS — L8990 Pressure ulcer of unspecified site, unspecified stage: Secondary | ICD-10-CM | POA: Diagnosis present

## 2019-08-24 DIAGNOSIS — F1021 Alcohol dependence, in remission: Secondary | ICD-10-CM | POA: Diagnosis present

## 2019-08-24 DIAGNOSIS — D6959 Other secondary thrombocytopenia: Secondary | ICD-10-CM | POA: Diagnosis present

## 2019-08-24 DIAGNOSIS — E538 Deficiency of other specified B group vitamins: Secondary | ICD-10-CM | POA: Diagnosis present

## 2019-08-24 DIAGNOSIS — K7460 Unspecified cirrhosis of liver: Secondary | ICD-10-CM | POA: Diagnosis present

## 2019-08-24 DIAGNOSIS — F10129 Alcohol abuse with intoxication, unspecified: Secondary | ICD-10-CM | POA: Diagnosis not present

## 2019-08-24 DIAGNOSIS — I5031 Acute diastolic (congestive) heart failure: Secondary | ICD-10-CM | POA: Diagnosis not present

## 2019-08-24 LAB — URINALYSIS, ROUTINE W REFLEX MICROSCOPIC
Bilirubin Urine: NEGATIVE
Glucose, UA: NEGATIVE mg/dL
Ketones, ur: NEGATIVE mg/dL
Nitrite: POSITIVE — AB
Protein, ur: 30 mg/dL — AB
Specific Gravity, Urine: 1.015 (ref 1.005–1.030)
WBC, UA: >50 WBC/hpf — ABNORMAL HIGH (ref 0–5)
pH: 5 (ref 5.0–8.0)

## 2019-08-24 LAB — CBG MONITORING, ED: Glucose-Capillary: 94 mg/dL (ref 70–99)

## 2019-08-24 LAB — COMPREHENSIVE METABOLIC PANEL
ALT: 16 U/L (ref 0–44)
AST: 48 U/L — ABNORMAL HIGH (ref 15–41)
Albumin: 2.9 g/dL — ABNORMAL LOW (ref 3.5–5.0)
Alkaline Phosphatase: 96 U/L (ref 38–126)
Anion gap: 12 (ref 5–15)
BUN: 10 mg/dL (ref 8–23)
CO2: 24 mmol/L (ref 22–32)
Calcium: 8.1 mg/dL — ABNORMAL LOW (ref 8.9–10.3)
Chloride: 96 mmol/L — ABNORMAL LOW (ref 98–111)
Creatinine, Ser: 0.79 mg/dL (ref 0.61–1.24)
GFR calc Af Amer: >60 mL/min (ref >60)
GFR calc non Af Amer: >60 mL/min (ref >60)
Glucose, Bld: 101 mg/dL — ABNORMAL HIGH (ref 70–99)
Potassium: 4 mmol/L (ref 3.5–5.1)
Sodium: 132 mmol/L — ABNORMAL LOW (ref 135–145)
Total Bilirubin: 3.1 mg/dL — ABNORMAL HIGH (ref 0.3–1.2)
Total Protein: 8.1 g/dL (ref 6.5–8.1)

## 2019-08-24 LAB — CBC WITH DIFFERENTIAL/PLATELET
Abs Immature Granulocytes: 0.19 10*3/uL — ABNORMAL HIGH (ref 0.00–0.07)
Basophils Absolute: 0.1 10*3/uL (ref 0.0–0.1)
Basophils Relative: 1 %
Eosinophils Absolute: 0 10*3/uL (ref 0.0–0.5)
Eosinophils Relative: 0 %
HCT: 31.8 % — ABNORMAL LOW (ref 39.0–52.0)
Hemoglobin: 11 g/dL — ABNORMAL LOW (ref 13.0–17.0)
Immature Granulocytes: 2 %
Lymphocytes Relative: 16 %
Lymphs Abs: 1.9 10*3/uL (ref 0.7–4.0)
MCH: 36.9 pg — ABNORMAL HIGH (ref 26.0–34.0)
MCHC: 34.6 g/dL (ref 30.0–36.0)
MCV: 106.7 fL — ABNORMAL HIGH (ref 80.0–100.0)
Monocytes Absolute: 0.6 10*3/uL (ref 0.1–1.0)
Monocytes Relative: 5 %
Neutro Abs: 9.3 10*3/uL — ABNORMAL HIGH (ref 1.7–7.7)
Neutrophils Relative %: 76 %
Platelets: 71 10*3/uL — ABNORMAL LOW (ref 150–400)
RBC: 2.98 MIL/uL — ABNORMAL LOW (ref 4.22–5.81)
RDW: 14.1 % (ref 11.5–15.5)
WBC: 12 10*3/uL — ABNORMAL HIGH (ref 4.0–10.5)
nRBC: 0.2 % (ref 0.0–0.2)

## 2019-08-24 LAB — RAPID URINE DRUG SCREEN, HOSP PERFORMED
Amphetamines: NOT DETECTED
Barbiturates: NOT DETECTED
Benzodiazepines: NOT DETECTED
Cocaine: NOT DETECTED
Opiates: NOT DETECTED
Tetrahydrocannabinol: NOT DETECTED

## 2019-08-24 LAB — APTT: aPTT: 34 seconds (ref 24–36)

## 2019-08-24 LAB — TSH
TSH: 1.811 u[IU]/mL (ref 0.350–4.500)
TSH: 1.845 u[IU]/mL (ref 0.350–4.500)

## 2019-08-24 LAB — AMMONIA: Ammonia: 39 umol/L — ABNORMAL HIGH (ref 9–35)

## 2019-08-24 LAB — LIPASE, BLOOD: Lipase: 31 U/L (ref 11–51)

## 2019-08-24 LAB — POC OCCULT BLOOD, ED: Fecal Occult Bld: NEGATIVE

## 2019-08-24 LAB — VITAMIN B12: Vitamin B-12: 2847 pg/mL — ABNORMAL HIGH (ref 180–914)

## 2019-08-24 LAB — ETHANOL: Alcohol, Ethyl (B): 148 mg/dL — ABNORMAL HIGH (ref <10)

## 2019-08-24 LAB — PROTIME-INR
INR: 1.3 — ABNORMAL HIGH (ref 0.8–1.2)
Prothrombin Time: 16.2 seconds — ABNORMAL HIGH (ref 11.4–15.2)

## 2019-08-24 LAB — PHOSPHORUS: Phosphorus: 3.3 mg/dL (ref 2.5–4.6)

## 2019-08-24 LAB — SALICYLATE LEVEL: Salicylate Lvl: <7 mg/dL — ABNORMAL LOW (ref 7.0–30.0)

## 2019-08-24 LAB — MAGNESIUM: Magnesium: 1.2 mg/dL — ABNORMAL LOW (ref 1.7–2.4)

## 2019-08-24 LAB — ACETAMINOPHEN LEVEL: Acetaminophen (Tylenol), Serum: <10 ug/mL — ABNORMAL LOW (ref 10–30)

## 2019-08-24 IMAGING — DX DG CHEST 1V PORT
1 series · 1 of 1 positions shown · non-contrast
Comparison: [DATE] and earlier.

CLINICAL DATA: 68-year-old presenting with generalized weakness.

EXAM:
PORTABLE CHEST 1 VIEW

[chest ap]
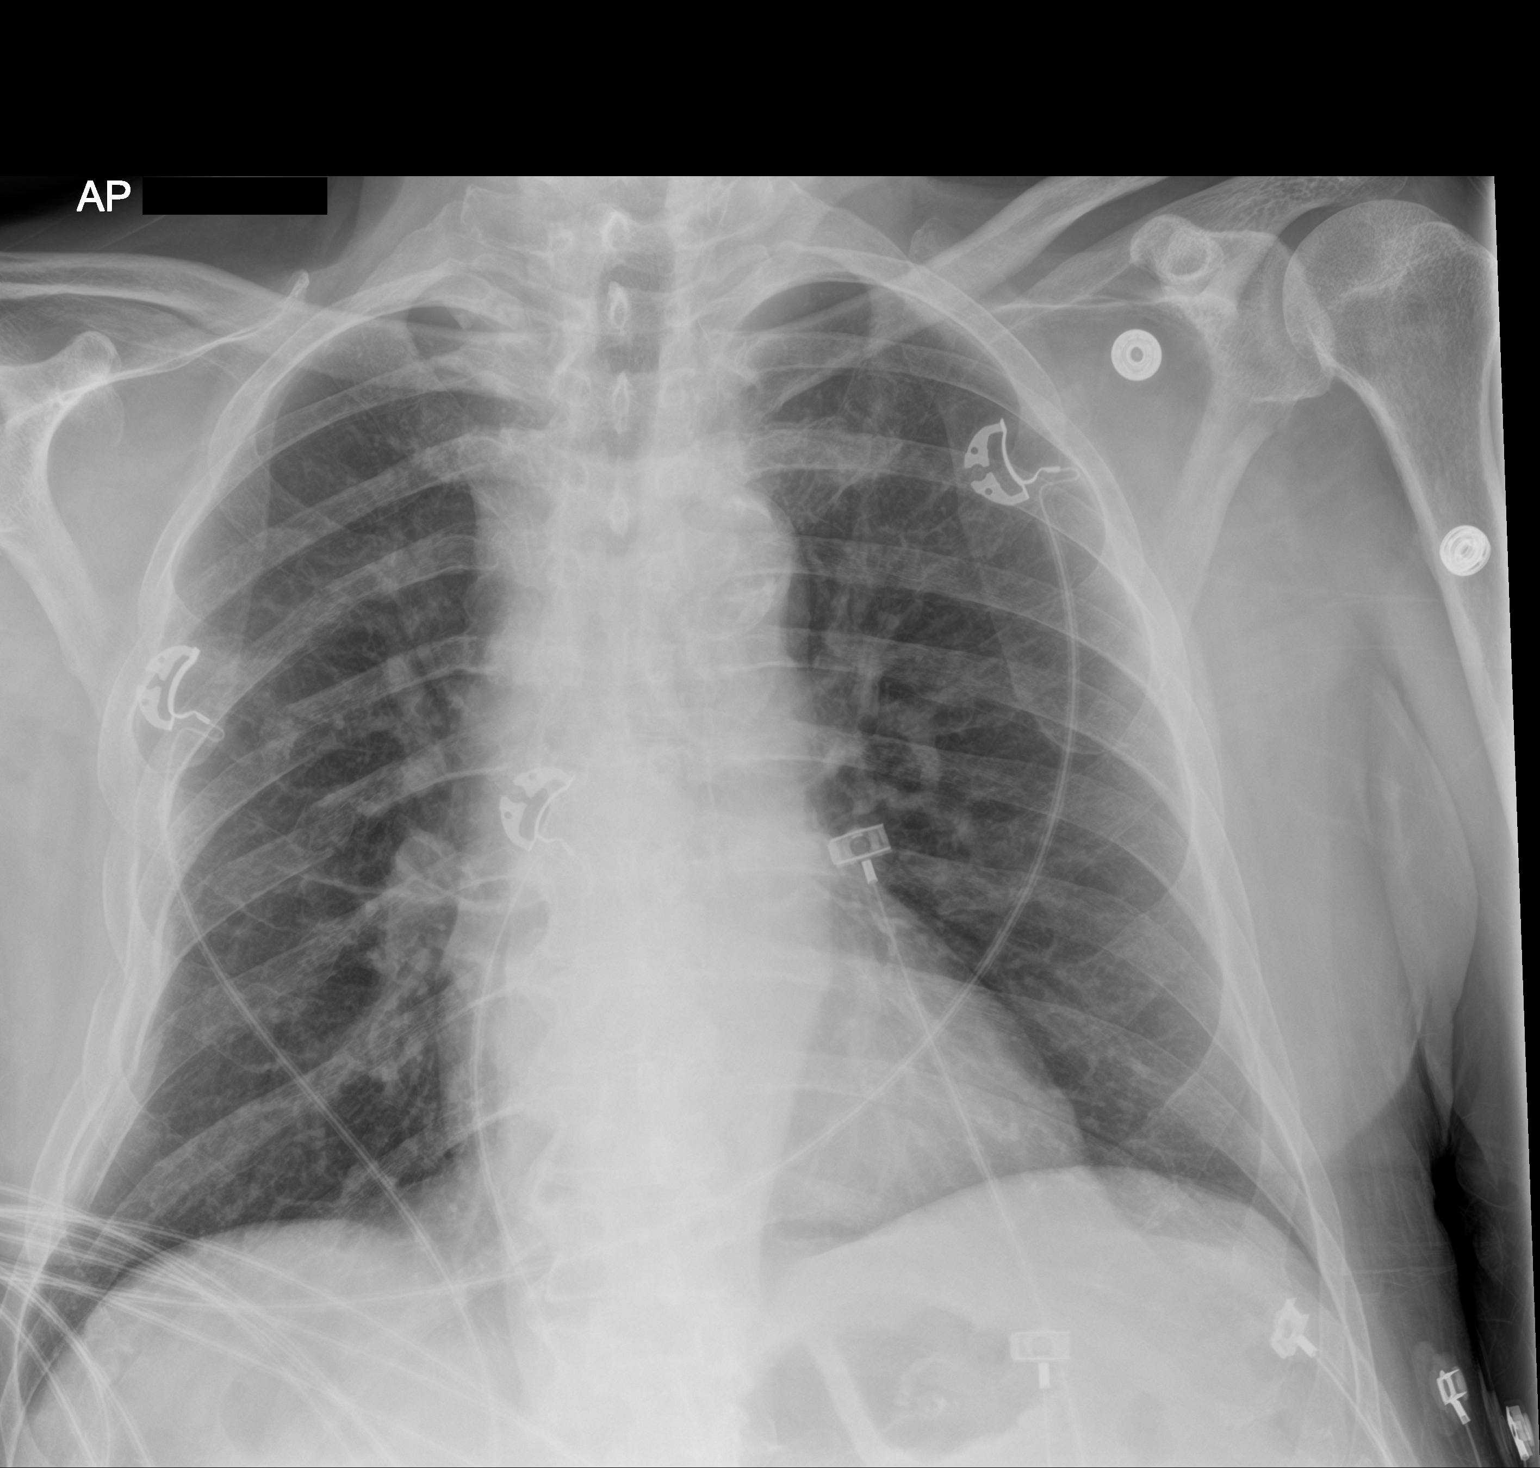

[1 of 1 positions shown; findings below may reference images not displayed]

FINDINGS: Cardiac silhouette normal in size, unchanged. Lungs clear.
Bronchovascular markings normal. Pulmonary vascularity normal. No
visible pleural effusions. No pneumothorax. Remote RIGHT rib
fractures with incomplete union again noted. No interval change.
IMPRESSION: No acute cardiopulmonary disease.  Stable examination.

## 2019-08-24 IMAGING — CT CT HEAD W/O CM
3 series · 16 of 47 positions shown, 19 images · non-contrast
Comparison: Prior head CT [DATE]

CLINICAL DATA: 68-year-old male with weakness, gait disturbance and
multiple falls over the past week

EXAM:
CT HEAD WITHOUT CONTRAST
TECHNIQUE: Contiguous axial images were obtained from the base of the skull
through the vertex without intravenous contrast.

[Series 2: head w o · axial · 0.46mm/px · z∈[-15,+110]mm · 10 of 30 slices shown, 13 images]
[im 3/30  brain]
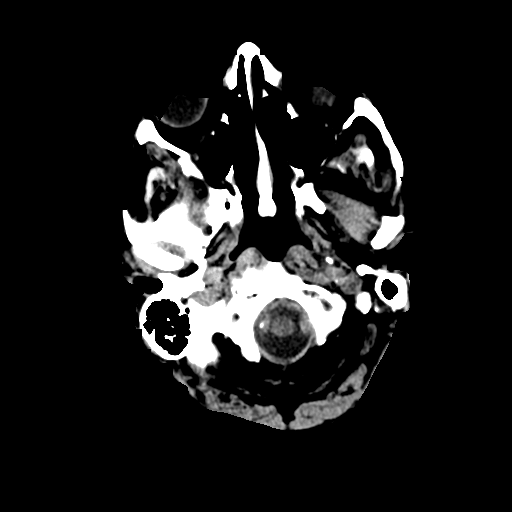
[im 3/30  bone]
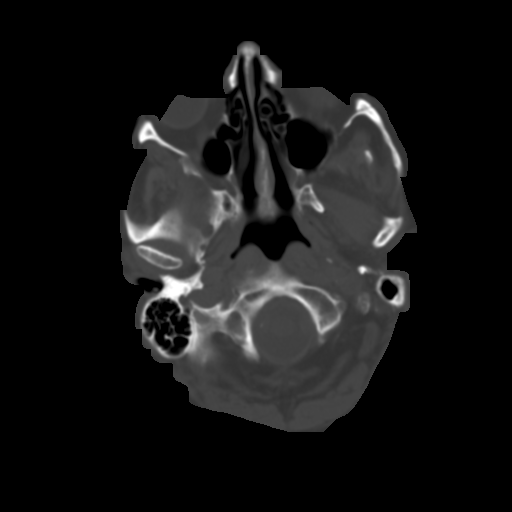
[im 6/30  brain]
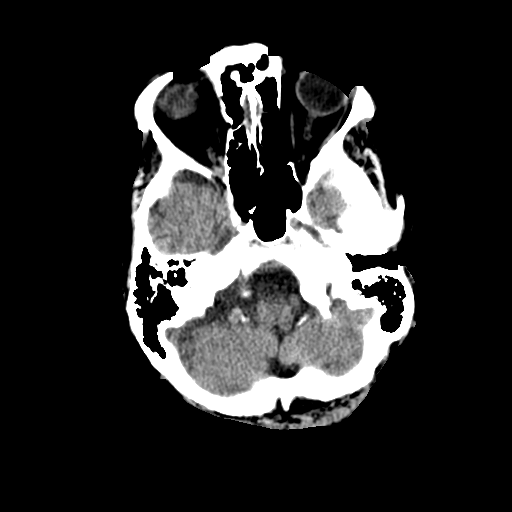
[im 9/30  brain]
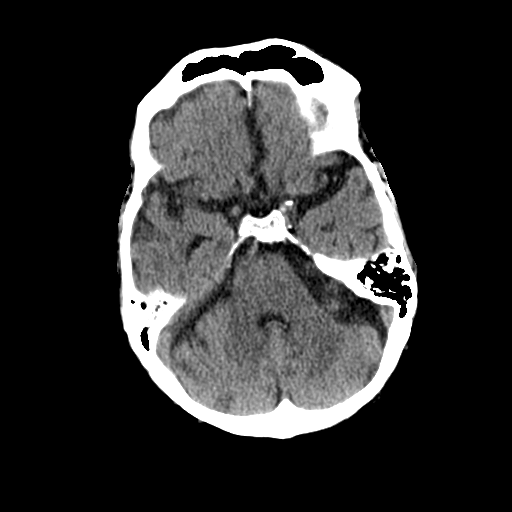
[im 11/30  brain]
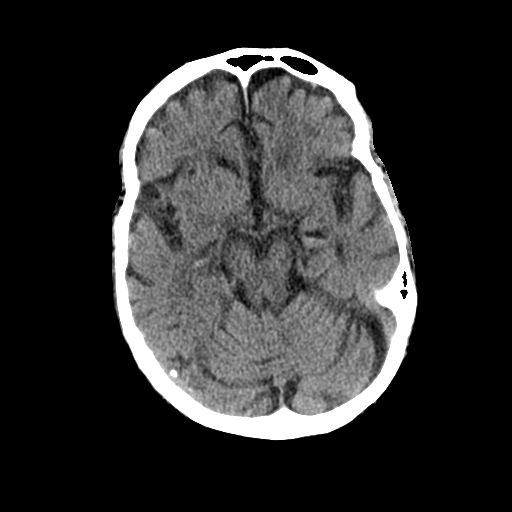
[im 14/30  brain]
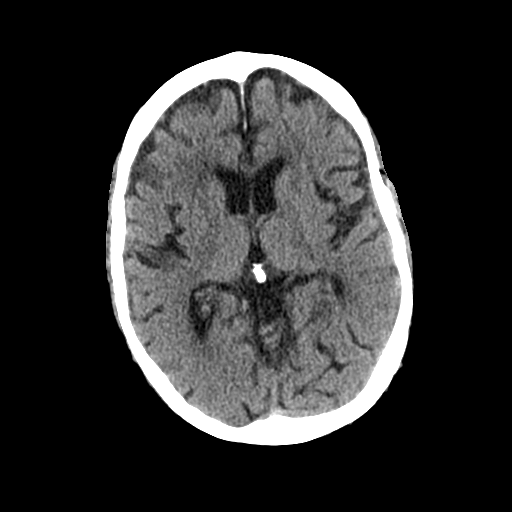
[im 14/30  bone]
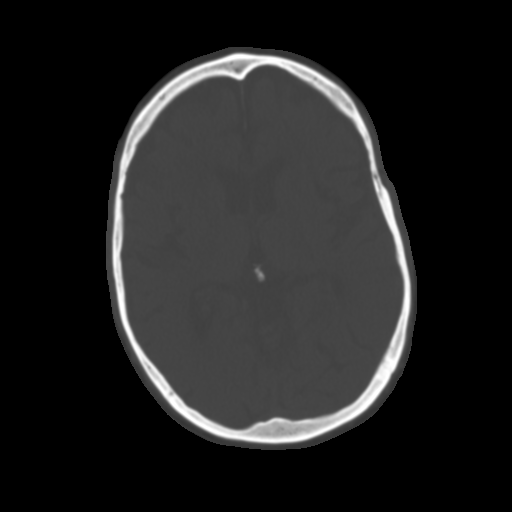
[im 17/30  brain]
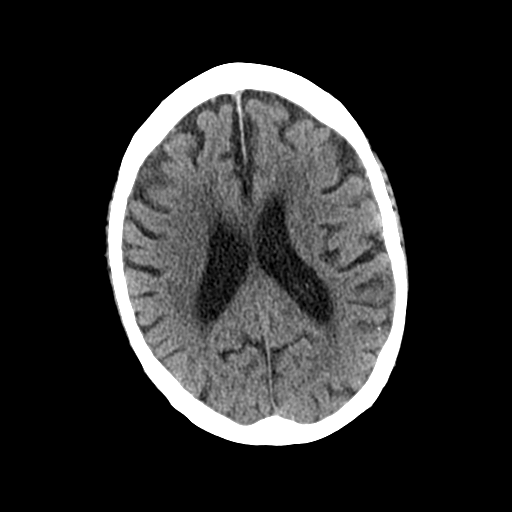
[im 20/30  brain]
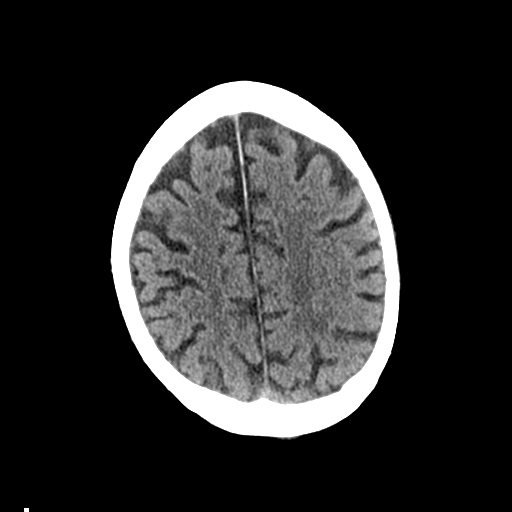
[im 23/30  brain]
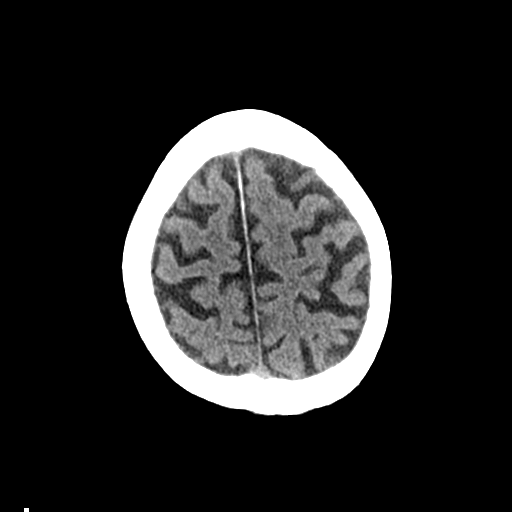
[im 25/30  brain]
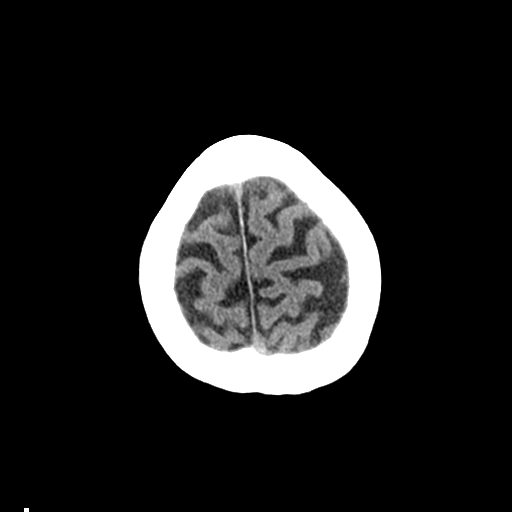
[im 25/30  bone]
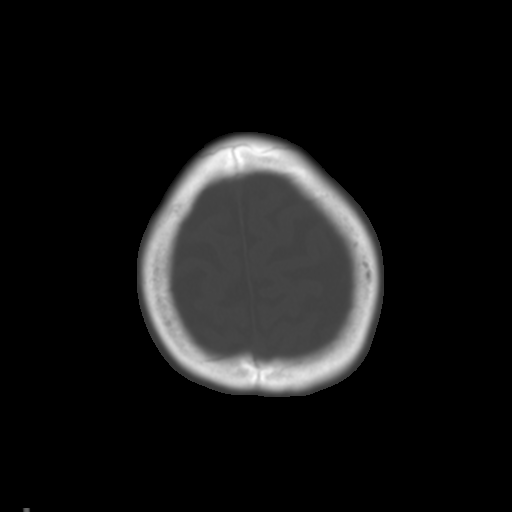
[im 28/30  brain]
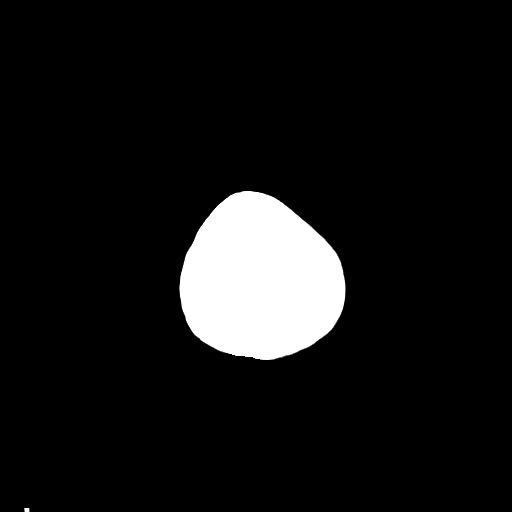

[Series 4: coronal soft · coronal · 0.33mm/px · 3 of 67 slices shown]
[im 23/67  brain]
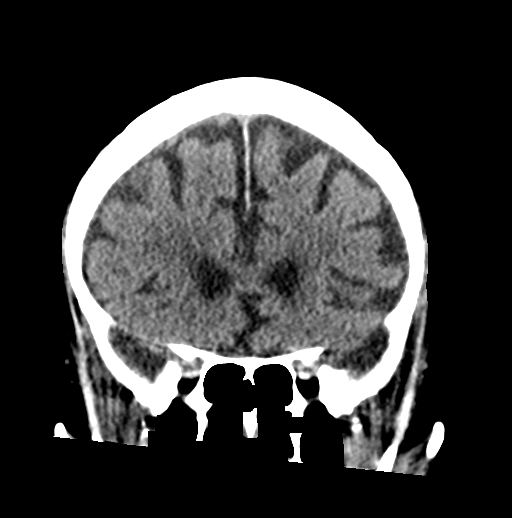
[im 30/67  brain]
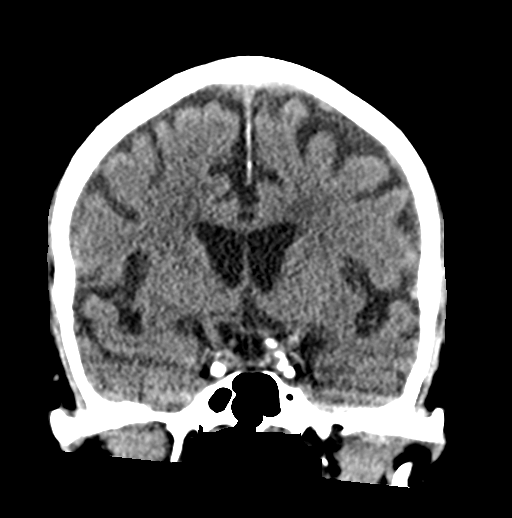
[im 37/67  brain]
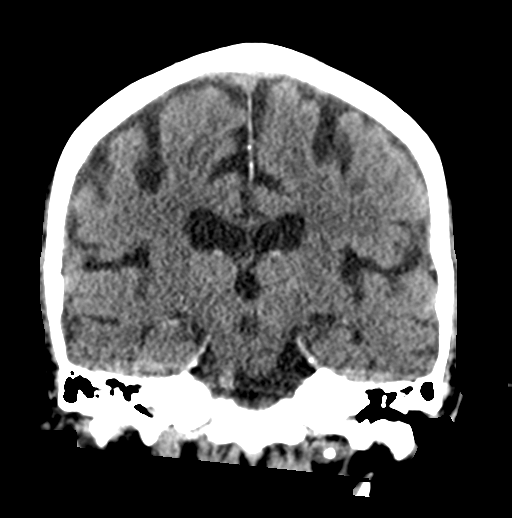

[Series 5: sagittal soft · sagittal · 0.33mm/px · 3 of 56 slices shown]
[im 19/56  brain]
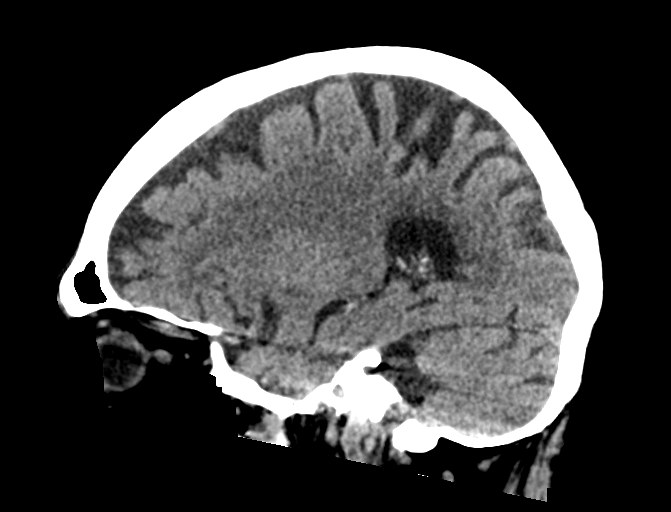
[im 28/56  brain]
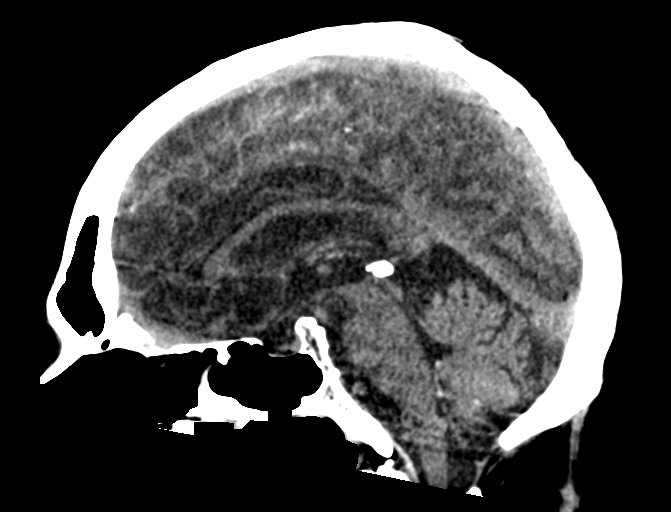
[im 37/56  brain]
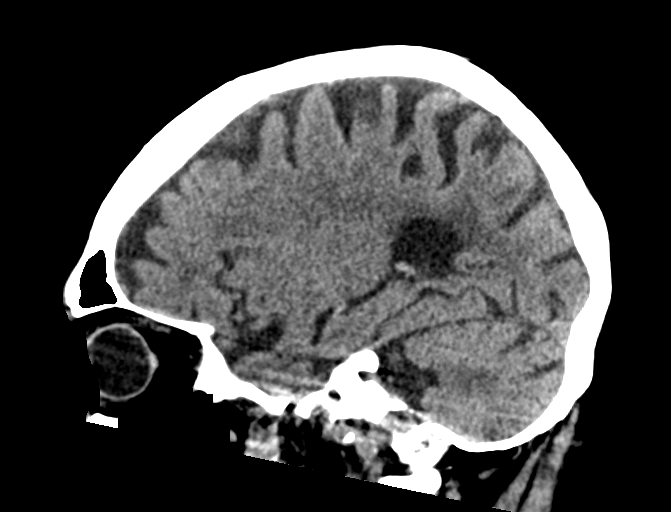

[16 of 47 positions shown; findings below may reference images not displayed]

FINDINGS: Brain: No evidence of acute infarction, hemorrhage, hydrocephalus,
extra-axial collection or mass lesion/mass effect. Stable mild
cortical volume loss.

Vascular: No hyperdense vessel or unexpected calcification.

Skull: Normal. Negative for fracture or focal lesion.

Sinuses/Orbits: No acute finding.

Other: None.
IMPRESSION: No acute intracranial abnormality.

## 2019-08-24 MED ORDER — SODIUM CHLORIDE 0.9 % IV SOLN: INTRAVENOUS | Status: DC

## 2019-08-24 MED ORDER — FOLIC ACID 1 MG PO TABS
1.0000 mg | ORAL_TABLET | Freq: Every day | ORAL | Status: DC
  Administered 2019-08-25 – 2019-09-01 (×8): 1 mg via ORAL
  Filled 2019-08-24 (×8): qty 1

## 2019-08-24 MED ORDER — ADULT MULTIVITAMIN W/MINERALS CH
1.0000 | ORAL_TABLET | Freq: Every day | ORAL | Status: DC
  Administered 2019-08-25 – 2019-09-01 (×8): 1 via ORAL
  Filled 2019-08-24 (×8): qty 1

## 2019-08-24 MED ORDER — LACTATED RINGERS IV BOLUS
500.0000 mL | Freq: Once | INTRAVENOUS | Status: AC
  Administered 2019-08-24: 11:00:00 500 mL via INTRAVENOUS

## 2019-08-24 MED ORDER — LORAZEPAM 2 MG/ML IJ SOLN: 1.0000 mg | INTRAMUSCULAR | Status: AC | PRN

## 2019-08-24 MED ORDER — ISOSORBIDE MONONITRATE ER 30 MG PO TB24
30.0000 mg | ORAL_TABLET | Freq: Every day | ORAL | Status: DC
  Administered 2019-08-25: 30 mg via ORAL
  Filled 2019-08-24 (×4): qty 1

## 2019-08-24 MED ORDER — THIAMINE HCL 100 MG PO TABS
100.0000 mg | ORAL_TABLET | Freq: Every day | ORAL | Status: DC
  Administered 2019-08-27 – 2019-09-01 (×6): 100 mg via ORAL
  Filled 2019-08-24 (×6): qty 1

## 2019-08-24 MED ORDER — NITROGLYCERIN 0.4 MG SL SUBL: 0.4000 mg | SUBLINGUAL_TABLET | SUBLINGUAL | Status: DC | PRN

## 2019-08-24 MED ORDER — CHLORDIAZEPOXIDE HCL 25 MG PO CAPS
25.0000 mg | ORAL_CAPSULE | Freq: Three times a day (TID) | ORAL | Status: DC
  Filled 2019-08-24 (×2): qty 1

## 2019-08-24 MED ORDER — LORAZEPAM 2 MG/ML IJ SOLN: 0.0000 mg | Freq: Two times a day (BID) | INTRAMUSCULAR | Status: AC

## 2019-08-24 MED ORDER — SPIRONOLACTONE 25 MG PO TABS
25.0000 mg | ORAL_TABLET | Freq: Every day | ORAL | Status: DC
  Administered 2019-08-25: 25 mg via ORAL
  Filled 2019-08-24 (×3): qty 1

## 2019-08-24 MED ORDER — METOPROLOL TARTRATE 25 MG PO TABS
25.0000 mg | ORAL_TABLET | Freq: Two times a day (BID) | ORAL | Status: DC
  Administered 2019-08-25 (×2): 25 mg via ORAL
  Filled 2019-08-24 (×2): qty 1

## 2019-08-24 MED ORDER — ONDANSETRON HCL 4 MG/2ML IJ SOLN: 4.0000 mg | Freq: Four times a day (QID) | INTRAMUSCULAR | Status: DC | PRN

## 2019-08-24 MED ORDER — LEVOFLOXACIN IN D5W 750 MG/150ML IV SOLN
750.0000 mg | INTRAVENOUS | Status: AC
  Administered 2019-08-24 – 2019-08-25 (×2): 750 mg via INTRAVENOUS
  Filled 2019-08-24 (×2): qty 150

## 2019-08-24 MED ORDER — LORAZEPAM 2 MG/ML IJ SOLN
0.0000 mg | Freq: Four times a day (QID) | INTRAMUSCULAR | Status: AC
  Administered 2019-08-24 (×2): 1 mg via INTRAVENOUS
  Administered 2019-08-25: 2 mg via INTRAVENOUS
  Filled 2019-08-24 (×3): qty 1

## 2019-08-24 MED ORDER — LORAZEPAM 1 MG PO TABS
1.0000 mg | ORAL_TABLET | ORAL | Status: AC | PRN
  Filled 2019-08-24: qty 1

## 2019-08-24 MED ORDER — THIAMINE HCL 100 MG/ML IJ SOLN
500.0000 mg | INTRAVENOUS | Status: AC
  Administered 2019-08-25 – 2019-08-26 (×2): 500 mg via INTRAVENOUS
  Filled 2019-08-24: qty 5
  Filled 2019-08-24: qty 2
  Filled 2019-08-24: qty 5

## 2019-08-24 MED ORDER — MAGNESIUM SULFATE 2 GM/50ML IV SOLN
2.0000 g | INTRAVENOUS | Status: AC
  Administered 2019-08-24: 2 g via INTRAVENOUS
  Filled 2019-08-24: qty 50

## 2019-08-24 MED ORDER — THIAMINE HCL 100 MG/ML IJ SOLN
100.0000 mg | Freq: Every day | INTRAMUSCULAR | Status: DC
  Filled 2019-08-24: qty 2

## 2019-08-24 MED ORDER — ONDANSETRON HCL 4 MG PO TABS: 4.0000 mg | ORAL_TABLET | Freq: Four times a day (QID) | ORAL | Status: DC | PRN

## 2019-08-24 MED ORDER — SODIUM CHLORIDE 0.9 % IV SOLN
1.0000 g | Freq: Once | INTRAVENOUS | Status: AC
  Administered 2019-08-24: 1 g via INTRAVENOUS
  Filled 2019-08-24: qty 10

## 2019-08-24 MED ORDER — OXYCODONE HCL 5 MG PO TABS: 2.5000 mg | ORAL_TABLET | ORAL | Status: DC | PRN

## 2019-08-24 MED ORDER — THIAMINE HCL 100 MG/ML IJ SOLN
Freq: Once | INTRAVENOUS | Status: AC
  Filled 2019-08-24: qty 1000

## 2019-08-24 MED ORDER — PANTOPRAZOLE SODIUM 40 MG IV SOLR
40.0000 mg | INTRAVENOUS | Status: DC
  Administered 2019-08-24 – 2019-08-27 (×4): 40 mg via INTRAVENOUS
  Filled 2019-08-24 (×4): qty 40
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
## 2019-08-24 NOTE — ED Notes (Signed)
In and out cath done, 100 mls removed.

## 2019-08-24 NOTE — ED Triage Notes (Signed)
Patient presents to the ED via [facility] due to weakness, unsteady when walking, with multiple falls for one week.
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
## 2019-08-25 ENCOUNTER — Encounter (HOSPITAL_COMMUNITY): Payer: Self-pay | Admitting: Family Medicine

## 2019-08-25 ENCOUNTER — Inpatient Hospital Stay (HOSPITAL_COMMUNITY): Payer: Medicare Other

## 2019-08-25 DIAGNOSIS — E8809 Other disorders of plasma-protein metabolism, not elsewhere classified: Secondary | ICD-10-CM

## 2019-08-25 DIAGNOSIS — R7989 Other specified abnormal findings of blood chemistry: Secondary | ICD-10-CM | POA: Diagnosis present

## 2019-08-25 DIAGNOSIS — R69 Illness, unspecified: Secondary | ICD-10-CM | POA: Diagnosis present

## 2019-08-25 DIAGNOSIS — I350 Nonrheumatic aortic (valve) stenosis: Secondary | ICD-10-CM

## 2019-08-25 DIAGNOSIS — Z789 Other specified health status: Secondary | ICD-10-CM

## 2019-08-25 DIAGNOSIS — K7030 Alcoholic cirrhosis of liver without ascites: Secondary | ICD-10-CM

## 2019-08-25 DIAGNOSIS — E871 Hypo-osmolality and hyponatremia: Secondary | ICD-10-CM

## 2019-08-25 DIAGNOSIS — I959 Hypotension, unspecified: Secondary | ICD-10-CM

## 2019-08-25 DIAGNOSIS — Z7189 Other specified counseling: Secondary | ICD-10-CM

## 2019-08-25 DIAGNOSIS — E538 Deficiency of other specified B group vitamins: Secondary | ICD-10-CM | POA: Diagnosis present

## 2019-08-25 DIAGNOSIS — R1310 Dysphagia, unspecified: Secondary | ICD-10-CM

## 2019-08-25 DIAGNOSIS — I4891 Unspecified atrial fibrillation: Secondary | ICD-10-CM | POA: Diagnosis present

## 2019-08-25 DIAGNOSIS — Z515 Encounter for palliative care: Secondary | ICD-10-CM

## 2019-08-25 DIAGNOSIS — F04 Amnestic disorder due to known physiological condition: Secondary | ICD-10-CM | POA: Diagnosis present

## 2019-08-25 HISTORY — DX: Other disorders of plasma-protein metabolism, not elsewhere classified: E88.09

## 2019-08-25 HISTORY — DX: Hypotension, unspecified: I95.9

## 2019-08-25 LAB — HEPATIC FUNCTION PANEL
ALT: 15 U/L (ref 0–44)
AST: 52 U/L — ABNORMAL HIGH (ref 15–41)
Albumin: 2.4 g/dL — ABNORMAL LOW (ref 3.5–5.0)
Alkaline Phosphatase: 77 U/L (ref 38–126)
Bilirubin, Direct: 1.5 mg/dL — ABNORMAL HIGH (ref 0.0–0.2)
Indirect Bilirubin: 2.4 mg/dL — ABNORMAL HIGH (ref 0.3–0.9)
Total Bilirubin: 3.9 mg/dL — ABNORMAL HIGH (ref 0.3–1.2)
Total Protein: 7.2 g/dL (ref 6.5–8.1)

## 2019-08-25 LAB — CBC WITH DIFFERENTIAL/PLATELET
Abs Immature Granulocytes: 0.32 10*3/uL — ABNORMAL HIGH (ref 0.00–0.07)
Basophils Absolute: 0.1 10*3/uL (ref 0.0–0.1)
Basophils Relative: 0 %
Eosinophils Absolute: 0 10*3/uL (ref 0.0–0.5)
Eosinophils Relative: 0 %
HCT: 29.9 % — ABNORMAL LOW (ref 39.0–52.0)
Hemoglobin: 9.8 g/dL — ABNORMAL LOW (ref 13.0–17.0)
Immature Granulocytes: 2 %
Lymphocytes Relative: 12 %
Lymphs Abs: 2.5 10*3/uL (ref 0.7–4.0)
MCH: 36.4 pg — ABNORMAL HIGH (ref 26.0–34.0)
MCHC: 32.8 g/dL (ref 30.0–36.0)
MCV: 111.2 fL — ABNORMAL HIGH (ref 80.0–100.0)
Monocytes Absolute: 0.8 10*3/uL (ref 0.1–1.0)
Monocytes Relative: 4 %
Neutro Abs: 17.1 10*3/uL — ABNORMAL HIGH (ref 1.7–7.7)
Neutrophils Relative %: 82 %
Platelets: 59 10*3/uL — ABNORMAL LOW (ref 150–400)
RBC: 2.69 MIL/uL — ABNORMAL LOW (ref 4.22–5.81)
RDW: 14.3 % (ref 11.5–15.5)
WBC: 20.8 10*3/uL — ABNORMAL HIGH (ref 4.0–10.5)
nRBC: 0.3 % — ABNORMAL HIGH (ref 0.0–0.2)

## 2019-08-25 LAB — HIV ANTIBODY (ROUTINE TESTING W REFLEX): HIV Screen 4th Generation wRfx: NONREACTIVE

## 2019-08-25 LAB — BASIC METABOLIC PANEL
Anion gap: 13 (ref 5–15)
BUN: 11 mg/dL (ref 8–23)
CO2: 23 mmol/L (ref 22–32)
Calcium: 8 mg/dL — ABNORMAL LOW (ref 8.9–10.3)
Chloride: 99 mmol/L (ref 98–111)
Creatinine, Ser: 0.75 mg/dL (ref 0.61–1.24)
GFR calc Af Amer: >60 mL/min (ref >60)
GFR calc non Af Amer: >60 mL/min (ref >60)
Glucose, Bld: 97 mg/dL (ref 70–99)
Potassium: 3.8 mmol/L (ref 3.5–5.1)
Sodium: 135 mmol/L (ref 135–145)

## 2019-08-25 LAB — PROTIME-INR
INR: 1.4 — ABNORMAL HIGH (ref 0.8–1.2)
Prothrombin Time: 16.8 seconds — ABNORMAL HIGH (ref 11.4–15.2)

## 2019-08-25 LAB — SARS CORONAVIRUS 2 (TAT 6-24 HRS): SARS Coronavirus 2: NEGATIVE

## 2019-08-25 LAB — MAGNESIUM: Magnesium: 1.7 mg/dL (ref 1.7–2.4)

## 2019-08-25 LAB — BRAIN NATRIURETIC PEPTIDE: B Natriuretic Peptide: 1264 pg/mL — ABNORMAL HIGH (ref 0.0–100.0)

## 2019-08-25 IMAGING — MR MR HEAD W/O CM
9 of 10 series · 36 of 48 positions shown · non-contrast
Comparison: Head CT yesterday

CLINICAL DATA: Ataxia and gait disturbance over the last 2 days.
Suspected stroke.

EXAM:
MRI HEAD WITHOUT CONTRAST
TECHNIQUE: Multiplanar, multiecho pulse sequences of the brain and surrounding
structures were obtained without intravenous contrast.

[Series 2: T1 · sagittal · 5.0mm · 0.41mm/px · 1 of 20 slices shown (1 of 2)]
[im 1/20]
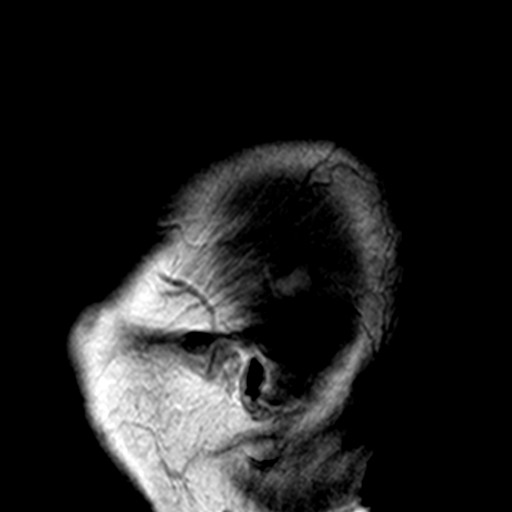

[Series 3: DWI · axial · 3.0mm · 0.73mm/px · z∈[-32,+122]mm · 7 of 54 slices shown (1 of 2)]
[im 1/54]
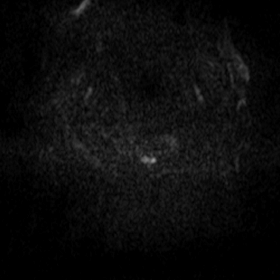
[im 9/54]
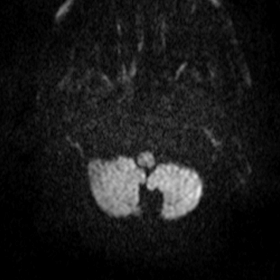
[im 18/54]
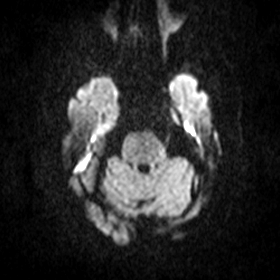
[im 27/54]
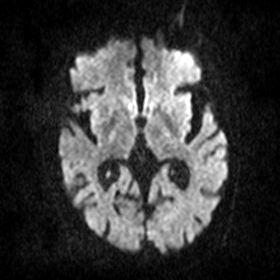
[im 36/54]
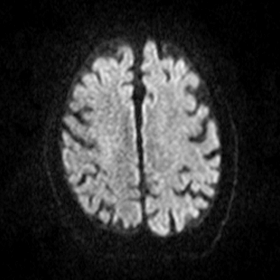
[im 45/54]
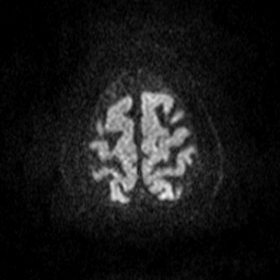
[im 54/54]
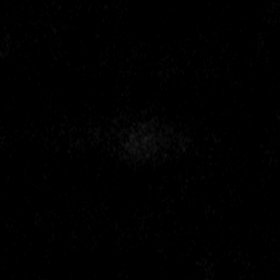

[Series 4: ax dwi_adc · axial · 3.0mm · 0.75mm/px · 1 of 55 slices shown]
[im 1/55]
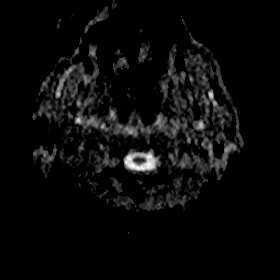

[Series 5: DWI · coronal · 5.0mm · 0.50mm/px · 4 of 34 slices shown (2 of 2)]
[im 1/34]
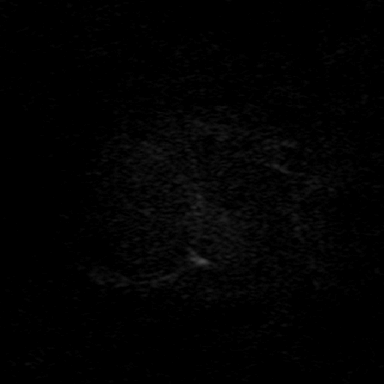
[im 12/34]
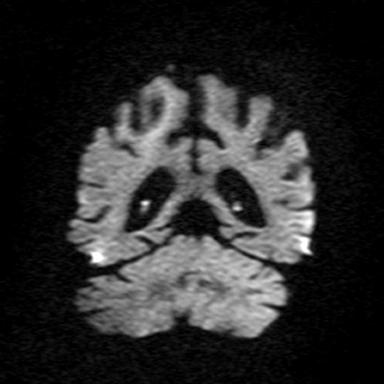
[im 23/34]
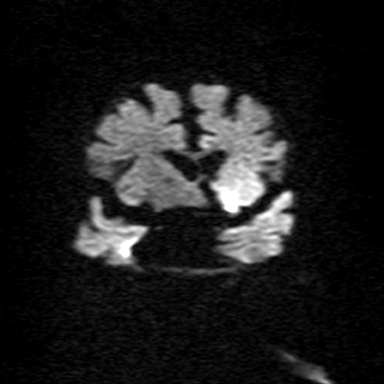
[im 34/34]
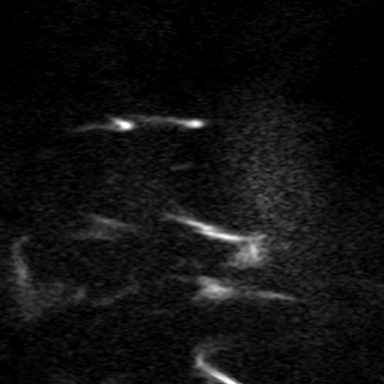

[Series 7: T2 · axial · 5.0mm · 0.75mm/px · z∈[-21,+117]mm · 3 of 23 slices shown (1 of 3)]
[im 1/23]
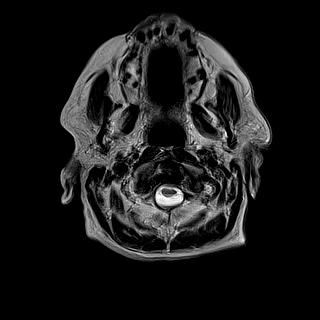
[im 12/23]
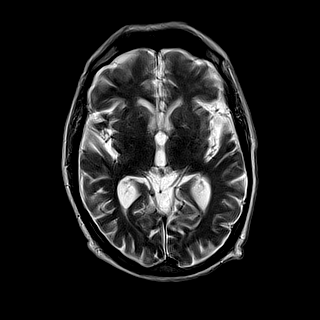
[im 23/23]
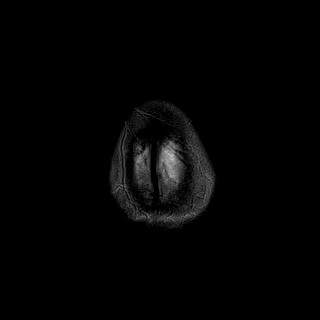

[Series 8: T2 · axial · 5.0mm · 0.45mm/px · z∈[-16,+110]mm · 3 of 21 slices shown (2 of 3)]
[im 1/21]
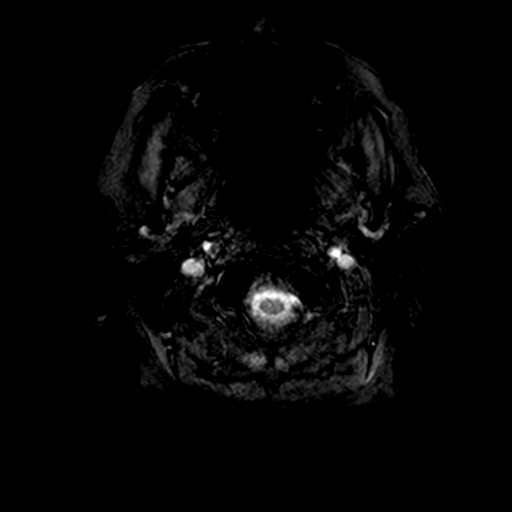
[im 11/21]
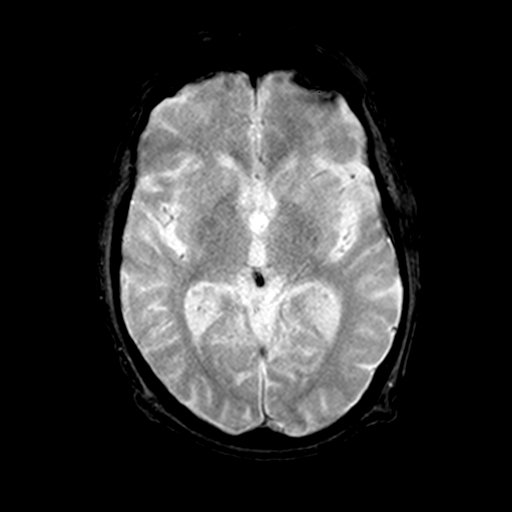
[im 21/21]
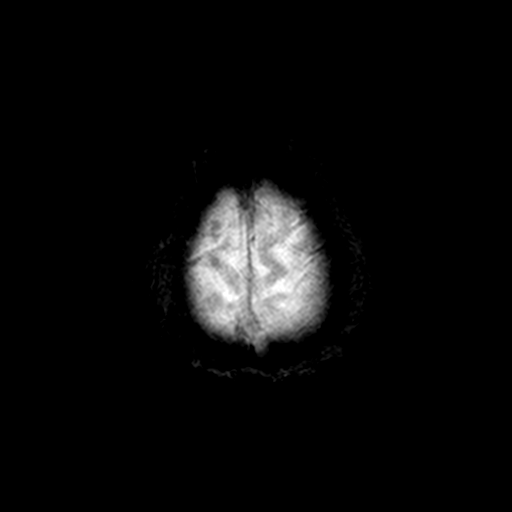

[Series 9: FLAIR · axial · 3.0mm · 0.85mm/px · z∈[-20,+114]mm · 6 of 47 slices shown]
[im 1/47]
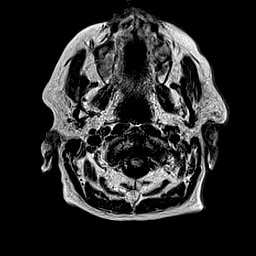
[im 10/47]
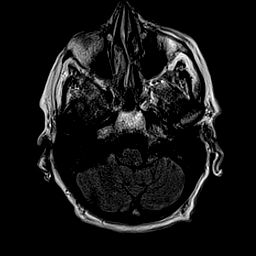
[im 19/47]
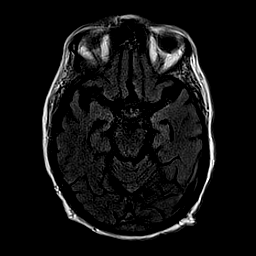
[im 28/47]
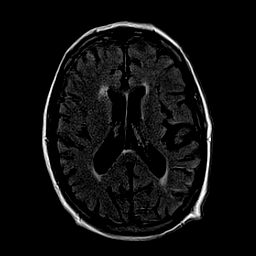
[im 37/47]
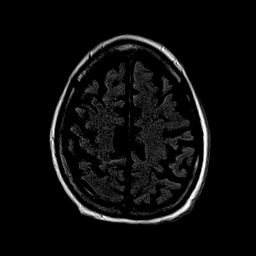
[im 47/47]
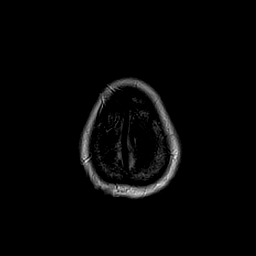

[Series 10: T1 · axial · 2.0mm · 0.42mm/px · z∈[-28,+129]mm · 8 of 82 slices shown (2 of 2)]
[im 1/82]
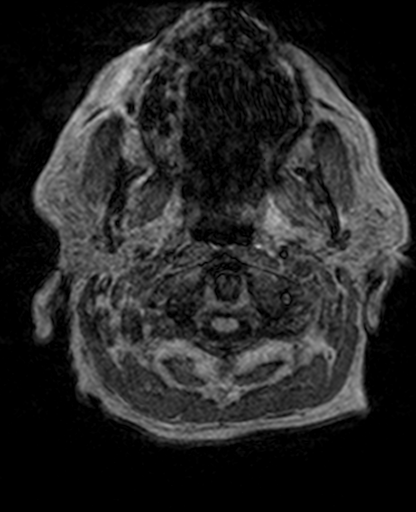
[im 10/82]
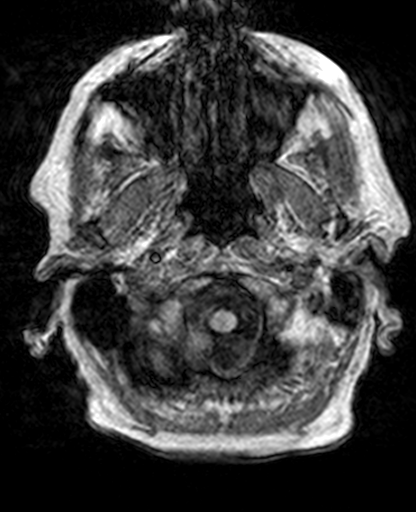
[im 28/82]
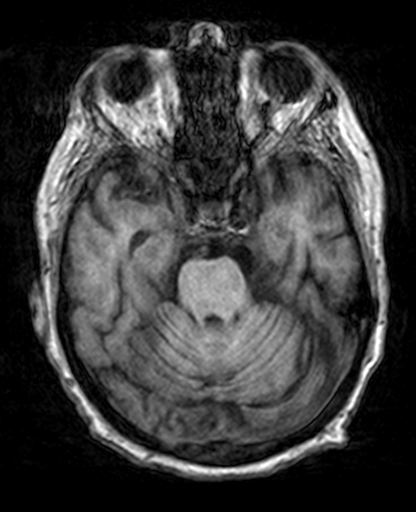
[im 37/82]
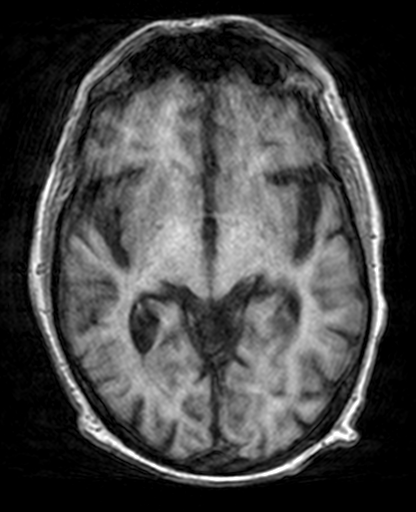
[im 46/82]
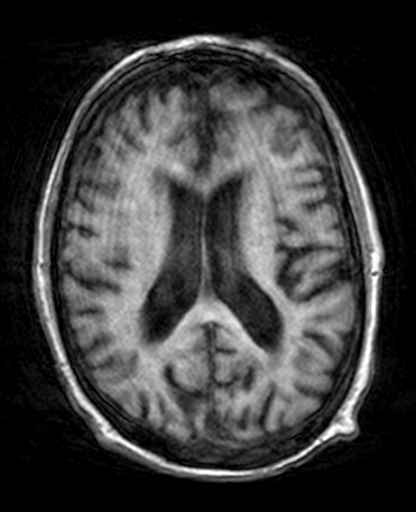
[im 55/82]
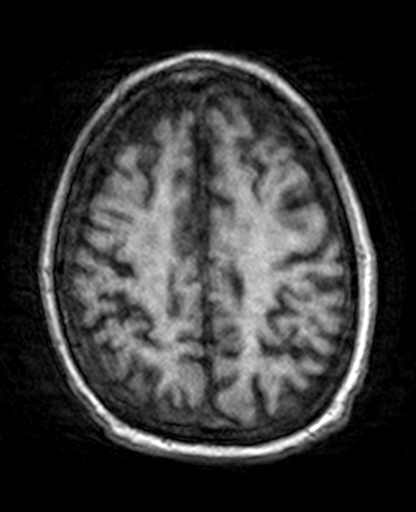
[im 73/82]
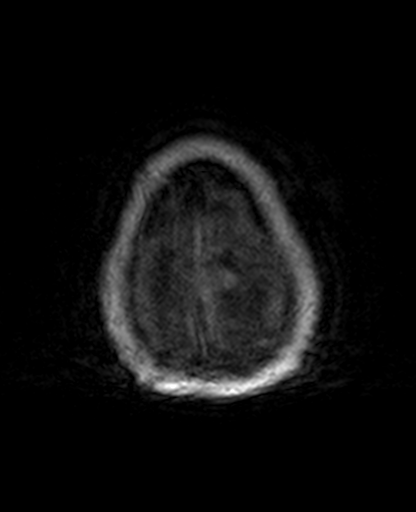
[im 82/82]
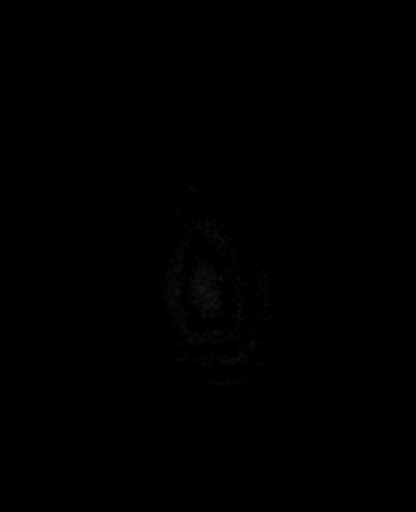

[Series 11: T2 · coronal · 5.0mm · 0.63mm/px · 3 of 28 slices shown (3 of 3)]
[im 1/28]
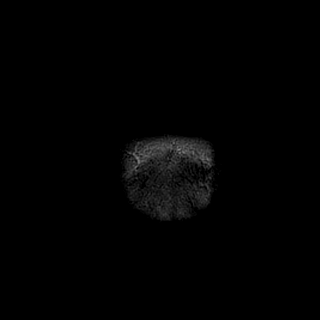
[im 14/28]
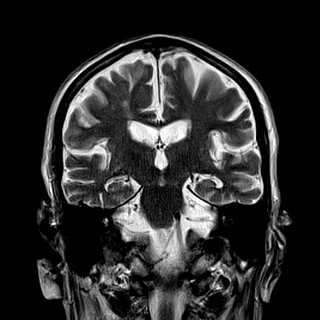
[im 28/28]
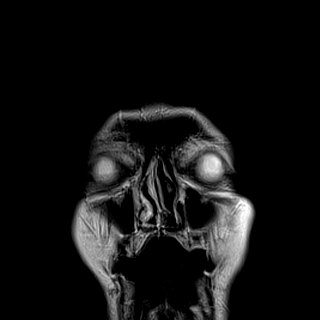

[36 of 48 positions shown; findings below may reference images not displayed]

FINDINGS: Brain: Diffusion imaging does not show any acute or subacute
infarction. Few old small vessel cerebellar infarctions. Cerebral
hemispheres show age related volume loss with mild small vessel
change of the white matter considering age. No cortical or large
vessel territory infarction. No mass lesion, hemorrhage,
hydrocephalus or extra-axial collection.

Vascular: Major vessels at the base of the brain show flow.

Skull and upper cervical spine: Negative

Sinuses/Orbits: Clear except for minimal seasonal mucosal
thickening. Orbits negative.

Other: None
IMPRESSION: No acute or reversible finding. Mild age related volume loss and
chronic small-vessel change of the cerebral hemispheric white
matter. Few old small vessel cerebellar infarctions.

## 2019-08-25 IMAGING — DX DG CHEST 1V
1 series · 1 of 1 positions shown · non-contrast
Comparison: [DATE]

CLINICAL DATA: Leukocytosis

EXAM:
PORTABLE CHEST 1 VIEW

[chest ap]
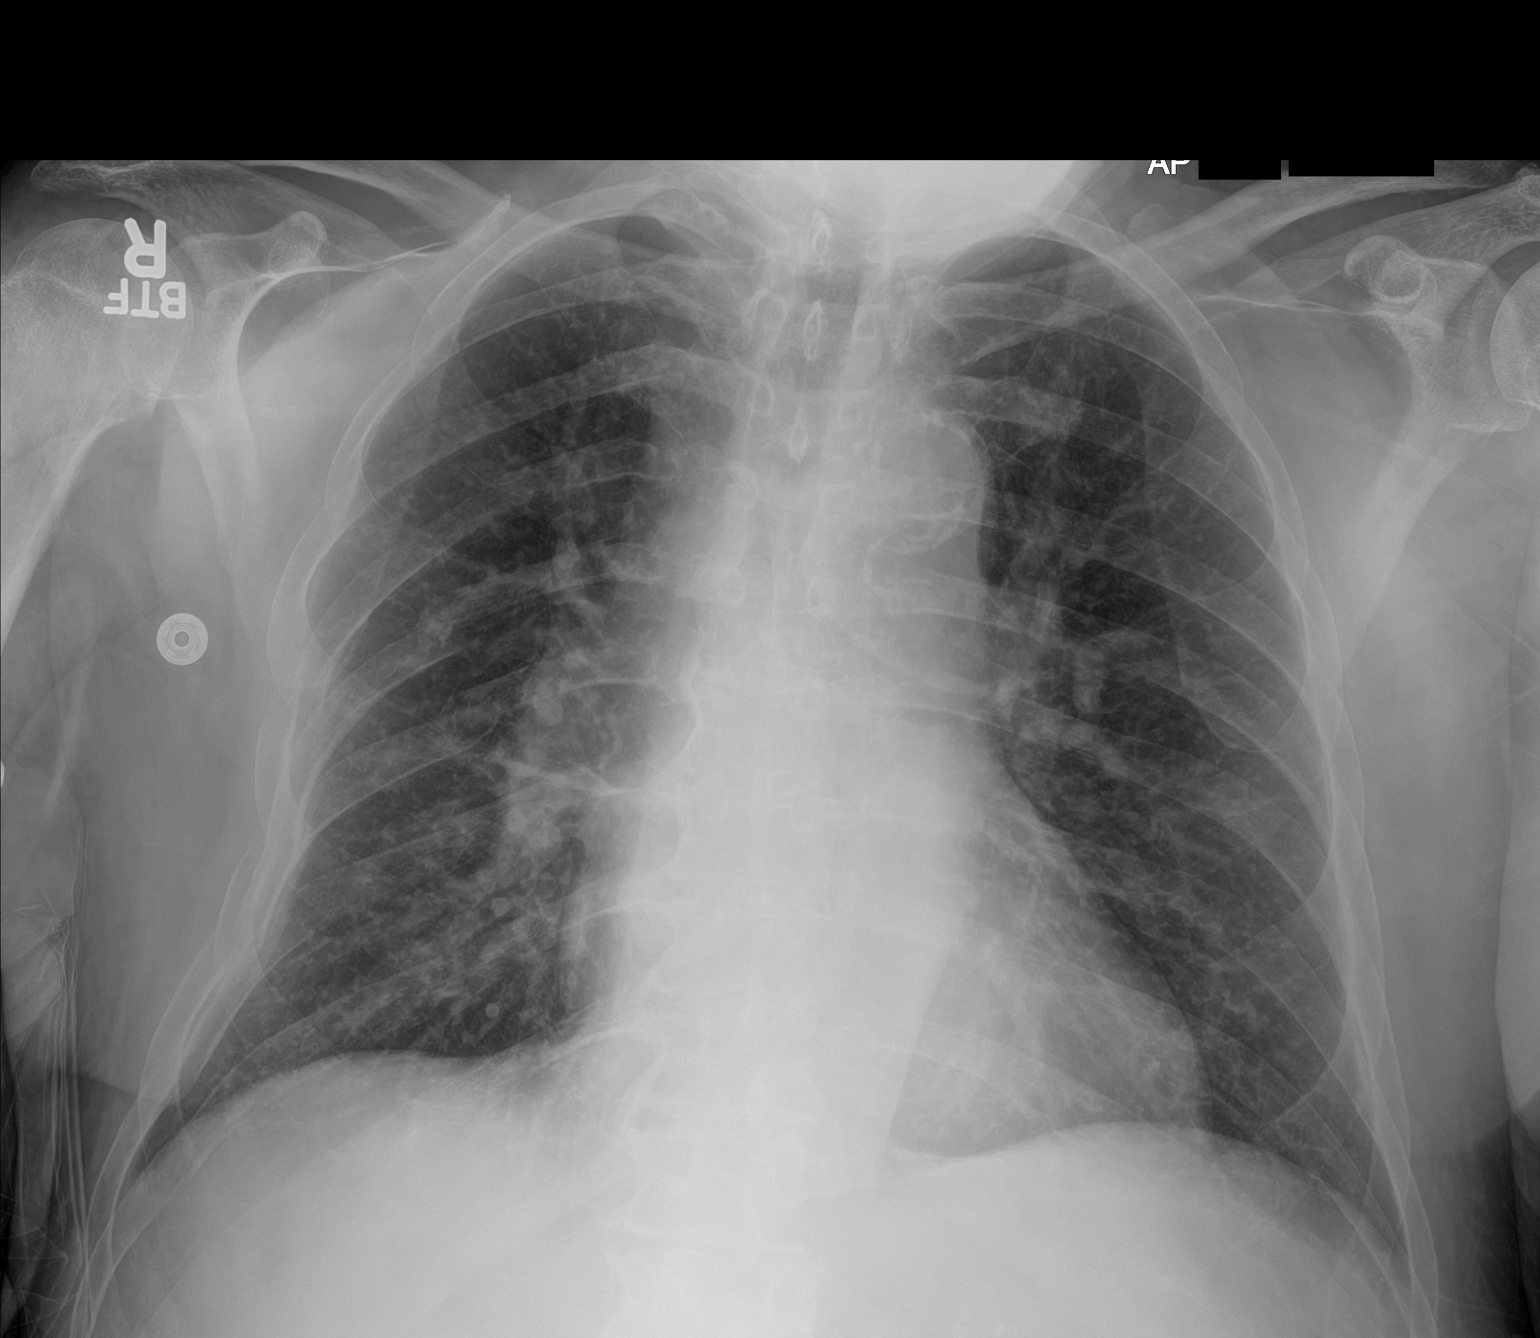

[1 of 1 positions shown; findings below may reference images not displayed]

FINDINGS: Stable mild interstitial prominence. No pleural effusion or
pneumothorax. Stable cardiomediastinal contours.
IMPRESSION: No acute process in the chest.

## 2019-08-25 MED ORDER — CHLORHEXIDINE GLUCONATE CLOTH 2 % EX PADS
6.0000 | MEDICATED_PAD | Freq: Every day | CUTANEOUS | Status: DC
  Administered 2019-08-26 – 2019-09-01 (×7): 6 via TOPICAL

## 2019-08-25 MED ORDER — INFLUENZA VAC A&B SA ADJ QUAD 0.5 ML IM PRSY
0.5000 mL | PREFILLED_SYRINGE | INTRAMUSCULAR | Status: AC
  Administered 2019-08-26: 0.5 mL via INTRAMUSCULAR
  Filled 2019-08-25: qty 0.5

## 2019-08-25 MED ORDER — METOPROLOL TARTRATE 5 MG/5ML IV SOLN
2.5000 mg | Freq: Four times a day (QID) | INTRAVENOUS | Status: DC | PRN
  Administered 2019-08-25 – 2019-08-30 (×2): 2.5 mg via INTRAVENOUS
  Filled 2019-08-25: qty 5

## 2019-08-25 MED ORDER — DILTIAZEM HCL 25 MG/5ML IV SOLN
10.0000 mg | Freq: Once | INTRAVENOUS | Status: AC
  Administered 2019-08-25: 10 mg via INTRAVENOUS
  Filled 2019-08-25: qty 5

## 2019-08-25 MED ORDER — OXYCODONE HCL 5 MG PO TABS
2.5000 mg | ORAL_TABLET | ORAL | Status: DC | PRN
  Administered 2019-08-25 – 2019-09-01 (×15): 2.5 mg via ORAL
  Filled 2019-08-25 (×15): qty 1

## 2019-08-25 MED ORDER — THIAMINE HCL 100 MG/ML IJ SOLN
INTRAMUSCULAR | Status: AC
  Filled 2019-08-25: qty 6

## 2019-08-25 MED ORDER — FUROSEMIDE 20 MG PO TABS: 20.0000 mg | ORAL_TABLET | Freq: Every day | ORAL | Status: DC

## 2019-08-25 MED ORDER — PNEUMOCOCCAL VAC POLYVALENT 25 MCG/0.5ML IJ INJ
0.5000 mL | INJECTION | INTRAMUSCULAR | Status: AC
  Administered 2019-08-26: 0.5 mL via INTRAMUSCULAR
  Filled 2019-08-25: qty 0.5

## 2019-08-25 MED ORDER — LEVALBUTEROL HCL 0.63 MG/3ML IN NEBU
INHALATION_SOLUTION | RESPIRATORY_TRACT | Status: AC
  Administered 2019-08-25: 0.63 mg
  Filled 2019-08-25: qty 3

## 2019-08-25 MED ORDER — LEVALBUTEROL HCL 0.63 MG/3ML IN NEBU: 0.6300 mg | INHALATION_SOLUTION | Freq: Four times a day (QID) | RESPIRATORY_TRACT | Status: DC | PRN

## 2019-08-25 MED ORDER — ORAL CARE MOUTH RINSE
15.0000 mL | Freq: Two times a day (BID) | OROMUCOSAL | Status: DC
  Administered 2019-08-26 – 2019-09-01 (×12): 15 mL via OROMUCOSAL

## 2019-08-25 NOTE — Progress Notes (Addendum)
PROGRESS NOTE [facility]   MATTIS FEATHERLY  YQM:[id number]  DOB: Sep 07, 1950  DOA: 08/24/2019 PCP: Celene Squibb, MD   Brief Admission Hx: y.o. male with medical history significant of many years of chronic alcohol abuse, hepatitis, alcoholic cirrhosis, coronary artery disease, peptic ulcer disease, history of GI bleeding, GERD, BPH, chronic self-catheterization, multiple hospitalizations due to chronic alcohol abuse presents to the ED today complaining of weakness and difficulty ambulating.    He was markedly ataxic.  MDM/Assessment & Plan:   1. Acute alcohol intoxication-patient was actively drinking alcohol on arrival and has been abusing alcohol for many years.  He was treated initially with gentle IV fluid hydration and withdrawal precautions started.  He has been supplemented with vitamins and folic acid and thiamine via banana bag.  Consult Education officer, museum for alcohol treatment program. 2. 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  MRI brain completed 12/28 with no acute findings.  CT brain did not show any acute findings.  PT evaluation requested. 3. UTI-he does self catheterize but reports symptoms of burning at the urethra and he has been started on IV antibiotic therapy.  Follow cultures. 4. Generalized weakness-multifactorial given his chronic comorbidities and poor self-care.  PT evaluation requested. 5. Hyponatremia-suspect may be related to mild dehydration.  Treated with IV fluids and resolved. 6. Hypomagnesemia-IV replacement has been ordered repeat magnesium level in a.m. 7. Alcoholic cirrhosis-plan to resume home oral diuretics. 8. 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  No active bleeding.  CBC in a.m.. 9. Urinary incontinence-patient self catheterizes and has been doing this for years which  we will allow him to continue while inpatient with the assistance of the RN. 10. Atrial fibrillation with RVR - patient noted to be in this while on monitor in ED, confirmed with EKG, he is already on metoprolol with soft BPs, DC imdur to give more room for titrating beta blocker, unsafe to start full anticoagulation now with chronic liver disease and severe low platelets.  Added IV lopressor 2.5 mg for HR>130, ? amio or digoxin if rate remains uncontrolled.  11. Severe protein calorie malnutrition-we will ask for dietitian consult. 12. Leukocytosis-likely secondary to urinary tract infection.    Check chest x-ray to check for aspiration pneumonia.  Follow. 13. GERD-IV Protonix ordered for GI protection. 14. History of upper GI bleeding-IV Protonix ordered as above. 15. Chronic alcoholism-the patient seems to be nearing the end stages of chronic alcohol abuse and liver cirrhosis.  His mortality is very high and I am requesting a palliative medicine consultation for goals of care discussions.  DVT prophylaxis: SCDs Code Status: Full Family Communication: updated brother by telephone Disposition Plan: Telemetry Consults called: N/A Admission status: Inpatient  Consultants:  Palliative care   Procedures:    Antimicrobials:  Ceftriaxone 12/27  Levofloxacin 12/28>  Subjective: Patient is complaining of having a headache this morning.  He has not gotten out of bed.  PT has not seen him yet.  Objective: Vitals:   08/25/19 0430 08/25/19 0530 08/25/19 0600 08/25/19 0700  BP: (!) 104/58 (!) 99/57 112/90 103/88  Pulse:    (!) 136  Resp: (!) 25 (!) 22 (!) 24 (!) 24  Temp:      TempSrc:      SpO2:    (!) 81%  Weight:      Height:  No intake or output data in the 24 hours ending 08/25/19 1125 Filed Weights   08/24/19 1029  Weight: 70.3 kg     REVIEW OF SYSTEMS  As per history otherwise all reviewed and reported negative  Exam:  General exam: Chronically ill-appearing  male he is lying in the bed he is awake and alert he does not seem to be in distress. Respiratory system: Clear. No increased work of breathing. Cardiovascular system: S1 & S2 heard. No JVD, murmurs, gallops, clicks or pedal edema. Gastrointestinal system: Abdomen is mildly distended with minimal ascites, soft and nontender. Normal bowel sounds heard. Central nervous system: Alert and oriented. No focal neurological deficits. Extremities: Trace pretibial edema bilateral lower extremities.  Data Reviewed: Basic Metabolic Panel: Recent Labs  Lab 08/24/19 1106 08/25/19 0429  NA 132* 135  K 4.0 3.8  CL 96* 99  CO2 24 23  GLUCOSE 101* 97  BUN 10 11  CREATININE 0.79 0.75  CALCIUM 8.1* 8.0*  MG 1.2* 1.7  PHOS 3.3  --    Liver Function Tests: Recent Labs  Lab 08/24/19 1106 08/25/19 0429  AST 48* 52*  ALT 16 15  ALKPHOS 96 77  BILITOT 3.1* 3.9*  PROT 8.1 7.2  ALBUMIN 2.9* 2.4*   Recent Labs  Lab 08/24/19 1106  LIPASE 31   Recent Labs  Lab 08/24/19 1201  AMMONIA 39*   CBC: Recent Labs  Lab 08/24/19 1106 08/25/19 0429  WBC 12.0* 20.8*  NEUTROABS 9.3* 17.1*  HGB 11.0* 9.8*  HCT 31.8* 29.9*  MCV 106.7* 111.2*  PLT 71* 59*   Cardiac Enzymes: No results for input(s): CKTOTAL, CKMB, CKMBINDEX, TROPONINI in the last 168 hours. CBG (last 3)  Recent Labs    08/24/19 1113  GLUCAP 94   No results found for this or any previous visit (from the past 240 hour(s)).   Studies: DG Chest 1 View  Result Date: 08/25/2019 CLINICAL DATA:  Leukocytosis EXAM: PORTABLE CHEST 1 VIEW COMPARISON:  08/24/2019 FINDINGS: Stable mild interstitial prominence. No pleural effusion or pneumothorax. Stable cardiomediastinal contours. IMPRESSION: No acute process in the chest. Electronically Signed   By: Guadlupe SpanishPraneil  Patel M.D.   On: 08/25/2019 08:42   CT Head Wo Contrast  Result Date: 08/24/2019 CLINICAL DATA:  68 year old male with weakness, gait disturbance and multiple falls over the  past week EXAM: CT HEAD WITHOUT CONTRAST TECHNIQUE: Contiguous axial images were obtained from the base of the skull through the vertex without intravenous contrast. COMPARISON:  Prior head CT 04/23/2014 FINDINGS: Brain: No evidence of acute infarction, hemorrhage, hydrocephalus, extra-axial collection or mass lesion/mass effect. Stable mild cortical volume loss. Vascular: No hyperdense vessel or unexpected calcification. Skull: Normal. Negative for fracture or focal lesion. Sinuses/Orbits: No acute finding. Other: None. IMPRESSION: No acute intracranial abnormality. Electronically Signed   By: Malachy MoanHeath  McCullough M.D.   On: 08/24/2019 13:28   MR BRAIN WO CONTRAST  Result Date: 08/25/2019 CLINICAL DATA:  Ataxia and gait disturbance over the last 2 days. Suspected stroke. EXAM: MRI HEAD WITHOUT CONTRAST TECHNIQUE: Multiplanar, multiecho pulse sequences of the brain and surrounding structures were obtained without intravenous contrast. COMPARISON:  Head CT yesterday FINDINGS: Brain: Diffusion imaging does not show any acute or subacute infarction. Few old small vessel cerebellar infarctions. Cerebral hemispheres show age related volume loss with mild small vessel change of the white matter considering age. No cortical or large vessel territory infarction. No mass lesion, hemorrhage, hydrocephalus or extra-axial collection. Vascular: Major vessels at the base of  the brain show flow. Skull and upper cervical spine: Negative Sinuses/Orbits: Clear except for minimal seasonal mucosal thickening. Orbits negative. Other: None IMPRESSION: No acute or reversible finding. Mild age related volume loss and chronic small-vessel change of the cerebral hemispheric white matter. Few old small vessel cerebellar infarctions. Electronically Signed   By: Paulina Fusi M.D.   On: 08/25/2019 08:39   DG Chest Port 1 View  Result Date: 08/24/2019 CLINICAL DATA:  68 year old presenting with generalized weakness. EXAM: PORTABLE CHEST 1  VIEW COMPARISON:  05/05/2017 and earlier. FINDINGS: Cardiac silhouette normal in size, unchanged. Lungs clear. Bronchovascular markings normal. Pulmonary vascularity normal. No visible pleural effusions. No pneumothorax. Remote RIGHT rib fractures with incomplete union again noted. No interval change. IMPRESSION: No acute cardiopulmonary disease.  Stable examination. Electronically Signed   By: Hulan Saas M.D.   On: 08/24/2019 13:29     Scheduled Meds: . chlordiazePOXIDE  25 mg Oral TID  . folic acid  1 mg Oral Daily  . isosorbide mononitrate  30 mg Oral Daily  . LORazepam  0-4 mg Intravenous Q6H   Followed by  . [START ON 08/26/2019] LORazepam  0-4 mg Intravenous Q12H  . metoprolol tartrate  25 mg Oral BID  . multivitamin with minerals  1 tablet Oral Daily  . pantoprazole (PROTONIX) IV  40 mg Intravenous Q24H  . spironolactone  25 mg Oral Daily  . [START ON 08/27/2019] thiamine  100 mg Oral Daily   Or  . [START ON 08/27/2019] thiamine  100 mg Intravenous Daily   Continuous Infusions: . sodium chloride 40 mL/hr at 08/24/19 1908  . levofloxacin (LEVAQUIN) IV Stopped (08/25/19 0554)  . thiamine injection      Principal Problem:   Ataxia Active Problems:   GLUCOSE INTOLERANCE   Alcohol abuse   Essential hypertension   CAD (coronary artery disease), native coronary artery   UTI (urinary tract infection)   Cirrhosis of liver (HCC)   Hypokalemia   Weakness generalized   Jaundice   Ascites   Hepatitis   Severe protein-calorie malnutrition (HCC)   Hyperbilirubinemia   Hyponatremia   Leukocytosis   Loss of weight   Dysphagia   Aortic stenosis   Self-catheterizes urinary bladder   Alcohol intoxication (HCC)   Thrombocytopenia (HCC)   History of GI bleed   Time spent:   Standley Dakins, MD [facility] 08/25/2019, 11:25 AM    LOS: 1 day  How to contact the [facility] Attending or Consulting provider 7A - 7P or covering provider during after hours 7P -7A, for this  patient?  1. Check the care team in [facility] and look for a) attending/consulting [facility] provider listed and b) the [facility] team listed 2. Log into [email or web address] and use 's universal password to access. If you do not have the password, please contact the hospital operator. 3. Locate the [facility] provider you are looking for under [facility] and page to a number that you can be directly reached. 4. If you still have difficulty reaching the provider, please page the [facility] (Director on Call) for the Hospitalists listed on amion for assistance.

## 2019-08-25 NOTE — Consult Note (Signed)
Consultation Note Date: 08/25/2019   Patient Name: Billy Stone  DOB: 1951-06-28  MRN: [id number]  Age / Sex: 68 y.o., male  PCP: Benita StabileHall, [name], MD Referring Physician: Cleora FleetJohnson, Clanford L, MD  Reason for Consultation: Establishing goals of care and Psychosocial/spiritual support  HPI/Patient Profile: 68 y.o. male  with past medical history of chronic alcohol abuse, hepatitis, alcoholic cirrhosis, coronary artery disease, peptic ulcer disease, history of GI bleeding, GERD, BPH, chronic self-catheterization, multiple hospitalizations due to chronic alcohol abuse presents to the ED today complaining of weakness and difficulty ambulating admitted on 08/24/2019 with acute alcohol intoxication, ataxia.   Clinical Assessment and Goals of Care: Billy Stone is resting quietly on the strecher in the ED. he makes and mostly keeps eye contact.  He is alert, able to make his basic needs known. There is no family at bedside at this time.  Billy Stone's heart rate is elevated, he is unable to fully participate in goals of care discussion at this time.  We talked about healthcare power of attorney, see below. We talked about CODE STATUS.  Billy Stone tells me he would like full scope/full code.  I ask, "for how long?".  He shares that he has not considered length of time.  I share that fundamentally, nothing has changed, we will continue to take care of him.  I shared that we will continue CODE STATUS discussions and goals of care discussions tomorrow.  Conference with attending and bedside nursing related to patient condition, needs.   HCPOA    NEXT OF KIN - brother Billy Stone     SUMMARY OF RECOMMENDATIONS   At this point, continue to treat the treatable Continue code status discussions.   Code Status/Advance Care Planning:  Full code  Symptom Management:   Per hospitalist, no additional needs at this time.     Palliative Prophylaxis:   Delirium Protocol  Additional Recommendations (Limitations, Scope, Preferences):  Full Scope Treatment  Psycho-social/Spiritual:   Desire for further Chaplaincy support:no  Additional Recommendations: Caregiving  Support/Resources and Education on Hospice  Prognosis:   Unable to determine, guarded.    Discharge Planning: to be determined, based on outcomes.       Primary Diagnoses: Present on Admission: . Alcohol abuse . Essential hypertension . CAD (coronary artery disease), native coronary artery . GLUCOSE INTOLERANCE . Self-catheterizes urinary bladder . UTI (urinary tract infection) . Alcohol intoxication (HCC) . Cirrhosis of liver (HCC) . Hypokalemia . Jaundice . Ascites . Hepatitis . Severe protein-calorie malnutrition (HCC) . Hyperbilirubinemia . Hyponatremia . Loss of weight . Leukocytosis . Thrombocytopenia (HCC) . Atrial fibrillation with RVR (HCC)   I have reviewed the medical record, interviewed the patient and family, and examined the patient. The following aspects are pertinent.  Past Medical History:  Diagnosis Date  . Alcoholic cirrhosis (HCC)   . Alcoholism (HCC)   . Allergic rhinitis   . Bronchitis   . CAD (coronary artery disease), native coronary artery    Multivessel at cardiac catheterization 1999 - managed  medically by Dr. Leonia Reeves  . Essential hypertension   . Glucose intolerance (pre-diabetes)   . History of SIADH   . History of UTI   . Hyponatremia   . Leukocytosis 06/28/2015  . Prostatic hypertrophy   . Rib fractures   . Ulcer of the stomach and intestine    Social History   Socioeconomic History  . Marital status: Divorced    Spouse name: Not on file  . Number of children: Not on file  . Years of education: Not on file  . Highest education level: Not on file  Occupational History  . Not on file  Tobacco Use  . Smoking status: Former Smoker    Packs/day: 1.00    Years: 21.00    Pack  years: 21.00    Types: Cigarettes    Start date: 11/18/1966    Quit date: 07/20/1988    Years since quitting: 31.1  . Smokeless tobacco: Never Used  Substance and Sexual Activity  . Alcohol use: Yes    Alcohol/week: 0.0 standard drinks    Comment: whiskey (not drinking beer now) about a 1/2 a fifth a day as of 09/23/18  . Drug use: No  . Sexual activity: Never  Other Topics Concern  . Not on file  Social History Narrative  . Not on file   Social Determinants of Health   Financial Resource Strain:   . Difficulty of Paying Living Expenses: Not on file  Food Insecurity:   . Worried About Charity fundraiser in the Last Year: Not on file  . Ran Out of Food in the Last Year: Not on file  Transportation Needs:   . Lack of Transportation (Medical): Not on file  . Lack of Transportation (Non-Medical): Not on file  Physical Activity:   . Days of Exercise per Week: Not on file  . Minutes of Exercise per Session: Not on file  Stress:   . Feeling of Stress : Not on file  Social Connections:   . Frequency of Communication with Friends and Family: Not on file  . Frequency of Social Gatherings with Friends and Family: Not on file  . Attends Religious Services: Not on file  . Active Member of Clubs or Organizations: Not on file  . Attends Archivist Meetings: Not on file  . Marital Status: Not on file   Family History  Problem Relation Age of Onset  . Heart disease Father   . Atrial fibrillation Mother   . Hypertension Mother   . Colon cancer Neg Hx   . Liver disease Neg Hx    Scheduled Meds: . chlordiazePOXIDE  25 mg Oral TID  . folic acid  1 mg Oral Daily  . furosemide  20 mg Oral Daily  . LORazepam  0-4 mg Intravenous Q6H   Followed by  . [START ON 08/26/2019] LORazepam  0-4 mg Intravenous Q12H  . metoprolol tartrate  25 mg Oral BID  . multivitamin with minerals  1 tablet Oral Daily  . pantoprazole (PROTONIX) IV  40 mg Intravenous Q24H  . spironolactone  25 mg  Oral Daily  . [START ON 08/27/2019] thiamine  100 mg Oral Daily   Or  . [START ON 08/27/2019] thiamine  100 mg Intravenous Daily   Continuous Infusions: . sodium chloride 10 mL/hr at 08/25/19 1526  . levofloxacin (LEVAQUIN) IV Stopped (08/25/19 0554)  . thiamine injection     PRN Meds:.LORazepam **OR** LORazepam, metoprolol tartrate, nitroGLYCERIN, ondansetron **OR** ondansetron (ZOFRAN) IV, oxyCODONE  Medications Prior to Admission:  Prior to Admission medications   Medication Sig Start Date End Date Taking? Authorizing Provider  diphenhydrAMINE (BENADRYL) 25 mg capsule Take 25 mg by mouth daily as needed for allergies.    Yes [provider]  folic acid (FOLVITE) 1 MG tablet Take 1 tablet (1 mg total) by mouth daily. 06/01/17  Yes Ralene Cork, MD  furosemide (LASIX) 20 MG tablet TAKE (1) TABLET BY MOUTH DAILY 06/19/19  Yes Jonelle Sidle, MD  isosorbide mononitrate (IMDUR) 30 MG 24 hr tablet Take 30 mg by mouth daily.   Yes [provider]  metoprolol tartrate (LOPRESSOR) 50 MG tablet TAKE ONE (1) TABLET BY MOUTH TWICE DAILY *PLEASE CONSIDER 90 DAY SUPPLIES TO PROMOTE BETTER ADHERENCE* 06/19/19  Yes Jonelle Sidle, MD  Multiple Vitamin (MULTIVITAMIN WITH MINERALS) TABS tablet Take 1 tablet by mouth daily. 03/30/16  Yes Standley Brooking, MD  pantoprazole (PROTONIX) 40 MG tablet TAKE (1) TABLET BY MOUTH DAILY 06/19/19  Yes Gelene Mink, NP  spironolactone (ALDACTONE) 50 MG tablet TAKE (1) TABLET BY MOUTH DAILY 06/19/19  Yes Jonelle Sidle, MD  vitamin B-12 (CYANOCOBALAMIN) 100 MCG tablet Take 100 mcg by mouth daily.   Yes [provider]  nitroGLYCERIN (NITROSTAT) 0.4 MG SL tablet Place 1 tablet (0.4 mg total) under the tongue every 5 (five) minutes as needed for chest pain. 03/24/19 06/22/19  Jonelle Sidle, MD   Allergies  Allergen Reactions  . Amlodipine Besylate     REACTION: Feet swell  . Penicillins Other (See Comments)    Childhood  allergy Has patient had a PCN reaction causing immediate rash, facial/tongue/throat swelling, SOB or lightheadedness with hypotension: Unknown Has patient had a PCN reaction causing severe rash involving mucus membranes or skin necrosis: Unknown Has patient had a PCN reaction that required hospitalization: Unknown Has patient had a PCN reaction occurring within the last 10 years: No If all of the above answers are "NO", then may proceed with Cephalosporin use.    Review of Systems  Unable to perform ROS: Acuity of condition    Physical Exam Vitals and nursing note reviewed.     Vital Signs: BP 97/69   Pulse (!) 110   Temp 98.6 F (37 C) (Oral)   Resp (!) 27   Ht  (1.778 m)   Wt 70.3 kg   SpO2 (!) 84%   BMI 22.24 kg/m  Pain Scale: 0-10   Pain Score: 0-No pain   SpO2: SpO2: (!) 84 % O2 Device:SpO2: (!) 84 % O2 Flow Rate: .   IO: Intake/output summary: No intake or output data in the 24 hours ending 08/25/19 1548  LBM:   Baseline Weight: Weight: 70.3 kg Most recent weight: Weight: 70.3 kg     Palliative Assessment/Data:   Flowsheet Rows     Most Recent Value  Intake Tab  Referral Department  Hospitalist  Unit at Time of Referral  Cardiac/Telemetry Unit  Palliative Care Primary Diagnosis  Other (Comment)  Date Notified  08/25/19  Palliative Care Type  New Palliative care  Reason for referral  Clarify Goals of Care  Date of Admission  08/24/19  Date first seen by Palliative Care  08/25/19  # of days Palliative referral response time  0 Day(s)  # of days IP prior to Palliative referral  1  Clinical Assessment  Palliative Performance Scale Score  40%  Pain Max last 24 hours  Not able to report  Pain Min  Last 24 hours  Not able to report  Dyspnea Max Last 24 Hours  Not able to report  Dyspnea Min Last 24 hours  Not able to report  Psychosocial & Spiritual Assessment  Palliative Care Outcomes      Time In: 1520 Time Out: 1610 Time Total: 50 minutes     Greater than 50%  of this time was spent counseling and coordinating care related to the above assessment and plan.  Signed by: Katheran Awe, NP   Please contact Palliative Medicine Team phone at [phone] for questions and concerns.  For individual provider: See Loretha Stapler

## 2019-08-25 NOTE — ED Notes (Signed)
Pt taken to MRI  

## 2019-08-25 NOTE — ED Notes (Signed)
Pt noted to be in Afib with varying rate 118-136. Sats 85 % on RA . O2 placed at 2 L. Notified hospitalist  Gave new order.  Pt is not shaky or sweaty at this time

## 2019-08-25 NOTE — Progress Notes (Signed)
Upon entering the room, telemetry box thrown in the floor. Patient appears to be agitated and climbing out of bed. Patient pulled IV out and attempting to pull second IV out. Patient alert and oriented x3. Patient stated that he is going home and he isn't staying in this place.  Patient educated on the importance of receiving care in the hospital. Patient stated that his last drink was right before he came here, he had it for breakfast. Attempted to distract patient. Lights turned down. Patient attempting to climb out of bed. Patient repositioned in bed. Bed alarm is on. Floor mats in place and bed is in lowest position.. Call bell within reach. Educated to call if he needs anything and to not get up by himself. PRN Ativan given for agitation.

## 2019-08-26 ENCOUNTER — Other Ambulatory Visit: Payer: Self-pay

## 2019-08-26 ENCOUNTER — Inpatient Hospital Stay (HOSPITAL_COMMUNITY): Payer: Medicare Other

## 2019-08-26 ENCOUNTER — Other Ambulatory Visit (HOSPITAL_COMMUNITY): Payer: Medicare Other

## 2019-08-26 DIAGNOSIS — R0602 Shortness of breath: Secondary | ICD-10-CM | POA: Diagnosis present

## 2019-08-26 DIAGNOSIS — I2510 Atherosclerotic heart disease of native coronary artery without angina pectoris: Secondary | ICD-10-CM

## 2019-08-26 DIAGNOSIS — S2241XA Multiple fractures of ribs, right side, initial encounter for closed fracture: Secondary | ICD-10-CM | POA: Diagnosis present

## 2019-08-26 DIAGNOSIS — F10129 Alcohol abuse with intoxication, unspecified: Secondary | ICD-10-CM

## 2019-08-26 DIAGNOSIS — I479 Paroxysmal tachycardia, unspecified: Secondary | ICD-10-CM

## 2019-08-26 DIAGNOSIS — L8990 Pressure ulcer of unspecified site, unspecified stage: Secondary | ICD-10-CM | POA: Diagnosis present

## 2019-08-26 DIAGNOSIS — S2241XD Multiple fractures of ribs, right side, subsequent encounter for fracture with routine healing: Secondary | ICD-10-CM

## 2019-08-26 DIAGNOSIS — S2249XA Multiple fractures of ribs, unspecified side, initial encounter for closed fracture: Secondary | ICD-10-CM | POA: Diagnosis present

## 2019-08-26 DIAGNOSIS — R270 Ataxia, unspecified: Secondary | ICD-10-CM

## 2019-08-26 DIAGNOSIS — I4891 Unspecified atrial fibrillation: Secondary | ICD-10-CM

## 2019-08-26 DIAGNOSIS — I5031 Acute diastolic (congestive) heart failure: Secondary | ICD-10-CM

## 2019-08-26 DIAGNOSIS — I361 Nonrheumatic tricuspid (valve) insufficiency: Secondary | ICD-10-CM

## 2019-08-26 DIAGNOSIS — I350 Nonrheumatic aortic (valve) stenosis: Secondary | ICD-10-CM

## 2019-08-26 DIAGNOSIS — F1020 Alcohol dependence, uncomplicated: Secondary | ICD-10-CM | POA: Diagnosis present

## 2019-08-26 DIAGNOSIS — R69 Illness, unspecified: Secondary | ICD-10-CM

## 2019-08-26 LAB — CBC WITH DIFFERENTIAL/PLATELET
Abs Immature Granulocytes: 0.12 10*3/uL — ABNORMAL HIGH (ref 0.00–0.07)
Basophils Absolute: 0.1 10*3/uL (ref 0.0–0.1)
Basophils Relative: 1 %
Eosinophils Absolute: 0.1 10*3/uL (ref 0.0–0.5)
Eosinophils Relative: 0 %
HCT: 33 % — ABNORMAL LOW (ref 39.0–52.0)
Hemoglobin: 10.7 g/dL — ABNORMAL LOW (ref 13.0–17.0)
Immature Granulocytes: 1 %
Lymphocytes Relative: 8 %
Lymphs Abs: 1.1 10*3/uL (ref 0.7–4.0)
MCH: 36.6 pg — ABNORMAL HIGH (ref 26.0–34.0)
MCHC: 32.4 g/dL (ref 30.0–36.0)
MCV: 113 fL — ABNORMAL HIGH (ref 80.0–100.0)
Monocytes Absolute: 0.8 10*3/uL (ref 0.1–1.0)
Monocytes Relative: 6 %
Neutro Abs: 10.6 10*3/uL — ABNORMAL HIGH (ref 1.7–7.7)
Neutrophils Relative %: 84 %
Platelets: 67 10*3/uL — ABNORMAL LOW (ref 150–400)
RBC: 2.92 MIL/uL — ABNORMAL LOW (ref 4.22–5.81)
RDW: 14.1 % (ref 11.5–15.5)
WBC: 12.7 10*3/uL — ABNORMAL HIGH (ref 4.0–10.5)
nRBC: 0.9 % — ABNORMAL HIGH (ref 0.0–0.2)

## 2019-08-26 LAB — BASIC METABOLIC PANEL
Anion gap: 11 (ref 5–15)
BUN: 14 mg/dL (ref 8–23)
CO2: 23 mmol/L (ref 22–32)
Calcium: 8.4 mg/dL — ABNORMAL LOW (ref 8.9–10.3)
Chloride: 99 mmol/L (ref 98–111)
Creatinine, Ser: 0.72 mg/dL (ref 0.61–1.24)
GFR calc Af Amer: >60 mL/min (ref >60)
GFR calc non Af Amer: >60 mL/min (ref >60)
Glucose, Bld: 111 mg/dL — ABNORMAL HIGH (ref 70–99)
Potassium: 4.2 mmol/L (ref 3.5–5.1)
Sodium: 133 mmol/L — ABNORMAL LOW (ref 135–145)

## 2019-08-26 LAB — PROTIME-INR
INR: 1.5 — ABNORMAL HIGH (ref 0.8–1.2)
Prothrombin Time: 17.8 seconds — ABNORMAL HIGH (ref 11.4–15.2)

## 2019-08-26 LAB — HEPATIC FUNCTION PANEL
ALT: 18 U/L (ref 0–44)
AST: 93 U/L — ABNORMAL HIGH (ref 15–41)
Albumin: 2.5 g/dL — ABNORMAL LOW (ref 3.5–5.0)
Alkaline Phosphatase: 76 U/L (ref 38–126)
Bilirubin, Direct: 1.2 mg/dL — ABNORMAL HIGH (ref 0.0–0.2)
Indirect Bilirubin: 1.3 mg/dL — ABNORMAL HIGH (ref 0.3–0.9)
Total Bilirubin: 2.5 mg/dL — ABNORMAL HIGH (ref 0.3–1.2)
Total Protein: 7.5 g/dL (ref 6.5–8.1)

## 2019-08-26 LAB — ECHOCARDIOGRAM COMPLETE
Height: 65 in
Weight: 2480 oz

## 2019-08-26 LAB — BRAIN NATRIURETIC PEPTIDE: B Natriuretic Peptide: 1815 pg/mL — ABNORMAL HIGH (ref 0.0–100.0)

## 2019-08-26 LAB — MAGNESIUM: Magnesium: 1.8 mg/dL (ref 1.7–2.4)

## 2019-08-26 IMAGING — CR DG CHEST 1V PORT
1 series · 1 of 1 positions shown · non-contrast
Comparison: [DATE], [DATE], CT chest [DATE]

CLINICAL DATA: 68-year-old male with a history shortness of breath
and wheezing

EXAM:
PORTABLE CHEST 1 VIEW

[portable]
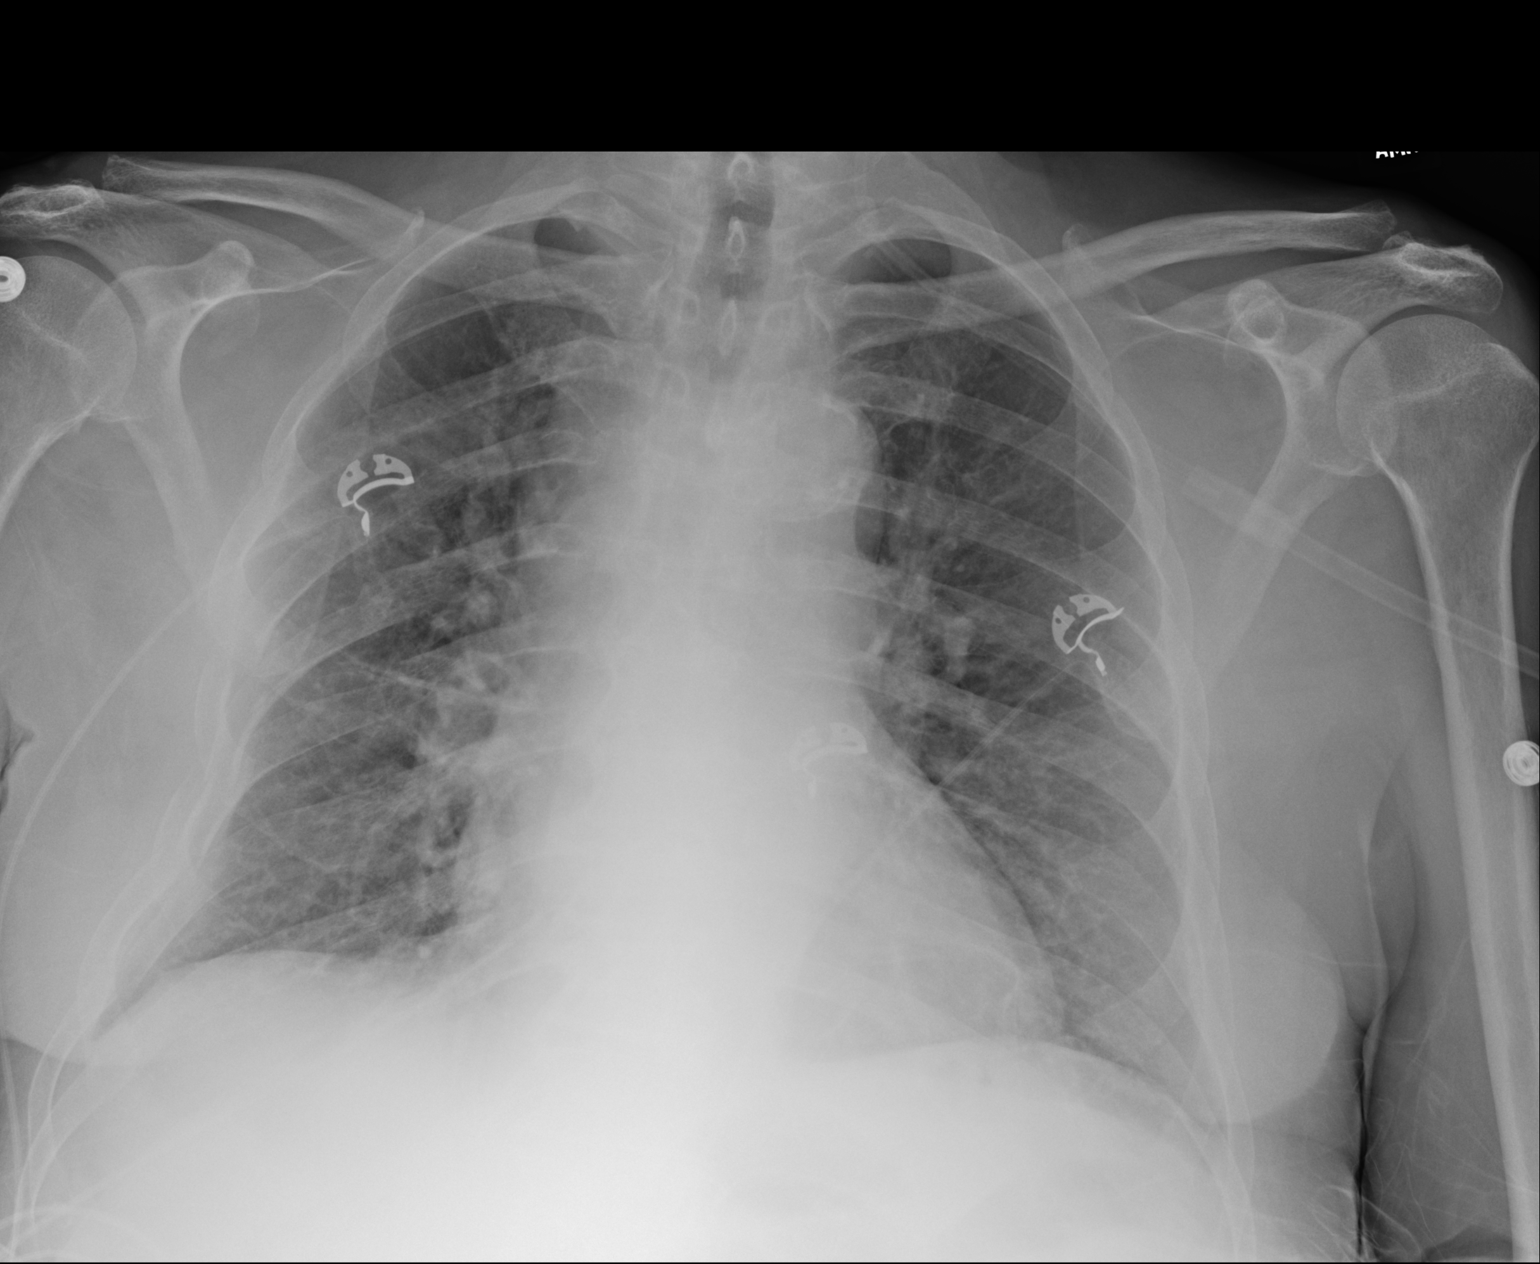

[1 of 1 positions shown; findings below may reference images not displayed]

FINDINGS: Cardiomediastinal silhouette unchanged in size and contour. Low lung
volumes with mild reticular opacities. No pneumothorax. No large
pleural effusion. No new confluent airspace disease.

The patient has sequential posterior right rib fractures which are
partially healed, in addition to new acute displaced rib fractures
at the lateral right ribs of 5, 6, 7, 8. These acute rib fractures
were present not on the prior CT but on the recent comparison chest
x-rays.

No new acute left-sided rib fractures identified.
IMPRESSION: Low lung volumes with coarsened interstitial markings, potentially
atelectasis or developing atypical infection.

The patient has new acute displaced rib fractures at the lateral
aspect of the right ribs 5-8. These were present on the most recent
comparison chest x-ray studies and are new from the CT of [[facility]], and
correlation with any recent chest trauma may be useful. In addition,
the patient has chronic nonunion fractures at the posterior right
ribs.

## 2019-08-26 MED ORDER — IPRATROPIUM-ALBUTEROL 0.5-2.5 (3) MG/3ML IN SOLN
3.0000 mL | Freq: Three times a day (TID) | RESPIRATORY_TRACT | Status: DC
  Administered 2019-08-26 – 2019-08-27 (×4): 3 mL via RESPIRATORY_TRACT
  Filled 2019-08-26 (×4): qty 3

## 2019-08-26 MED ORDER — IPRATROPIUM-ALBUTEROL 0.5-2.5 (3) MG/3ML IN SOLN: 3.0000 mL | RESPIRATORY_TRACT | Status: DC

## 2019-08-26 MED ORDER — LEVOFLOXACIN IN D5W 750 MG/150ML IV SOLN
750.0000 mg | INTRAVENOUS | Status: AC
  Administered 2019-08-26 – 2019-08-27 (×2): 750 mg via INTRAVENOUS
  Filled 2019-08-26 (×2): qty 150

## 2019-08-26 MED ORDER — FUROSEMIDE 10 MG/ML IJ SOLN
40.0000 mg | Freq: Once | INTRAMUSCULAR | Status: AC
  Administered 2019-08-26: 40 mg via INTRAVENOUS
  Filled 2019-08-26: qty 4

## 2019-08-26 MED ORDER — IPRATROPIUM-ALBUTEROL 0.5-2.5 (3) MG/3ML IN SOLN: 3.0000 mL | Freq: Four times a day (QID) | RESPIRATORY_TRACT | Status: DC

## 2019-08-26 MED ORDER — FUROSEMIDE 10 MG/ML IJ SOLN: 40.0000 mg | Freq: Once | INTRAMUSCULAR | Status: DC

## 2019-08-26 MED ORDER — METOPROLOL TARTRATE 25 MG PO TABS
37.5000 mg | ORAL_TABLET | Freq: Two times a day (BID) | ORAL | Status: DC
  Administered 2019-08-26 – 2019-08-27 (×2): 37.5 mg via ORAL
  Filled 2019-08-26 (×2): qty 2

## 2019-08-26 NOTE — [place] FL2 (Signed)
[place] MEDICAID FL2 LEVEL OF CARE SCREENING TOOL     IDENTIFICATION  Patient Name: Billy Stone Birthdate: Mar 29, 1951 Sex: male Admission Date (Current Location): 08/24/2019  [facility] and [place] Number:  [employer] and Address:  [facility],  [address] [place] [address]      Provider Number: [id number]  Attending Physician Name and Address:  Cleora Fleet, MD  Relative Name and Phone Number:       Current Level of Care: Hospital Recommended Level of Care: Skilled Nursing Facility Prior Approval Number: [id number]  Date Approved/Denied:   PASRR Number:    Discharge Plan: SNF    Current Diagnoses: Patient Active Problem List   Diagnosis Date Noted  . Pressure injury of skin 08/26/2019  . Atrial fibrillation with RVR (HCC) 08/25/2019  . Terminal illness 08/25/2019  . Hypotension - soft BPs 08/25/2019  . Elevated brain natriuretic peptide (BNP) level 08/25/2019  . Hypoalbuminemia 08/25/2019  . 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 Goals of care, counseling/discussion   . Palliative care by specialist   . DNR (do not resuscitate) discussion   . Ataxia 08/24/2019  . Self-catheterizes urinary bladder 08/24/2019  . Alcohol intoxication (HCC) 08/24/2019  . Thrombocytopenia (HCC) 08/24/2019  . History of GI bleed 08/24/2019  . Chest pain due to CAD (HCC) 04/23/2019  . Aortic stenosis 04/09/2019  . Dysphagia 09/27/2018  . Loss of weight 02/07/2018  . Constipation 06/27/2017  . GI bleed 05/05/2017  . Acute lower UTI 05/05/2017  . GIB (gastrointestinal bleeding) 05/05/2017  . Fall 03/28/2016  . Macrocytosis 10/17/2015  . Leukocytosis 06/28/2015  . Hyponatremia syndrome 12/26/2014  . Hyperglycemia 12/26/2014  . Alcoholic hepatitis with ascites   . Cirrhosis of liver (HCC) 12/14/2014  . Hypokalemia 12/14/2014  . Weakness generalized 12/14/2014  . Jaundice 12/14/2014  . Severe  protein-calorie malnutrition (HCC) 12/14/2014  . Acute liver failure 12/14/2014  . Ascites   . Hepatitis   . Hyperbilirubinemia   . Hyponatremia   . Bilateral leg edema 06/05/2014  . Arrhythmia 06/05/2014  . Rib fractures 04/23/2014  . UTI (urinary tract infection) 04/23/2014  . Deficiency anemia 10/07/2010  . Edema 04/04/2010  . HYPERTROPHY PROSTATE W/UR OBST & OTH LUTS 09/30/2009  . GLUCOSE INTOLERANCE 07/01/2009  . ALLERGIC RHINITIS 12/23/2007  . Alcohol abuse 01/13/2007  . Essential hypertension 01/05/2007  . CAD (coronary artery disease), native coronary artery 01/05/2007  . HYPONATREMIA, HX OF 01/05/2007  . SPLENECTOMY, HX OF 08/28/1984    Orientation RESPIRATION BLADDER Height & Weight     Self  O2 Continent Weight: 70.3 kg Height:  5\' 5"  (165.1 cm)  BEHAVIORAL SYMPTOMS/MOOD NEUROLOGICAL BOWEL NUTRITION STATUS      Continent Diet(heart healthy)  AMBULATORY STATUS COMMUNICATION OF NEEDS Skin   Extensive Assist Verbally Normal                       Personal Care Assistance Level of Assistance  Bathing, Dressing, Feeding Bathing Assistance: Limited assistance Feeding assistance: Independent Dressing Assistance: Limited assistance     Functional Limitations Info  Sight, Hearing, Speech Sight Info: Adequate Hearing Info: Adequate Speech Info: Adequate    SPECIAL CARE FACTORS FREQUENCY  PT (By licensed PT)     PT Frequency: 5x/week              Contractures Contractures Info: Not present    Additional Factors Info  Code Status, Allergies Code Status Info:  Full Allergies Info: Amlodipine Besylate, Penicillin           Current Medications (08/26/2019):  This is the current hospital active medication list Current Facility-Administered Medications  Medication Dose Route Frequency Provider Last Rate Last Admin  . 0.9 %  sodium chloride infusion   Intravenous Continuous Wynetta Emery, Clanford L, MD 10 mL/hr at 08/25/19 1526 Rate Change at 08/25/19  1526  . Chlorhexidine Gluconate Cloth 2 % PADS 6 each  6 each Topical Daily Johnson, Clanford L, MD   6 each at 08/26/19 1000  . folic acid (FOLVITE) tablet 1 mg  1 mg Oral Daily Johnson, Clanford L, MD   1 mg at 08/26/19 0945  . ipratropium-albuterol (DUONEB) 0.5-2.5 (3) MG/3ML nebulizer solution 3 mL  3 mL Nebulization TID Johnson, Clanford L, MD   3 mL at 08/26/19 1421  . levalbuterol (XOPENEX) nebulizer solution 0.63 mg  0.63 mg Nebulization Q6H PRN Johnson, Clanford L, MD      . levofloxacin (LEVAQUIN) IVPB 750 mg  750 mg Intravenous Q24H Johnson, Clanford L, MD      . LORazepam (ATIVAN) injection 0-4 mg  0-4 mg Intravenous Q6H Johnson, Clanford L, MD   2 mg at 08/25/19 2305   Followed by  . LORazepam (ATIVAN) injection 0-4 mg  0-4 mg Intravenous Q12H Johnson, Clanford L, MD      . LORazepam (ATIVAN) tablet 1-4 mg  1-4 mg Oral Q1H PRN Murlean Iba, MD       Or  . LORazepam (ATIVAN) injection 1-4 mg  1-4 mg Intravenous Q1H PRN Johnson, Clanford L, MD      . MEDLINE mouth rinse  15 mL Mouth Rinse BID Johnson, Clanford L, MD   15 mL at 08/26/19 1000  . metoprolol tartrate (LOPRESSOR) injection 2.5 mg  2.5 mg Intravenous Q6H PRN Johnson, Clanford L, MD   2.5 mg at 08/25/19 1549  . metoprolol tartrate (LOPRESSOR) tablet 37.5 mg  37.5 mg Oral BID Bernerd Pho M, PA-C   37.5 mg at 08/26/19 0945  . multivitamin with minerals tablet 1 tablet  1 tablet Oral Daily Wynetta Emery, Clanford L, MD   1 tablet at 08/26/19 0947  . nitroGLYCERIN (NITROSTAT) SL tablet 0.4 mg  0.4 mg Sublingual Q5 min PRN Johnson, Clanford L, MD      . ondansetron (ZOFRAN) tablet 4 mg  4 mg Oral Q6H PRN Johnson, Clanford L, MD       Or  . ondansetron (ZOFRAN) injection 4 mg  4 mg Intravenous Q6H PRN Johnson, Clanford L, MD      . oxyCODONE (Oxy IR/ROXICODONE) immediate release tablet 2.5 mg  2.5 mg Oral Q4H PRN Johnson, Clanford L, MD   2.5 mg at 08/25/19 1211  . pantoprazole (PROTONIX) injection 40 mg  40 mg Intravenous  Q24H Johnson, Clanford L, MD   40 mg at 08/25/19 1837  . [START ON 08/27/2019] thiamine tablet 100 mg  100 mg Oral Daily Johnson, Clanford L, MD       Or  . Derrill Memo ON 08/27/2019] thiamine (B-1) injection 100 mg  100 mg Intravenous Daily Johnson, Clanford L, MD      . thiamine 500mg  in normal saline (17ml) IVPB  500 mg Intravenous Q24H Johnson, Clanford L, MD 100 mL/hr at 08/25/19 2310 500 mg at 08/25/19 2310     Discharge Medications: Please see discharge summary for a list of discharge medications.  Relevant Imaging Results:  Relevant Lab Results:   Additional Information SSN [id number]  [id number]  Steve Gregg, Chrystine OilerSharley Diane, RN

## 2019-08-26 NOTE — Evaluation (Signed)
Physical Therapy Evaluation Patient Details Name: Billy Stone MRN: [id number] DOB: 08/24/1951 Today's Date: 08/26/2019   History of Present Illness  Billy Stone is a 68 y.o. male with medical history significant of many years of chronic alcohol abuse, hepatitis, alcoholic cirrhosis, coronary artery disease, peptic ulcer disease, history of GI bleeding, GERD, BPH, chronic self-catheterization, multiple hospitalizations due to chronic alcohol abuse presents to the ED today complaining of weakness and difficulty ambulating.  The patient apparently has not seen a physician in about 1 year.  He continues to drink heavily.  The patient reports progressive difficulty with ambulation balance and multiple falls and weakness.  He has a difficult time getting up off the floor after falling.  He drinks alcohol daily and reports that he is having a difficult time even walking to the bathroom from his bedroom.  He reports no head injuries.  He reports that he has had alcohol withdrawal in the past.  Patient denies recreational drug abuse.  He reports that he has lost weight and has had poor appetite and reporting dark urine.  The patient chronically self catheterizes after prostate surgery.  He had been followed by cardiology in the past but not recently.  He was brought into the emergency department by paramedics who reported that his vital signs were unremarkable on arrival but that he was severely ataxic and weak and appeared much older than stated age.  His mentation was also altered.  The patient denies headache, vision changes fever chills.  He denies nausea vomiting and diarrhea.  He does not seem to be a good historian.  He has poor short-term memory.    Clinical Impression  Patient limited for functional mobility as stated below secondary to BLE weakness, fatigue, impaired activity tolerance, and poor sitting balance. Patient has limited carry over for following verbal cueing and requires mod assist to  transition to seated EOB. Requires support to remain seated. Patient describing hallucinations throughout today's session and appears confused but is able to provide limited history. Patient wishes to return to lying in bed following sitting for several minutes. Patient will benefit from continued physical therapy in hospital and recommended venue below to increase strength, balance, endurance for safe ADLs and gait.     Follow Up Recommendations SNF    Equipment Recommendations  None recommended by PT    Recommendations for Other Services       Precautions / Restrictions Precautions Precautions: Fall Restrictions Weight Bearing Restrictions: No      Mobility  Bed Mobility Overal bed mobility: Needs Assistance Bed Mobility: Rolling;Supine to Sit;Sit to Supine Rolling: Mod assist   Supine to sit: Mod assist Sit to supine: Mod assist   General bed mobility comments: to transition to seated EOB, limited carry over with verbal cueing  Transfers                    Ambulation/Gait                Stairs            Wheelchair Mobility    Modified Rankin (Stroke Patients Only)       Balance Overall balance assessment: Needs assistance Sitting-balance support: Feet supported;No upper extremity supported Sitting balance-Leahy Scale: Poor Sitting balance - Comments: seated EOB  Pertinent Vitals/Pain Pain Assessment: No/denies pain    Home Living Family/patient expects to be discharged to:: Private residence Living Arrangements: Other relatives(lives with brother) Available Help at Discharge: Family Type of Home: House Home Access: Stairs to enter Entrance Stairs-Rails: Left Entrance Stairs-Number of Steps: 3 Home Layout: One level Home Equipment: Cane - single point;Walker - 2 wheels;Shower seat      Prior Function Level of Independence: Needs assistance   Gait / Transfers Assistance Needed:  Patient states he uses his walker and SPC to ambulate in his house for short distances  ADL's / Homemaking Assistance Needed: Family assists        Hand Dominance        Extremity/Trunk Assessment   Upper Extremity Assessment Upper Extremity Assessment: Generalized weakness    Lower Extremity Assessment Lower Extremity Assessment: Generalized weakness    Cervical / Trunk Assessment Cervical / Trunk Assessment: Kyphotic  Communication   Communication: No difficulties  Cognition Arousal/Alertness: Lethargic Behavior During Therapy: Restless;Agitated(confused) Overall Cognitive Status: Difficult to assess                                 General Comments: patient confused, describing hallucinations throughout today's session      General Comments      Exercises     Assessment/Plan    PT Assessment Patient needs continued PT services  PT Problem List Decreased strength;Decreased range of motion;Decreased activity tolerance;Decreased balance;Decreased mobility;Decreased coordination;Decreased cognition       PT Treatment Interventions DME instruction;Gait training;Stair training;Functional mobility training;Therapeutic activities;Therapeutic exercise;Balance training;Neuromuscular re-education;Patient/family education;Manual techniques;Modalities    PT Goals (Current goals can be found in the Care Plan section)  Acute Rehab PT Goals Patient Stated Goal: Return home with brother PT Goal Formulation: With patient Time For Goal Achievement: 09/09/19 Potential to Achieve Goals: Fair    Frequency Min 3X/week   Barriers to discharge        Co-evaluation               AM-PAC PT "6 Clicks" Mobility  Outcome Measure Help needed turning from your back to your side while in a flat bed without using bedrails?: A Little Help needed moving from lying on your back to sitting on the side of a flat bed without using bedrails?: A Lot Help needed moving  to and from a bed to a chair (including a wheelchair)?: A Lot Help needed standing up from a chair using your arms (e.g., wheelchair or bedside chair)?: A Lot Help needed to walk in hospital room?: A Lot Help needed climbing 3-5 steps with a railing? : Total 6 Click Score: 12    End of Session Equipment Utilized During Treatment: Oxygen Activity Tolerance: Patient limited by fatigue;Patient limited by lethargy(limited by medication) Patient left: in bed;with call bell/phone within reach;with bed alarm set Nurse Communication: Mobility status PT Visit Diagnosis: Unsteadiness on feet (R26.81);Other abnormalities of gait and mobility (R26.89);Muscle weakness (generalized) (M62.81);Repeated falls (R29.6)    Time: 7628-3151 PT Time Calculation (min) (ACUTE ONLY): 16 min   Charges:   PT Evaluation $PT Eval Low Complexity: 1 Low PT Treatments $Therapeutic Activity: 8-22 mins       9:16 AM, 08/26/19 Mearl Latin PT, DPT Physical Therapist at [facility]

## 2019-08-26 NOTE — Progress Notes (Signed)
PROGRESS NOTE [facility]   CINDY BRINDISI  NFA:[id number]  DOB: 1951-03-23  DOA: 08/24/2019 PCP: Benita Stabile, MD  Brief Admission Hx: 68 y.o. male with medical history significant of many years of chronic alcohol abuse, hepatitis, alcoholic cirrhosis, coronary artery disease, peptic ulcer disease, history of GI bleeding, GERD, BPH, chronic self-catheterization, multiple hospitalizations due to chronic alcohol abuse presents to the ED today complaining of weakness and difficulty ambulating. He was markedly ataxic.  MDM/Assessment & Plan:   1. Acute alcohol intoxication-treated: patient was actively drinking alcohol on arrival and has been abusing alcohol for many years.  He was treated initially with gentle IV fluid hydration and withdrawal precautions started.  He has been supplemented with vitamins and folic acid and thiamine via banana bag.  Consult Child psychotherapist for alcohol treatment program. 2. 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  MRI brain completed 12/28 with no acute findings.  CT brain did not show any acute findings.  PT evaluation requested. 3. Metabolic encephalopathy - Pt remains encephalopathic despite maximal medical treatments, his prognosis remains guarded to poor as he develops multiorgan failure, I have asked palliative care team to assist with goals of care discussions.   4. UTI-he does self catheterize but reports symptoms of burning at the urethra and he has been started on IV antibiotic therapy.  Follow cultures. 5. Generalized weakness-multifactorial given his chronic comorbidities and poor self-care.  PT evaluation requested. 6. Hyponatremia-suspect may be related to mild dehydration.  Treated with IV fluids and resolved. 7. Hypomagnesemia-IV replacement has been ordered repeat magnesium level in a.m. 8. Alcoholic cirrhosis-plan to resume  home oral diuretics. 9. Acute rib fractures - He has acute right rib fracture 5-8 likely contributing to his pain and suffering.  Working with family to ask them to consider a comfort care approach to ease suffering.  10. Atypical pneumonia - seen on CXR, treating with levofloxacin.  11. 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  No active bleeding but not a candidate for full anticoagulation.  12. Urinary incontinence-patient self catheterizes and has been doing this for years which we will allow him to continue while inpatient with the assistance of the RN. 13. Atrial fibrillation with RVR - back in sinus rhythm, appreciate assistance of cardiology team, Afib was confirmed with EKG, he is already on metoprolol which is slowly being titrated,  DC'd imdur to give more room for titrating beta blocker, unsafe to start full anticoagulation now with chronic liver disease and severe low platelets.  14. Severe protein calorie malnutrition-we will ask for dietitian consult. 15. Leukocytosis-likely secondary to urinary tract infection.    WBC trending down. 16. Acute respiratory distress - he is wheezing and SOB, added nebs, IV lasix given, continue palliative care discussions.  17. GERD-IV Protonix ordered for GI protection. 18. History of upper GI bleeding-IV Protonix ordered as above. 19. Chronic alcoholism-the patient seems to be nearing the end stages of chronic alcohol abuse and liver cirrhosis.  His mortality is very high and I am requesting a palliative medicine consultation for goals of care discussions.  DVT prophylaxis: SCDs Code Status: Full Family Communication: updated brother by telephone 12/28, unable to reach 12/29 Disposition Plan: Telemetry Consults called: N/A Admission status: Inpatient  Consultants:  Palliative care   Procedures:    Antimicrobials:  Ceftriaxone 12/27  Levofloxacin 12/28>  Subjective: Patient  more encephalopathic, confused, mouth breathing, audible wheezing  Objective: Vitals:   08/26/19 0544 08/26/19 0607 08/26/19 0753 08/26/19 1421  BP: 114/60     Pulse: 89     Resp: 20     Temp: 98.9 F (37.2 C)     TempSrc: Oral     SpO2:  96% 95% 97%  Weight:      Height:        Intake/Output Summary (Last 24 hours) at 08/26/2019 1436 Last data filed at 08/26/2019 2111 Gross per 24 hour  Intake 1248.83 ml  Output 850 ml  Net 398.83 ml   Filed Weights   08/24/19 1029  Weight: 70.3 kg     REVIEW OF SYSTEMS  As per history otherwise all reviewed and reported negative  Exam:  General exam: Chronically ill-appearing male he is lying in the bed he is awake and alert he does seem to be in distress.  Mouth breathing, audible wheezing.  Respiratory system: upper airway wheezing, crackles at bases. No increased work of breathing. Cardiovascular system: normal S1 & S2 heard. No JVD, murmurs, gallops, clicks or pedal edema. Gastrointestinal system: Abdomen is mildly distended with minimal ascites, soft and nontender. Normal bowel sounds heard. Central nervous system: Alert and oriented. No focal neurological deficits. Extremities: Trace pretibial edema bilateral lower extremities.  Data Reviewed: Basic Metabolic Panel: Recent Labs  Lab 08/24/19 1106 08/25/19 0429 08/26/19 0458  NA 132* 135 133*  K 4.0 3.8 4.2  CL 96* 99 99  CO2 24 23 23   GLUCOSE 101* 97 111*  BUN 10 11 14   CREATININE 0.79 0.75 0.72  CALCIUM 8.1* 8.0* 8.4*  MG 1.2* 1.7 1.8  PHOS 3.3  --   --    Liver Function Tests: Recent Labs  Lab 08/24/19 1106 08/25/19 0429 08/26/19 0458  AST 48* 52* 93*  ALT 16 15 18   ALKPHOS 96 77 76  BILITOT 3.1* 3.9* 2.5*  PROT 8.1 7.2 7.5  ALBUMIN 2.9* 2.4* 2.5*   Recent Labs  Lab 08/24/19 1106  LIPASE 31   Recent Labs  Lab 08/24/19 1201  AMMONIA 39*   CBC: Recent Labs  Lab 08/24/19 1106 08/25/19 0429 08/26/19 0458  WBC 12.0* 20.8* 12.7*  NEUTROABS  9.3* 17.1* 10.6*  HGB 11.0* 9.8* 10.7*  HCT 31.8* 29.9* 33.0*  MCV 106.7* 111.2* 113.0*  PLT 71* 59* 67*   Cardiac Enzymes: No results for input(s): CKTOTAL, CKMB, CKMBINDEX, TROPONINI in the last 168 hours. CBG (last 3)  Recent Labs    08/24/19 1113  GLUCAP 94   Recent Results (from the past 240 hour(s))  Urine culture     Status: Abnormal (Preliminary result)   Collection Time: 08/24/19 11:35 AM   Specimen: Urine, Clean Catch  Result Value Ref Range Status   Specimen Description   Final    URINE, CLEAN CATCH Performed at [facility], [address] [place], [facility] [address]    Special Requests   Final    NONE Performed at [facility], [address] [place], [facility] [address]    Culture >=100,000 COLONIES/mL ESCHERICHIA COLI (A)  Final   Report Status PENDING  Incomplete  SARS CORONAVIRUS 2 (TAT 6-24 HRS) Nasopharyngeal Nasopharyngeal Swab     Status: None   Collection Time: 08/24/19  8:36 PM   Specimen: Nasopharyngeal Swab  Result Value Ref Range Status   SARS Coronavirus 2 NEGATIVE NEGATIVE Final    Comment: (NOTE) SARS-CoV-2 target nucleic acids are NOT DETECTED. The SARS-CoV-2 RNA  is generally detectable in upper and lower respiratory specimens during the acute phase of infection. Negative results do not preclude SARS-CoV-2 infection, do not rule out co-infections with other pathogens, and should not be used as the sole basis for treatment or other patient management decisions. Negative results must be combined with clinical observations, patient history, and epidemiological information. The expected result is Negative. Fact Sheet for Patients: SugarRoll.be Fact Sheet for Healthcare Providers: https://www.woods-mathews.com/ This test is not yet approved or cleared by the [place] FDA and  has been authorized for detection and/or diagnosis of SARS-CoV-2 by FDA under an Emergency Use Authorization (EUA). This EUA  will remain  in effect (meaning this test can be used) for the duration of the COVID-19 declaration under Section 56 4(b)(1) of the Act, 21 U.S.C. section 360bbb-3(b)(1), unless the authorization is terminated or revoked sooner. Performed at [facility], [place] [address], [place], [place] [address]      Studies: DG Chest 1 View  Result Date: 08/25/2019 CLINICAL DATA:  Leukocytosis EXAM: PORTABLE CHEST 1 VIEW COMPARISON:  08/24/2019 FINDINGS: Stable mild interstitial prominence. No pleural effusion or pneumothorax. Stable cardiomediastinal contours. IMPRESSION: No acute process in the chest. Electronically Signed   By: Macy Mis M.D.   On: 08/25/2019 08:42   MR BRAIN WO CONTRAST  Result Date: 08/25/2019 CLINICAL DATA:  Ataxia and gait disturbance over the last 2 days. Suspected stroke. EXAM: MRI HEAD WITHOUT CONTRAST TECHNIQUE: Multiplanar, multiecho pulse sequences of the brain and surrounding structures were obtained without intravenous contrast. COMPARISON:  Head CT yesterday FINDINGS: Brain: Diffusion imaging does not show any acute or subacute infarction. Few old small vessel cerebellar infarctions. Cerebral hemispheres show age related volume loss with mild small vessel change of the white matter considering age. No cortical or large vessel territory infarction. No mass lesion, hemorrhage, hydrocephalus or extra-axial collection. Vascular: Major vessels at the base of the brain show flow. Skull and upper cervical spine: Negative Sinuses/Orbits: Clear except for minimal seasonal mucosal thickening. Orbits negative. Other: None IMPRESSION: No acute or reversible finding. Mild age related volume loss and chronic small-vessel change of the cerebral hemispheric white matter. Few old small vessel cerebellar infarctions. Electronically Signed   By: Nelson Chimes M.D.   On: 08/25/2019 08:39   DG CHEST PORT 1 VIEW  Result Date: 08/26/2019 CLINICAL DATA:  68 year old male with a history  shortness of breath and wheezing EXAM: PORTABLE CHEST 1 VIEW COMPARISON:  08/25/2019, 08/24/2019, CT chest 05/06/2017 FINDINGS: Cardiomediastinal silhouette unchanged in size and contour. Low lung volumes with mild reticular opacities. No pneumothorax. No large pleural effusion. No new confluent airspace disease. The patient has sequential posterior right rib fractures which are partially healed, in addition to new acute displaced rib fractures at the lateral right ribs of 5, 6, 7, 8. These acute rib fractures were present not on the prior CT but on the recent comparison chest x-rays. No new acute left-sided rib fractures identified. IMPRESSION: Low lung volumes with coarsened interstitial markings, potentially atelectasis or developing atypical infection. The patient has new acute displaced rib fractures at the lateral aspect of the right ribs 5-8. These were present on the most recent comparison chest x-ray studies and are new from the CT of 2018, and correlation with any recent chest trauma may be useful. 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 Electronically Signed   By: Corrie Mckusick D.O.   On: 08/26/2019 12:47  Scheduled Meds: . Chlorhexidine Gluconate Cloth  6 each Topical Daily  . folic acid  1 mg Oral Daily  . ipratropium-albuterol  3 mL Nebulization TID  . LORazepam  0-4 mg Intravenous Q6H   Followed by  . LORazepam  0-4 mg Intravenous Q12H  . mouth rinse  15 mL Mouth Rinse BID  . metoprolol tartrate  37.5 mg Oral BID  . multivitamin with minerals  1 tablet Oral Daily  . pantoprazole (PROTONIX) IV  40 mg Intravenous Q24H  . [START ON 08/27/2019] thiamine  100 mg Oral Daily   Or  . [START ON 08/27/2019] thiamine  100 mg Intravenous Daily   Continuous Infusions: . sodium chloride 10 mL/hr at 08/25/19 1526  . levofloxacin (LEVAQUIN) IV    . thiamine injection 500 mg (08/25/19 2310)    Principal Problem:   Ataxia Active Problems:    GLUCOSE INTOLERANCE   Alcohol abuse   Essential hypertension   CAD (coronary artery disease), native coronary artery   UTI (urinary tract infection)   Arrhythmia   Cirrhosis of liver (HCC)   Hypokalemia   Weakness generalized   Jaundice   Ascites   Hepatitis   Severe protein-calorie malnutrition (HCC)   Acute liver failure   Hyperbilirubinemia   Hyponatremia   Leukocytosis   Loss of weight   Dysphagia   Aortic stenosis   Self-catheterizes urinary bladder   Alcohol intoxication (HCC)   Thrombocytopenia (HCC)   History of GI bleed   Atrial fibrillation with RVR (HCC)   Goals of care, counseling/discussion   Palliative care by specialist   DNR (do not resuscitate) discussion   Terminal illness   Hypotension - soft BPs   Elevated brain natriuretic peptide (BNP) level   Hypoalbuminemia   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  1. Check the care team in [facility] and look for a) attending/consulting [facility] provider listed and b) the [facility] team listed 2. Log into [email or web address] and use [place]'s universal password to access. If you do not have the password, please contact the hospital operator. 3. Locate the [facility] provider you are looking for under [facility] and page to a number that you can be directly reached. 4. If you still have difficulty reaching the provider, please page the [facility] (Director on Call) for the Hospitalists listed on amion for assistance.

## 2019-08-26 NOTE — Progress Notes (Signed)
*  PRELIMINARY RESULTS* Echocardiogram 2D Echocardiogram has been performed.  Samuel Germany 08/26/2019, 4:33 PM

## 2019-08-26 NOTE — TOC Initial Note (Signed)
Transition of Care [facility]) - Initial/Assessment Note    Patient Details  Name: Billy Stone MRN: [id number] Date of Birth: Sep 23, 1950  Transition of [facility]) CM/SW Contact:    Kyrra Prada, Chauncey Reading, RN Phone Number: 08/26/2019, 2:41 PM  Clinical Narrative:  Ataxia, ETOH. Recommended for SNF. Patient alert to self only, unable to make decision regarding SNF. Unable to reach patient's brother, per palliative note, brother wishes for patient to get to the point where he can make his own decision.   Patient has a Passr number, FL2 done to prepare for SNF referral if patient agrees.               Expected Discharge Plan: Skilled Nursing Facility Barriers to Discharge: Continued Medical Work up   Patient Goals and CMS Choice Patient states their goals for this hospitalization and ongoing recovery are:: brother wants patient to be able to make his own decisions      Expected Discharge Plan and Services Expected Discharge Plan: [place]   Discharge Planning Services: CM Consult   Living arrangements for the past 2 months: Single Family Home                                      Prior Living Arrangements/Services Living arrangements for the past 2 months: Single Family Home Lives with:: Relatives                   Activities of Daily Living Home Assistive Devices/Equipment: Cane (specify quad or straight), Walker (specify type) ADL Screening (condition at time of admission) Patient's cognitive ability adequate to safely complete daily activities?: Yes Is the patient deaf or have difficulty hearing?: No Does the patient have difficulty seeing, even when wearing glasses/contacts?: No Does the patient have difficulty concentrating, remembering, or making decisions?: Yes Patient able to express need for assistance with ADLs?: Yes Does the patient have difficulty dressing or bathing?: Yes Independently performs ADLs?: No Communication:  Independent Dressing (OT): Needs assistance Is this a change from baseline?: Pre-admission baseline Grooming: Independent Feeding: Independent Bathing: Needs assistance Is this a change from baseline?: Pre-admission baseline Toileting: Needs assistance Is this a change from baseline?: Pre-admission baseline In/Out Bed: Needs assistance Is this a change from baseline?: Pre-admission baseline Walks in Home: Independent with device (comment), Needs assistance Is this a change from baseline?: Pre-admission baseline Does the patient have difficulty walking or climbing stairs?: Yes Weakness of Legs: Both Weakness of Arms/Hands: Both  Permission Sought/Granted                  Emotional Assessment              Admission diagnosis:  Hypomagnesemia [E83.42] Alcoholism ([place]) [F10.20] Ataxia [R27.0] Hyponatremia [E87.1] Leukocytosis [D72.829] Atrial fibrillation with RVR ([place]) [U54.27] Alcoholic cirrhosis, unspecified whether ascites present ([place]) [K70.30] Patient Active Problem List   Diagnosis Date Noted  . Pressure injury of skin 08/26/2019  . Atrial fibrillation with RVR ([place]) 08/25/2019  . Terminal illness 08/25/2019  . Hypotension - soft BPs 08/25/2019  . Elevated brain natriuretic peptide (BNP) level 08/25/2019  . Hypoalbuminemia 08/25/2019  . 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 Goals of care, counseling/discussion   . Palliative care by specialist   . DNR (do not resuscitate) discussion   . Ataxia 08/24/2019  . Self-catheterizes urinary bladder 08/24/2019  . Alcohol intoxication ([place]) 08/24/2019  .  Thrombocytopenia (HCC) 08/24/2019  . History of GI bleed 08/24/2019  . Chest pain due to CAD (HCC) 04/23/2019  . Aortic stenosis 04/09/2019  . Dysphagia 09/27/2018  . Loss of weight 02/07/2018  . Constipation 06/27/2017  . GI bleed 05/05/2017  . Acute lower UTI 05/05/2017  . GIB (gastrointestinal bleeding) 05/05/2017   . Fall 03/28/2016  . Macrocytosis 10/17/2015  . Leukocytosis 06/28/2015  . Hyponatremia syndrome 12/26/2014  . Hyperglycemia 12/26/2014  . Alcoholic hepatitis with ascites   . Cirrhosis of liver (HCC) 12/14/2014  . Hypokalemia 12/14/2014  . Weakness generalized 12/14/2014  . Jaundice 12/14/2014  . Severe protein-calorie malnutrition (HCC) 12/14/2014  . Acute liver failure 12/14/2014  . Ascites   . Hepatitis   . Hyperbilirubinemia   . Hyponatremia   . Bilateral leg edema 06/05/2014  . Arrhythmia 06/05/2014  . Rib fractures 04/23/2014  . UTI (urinary tract infection) 04/23/2014  . Deficiency anemia 10/07/2010  . Edema 04/04/2010  . HYPERTROPHY PROSTATE W/UR OBST & OTH LUTS 09/30/2009  . GLUCOSE INTOLERANCE 07/01/2009  . ALLERGIC RHINITIS 12/23/2007  . Alcohol abuse 01/13/2007  . Essential hypertension 01/05/2007  . CAD (coronary artery disease), native coronary artery 01/05/2007  . HYPONATREMIA, HX OF 01/05/2007  . 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 [place] [address] Phone: [phone] Fax: [phone]  [facility] - [place], [place] - [address] [place] [address] Phone: [phone] Fax: [phone]     Social Determinants of Health (SDOH) Interventions    Readmission Risk Interventions No flowsheet data found.

## 2019-08-26 NOTE — Progress Notes (Addendum)
Palliative: Billy Stone is lying quietly in bed.  He continues to appear acutely/chronically ill and frail.  He wakes after I call his name and touch his arm.  Initially, he does not make eye contact, but instead is looking toward the ceiling.  He is oriented to self only, but able to make his basic needs known.  There is no family at bedside at this time.  Billy Stone is unable to tell me what brought him to the hospital.  I offer him some water, and he is barely able to draw on the straw.  He is clearly still acutely ill.  PT evaluation recommends rehab/SNF.    Call to brother, Billy Stone at [phone].  We talked in detail about [place] acute and chronic health concerns.  I share that I expect Billy Stone to be hospitalized for at least several more days, and that he has been qualified for rehab.  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 We talked about healthcare power of attorney, Billy Stone states that their mother is alive, and Billy Stone has a daughter who he would involve both in decision-making. We talked about CODE STATUS.  Billy Stone states at this point full scope/full code.  I ask "how long for life support?"  Billy Stone states that at this point he is unsure.  I encouraged him to consider these options.  We talked about visiting hours, I encourage Billy Stone to come visit. Billy Stone tells me that his goal is for Billy Stone to get to where he can reason, make his own decisions.  Plan: Continue to treat the treatable. Continue full scope/full code.     79 minutes [name], NP Palliative Medicine Team Team Phone # [phone] Greater than 50% of this time was spent counseling and coordinating care related to the above assessment and plan.

## 2019-08-26 NOTE — Plan of Care (Signed)
  Problem: Acute Rehab PT Goals(only PT should resolve) Goal: Pt will Roll Supine to Side Outcome: Progressing Flowsheets (Taken 08/26/2019 0918) Pt will Roll Supine to Side: with supervision Goal: Pt Will Go Supine/Side To Sit Outcome: Progressing Flowsheets (Taken 08/26/2019 0918) Pt will go Supine/Side to Sit: with minimal assist Goal: Pt Will Go Sit To Supine/Side Outcome: Progressing Flowsheets (Taken 08/26/2019 0918) Pt will go Sit to Supine/Side: with minimal assist Goal: Patient Will Transfer Sit To/From Stand Outcome: Progressing Flowsheets (Taken 08/26/2019 0918) Patient will transfer sit to/from stand: with minimal assist Goal: Pt Will Transfer Bed To Chair/Chair To Bed Outcome: Progressing Flowsheets (Taken 08/26/2019 0918) Pt will Transfer Bed to Chair/Chair to Bed: with mod assist Goal: Pt Will Ambulate Outcome: Progressing Flowsheets (Taken 08/26/2019 0918) Pt will Ambulate:  25 feet  with minimal assist  with least restrictive assistive device   9:19 AM, 08/26/19 Mearl Latin PT, DPT Physical Therapist at [facility]
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
## 2019-08-27 ENCOUNTER — Other Ambulatory Visit (HOSPITAL_COMMUNITY): Payer: Medicare Other

## 2019-08-27 LAB — CBC WITH DIFFERENTIAL/PLATELET
Abs Immature Granulocytes: 0.06 10*3/uL (ref 0.00–0.07)
Basophils Absolute: 0 10*3/uL (ref 0.0–0.1)
Basophils Relative: 0 %
Eosinophils Absolute: 0 10*3/uL (ref 0.0–0.5)
Eosinophils Relative: 0 %
HCT: 29.9 % — ABNORMAL LOW (ref 39.0–52.0)
Hemoglobin: 9.8 g/dL — ABNORMAL LOW (ref 13.0–17.0)
Immature Granulocytes: 1 %
Lymphocytes Relative: 18 %
Lymphs Abs: 2 10*3/uL (ref 0.7–4.0)
MCH: 36.6 pg — ABNORMAL HIGH (ref 26.0–34.0)
MCHC: 32.8 g/dL (ref 30.0–36.0)
MCV: 111.6 fL — ABNORMAL HIGH (ref 80.0–100.0)
Monocytes Absolute: 1.2 10*3/uL — ABNORMAL HIGH (ref 0.1–1.0)
Monocytes Relative: 11 %
Neutro Abs: 7.7 10*3/uL (ref 1.7–7.7)
Neutrophils Relative %: 70 %
Platelets: 84 10*3/uL — ABNORMAL LOW (ref 150–400)
RBC: 2.68 MIL/uL — ABNORMAL LOW (ref 4.22–5.81)
RDW: 14.5 % (ref 11.5–15.5)
WBC: 11 10*3/uL — ABNORMAL HIGH (ref 4.0–10.5)
nRBC: 0.6 % — ABNORMAL HIGH (ref 0.0–0.2)

## 2019-08-27 LAB — HEPATIC FUNCTION PANEL
ALT: 18 U/L (ref 0–44)
AST: 72 U/L — ABNORMAL HIGH (ref 15–41)
Albumin: 2.1 g/dL — ABNORMAL LOW (ref 3.5–5.0)
Alkaline Phosphatase: 58 U/L (ref 38–126)
Bilirubin, Direct: 0.8 mg/dL — ABNORMAL HIGH (ref 0.0–0.2)
Indirect Bilirubin: 1.2 mg/dL — ABNORMAL HIGH (ref 0.3–0.9)
Total Bilirubin: 2 mg/dL — ABNORMAL HIGH (ref 0.3–1.2)
Total Protein: 6.7 g/dL (ref 6.5–8.1)

## 2019-08-27 LAB — BASIC METABOLIC PANEL
Anion gap: 11 (ref 5–15)
BUN: 16 mg/dL (ref 8–23)
CO2: 25 mmol/L (ref 22–32)
Calcium: 8.4 mg/dL — ABNORMAL LOW (ref 8.9–10.3)
Chloride: 100 mmol/L (ref 98–111)
Creatinine, Ser: 0.75 mg/dL (ref 0.61–1.24)
GFR calc Af Amer: >60 mL/min (ref >60)
GFR calc non Af Amer: >60 mL/min (ref >60)
Glucose, Bld: 107 mg/dL — ABNORMAL HIGH (ref 70–99)
Potassium: 3.8 mmol/L (ref 3.5–5.1)
Sodium: 136 mmol/L (ref 135–145)

## 2019-08-27 LAB — PROTIME-INR
INR: 1.6 — ABNORMAL HIGH (ref 0.8–1.2)
Prothrombin Time: 18.7 seconds — ABNORMAL HIGH (ref 11.4–15.2)

## 2019-08-27 LAB — URINE CULTURE: Culture: >=100000 — AB

## 2019-08-27 LAB — MAGNESIUM: Magnesium: 1.5 mg/dL — ABNORMAL LOW (ref 1.7–2.4)

## 2019-08-27 MED ORDER — MAGNESIUM SULFATE 2 GM/50ML IV SOLN
2.0000 g | Freq: Once | INTRAVENOUS | Status: AC
  Administered 2019-08-27: 2 g via INTRAVENOUS
  Filled 2019-08-27: qty 50

## 2019-08-27 MED ORDER — DIGOXIN 125 MCG PO TABS
0.2500 mg | ORAL_TABLET | Freq: Four times a day (QID) | ORAL | Status: AC
  Administered 2019-08-27 – 2019-08-28 (×4): 0.25 mg via ORAL
  Filled 2019-08-27 (×4): qty 2

## 2019-08-27 MED ORDER — POTASSIUM CHLORIDE 10 MEQ/100ML IV SOLN
10.0000 meq | INTRAVENOUS | Status: AC
  Administered 2019-08-27 (×4): 10 meq via INTRAVENOUS
  Filled 2019-08-27 (×4): qty 100

## 2019-08-27 MED ORDER — DILTIAZEM HCL 25 MG/5ML IV SOLN
5.0000 mg | Freq: Once | INTRAVENOUS | Status: AC
  Administered 2019-08-27: 5 mg via INTRAVENOUS
  Filled 2019-08-27: qty 5

## 2019-08-27 MED ORDER — LACTATED RINGERS IV BOLUS
1000.0000 mL | Freq: Once | INTRAVENOUS | Status: AC
  Administered 2019-08-27: 1000 mL via INTRAVENOUS

## 2019-08-27 MED ORDER — SODIUM CHLORIDE 0.9 % IV BOLUS
1000.0000 mL | Freq: Once | INTRAVENOUS | Status: AC
  Administered 2019-08-27: 1000 mL via INTRAVENOUS

## 2019-08-27 NOTE — Progress Notes (Signed)
Physical Therapy Treatment Patient Details Name: Billy Stone MRN: [id number] DOB: 09-Feb-1951 Today's Date: 08/27/2019    History of Present Illness VI WHITESEL is a 68 y.o. male with medical history significant of many years of chronic alcohol abuse, hepatitis, alcoholic cirrhosis, coronary artery disease, peptic ulcer disease, history of GI bleeding, GERD, BPH, chronic self-catheterization, multiple hospitalizations due to chronic alcohol abuse presents to the ED today complaining of weakness and difficulty ambulating.  The patient apparently has not seen a physician in about 1 year.  He continues to drink heavily.  The patient reports progressive difficulty with ambulation balance and multiple falls and weakness.  He has a difficult time getting up off the floor after falling.  He drinks alcohol daily and reports that he is having a difficult time even walking to the bathroom from his bedroom.  He reports no head injuries.  He reports that he has had alcohol withdrawal in the past.  Patient denies recreational drug abuse.  He reports that he has lost weight and has had poor appetite and reporting dark urine.  The patient chronically self catheterizes after prostate surgery.  He had been followed by cardiology in the past but not recently.  He was brought into the emergency department by paramedics who reported that his vital signs were unremarkable on arrival but that he was severely ataxic and weak and appeared much older than stated age.  His mentation was also altered.  The patient denies headache, vision changes fever chills.  He denies nausea vomiting and diarrhea.  He does not seem to be a good historian.  He has poor short-term memory.    PT Comments    Patient demonstrates increased endurance for functional mobility and ambulation, presents alert and able to use BUE to help sit up at bedside, occasional leaning backwards while completing BLE exercises while seated at bedside, unsteady  on feet during ambulation and limited to room due to c/o fatigue, on room air with SpO2 at 90-91% when walking and completing exercises.   Patient tolerated sitting up in chair after therapy on room air with SpO2 at 95-97%, heart rate did increase up to 140-150's after completing activities - RN notified.  Patient will benefit from continued physical therapy in hospital and recommended venue below to increase strength, balance, endurance for safe ADLs and gait.   Follow Up Recommendations  SNF;Supervision for mobility/OOB;Supervision - Intermittent     Equipment Recommendations  None recommended by PT    Recommendations for Other Services       Precautions / Restrictions Precautions Precautions: Fall Restrictions Weight Bearing Restrictions: No    Mobility  Bed Mobility Overal bed mobility: Needs Assistance Bed Mobility: Supine to Sit     Supine to sit: Min assist     General bed mobility comments: required verbal/tactile cueing to prop up on elbows during supine to sitting with good carryover  Transfers Overall transfer level: Needs assistance Equipment used: Rolling walker (2 wheeled) Transfers: Sit to/from [facility] Sit to Stand: Min assist;Mod assist Stand pivot transfers: Min assist       General transfer comment: had difficulty with sit to stands due to BLE weakness, after verbal/tactile cueing demonstrated some improvement  Ambulation/Gait Ambulation/Gait assistance: Min assist;Mod assist Gait Distance (Feet): 18 Feet Assistive device: Rolling walker (2 wheeled) Gait Pattern/deviations: Decreased step length - right;Decreased step length - left;Decreased stride length Gait velocity: decreased   General Gait Details: slow labored unsteady cadence with very kyphotic trunk, no  loss of balance, limited to ambulation in room due to c/o fatigue   Stairs             Wheelchair Mobility    Modified Rankin (Stroke Patients Only)        Balance Overall balance assessment: Needs assistance Sitting-balance support: Feet supported;Bilateral upper extremity supported Sitting balance-Leahy Scale: Fair Sitting balance - Comments: seated EOB, occasional leaning backwards, has to support self with BUE to maintain sitting balance while completing exercises Postural control: Posterior lean Standing balance support: During functional activity;Bilateral upper extremity supported Standing balance-Leahy Scale: Fair Standing balance comment: using RW                            Cognition Arousal/Alertness: Awake/alert Behavior During Therapy: WFL for tasks assessed/performed Overall Cognitive Status: Within Functional Limits for tasks assessed                                        Exercises General Exercises - Lower Extremity Long Arc Quad: Seated;AROM;Strengthening;Both;10 reps Hip Flexion/Marching: Seated;AROM;Strengthening;Both;10 reps Toe Raises: Seated;AROM;Strengthening;Both;10 reps Heel Raises: Seated;AROM;Strengthening;Both;10 reps    General Comments        Pertinent Vitals/Pain Pain Assessment: No/denies pain    Home Living                      Prior Function            PT Goals (current goals can now be found in the care plan section) Acute Rehab PT Goals Patient Stated Goal: Return home with brother PT Goal Formulation: With patient Time For Goal Achievement: 09/09/19 Potential to Achieve Goals: Fair Progress towards PT goals: Progressing toward goals    Frequency    Min 3X/week      PT Plan Current plan remains appropriate    Co-evaluation              AM-PAC PT "6 Clicks" Mobility   Outcome Measure  Help needed turning from your back to your side while in a flat bed without using bedrails?: A Little Help needed moving from lying on your back to sitting on the side of a flat bed without using bedrails?: A Little Help needed moving to and from a  bed to a chair (including a wheelchair)?: A Little Help needed standing up from a chair using your arms (e.g., wheelchair or bedside chair)?: A Lot Help needed to walk in hospital room?: A Lot Help needed climbing 3-5 steps with a railing? : A Lot 6 Click Score: 15    End of Session   Activity Tolerance: Patient limited by fatigue;Patient tolerated treatment well Patient left: in chair;with call bell/phone within reach Nurse Communication: Mobility status PT Visit Diagnosis: Unsteadiness on feet (R26.81);Other abnormalities of gait and mobility (R26.89);Muscle weakness (generalized) (M62.81);Repeated falls (R29.6)     Time: 8280-0349 PT Time Calculation (min) (ACUTE ONLY): 27 min  Charges:  $Therapeutic Exercise: 8-22 mins $Therapeutic Activity: 8-22 mins                     3:51 PM, 08/27/19 Ocie Bob, MPT Physical Therapist with [facility] [phone] office [phone] mobile phone

## 2019-08-27 NOTE — Progress Notes (Signed)
PROGRESS NOTE [facility]  Billy Stone  WUJ:[id number]  DOB: Apr 20, 1951  DOA: 08/24/2019 PCP: Benita Stabile, MD  Brief Admission Hx: 68 y.o. male with medical history significant of many years of chronic alcohol abuse, hepatitis, alcoholic cirrhosis, coronary artery disease, peptic ulcer disease, history of GI bleeding, GERD, BPH, chronic self-catheterization, multiple hospitalizations due to chronic alcohol abuse presents to the ED today complaining of weakness and difficulty ambulating. He was markedly ataxic.  MDM/Assessment & Plan:   1. Acute alcohol intoxication-treated: patient was actively drinking alcohol on arrival and has been abusing alcohol for many years.  He was treated initially with gentle IV fluid hydration and withdrawal precautions started.  He has been supplemented with vitamins and folic acid and thiamine via banana bag.  Consult Child psychotherapist for alcohol treatment program. 2. 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  MRI brain completed 12/28 with no acute findings.  CT brain did not show any acute findings.  PT evaluation recommending SNf placement.  Pt interested on [facility].   3. Metabolic encephalopathy - this has been intermittent, his prognosis remains guarded to poor as he develops multiorgan failure, I have asked palliative care team to assist with goals of care discussions.   4. UTI-he does self catheterize but reports symptoms of burning at the urethra and he has been started on IV antibiotic therapy.  Follow cultures. 5. Generalized weakness-multifactorial given his chronic comorbidities and poor self-care.  PT evaluation requested. 6. Hyponatremia-suspect may be related to mild dehydration.  Treated with IV fluids and resolved. 7. Hypomagnesemia-IV replacement has been ordered repeat magnesium level in a.m. 8. Alcoholic cirrhosis-plan to  resume home oral diuretics. 9. Acute rib fractures - He has acute right rib fracture 5-8 likely contributing to his pain and suffering.  Working with family to ask them to consider a comfort care approach to ease suffering.  10. Atypical pneumonia - seen on CXR, treated with levofloxacin.  11. 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  No active bleeding but not a candidate for full anticoagulation.  12. Urinary incontinence-patient self catheterizes and has been doing this for years which we will allow him to continue while inpatient with the assistance of the RN. 13. Atrial fibrillation with RVR - went into RVR this morning, given small dose of IV cardizem due to hypotension,  DC'd imdur to give more room for titrating beta blocker, unsafe to start full anticoagulation now with chronic liver disease and severe low platelets.  14. Severe protein calorie malnutrition-we will ask for dietitian consult. 15. Leukocytosis-likely secondary to urinary tract infection.    WBC trending down. 16. Acute respiratory distress - he is wheezing and SOB, added nebs, IV lasix given, continue palliative care discussions.  17. GERD-IV Protonix ordered for GI protection. 18. History of upper GI bleeding-IV Protonix ordered as above. 19. Chronic alcoholism-the patient seems to be nearing the end stages of chronic alcohol abuse and liver cirrhosis.  His mortality is very high and I am requesting a palliative medicine consultation for goals of care discussions.  DVT prophylaxis: SCDs Code Status: Full Family Communication: updated brother by telephone 12/28, unable to reach 12/29 Disposition Plan: Telemetry Consults called: N/A Admission status: Inpatient  Consultants:  Palliative care   Procedures:    Antimicrobials:  Ceftriaxone 12/27  Levofloxacin 12/28>  Subjective: Patient is much less confused.  Objective: Vitals:   08/27/19 1100  08/27/19 1258 08/27/19 1408 08/27/19 1442  BP:    130/69  Pulse: 98 92  (!) 114  Resp:    18  Temp:    98.6 F (37 C)  TempSrc:    Oral  SpO2:   98% 98%  Weight:      Height:        Intake/Output Summary (Last 24 hours) at 08/27/2019 1517 Last data filed at 08/27/2019 0300 Gross per 24 hour  Intake 1241 ml  Output 200 ml  Net 1041 ml   Filed Weights   08/24/19 1029 08/27/19 0500  Weight: 70.3 kg 69.8 kg     REVIEW OF SYSTEMS  As per history otherwise all reviewed and reported negative  Exam:  General exam: Chronically ill-appearing male he is lying in the bed he is awake and alert he does seem to be in distress.  Mouth breathing, audible wheezing.  Respiratory system: upper airway wheezing, crackles at bases. No increased work of breathing. Cardiovascular system: normal S1 & S2 heard. No JVD, murmurs, gallops, clicks or pedal edema. Gastrointestinal system: Abdomen is mildly distended with minimal ascites, soft and nontender. Normal bowel sounds heard. Central nervous system: Alert and oriented. No focal neurological deficits. Extremities: Trace pretibial edema bilateral lower extremities.  Data Reviewed: Basic Metabolic Panel: Recent Labs  Lab 08/24/19 1106 08/25/19 0429 08/26/19 0458 08/27/19 0640  NA 132* 135 133* 136  K 4.0 3.8 4.2 3.8  CL 96* 99 99 100  CO2 GLUCOSE 101* 97 111* 107*  BUN CREATININE 0.79 0.75 0.72 0.75  CALCIUM 8.1* 8.0* 8.4* 8.4*  MG 1.2* 1.7 1.8 1.5*  PHOS 3.3  --   --   --    Liver Function Tests: Recent Labs  Lab 08/24/19 1106 08/25/19 0429 08/26/19 0458 08/27/19 0640  AST 48* 52* 93* 72*  ALT ALKPHOS 96 77 76 58  BILITOT 3.1* 3.9* 2.5* 2.0*  PROT 8.1 7.2 7.5 6.7  ALBUMIN 2.9* 2.4* 2.5* 2.1*   Recent Labs  Lab 08/24/19 1106  LIPASE 31   Recent Labs  Lab 08/24/19 1201  AMMONIA 39*   CBC: Recent Labs  Lab 08/24/19 1106 08/25/19 0429 08/26/19 0458 08/27/19 0640  WBC  12.0* 20.8* 12.7* 11.0*  NEUTROABS 9.3* 17.1* 10.6* 7.7  HGB 11.0* 9.8* 10.7* 9.8*  HCT 31.8* 29.9* 33.0* 29.9*  MCV 106.7* 111.2* 113.0* 111.6*  PLT 71* 59* 67* 84*   Cardiac Enzymes: No results for input(s): CKTOTAL, CKMB, CKMBINDEX, TROPONINI in the last 168 hours. CBG (last 3)  No results for input(s): GLUCAP in the last 72 hours. Recent Results (from the past 240 hour(s))  Urine culture     Status: Abnormal   Collection Time: 08/24/19 11:35 AM   Specimen: Urine, Clean Catch  Result Value Ref Range Status   Specimen Description   Final    URINE, CLEAN CATCH Performed at [facility], [address], [place], [place] [address]    Special Requests   Final    NONE Performed at [facility], [address] [place], [place] [address]    Culture >=100,000 COLONIES/mL ESCHERICHIA COLI (A)  Final   Report Status 08/27/2019 FINAL  Final   Organism ID, Bacteria ESCHERICHIA COLI (A)  Final      Susceptibility   Escherichia coli - MIC*    AMPICILLIN 4 SENSITIVE Sensitive  CEFAZOLIN <=4 SENSITIVE Sensitive     CEFTRIAXONE <=0.25 SENSITIVE Sensitive     CIPROFLOXACIN <=0.25 SENSITIVE Sensitive     GENTAMICIN <=1 SENSITIVE Sensitive     IMIPENEM <=0.25 SENSITIVE Sensitive     NITROFURANTOIN <=16 SENSITIVE Sensitive     TRIMETH/SULFA <=20 SENSITIVE Sensitive     AMPICILLIN/SULBACTAM <=2 SENSITIVE Sensitive     PIP/TAZO <=4 SENSITIVE Sensitive     * >=100,000 COLONIES/mL ESCHERICHIA COLI  SARS CORONAVIRUS 2 (TAT 6-24 HRS) Nasopharyngeal Nasopharyngeal Swab     Status: None   Collection Time: 08/24/19  8:36 PM   Specimen: Nasopharyngeal Swab  Result Value Ref Range Status   SARS Coronavirus 2 NEGATIVE NEGATIVE Final    Comment: (NOTE) SARS-CoV-2 target nucleic acids are NOT DETECTED. The SARS-CoV-2 RNA is generally detectable in upper and lower respiratory specimens during the acute phase of infection. Negative results do not preclude SARS-CoV-2 infection, do not rule  out co-infections with other pathogens, and should not be used as the sole basis for treatment or other patient management decisions. Negative results must be combined with clinical observations, patient history, and epidemiological information. The expected result is Negative. Fact Sheet for Patients: HairSlick.no Fact Sheet for Healthcare Providers: quierodirigir.com This test is not yet approved or cleared by the [place] FDA and  has been authorized for detection and/or diagnosis of SARS-CoV-2 by FDA under an Emergency Use Authorization (EUA). This EUA will remain  in effect (meaning this test can be used) for the duration of the COVID-19 declaration under Section 56 4(b)(1) of the Act, 21 U.S.C. section 360bbb-3(b)(1), unless the authorization is terminated or revoked sooner. Performed at [facility], [address], [place], [place] [address]      Studies: DG CHEST PORT 1 VIEW  Result Date: 08/26/2019 CLINICAL DATA:  68 year old male with a history shortness of breath and wheezing EXAM: PORTABLE CHEST 1 VIEW COMPARISON:  08/25/2019, 08/24/2019, CT chest 05/06/2017 FINDINGS: Cardiomediastinal silhouette unchanged in size and contour. Low lung volumes with mild reticular opacities. No pneumothorax. No large pleural effusion. No new confluent airspace disease. The patient has sequential posterior right rib fractures which are partially healed, in addition to new acute displaced rib fractures at the lateral right ribs of 5, 6, 7, 8. These acute rib fractures were present not on the prior CT but on the recent comparison chest x-rays. No new acute left-sided rib fractures identified. IMPRESSION: Low lung volumes with coarsened interstitial markings, potentially atelectasis or developing atypical infection. The patient has new acute displaced rib fractures at the lateral aspect of the right ribs 5-8. These were present on the  most recent comparison chest x-ray studies and are new from the CT of 2018, and correlation with any recent chest trauma may be useful. 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 Electronically Signed   By: Gilmer Mor D.O.   On: 08/26/2019 12:47   ECHOCARDIOGRAM COMPLETE  Result Date: 08/26/2019   ECHOCARDIOGRAM REPORT   Patient Name:   Billy Stone Date of Exam: 08/26/2019 Medical Rec #:  [id number]       Height:       65.0 in Accession #:    [id number]      Weight:       155.0 lb Date of Birth:  1951/03/27       BSA:          1.78 m Patient Age:    89 years  BP:           114/60 mmHg Patient Gender: M               HR:           89 bpm. Exam Location:  [facility] Procedure: 2D Echo, Cardiac Doppler and Color Doppler Indications:    Aortic Stenosis 424.1 / 135.0  History:        Patient has prior history of Echocardiogram examinations, most                 recent 08/20/2017. CAD, Arrythmias:Atrial Fibrillation; Risk                 Factors:Hypertension. Acute liver failure,Alcohol abuse,.  Sonographer:    Alvino Chapel RCS Referring Phys: [id number] Erma Heritage  Sonographer Comments: Patient could NOT follow commands to "sniff". IMPRESSIONS  1. Left ventricular ejection fraction, by visual estimation, is 55 to 60%. The left ventricle has normal function. There is no left ventricular hypertrophy.  2. The left ventricle has no regional wall motion abnormalities.  3. Global right ventricle has normal systolic function.The right ventricular size is normal. No increase in right ventricular wall thickness.  4. Left atrial size was normal.  5. Right atrial size was normal.  6. Moderate mitral annular calcification.  7. Moderate thickening of the mitral valve leaflet(s).  8. The mitral valve is normal in structure. Trivial mitral valve regurgitation. No evidence of mitral stenosis.  9. The tricuspid valve is normal in structure. 10. The aortic valve has an  indeterminant number of cusps. Aortic valve regurgitation is not visualized. Mild to moderate aortic valve stenosis. 11. The pulmonic valve was not well visualized. Pulmonic valve regurgitation is not visualized. 12. Mildly elevated pulmonary artery systolic pressure. FINDINGS  Left Ventricle: Left ventricular ejection fraction, by visual estimation, is 55 to 60%. The left ventricle has normal function. The left ventricle has no regional wall motion abnormalities. There is no left ventricular hypertrophy. Left ventricular diastolic parameters were normal. Right Ventricle: The right ventricular size is normal. No increase in right ventricular wall thickness. Global RV systolic function is has normal systolic function. The tricuspid regurgitant velocity is 2.56 m/s, and with an assumed right atrial pressure  of 10 mmHg, the estimated right ventricular systolic pressure is mildly elevated at 36.2 mmHg. Left Atrium: Left atrial size was normal in size. Right Atrium: Right atrial size was normal in size Pericardium: There is no evidence of pericardial effusion. Mitral Valve: The mitral valve is normal in structure. There is moderate thickening of the mitral valve leaflet(s). Moderate mitral annular calcification. Trivial mitral valve regurgitation. No evidence of mitral valve stenosis by observation. Tricuspid Valve: The tricuspid valve is normal in structure. Tricuspid valve regurgitation is mild. Aortic Valve: The aortic valve has an indeterminant number of cusps. . There is moderate thickening and moderate calcification of the aortic valve. Aortic valve regurgitation is not visualized. Mild to moderate aortic stenosis is present. Moderate aortic  valve annular calcification. There is moderate thickening of the aortic valve. There is moderate calcification of the aortic valve. Aortic valve mean gradient measures 13.0 mmHg. Aortic valve peak gradient measures 24.4 mmHg. Aortic valve area, by VTI measures 1.06 cm.  Pulmonic Valve: The pulmonic valve was not well visualized. Pulmonic valve regurgitation is not visualized. Pulmonic regurgitation is not visualized. No evidence of pulmonic stenosis. Aorta: The aortic root is normal in size and structure. Pulmonary Artery: There is moderate pulmonary hypertension,  the PASP is 41 mmHg. IAS/Shunts: No atrial level shunt detected by color flow Doppler.  LEFT VENTRICLE PLAX 2D LVIDd:         3.77 cm       Diastology LVIDs:         2.68 cm       LV e' lateral:   11.20 cm/s LV PW:         0.93 cm       LV E/e' lateral: 8.0 LV IVS:        1.10 cm       LV e' medial:    8.05 cm/s LVOT diam:     1.80 cm       LV E/e' medial:  11.2 LV SV:         34 ml LV SV Index:   18.94 LVOT Area:     2.54 cm  LV Volumes (MOD) LV area d, A2C:    26.20 cm LV area d, A4C:    23.50 cm LV area s, A2C:    12.10 cm LV area s, A4C:    12.40 cm LV major d, A2C:   8.02 cm LV major d, A4C:   7.51 cm LV major s, A2C:   6.38 cm LV major s, A4C:   6.37 cm LV vol d, MOD A2C: 71.6 ml LV vol d, MOD A4C: 60.4 ml LV vol s, MOD A2C: 20.0 ml LV vol s, MOD A4C: 20.3 ml LV SV MOD A2C:     51.6 ml LV SV MOD A4C:     60.4 ml LV SV MOD BP:      47.7 ml RIGHT VENTRICLE RV S prime:     13.40 cm/s TAPSE (M-mode): 1.8 cm LEFT ATRIUM             Index       RIGHT ATRIUM           Index LA diam:        2.90 cm 1.63 cm/m  RA Area:     14.60 cm LA Vol (A2C):   52.5 ml 29.58 ml/m RA Volume:   31.80 ml  17.92 ml/m LA Vol (A4C):   37.4 ml 21.07 ml/m LA Biplane Vol: 45.4 ml 25.58 ml/m  AORTIC VALVE AV Area (Vmax):    1.02 cm AV Area (Vmean):   1.04 cm AV Area (VTI):     1.06 cm AV Vmax:           247.00 cm/s AV Vmean:          168.000 cm/s AV VTI:            0.499 m AV Peak Grad:      24.4 mmHg AV Mean Grad:      13.0 mmHg LVOT Vmax:         99.40 cm/s LVOT Vmean:        68.400 cm/s LVOT VTI:          0.207 m LVOT/AV VTI ratio: 0.41  AORTA Ao Root diam: 3.50 cm MITRAL VALVE                        TRICUSPID VALVE MV Area (PHT):  3.27 cm             TR Peak grad:   26.2 mmHg MV PHT:        67.28 msec           TR  Vmax:        256.00 cm/s MV Decel Time: 232 msec MV E velocity: 89.80 cm/s 103 cm/s  SHUNTS MV A velocity: 78.30 cm/s 70.3 cm/s Systemic VTI:  0.21 m MV E/A ratio:  1.15       1.5       Systemic Diam: 1.80 cm  Dina RichJonathan Branch MD Electronically signed by Dina RichJonathan Branch MD Signature Date/Time: 08/26/2019/4:44:12 PM    Final      Scheduled Meds:  Chlorhexidine Gluconate Cloth  6 each Topical Daily   digoxin  0.25 mg Oral Q6H   folic acid  1 mg Oral Daily   LORazepam  0-4 mg Intravenous Q12H   mouth rinse  15 mL Mouth Rinse BID   metoprolol tartrate  37.5 mg Oral BID   multivitamin with minerals  1 tablet Oral Daily   pantoprazole (PROTONIX) IV  40 mg Intravenous Q24H   thiamine  100 mg Oral Daily   Or   thiamine  100 mg Intravenous Daily   Continuous Infusions:  sodium chloride 10 mL/hr at 08/25/19 1526   levofloxacin (LEVAQUIN) IV 750 mg (08/26/19 2007)   potassium chloride 10 mEq (08/27/19 1447)   thiamine injection 500 mg (08/26/19 1815)    Principal Problem:   Ataxia Active Problems:   GLUCOSE INTOLERANCE   Alcohol abuse   Essential hypertension   CAD (coronary artery disease), native coronary artery   UTI (urinary tract infection)   Arrhythmia   Cirrhosis of liver (HCC)   Hypokalemia   Weakness generalized   Jaundice   Ascites   Hepatitis   Severe protein-calorie malnutrition (HCC)   Acute liver failure   Hyperbilirubinemia   Hyponatremia   Leukocytosis   Loss of weight   Dysphagia   Aortic stenosis   Self-catheterizes urinary bladder   Alcohol intoxication (HCC)   Thrombocytopenia (HCC)   History of GI bleed   Atrial fibrillation with RVR (HCC)   Goals of care, counseling/discussion   Palliative care by specialist   DNR (do not resuscitate) discussion   Terminal illness   Hypotension - soft BPs   Elevated brain natriuretic peptide (BNP) level    Hypoalbuminemia   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  1. Check the care team in [facility] and look for a) attending/consulting [facility] provider listed and b) the [facility] team listed 2. Log into [email or web address] and use [place] universal password to access. If you do not have the password, please contact the hospital operator. 3. Locate the [facility] provider you are looking for under [facility] and page to a number that you can be directly reached. 4. If you still have difficulty reaching the provider, please page the [facility] (Director on Call) for the Hospitalists listed on amion for assistance.

## 2019-08-27 NOTE — Progress Notes (Addendum)
Progress Note  Patient Name: Billy Stone Date of Encounter: 08/27/2019  Primary Cardiologist: Nona Dell, MD   Subjective   He denies any symptoms at this time. Says his heart "feels funny" at times but denies any specific palpitations. No chest pain or dyspnea. Patient's brother currently at the bedside.    Inpatient Medications    Scheduled Meds: . Chlorhexidine Gluconate Cloth  6 each Topical Daily  . diltiazem  5 mg Intravenous Once  . folic acid  1 mg Oral Daily  . ipratropium-albuterol  3 mL Nebulization TID  . LORazepam  0-4 mg Intravenous Q12H  . mouth rinse  15 mL Mouth Rinse BID  . metoprolol tartrate  37.5 mg Oral BID  . multivitamin with minerals  1 tablet Oral Daily  . pantoprazole (PROTONIX) IV  40 mg Intravenous Q24H  . thiamine  100 mg Oral Daily   Or  . thiamine  100 mg Intravenous Daily   Continuous Infusions: . sodium chloride 10 mL/hr at 08/25/19 1526  . levofloxacin (LEVAQUIN) IV 750 mg (08/26/19 2007)  . thiamine injection 500 mg (08/26/19 1815)   PRN Meds: levalbuterol, LORazepam **OR** LORazepam, metoprolol tartrate, nitroGLYCERIN, ondansetron **OR** ondansetron (ZOFRAN) IV, oxyCODONE   Vital Signs    Vitals:   08/27/19 0500 08/27/19 0549 08/27/19 0733 08/27/19 0838  BP:  114/74  106/73  Pulse:  72  (!) 130  Resp:  20  20  Temp:  98.2 F (36.8 C)  98.6 F (37 C)  TempSrc:  Oral  Oral  SpO2:  99% 99% 100%  Weight: 69.8 kg     Height:        Intake/Output Summary (Last 24 hours) at 08/27/2019 1029 Last data filed at 08/27/2019 0300 Gross per 24 hour  Intake 1241 ml  Output 1200 ml  Net 41 ml    Last 3 Weights 08/27/2019 08/24/2019 04/28/2019  Weight (lbs) 153 lb 14.1 oz 155 lb 157 lb  Weight (kg) 69.8 kg 70.308 kg 71.215 kg      Telemetry    NSR with HR in 70's to 80's overnight, went into atrial fibrillation with HR in the 120's to 130's since 0800 this AM.  - Personally Reviewed  ECG    No new tracings.    Physical Exam   General: Ill-appearing Caucasian male currently in no acute distress. Head: Normocephalic, atraumatic.  Neck: Supple without bruits, JVD at 8 cm. Lungs:  Resp regular and unlabored, rales along bases bilaterally. Heart: Tachycardiac, irregularly irregular, S1, S2, no S3, S4, or murmur; no rub. Abdomen: Soft, non-tender, non-distended with normoactive bowel sounds. No hepatomegaly. No rebound/guarding. No obvious abdominal masses. Extremities: No clubbing or cyanosis, trace edema bilaterally. Distal pedal pulses are 2+ bilaterally. Neuro: Alert and oriented X 3. Moves all extremities spontaneously. Psych: Normal affect.  Labs    Chemistry Recent Labs  Lab 08/25/19 0429 08/26/19 0458 08/27/19 0640  NA 135 133* 136  K 3.8 4.2 3.8  CL 99 99 100  CO2 GLUCOSE 97 111* 107*  BUN CREATININE 0.75 0.72 0.75  CALCIUM 8.0* 8.4* 8.4*  PROT 7.2 7.5 6.7  ALBUMIN 2.4* 2.5* 2.1*  AST 52* 93* 72*  ALT ALKPHOS 77 76 58  BILITOT 3.9* 2.5* 2.0*  GFRNONAA >60 >60 >60  GFRAA >60 >60 >60  ANIONGAP Hematology Recent Labs  Lab 08/25/19 0429 08/26/19 0458 08/27/19 4098  WBC 20.8* 12.7* 11.0*  RBC 2.69* 2.92* 2.68*  HGB 9.8* 10.7* 9.8*  HCT 29.9* 33.0* 29.9*  MCV 111.2* 113.0* 111.6*  MCH 36.4* 36.6* 36.6*  MCHC 32.8 32.4 32.8  RDW 14.3 14.1 14.5  PLT 59* 67* 84*    Cardiac EnzymesNo results for input(s): TROPONINI in the last 168 hours. No results for input(s): TROPIPOC in the last 168 hours.   BNP Recent Labs  Lab 08/25/19 0429 08/26/19 0458  BNP 1,264.0* 1,815.0*     DDimer No results for input(s): DDIMER in the last 168 hours.   Radiology    DG CHEST PORT 1 VIEW  Result Date: 08/26/2019 CLINICAL DATA:  68 year old male with a history shortness of breath and wheezing EXAM: PORTABLE CHEST 1 VIEW COMPARISON:  08/25/2019, 08/24/2019, CT chest 05/06/2017 FINDINGS: Cardiomediastinal silhouette unchanged in size  and contour. Low lung volumes with mild reticular opacities. No pneumothorax. No large pleural effusion. No new confluent airspace disease. The patient has sequential posterior right rib fractures which are partially healed, in addition to new acute displaced rib fractures at the lateral right ribs of 5, 6, 7, 8. These acute rib fractures were present not on the prior CT but on the recent comparison chest x-rays. No new acute left-sided rib fractures identified. IMPRESSION: Low lung volumes with coarsened interstitial markings, potentially atelectasis or developing atypical infection. The patient has new acute displaced rib fractures at the lateral aspect of the right ribs 5-8. These were present on the most recent comparison chest x-ray studies and are new from the CT of 2018, and correlation with any recent chest trauma may be useful. 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 Electronically Signed   By: Corrie Mckusick D.O.   On: 08/26/2019 12:47   ECHOCARDIOGRAM COMPLETE  Result Date: 08/26/2019   ECHOCARDIOGRAM REPORT   Patient Name:   Billy Stone Date of Exam: 08/26/2019 Medical Rec #:  [id number]       Height:       65.0 in Accession #:    [id number]      Weight:       155.0 lb Date of Birth:  03/16/1951       BSA:          1.78 m Patient Age:    68 years        BP:           114/60 mmHg Patient Gender: M               HR:           89 bpm. Exam Location:  [facility] Procedure: 2D Echo, Cardiac Doppler and Color Doppler Indications:    Aortic Stenosis 424.1 / 135.0  History:        Patient has prior history of Echocardiogram examinations, most                 recent 08/20/2017. CAD, Arrythmias:Atrial Fibrillation; Risk                 Factors:Hypertension. Acute liver failure,Alcohol abuse,.  Sonographer:    Alvino Chapel RCS Referring Phys: [id number] Erma Heritage  Sonographer Comments: Patient could NOT follow commands to "sniff". IMPRESSIONS  1. Left  ventricular ejection fraction, by visual estimation, is 55 to 60%. The left ventricle has normal function. There is no left ventricular hypertrophy.  2. The left ventricle has no regional wall motion abnormalities.  3. Global right  ventricle has normal systolic function.The right ventricular size is normal. No increase in right ventricular wall thickness.  4. Left atrial size was normal.  5. Right atrial size was normal.  6. Moderate mitral annular calcification.  7. Moderate thickening of the mitral valve leaflet(s).  8. The mitral valve is normal in structure. Trivial mitral valve regurgitation. No evidence of mitral stenosis.  9. The tricuspid valve is normal in structure. 10. The aortic valve has an indeterminant number of cusps. Aortic valve regurgitation is not visualized. Mild to moderate aortic valve stenosis. 11. The pulmonic valve was not well visualized. Pulmonic valve regurgitation is not visualized. 12. Mildly elevated pulmonary artery systolic pressure. FINDINGS  Left Ventricle: Left ventricular ejection fraction, by visual estimation, is 55 to 60%. The left ventricle has normal function. The left ventricle has no regional wall motion abnormalities. There is no left ventricular hypertrophy. Left ventricular diastolic parameters were normal. Right Ventricle: The right ventricular size is normal. No increase in right ventricular wall thickness. Global RV systolic function is has normal systolic function. The tricuspid regurgitant velocity is 2.56 m/s, and with an assumed right atrial pressure  of 10 mmHg, the estimated right ventricular systolic pressure is mildly elevated at 36.2 mmHg. Left Atrium: Left atrial size was normal in size. Right Atrium: Right atrial size was normal in size Pericardium: There is no evidence of pericardial effusion. Mitral Valve: The mitral valve is normal in structure. There is moderate thickening of the mitral valve leaflet(s). Moderate mitral annular calcification. Trivial  mitral valve regurgitation. No evidence of mitral valve stenosis by observation. Tricuspid Valve: The tricuspid valve is normal in structure. Tricuspid valve regurgitation is mild. Aortic Valve: The aortic valve has an indeterminant number of cusps. . There is moderate thickening and moderate calcification of the aortic valve. Aortic valve regurgitation is not visualized. Mild to moderate aortic stenosis is present. Moderate aortic  valve annular calcification. There is moderate thickening of the aortic valve. There is moderate calcification of the aortic valve. Aortic valve mean gradient measures 13.0 mmHg. Aortic valve peak gradient measures 24.4 mmHg. Aortic valve area, by VTI measures 1.06 cm. Pulmonic Valve: The pulmonic valve was not well visualized. Pulmonic valve regurgitation is not visualized. Pulmonic regurgitation is not visualized. No evidence of pulmonic stenosis. Aorta: The aortic root is normal in size and structure. Pulmonary Artery: There is moderate pulmonary hypertension, the PASP is 41 mmHg. IAS/Shunts: No atrial level shunt detected by color flow Doppler.  LEFT VENTRICLE PLAX 2D LVIDd:         3.77 cm       Diastology LVIDs:         2.68 cm       LV e' lateral:   11.20 cm/s LV PW:         0.93 cm       LV E/e' lateral: 8.0 LV IVS:        1.10 cm       LV e' medial:    8.05 cm/s LVOT diam:     1.80 cm       LV E/e' medial:  11.2 LV SV:         34 ml LV SV Index:   18.94 LVOT Area:     2.54 cm  LV Volumes (MOD) LV area d, A2C:    26.20 cm LV area d, A4C:    23.50 cm LV area s, A2C:    12.10 cm LV area s, A4C:  12.40 cm LV major d, A2C:   8.02 cm LV major d, A4C:   7.51 cm LV major s, A2C:   6.38 cm LV major s, A4C:   6.37 cm LV vol d, MOD A2C: 71.6 ml LV vol d, MOD A4C: 60.4 ml LV vol s, MOD A2C: 20.0 ml LV vol s, MOD A4C: 20.3 ml LV SV MOD A2C:     51.6 ml LV SV MOD A4C:     60.4 ml LV SV MOD BP:      47.7 ml RIGHT VENTRICLE RV S prime:     13.40 cm/s TAPSE (M-mode): 1.8 cm LEFT ATRIUM              Index       RIGHT ATRIUM           Index LA diam:        2.90 cm 1.63 cm/m  RA Area:     14.60 cm LA Vol (A2C):   52.5 ml 29.58 ml/m RA Volume:   31.80 ml  17.92 ml/m LA Vol (A4C):   37.4 ml 21.07 ml/m LA Biplane Vol: 45.4 ml 25.58 ml/m  AORTIC VALVE AV Area (Vmax):    1.02 cm AV Area (Vmean):   1.04 cm AV Area (VTI):     1.06 cm AV Vmax:           247.00 cm/s AV Vmean:          168.000 cm/s AV VTI:            0.499 m AV Peak Grad:      24.4 mmHg AV Mean Grad:      13.0 mmHg LVOT Vmax:         99.40 cm/s LVOT Vmean:        68.400 cm/s LVOT VTI:          0.207 m LVOT/AV VTI ratio: 0.41  AORTA Ao Root diam: 3.50 cm MITRAL VALVE                        TRICUSPID VALVE MV Area (PHT): 3.27 cm             TR Peak grad:   26.2 mmHg MV PHT:        67.28 msec           TR Vmax:        256.00 cm/s MV Decel Time: 232 msec MV E velocity: 89.80 cm/s 103 cm/s  SHUNTS MV A velocity: 78.30 cm/s 70.3 cm/s Systemic VTI:  0.21 m MV E/A ratio:  1.15       1.5       Systemic Diam: 1.80 cm  Dina Rich MD Electronically signed by Dina Rich MD Signature Date/Time: 08/26/2019/4:44:12 PM    Final     Cardiac Studies   Echocardiogram: 08/26/2019 IMPRESSIONS    1. Left ventricular ejection fraction, by visual estimation, is 55 to 60%. The left ventricle has normal function. There is no left ventricular hypertrophy.  2. The left ventricle has no regional wall motion abnormalities.  3. Global right ventricle has normal systolic function.The right ventricular size is normal. No increase in right ventricular wall thickness.  4. Left atrial size was normal.  5. Right atrial size was normal.  6. Moderate mitral annular calcification.  7. Moderate thickening of the mitral valve leaflet(s).  8. The mitral valve is normal in structure. Trivial mitral valve regurgitation. No evidence of mitral  stenosis.  9. The tricuspid valve is normal in structure. 10. The aortic valve has an indeterminant number of  cusps. Aortic valve regurgitation is not visualized. Mild to moderate aortic valve stenosis. 11. The pulmonic valve was not well visualized. Pulmonic valve regurgitation is not visualized. 12. Mildly elevated pulmonary artery systolic pressure.  Patient Profile     68 y.o. male w/ PMH of CAD (multivessel CAD by cath in 1999 with medical management pursued), mild to moderate AS, HTN, HLD, history of GIB and alcohol abuse who is currently admitted for worsening weakness, balance issues and AMS. Cardiology consulted as he developed atrial fibrillation with RVR during admission.   Assessment & Plan    1. New-Onset Atrial Fibrillation with RVR - New diagnosis for the patient this admission and occurred in the setting of acute alcohol intoxication and UTI. He was overall unaware of his arrhythmia and converted back to NSR yesterday but had recurrence this AM. HR currently in the 120's to 130's. By review of MAR, Lopressor was held last night given hypotension but administered this AM. IV Cardizem 5mg  just administered as ordered by the admitting team. Continue to follow on telemetry and assess HR response. Would not further titrate Lopressor given hypotension.  - This patients CHA2DS2-VASc Score and unadjusted Ischemic Stroke Rate (% per year) is equal to 3.2 % stroke rate/year from a score of 3 (HTN, Vascular, Age).  He is not a good candidate for anticoagulation given his chronic alcohol abuse, history of GIB and PUD.  2. CAD - He has a history of known CAD by prior catheterization in 1999. He has denied any chest discomfort this admission. Medical management have been pursued in the past given his alcohol abuse and inability to even tolerate ASA in the past. - BB has been restarted as outlined above. Imdur currently held given hypotension. Not on statin prior to admission given alcohol use and elevated LFT's.   3. Acute Diastolic CHF Exacerbation -Echo this admission shows a preserved EF as  outlined above. BNP elevated to 1815 on 12/29 and received IV Lasix yesterday but this was discontinued by the admitting team and he received a 1L fluid bolus overnight given hypotension. SBP currently stable in the low-100's but now received a one-time dose of IV Cardizem 5mg  given elevated HR. Would monitor BP response and can hopefully resume low-dose IV Lasix later today. Would repeat CXR and BNP in AM.    4. Aortic Stenosis - repeat echo this admission shows mild to moderate AS. Continue to follow as an outpatient.   5. UTI - WBC initially elevated to 20.8, improved to 11.0 this AM. On Levaquin. Further management per admitting team.   6. Acute Alcohol Intoxication - patient reported consuming 10-12 beers daily PTA in the setting of known cirrhosis. Palliative Care following this admission.     For questions or updates, please contact [facility] Please consult [email or web address] for contact info under Cardiology/STEMI.   1/30 , PA-C 10:29 AM 08/27/2019 Pager: [phone]  Attending note Patient seen and discussed with PA 08/29/2019, I agree with her documentation. We are following for new onset of afib as well as acute diastolic HF. Intermittent afib this admission, management has been complicated by soft bp's. Has not been started on anticoag due to history of EtOH abuse, history of GI Bleeding, and chronic thrombocytopenia. Currently on lopressor 37.5mg  bid, SBPs low 100s currently but 80s overnight. Hopefully as UTI and acute EtoH intoxication resolve drive for tachcyardia  will resolve. Keep Mg at 2 K 4, I have written for 2g of Mg and 40mEq of KCl. With liver disease and inability to anticoagulate would limit considration for antiarrhythmics. Could consider digoxin for now, hopefully as acute infection and EtoH resolves afib rates will also lower. Start digoxin 0.25mg  po every 6 hours x 4 doses, then 0.125mg  daily.    Signs of fluid overload on presentation.  Diuresis limited by soft bp's, received some IVFs yesterday due to low bps. Echo this admit LVEF 55-60%. HOld on diuresis at this time given bp's, may consider gentle diuresis pending bp's tomorrow   Dina RichJonathan Danielys Madry MD

## 2019-08-27 NOTE — Progress Notes (Signed)
TRH night shift.  The patient;s BP has been persistently low. LR 1000 ML was given after BP decreased to 84/49 with normalization of BP after bolus infusion. See BP trend below please.    08/27/19 05:49:40  98.2 F (36.8 C)  72  --  20  114/74  Lying  99 %  Nasal Cannula  3 L/min  --  -- YB   08/27/19 01:33:55  --  79  --  --  99/61  --  --  --  --  --  -- KG   08/27/19 00:14:01  97.6 F (36.4 C)  79  --  19  84/49Abnormal   Lying  99 %  Nasal Cannula  3 L/min  --  -- YB   08/26/19 22:43:38  --  84  --  --  92/59Abnormal   --  --  --  --  --  -- KG   08/26/19 2025  99.9 F                          Tennis Must, MD

## 2019-08-27 NOTE — Care Management Important Message (Signed)
Important Message  Patient Details  Name: NERI SAMEK MRN: [id number] Date of Birth: 1951/07/29   Medicare Important Message Given:  Yes(Shannon, RN will deliver to patient room)     Tommy Medal 08/27/2019, 1:49 PM

## 2019-08-27 NOTE — Progress Notes (Signed)
Palliative: Billy Stone, Billy Stone, is resting quietly in bed.  Cardiology is at bedside along with brother Billy Stone.  Cardiology answers questions and completes visit.    Billy Stone has made a marked recovery since yesterday.  He is able to make and somewhat keep eye contact.  He is alert and oriented 3.    We talk about his acute and chronic health concerns and the treatment plan, including but not limited to fluid overload from HF, neb treatments.  Billy Stone asks about medication list.   We talk about going to rehab.  Billy Stone shares that he has been to [facility] Stone for rehab in past, and Billy Stone would be his first choice if he decides to go to rehab.    We did not discuss code status today as Billy Stone is starting to drift to sleep, and states he is having trouble understanding.  I encourage out patient palliative, but patient and family are unsure about accepting this service.   Plan:   Continue to treat the treatable, considering rehab [facility] first choice), would re hospitalize.     38 minutes Billy Axe, NP Palliative Medicine Team Team Phone # [phone] Greater than 50% of this time was spent counseling and coordinating care related to the above assessment and plan.

## 2019-08-27 NOTE — TOC Progression Note (Addendum)
Transition of Care [facility]) - Progression Note    Patient Details  Name: Billy Stone MRN: [id number] Date of Birth: 09-29-1950  [facility] CM/SW Contact  Nylah Butkus, Chauncey Reading, RN Phone Number: 08/27/2019, 2:37 PM  Clinical Narrative:  Discussed SNF recommendation with patient. He mentions [facility], updated patient that [facility] will not be an option.   He prefers to go home, stating his brother can help out.  He is agreeable to home health PT.   Will ask PT to reevaluate for possibility of returning home.   Call to brother to [place], left VM for return call.     Expected Discharge Plan: [place] Barriers to Discharge: Continued Medical Work up  Expected Discharge Plan and Services Expected Discharge Plan: [place]   Discharge Planning Services: CM Consult   Living arrangements for the past 2 months: Single Family Home                      Social Determinants of Health (SDOH) Interventions    Readmission Risk Interventions No flowsheet data found.

## 2019-08-27 NOTE — Plan of Care (Signed)
  Problem: Education: Goal: Knowledge of General Education information will improve Description: Including pain rating scale, medication(s)/side effects and non-pharmacologic comfort measures Outcome: Progressing   Problem: Clinical Measurements: Goal: Diagnostic test results will improve Outcome: Progressing   

## 2019-08-28 ENCOUNTER — Inpatient Hospital Stay (HOSPITAL_COMMUNITY): Payer: Medicare Other

## 2019-08-28 DIAGNOSIS — E8809 Other disorders of plasma-protein metabolism, not elsewhere classified: Secondary | ICD-10-CM

## 2019-08-28 LAB — CBC WITH DIFFERENTIAL/PLATELET
Abs Immature Granulocytes: 0.05 10*3/uL (ref 0.00–0.07)
Basophils Absolute: 0.1 10*3/uL (ref 0.0–0.1)
Basophils Relative: 1 %
Eosinophils Absolute: 0.2 10*3/uL (ref 0.0–0.5)
Eosinophils Relative: 2 %
HCT: 28.5 % — ABNORMAL LOW (ref 39.0–52.0)
Hemoglobin: 9.5 g/dL — ABNORMAL LOW (ref 13.0–17.0)
Immature Granulocytes: 1 %
Lymphocytes Relative: 26 %
Lymphs Abs: 2.3 10*3/uL (ref 0.7–4.0)
MCH: 36.8 pg — ABNORMAL HIGH (ref 26.0–34.0)
MCHC: 33.3 g/dL (ref 30.0–36.0)
MCV: 110.5 fL — ABNORMAL HIGH (ref 80.0–100.0)
Monocytes Absolute: 1.2 10*3/uL — ABNORMAL HIGH (ref 0.1–1.0)
Monocytes Relative: 14 %
Neutro Abs: 4.9 10*3/uL (ref 1.7–7.7)
Neutrophils Relative %: 56 %
Platelets: 73 10*3/uL — ABNORMAL LOW (ref 150–400)
RBC: 2.58 MIL/uL — ABNORMAL LOW (ref 4.22–5.81)
RDW: 14.7 % (ref 11.5–15.5)
WBC: 8.6 10*3/uL (ref 4.0–10.5)
nRBC: 0.6 % — ABNORMAL HIGH (ref 0.0–0.2)

## 2019-08-28 LAB — HEPATIC FUNCTION PANEL
ALT: 16 U/L (ref 0–44)
AST: 51 U/L — ABNORMAL HIGH (ref 15–41)
Albumin: 2 g/dL — ABNORMAL LOW (ref 3.5–5.0)
Alkaline Phosphatase: 60 U/L (ref 38–126)
Bilirubin, Direct: 0.9 mg/dL — ABNORMAL HIGH (ref 0.0–0.2)
Indirect Bilirubin: 1.1 mg/dL — ABNORMAL HIGH (ref 0.3–0.9)
Total Bilirubin: 2 mg/dL — ABNORMAL HIGH (ref 0.3–1.2)
Total Protein: 6.4 g/dL — ABNORMAL LOW (ref 6.5–8.1)

## 2019-08-28 LAB — PROTIME-INR
INR: 1.4 — ABNORMAL HIGH (ref 0.8–1.2)
Prothrombin Time: 17.4 seconds — ABNORMAL HIGH (ref 11.4–15.2)

## 2019-08-28 LAB — BASIC METABOLIC PANEL
Anion gap: 11 (ref 5–15)
BUN: 21 mg/dL (ref 8–23)
CO2: 22 mmol/L (ref 22–32)
Calcium: 8.4 mg/dL — ABNORMAL LOW (ref 8.9–10.3)
Chloride: 97 mmol/L — ABNORMAL LOW (ref 98–111)
Creatinine, Ser: 0.92 mg/dL (ref 0.61–1.24)
GFR calc Af Amer: >60 mL/min (ref >60)
GFR calc non Af Amer: >60 mL/min (ref >60)
Glucose, Bld: 99 mg/dL (ref 70–99)
Potassium: 3.8 mmol/L (ref 3.5–5.1)
Sodium: 130 mmol/L — ABNORMAL LOW (ref 135–145)

## 2019-08-28 LAB — MAGNESIUM: Magnesium: 2 mg/dL (ref 1.7–2.4)

## 2019-08-28 LAB — BRAIN NATRIURETIC PEPTIDE: B Natriuretic Peptide: 1326 pg/mL — ABNORMAL HIGH (ref 0.0–100.0)

## 2019-08-28 IMAGING — DX DG CHEST 1V PORT
1 series · 1 of 1 positions shown · non-contrast
Comparison: [DATE]

CLINICAL DATA: Shortness of breath and wheezing

EXAM:
PORTABLE CHEST 1 VIEW

[chest ap]
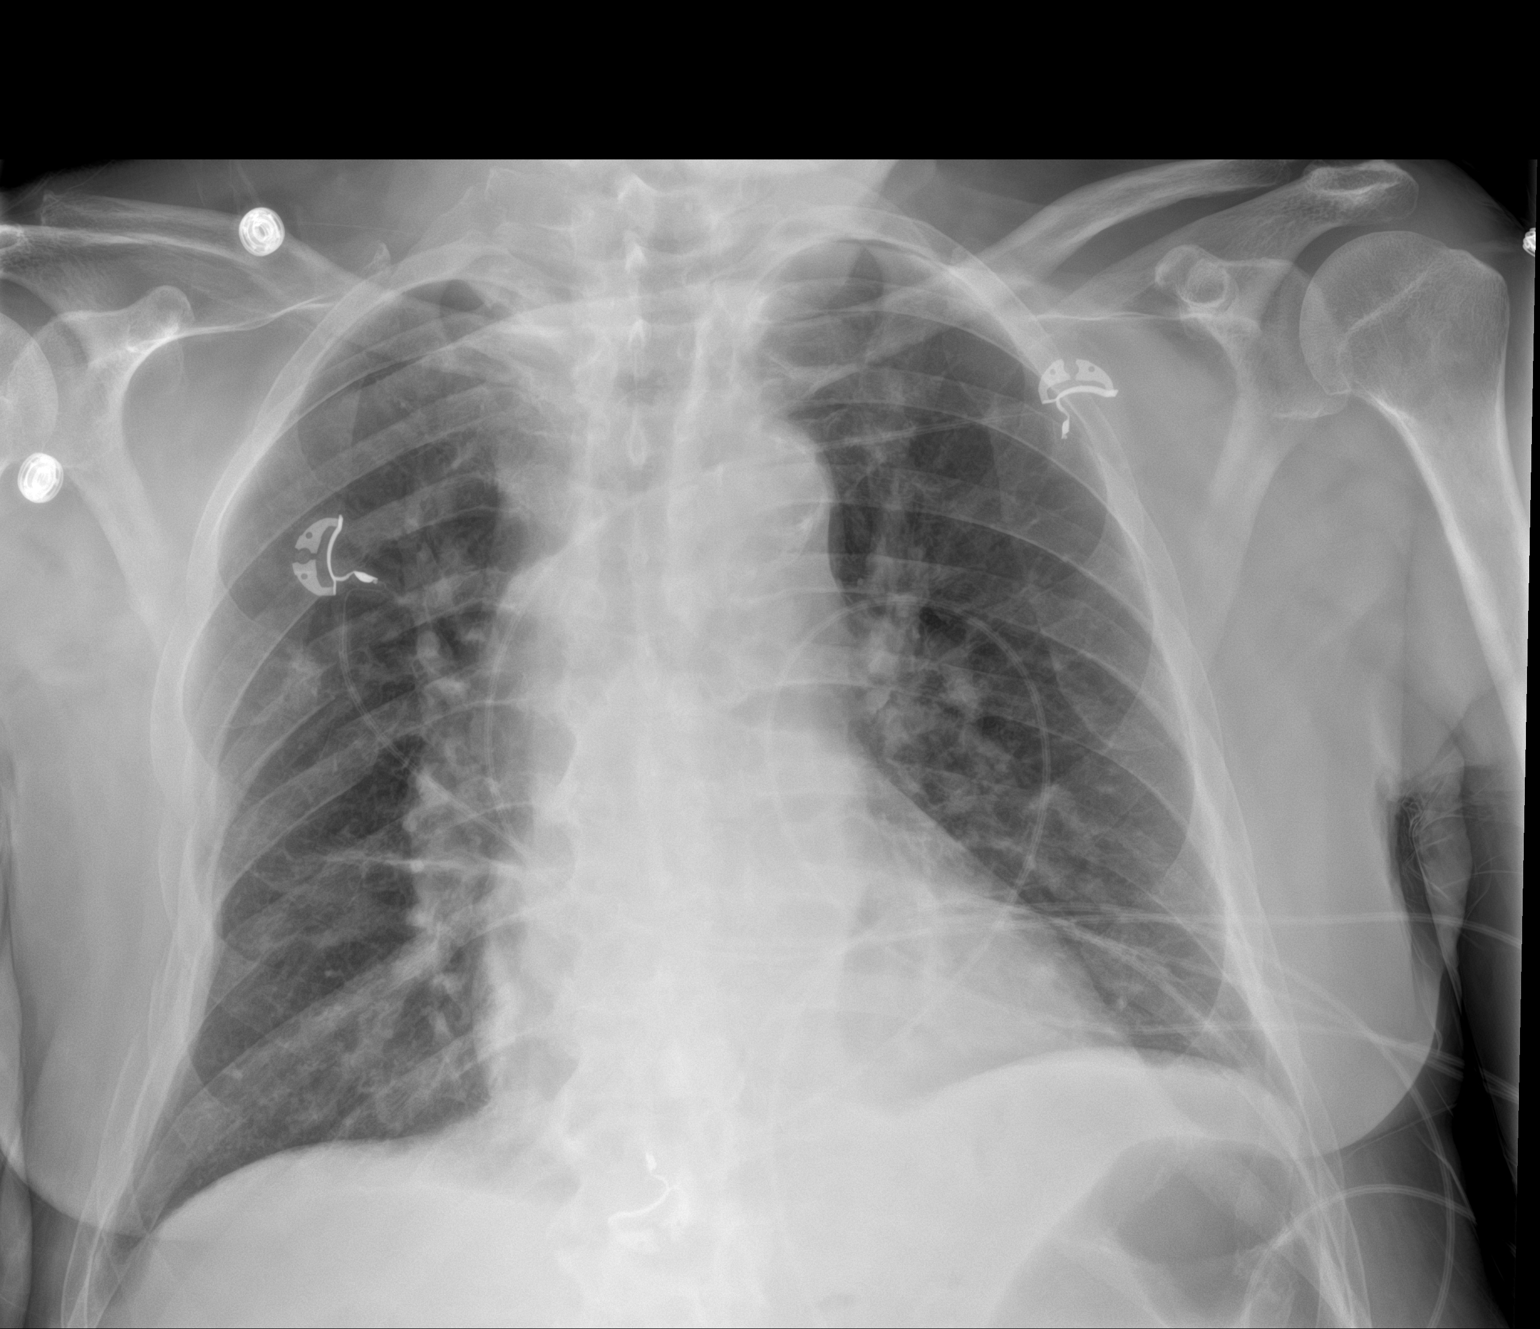

[1 of 1 positions shown; findings below may reference images not displayed]

FINDINGS: Cardiac shadow is stable. Aortic calcifications are seen. The lungs
are well aerated bilaterally. Some slight increase in central
vascular congestion is noted. Healed posterior right rib fractures
are noted with new lateral right rib fracture stable from the prior
study. No new bony abnormality is seen.
IMPRESSION: Mild vascular congestion.

Multiple right rib fractures laterally.

## 2019-08-28 MED ORDER — PANTOPRAZOLE SODIUM 40 MG PO TBEC
40.0000 mg | DELAYED_RELEASE_TABLET | Freq: Every day | ORAL | Status: DC
  Administered 2019-08-28 – 2019-09-01 (×5): 40 mg via ORAL
  Filled 2019-08-28 (×5): qty 1

## 2019-08-28 MED ORDER — SODIUM CHLORIDE 1 G PO TABS
1.0000 g | ORAL_TABLET | Freq: Two times a day (BID) | ORAL | Status: DC
  Administered 2019-08-28 – 2019-08-29 (×3): 1 g via ORAL
  Filled 2019-08-28 (×3): qty 1

## 2019-08-28 MED ORDER — DOCUSATE SODIUM 100 MG PO CAPS: 100.0000 mg | ORAL_CAPSULE | Freq: Every day | ORAL | Status: DC | PRN

## 2019-08-28 MED ORDER — DIGOXIN 125 MCG PO TABS
0.1250 mg | ORAL_TABLET | Freq: Every day | ORAL | Status: DC
  Administered 2019-08-28 – 2019-09-01 (×5): 0.125 mg via ORAL
  Filled 2019-08-28 (×5): qty 1

## 2019-08-28 MED ORDER — BISACODYL 10 MG RE SUPP
10.0000 mg | Freq: Once | RECTAL | Status: DC
  Filled 2019-08-28: qty 1

## 2019-08-28 MED ORDER — METOPROLOL TARTRATE 25 MG PO TABS
25.0000 mg | ORAL_TABLET | Freq: Two times a day (BID) | ORAL | Status: DC
  Administered 2019-08-28 – 2019-09-01 (×9): 25 mg via ORAL
  Filled 2019-08-28 (×9): qty 1

## 2019-08-28 NOTE — Progress Notes (Signed)
Palliative: [name], Billy Stone, is sitting up quietly in bed.  He greets me making and keeping eye contact.  He appears much improved today.  Present today at bedside his brother Billy Stone who gets daughter Billy Stone on the telephone for conference.  We talked in detail about the treatment plan, UTIs complicated in men, medication management/adjustment, selected labs.  We also talked about alcohol use.  Billy Stone shares that at this point he wants to return to his own home, not rehab.  He states that he can care for himself at home, but Billy Stone shares that she took several people to assist him back to bed last night.  After some discussion it is clear that Billy Stone wants to return to his own home so that he can drink alcohol.    Billy Stone shares that he will no longer get alcohol for Billy Stone.  Billy Stone states that he wants to continue drinking, sharing a story of being raised "[employer]" and when he "shook that off" he now wants alcohol.  I share that at this point the physiological need/risk for DTs is gone, this is a psychological pull.  I share with Billy Stone that we are not here for an intervention, but just talk about medical facts/choices.  We talked about CODE STATUS, "treat the treatable but allowing natural death".  I encouraged Billy Stone to consider his choices, continue discussions with Billy Stone and Billy Stone.  Conference with attending and bedside nursing staff related to patient condition, needs, medications.  Plan: Continue to treat the treatable.  Continue CODE STATUS discussions.  Anticipate that Billy Stone will decline rehab as he improves.      31 minutes [name], NP Palliative Medicine Team Team Phone # [phone] Greater than 50% of this time was spent counseling and coordinating care related to the above assessment and plan.

## 2019-08-28 NOTE — TOC Progression Note (Addendum)
Transition of Care [facility]) - Progression Note    Patient Details  Name: Billy Stone MRN: [id number] Date of Birth: 06-Aug-1951  [facility] CM/SW Contact  Samara Stankowski, Chauncey Reading, RN Phone Number: 08/28/2019, 3:49 PM  Clinical Narrative:   Discussed bed offers and CMS website star ratings with patient. He elects [facility] due to location.  Left VM with admissions coordinator to accept offer.    Expected Discharge Plan: [place] Barriers to Discharge: Continued Medical Work up  Expected Discharge Plan and Services Expected Discharge Plan: [place]   Discharge Planning Services: CM Consult   Living arrangements for the past 2 months: Single Family Home                                       Social Determinants of Health (SDOH) Interventions    Readmission Risk Interventions No flowsheet data found.

## 2019-08-28 NOTE — Progress Notes (Addendum)
Progress Note  Patient Name: Billy Stone Date of Encounter: 08/28/2019  Primary Cardiologist: Nona Dell, MD   Subjective   Patient anxious to go home. Denies any chest pain, palpitations or dyspnea overnight. Evaluated by PT yesterday with SNF recommended at discharge.   Inpatient Medications    Scheduled Meds: . Chlorhexidine Gluconate Cloth  6 each Topical Daily  . folic acid  1 mg Oral Daily  . LORazepam  0-4 mg Intravenous Q12H  . mouth rinse  15 mL Mouth Rinse BID  . metoprolol tartrate  25 mg Oral BID  . multivitamin with minerals  1 tablet Oral Daily  . pantoprazole (PROTONIX) IV  40 mg Intravenous Q24H  . thiamine  100 mg Oral Daily   Or  . thiamine  100 mg Intravenous Daily   Continuous Infusions: . sodium chloride 10 mL/hr at 08/25/19 1526   PRN Meds: levalbuterol, metoprolol tartrate, nitroGLYCERIN, ondansetron **OR** ondansetron (ZOFRAN) IV, oxyCODONE   Vital Signs    Vitals:   08/27/19 2308 08/28/19 0224 08/28/19 0458 08/28/19 0500  BP: (!) 91/59 (!) 94/59 91/66   Pulse: 80  84   Resp:   16   Temp:   98.6 F (37 C)   TempSrc:   Oral   SpO2: 95%  93%   Weight:    74 kg  Height:        Intake/Output Summary (Last 24 hours) at 08/28/2019 0815 Last data filed at 08/28/2019 0500 Gross per 24 hour  Intake 100 ml  Output 825 ml  Net -725 ml    Last 3 Weights 08/28/2019 08/27/2019 08/24/2019  Weight (lbs) 163 lb 2.3 oz 153 lb 14.1 oz 155 lb  Weight (kg) 74 kg 69.8 kg 70.308 kg      Telemetry    Was in atrial fibrillation until 1830 on 12/30, now in NSR since with HR in the 80's.  - Personally Reviewed  ECG    No new tracings.   Physical Exam   General: Well developed, well nourished, male appearing in no acute distress. Head: Normocephalic, atraumatic.  Neck: Supple without bruits, JVD at 8cm. Lungs:  Resp regular and unlabored, mild rales along bases. Heart: RRR, S1, S2, no S3, S4, no rub. 2/6 SEM along RUSB.  Abdomen:  Soft, non-tender, non-distended with normoactive bowel sounds. No hepatomegaly. No rebound/guarding. No obvious abdominal masses. Extremities: No clubbing, cyanosis, or lower extremity edema. Distal pedal pulses are 2+ bilaterally. Neuro: Alert and oriented X 3. Moves all extremities spontaneously. Psych: Normal affect.  Labs    Chemistry Recent Labs  Lab 08/26/19 0458 08/27/19 0640 08/28/19 0557  NA 133* 136 130*  K 4.2 3.8 3.8  CL 99 100 97*  CO2 GLUCOSE 111* 107* 99  BUN CREATININE 0.72 0.75 0.92  CALCIUM 8.4* 8.4* 8.4*  PROT 7.5 6.7 6.4*  ALBUMIN 2.5* 2.1* 2.0*  AST 93* 72* 51*  ALT ALKPHOS 76 58 60  BILITOT 2.5* 2.0* 2.0*  GFRNONAA >60 >60 >60  GFRAA >60 >60 >60  ANIONGAP Hematology Recent Labs  Lab 08/26/19 0458 08/27/19 0640 08/28/19 0557  WBC 12.7* 11.0* 8.6  RBC 2.92* 2.68* 2.58*  HGB 10.7* 9.8* 9.5*  HCT 33.0* 29.9* 28.5*  MCV 113.0* 111.6* 110.5*  MCH 36.6* 36.6* 36.8*  MCHC 32.4 32.8 33.3  RDW 14.1 14.5 14.7  PLT 67* 84* 73*    Cardiac  EnzymesNo results for input(s): TROPONINI in the last 168 hours. No results for input(s): TROPIPOC in the last 168 hours.   BNP Recent Labs  Lab 08/25/19 0429 08/26/19 0458 08/28/19 0558  BNP 1,264.0* 1,815.0* 1,326.0*     DDimer No results for input(s): DDIMER in the last 168 hours.   Radiology    DG Chest Port 1 View  Result Date: 08/28/2019 CLINICAL DATA:  Shortness of breath and wheezing EXAM: PORTABLE CHEST 1 VIEW COMPARISON:  08/26/2019 FINDINGS: Cardiac shadow is stable. Aortic calcifications are seen. The lungs are well aerated bilaterally. Some slight increase in central vascular congestion is noted. Healed posterior right rib fractures are noted with new lateral right rib fracture stable from the prior study. No new bony abnormality is seen. IMPRESSION: Mild vascular congestion. Multiple right rib fractures laterally. Electronically Signed   By: Alcide Clever M.D.   On: 08/28/2019 07:15   DG CHEST PORT 1 VIEW  Result Date: 08/26/2019 CLINICAL DATA:  68 year old male with a history shortness of breath and wheezing EXAM: PORTABLE CHEST 1 VIEW COMPARISON:  08/25/2019, 08/24/2019, CT chest 05/06/2017 FINDINGS: Cardiomediastinal silhouette unchanged in size and contour. Low lung volumes with mild reticular opacities. No pneumothorax. No large pleural effusion. No new confluent airspace disease. The patient has sequential posterior right rib fractures which are partially healed, in addition to new acute displaced rib fractures at the lateral right ribs of 5, 6, 7, 8. These acute rib fractures were present not on the prior CT but on the recent comparison chest x-rays. No new acute left-sided rib fractures identified. IMPRESSION: Low lung volumes with coarsened interstitial markings, potentially atelectasis or developing atypical infection. The patient has new acute displaced rib fractures at the lateral aspect of the right ribs 5-8. These were present on the most recent comparison chest x-ray studies and are new from the CT of 2018, and correlation with any recent chest trauma may be useful. 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 Electronically Signed   By: Gilmer Mor D.O.   On: 08/26/2019 12:47   ECHOCARDIOGRAM COMPLETE  Result Date: 08/26/2019   ECHOCARDIOGRAM REPORT   Patient Name:   Billy Stone Date of Exam: 08/26/2019 Medical Rec #:  [id number]       Height:       65.0 in Accession #:    [id number]      Weight:       155.0 lb Date of Birth:  27-Apr-1951       BSA:          1.78 m Patient Age:    68 years        BP:           114/60 mmHg Patient Gender: M               HR:           89 bpm. [facility] Procedure: 2D Echo, Cardiac Doppler and Color Doppler Indications:    Aortic Stenosis 424.1 / 135.0  History:        Patient has prior history of Echocardiogram examinations, most                  recent 08/20/2017. CAD, Arrythmias:Atrial Fibrillation; Risk                 Factors:Hypertension. Acute liver failure,Alcohol abuse,.  Sonographer:    Celesta Gentile RCS Referring Phys: [phone] [facility]  M STRADER  Sonographer Comments: Patient could NOT follow commands to "sniff". IMPRESSIONS  1. Left ventricular ejection fraction, by visual estimation, is 55 to 60%. The left ventricle has normal function. There is no left ventricular hypertrophy.  2. The left ventricle has no regional wall motion abnormalities.  3. Global right ventricle has normal systolic function.The right ventricular size is normal. No increase in right ventricular wall thickness.  4. Left atrial size was normal.  5. Right atrial size was normal.  6. Moderate mitral annular calcification.  7. Moderate thickening of the mitral valve leaflet(s).  8. The mitral valve is normal in structure. Trivial mitral valve regurgitation. No evidence of mitral stenosis.  9. The tricuspid valve is normal in structure. 10. The aortic valve has an indeterminant number of cusps. Aortic valve regurgitation is not visualized. Mild to moderate aortic valve stenosis. 11. The pulmonic valve was not well visualized. Pulmonic valve regurgitation is not visualized. 12. Mildly elevated pulmonary artery systolic pressure. FINDINGS  Left Ventricle: Left ventricular ejection fraction, by visual estimation, is 55 to 60%. The left ventricle has normal function. The left ventricle has no regional wall motion abnormalities. There is no left ventricular hypertrophy. Left ventricular diastolic parameters were normal. Right Ventricle: The right ventricular size is normal. No increase in right ventricular wall thickness. Global RV systolic function is has normal systolic function. The tricuspid regurgitant velocity is 2.56 m/s, and with an assumed right atrial pressure  of 10 mmHg, the estimated right ventricular systolic pressure is mildly elevated at 36.2 mmHg. Left Atrium: Left  atrial size was normal in size. Right Atrium: Right atrial size was normal in size Pericardium: There is no evidence of pericardial effusion. Mitral Valve: The mitral valve is normal in structure. There is moderate thickening of the mitral valve leaflet(s). Moderate mitral annular calcification. Trivial mitral valve regurgitation. No evidence of mitral valve stenosis by observation. Tricuspid Valve: The tricuspid valve is normal in structure. Tricuspid valve regurgitation is mild. Aortic Valve: The aortic valve has an indeterminant number of cusps. . There is moderate thickening and moderate calcification of the aortic valve. Aortic valve regurgitation is not visualized. Mild to moderate aortic stenosis is present. Moderate aortic  valve annular calcification. There is moderate thickening of the aortic valve. There is moderate calcification of the aortic valve. Aortic valve mean gradient measures 13.0 mmHg. Aortic valve peak gradient measures 24.4 mmHg. Aortic valve area, by VTI measures 1.06 cm. Pulmonic Valve: The pulmonic valve was not well visualized. Pulmonic valve regurgitation is not visualized. Pulmonic regurgitation is not visualized. No evidence of pulmonic stenosis. Aorta: The aortic root is normal in size and structure. Pulmonary Artery: There is moderate pulmonary hypertension, the PASP is 41 mmHg. IAS/Shunts: No atrial level shunt detected by color flow Doppler.  LEFT VENTRICLE PLAX 2D LVIDd:         3.77 cm       Diastology LVIDs:         2.68 cm       LV e' lateral:   11.20 cm/s LV PW:         0.93 cm       LV E/e' lateral: 8.0 LV IVS:        1.10 cm       LV e' medial:    8.05 cm/s LVOT diam:     1.80 cm       LV E/e' medial:  11.2 LV SV:  34 ml LV SV Index:   18.94 LVOT Area:     2.54 cm  LV Volumes (MOD) LV area d, A2C:    26.20 cm LV area d, A4C:    23.50 cm LV area s, A2C:    12.10 cm LV area s, A4C:    12.40 cm LV major d, A2C:   8.02 cm LV major d, A4C:   7.51 cm LV major s, A2C:    6.38 cm LV major s, A4C:   6.37 cm LV vol d, MOD A2C: 71.6 ml LV vol d, MOD A4C: 60.4 ml LV vol s, MOD A2C: 20.0 ml LV vol s, MOD A4C: 20.3 ml LV SV MOD A2C:     51.6 ml LV SV MOD A4C:     60.4 ml LV SV MOD BP:      47.7 ml RIGHT VENTRICLE RV S prime:     13.40 cm/s TAPSE (M-mode): 1.8 cm LEFT ATRIUM             Index       RIGHT ATRIUM           Index LA diam:        2.90 cm 1.63 cm/m  RA Area:     14.60 cm LA Vol (A2C):   52.5 ml 29.58 ml/m RA Volume:   31.80 ml  17.92 ml/m LA Vol (A4C):   37.4 ml 21.07 ml/m LA Biplane Vol: 45.4 ml 25.58 ml/m  AORTIC VALVE AV Area (Vmax):    1.02 cm AV Area (Vmean):   1.04 cm AV Area (VTI):     1.06 cm AV Vmax:           247.00 cm/s AV Vmean:          168.000 cm/s AV VTI:            0.499 m AV Peak Grad:      24.4 mmHg AV Mean Grad:      13.0 mmHg LVOT Vmax:         99.40 cm/s LVOT Vmean:        68.400 cm/s LVOT VTI:          0.207 m LVOT/AV VTI ratio: 0.41  AORTA Ao Root diam: 3.50 cm MITRAL VALVE                        TRICUSPID VALVE MV Area (PHT): 3.27 cm             TR Peak grad:   26.2 mmHg MV PHT:        67.28 msec           TR Vmax:        256.00 cm/s MV Decel Time: 232 msec MV E velocity: 89.80 cm/s 103 cm/s  SHUNTS MV A velocity: 78.30 cm/s 70.3 cm/s Systemic VTI:  0.21 m MV E/A ratio:  1.15       1.5       Systemic Diam: 1.80 cm  Dina Rich MD Electronically signed by Dina Rich MD Signature Date/Time: 08/26/2019/4:44:12 PM    Final     Cardiac Studies   Echocardiogram: 08/26/2019 IMPRESSIONS    1. Left ventricular ejection fraction, by visual estimation, is 55 to 60%. The left ventricle has normal function. There is no left ventricular hypertrophy.  2. The left ventricle has no regional wall motion abnormalities.  3. Global right ventricle has normal systolic function.The right ventricular size is  normal. No increase in right ventricular wall thickness.  4. Left atrial size was normal.  5. Right atrial size was normal.  6. Moderate  mitral annular calcification.  7. Moderate thickening of the mitral valve leaflet(s).  8. The mitral valve is normal in structure. Trivial mitral valve regurgitation. No evidence of mitral stenosis.  9. The tricuspid valve is normal in structure. 10. The aortic valve has an indeterminant number of cusps. Aortic valve regurgitation is not visualized. Mild to moderate aortic valve stenosis. 11. The pulmonic valve was not well visualized. Pulmonic valve regurgitation is not visualized. 12. Mildly elevated pulmonary artery systolic pressure.  Patient Profile     68 y.o. male w/ PMH of CAD (multivessel CAD by cath in 1999 with medical management pursued), mild to moderate AS, HTN, HLD, history of GIB and alcohol abusewho is currently admitted for worsening weakness, balance issues and AMS. Cardiology consulted as he developed atrial fibrillation with RVR during admission.   Assessment & Plan    1. New-Onset Atrial Fibrillation with RVR -New diagnosis for the patient this admission and occurred in the setting of acute alcohol intoxicationand UTI. He has experienced intermittent episodes of PAF this admission but converted back to NSR last night and remains in NSR at this time. Received Digoxin load with 0.25mg  Q6H and has now been started on 0.125mg  daily. Agree with reducing Lopressor to 25mg  BID given soft BP (was on 50 mg BID prior to admission).  -This patients CHA2DS2-VASc Score and unadjusted Ischemic Stroke Rate (% per year) is equal to3.2 % stroke rate/year from a score of 3(HTN, Vascular, Age).Not a good candidate for anticoagulation given his chronic alcohol abuse, history of GIB and PUD  2. CAD - known CAD by prior catheterization in 1999 but he has denied any anginal symtpoms this admission. Medical management pursued in the past given his alcohol abuse and inability to even tolerate ASA in the past. -BB has been restarted but at a lower dose. Imdur held given hypotension and would  hold at discharge given soft BP. Not on statin therapy prior to admission given alcohol use and elevated LFT's.   3. Acute Diastolic CHF Exacerbation - Echo this admission shows a preserved EF as outlined above. BNP elevated to 1815 on 12/29, at 1326 this AM. Repeat CXR this AM shows mild vascular congestion. He received a 1L fluid bolus during the early AM hours on 12/30 then an additional 1L fluid bolus last night for soft BP. Agree with reducing Lopressor to help with hypotension. He is also receiving Oxycodone Q4H PRN so suspect this is playing a role in his soft BP as well. Will hold on diuretics for now given stable respiratory status and persistent hypotension.    4. Aortic Stenosis - mild to moderate by repeat echocardiogram this admission. Continue to follow as an outpatient.   5. UTI - WBC initially elevated to 20.8, normalized at 8.6 this AM. He was on Levaquin which has not been discontinued. Per admitting team.   6. Acute Alcohol Intoxication - he reported consuming 10-12 beers daily prior to admission. Cessation advised.     For questions or updates, please contact [place] Please consult [email or web address] for contact info under Cardiology/STEMI.   Arna Medici , PA-C 8:15 AM 08/28/2019 Pager: [phone]  Attending note Patient seen and discussed with PA Ahmed Prima, I agree with her documentation above. Afib with RVR has been difficult to manage in the setting of soft bp's. not been started on  anticoag due to history of EtOH abuse, history of GI Bleeding, and chronic thrombocytopenia. With liver disease and inability to anticoagulate some hesitation to start amio. Yesterday started on digoxin 0.25mg  every 6 hours x 4 doses, remains on lopressor 37.5mg  bid though evening dose held. He is back in SR this AM. Will try lowering lopressor to 25mg  bid, start maintence dosing of digoxin 0.125mg  daily. Perhaps in time if bp's improve can titrate up av nodal agent  and d/c digoxin.   Acute diastolic HF, diuresis also limited by soft bp's. Repeat CXR with mild congestion, BNP 1300. Gentle diuresis when bp's allow.   Dina RichJonathan Shawni Volkov MD

## 2019-08-28 NOTE — Progress Notes (Signed)
Physical Therapy Treatment Patient Details Name: Billy Stone MRN: [id number] DOB: 09/16/1950 Today's Date: 08/28/2019    History of Present Illness Billy Stone is a 68 y.o. male with medical history significant of many years of chronic alcohol abuse, hepatitis, alcoholic cirrhosis, coronary artery disease, peptic ulcer disease, history of GI bleeding, GERD, BPH, chronic self-catheterization, multiple hospitalizations due to chronic alcohol abuse presents to the ED today complaining of weakness and difficulty ambulating.  The patient apparently has not seen a physician in about 1 year.  He continues to drink heavily.  The patient reports progressive difficulty with ambulation balance and multiple falls and weakness.  He has a difficult time getting up off the floor after falling.  He drinks alcohol daily and reports that he is having a difficult time even walking to the bathroom from his bedroom.  He reports no head injuries.  He reports that he has had alcohol withdrawal in the past.  Patient denies recreational drug abuse.  He reports that he has lost weight and has had poor appetite and reporting dark urine.  The patient chronically self catheterizes after prostate surgery.  He had been followed by cardiology in the past but not recently.  He was brought into the emergency department by paramedics who reported that his vital signs were unremarkable on arrival but that he was severely ataxic and weak and appeared much older than stated age.  His mentation was also altered.  The patient denies headache, vision changes fever chills.  He denies nausea vomiting and diarrhea.  He does not seem to be a good historian.  He has poor short-term memory.    PT Comments    Pt with decreased mobility and seated balance today compared to last note.  Increased time and min-mod A required with bed mobility.   Upon sitting pt unable to sit up erect, tendency to lean forward due to weakness.  Pt limited by  fatigue following transition to sitting EOB and seated exercises.  Pt limited by fatigue and requested to return to bed.  Pt left in bed with call bell wtihin reach and bed alarm set.  RN aware of status.     Follow Up Recommendations  SNF;Supervision for mobility/OOB;Supervision - Intermittent     Equipment Recommendations  None recommended by PT    Recommendations for Other Services       Precautions / Restrictions Precautions Precautions: Fall Restrictions Weight Bearing Restrictions: No    Mobility  Bed Mobility Overal bed mobility: Needs Assistance Bed Mobility: Supine to Sit Rolling: Mod assist   Supine to sit: Mod assist Sit to supine: Mod assist   General bed mobility comments: Required verbal/tactile cueing to prop on elbows to assist with suping to sit, required min-mod A  Transfers                    Ambulation/Gait                 Stairs             Wheelchair Mobility    Modified Rankin (Stroke Patients Only)       Balance                                            Cognition Arousal/Alertness: Awake/alert Behavior During Therapy: WFL for tasks assessed/performed Overall Cognitive Status: Within Functional  Limits for tasks assessed                                 General Comments: patient limited by fatigue with increased time to complete bed mobility and decreased seated balance required BUE      Exercises General Exercises - Lower Extremity Long Arc Quad: Seated;AROM;Strengthening;Both;10 reps Toe Raises: Seated;AROM;Strengthening;Both;10 reps Heel Raises: Seated;AROM;Strengthening;Both;10 reps    General Comments        Pertinent Vitals/Pain Pain Assessment: No/denies pain    Home Living                      Prior Function            PT Goals (current goals can now be found in the care plan section)      Frequency    Min 3X/week      PT Plan Current plan  remains appropriate    Co-evaluation              AM-PAC PT "6 Clicks" Mobility   Outcome Measure  Help needed turning from your back to your side while in a flat bed without using bedrails?: A Little Help needed moving from lying on your back to sitting on the side of a flat bed without using bedrails?: A Little Help needed moving to and from a bed to a chair (including a wheelchair)?: A Lot Help needed standing up from a chair using your arms (e.g., wheelchair or bedside chair)?: A Lot Help needed to walk in hospital room?: A Lot Help needed climbing 3-5 steps with a railing? : A Lot 6 Click Score: 14    End of Session   Activity Tolerance: Patient limited by fatigue;Patient tolerated treatment well Patient left: in bed;with call bell/phone within reach;with bed alarm set Nurse Communication: Mobility status PT Visit Diagnosis: Unsteadiness on feet (R26.81);Other abnormalities of gait and mobility (R26.89);Muscle weakness (generalized) (M62.81);Repeated falls (R29.6)     Time: 8338-2505 PT Time Calculation (min) (ACUTE ONLY): 30 min  Charges:  $Therapeutic Activity: 23-37 mins                    [address] LPTA; CBIS [phone]  Aldona Lento 08/28/2019, 12:57 PM

## 2019-08-28 NOTE — TOC Progression Note (Signed)
Transition of Care [facility]) - Progression Note    Patient Details  Name: JEVAUN STRICK MRN: [id number] Date of Birth: Jan 06, 1951  [facility]) CM/SW Contact  Weaver Tweed, Chauncey Reading, RN Phone Number: 08/28/2019, 10:59 AM  Clinical Narrative:   During PT eval yesterday, patient did well and was thought that he could go home with home health, however, last night it took 4 nurses to get patient out of chair to the bed. His brother, who he lives with was here to see the difficulty in ambulation. Patient will need SNF. Discussed with brother and patient, patient reluctantly agrees. Will send out referral to all [facility] and [facility], patient agreeable to plan. Will f/u with patient on bed offers.     Expected Discharge Plan: [place] Barriers to Discharge: Continued Medical Work up  Expected Discharge Plan and Services Expected Discharge Plan: [place]   Discharge Planning Services: CM Consult   Living arrangements for the past 2 months: Single Family Home                          Social Determinants of Health (SDOH) Interventions    Readmission Risk Interventions No flowsheet data found.

## 2019-08-28 NOTE — Progress Notes (Signed)
BP 80/32 manually. Mid-level paged and orders given for NS bolus. Will continue to monitor.

## 2019-08-28 NOTE — [place] FL2 (Signed)
[place] MEDICAID FL2 LEVEL OF CARE SCREENING TOOL     IDENTIFICATION  Patient Name: Billy Stone Birthdate: July 21, 1951 Sex: male Admission Date (Current Location): 08/24/2019  [facility] and [place] Number:  [employer] and Address:  [facility],  [address] [place] [address]      Provider Number: [id number]  Attending Physician Name and Address:  Cleora Fleet, MD  Relative Name and Phone Number:       Current Level of Care: Hospital Recommended Level of Care: Skilled Nursing Facility Prior Approval Number: [id number]  Date Approved/Denied:   PASRR Number:    Discharge Plan: SNF    Current Diagnoses: Patient Active Problem List   Diagnosis Date Noted  . Pressure injury of skin 08/26/2019  . Acute fracture of multiple ribs right 5-8 08/26/2019  . Alcoholism (HCC)   . SOB (shortness of breath)   . Closed fracture of five ribs of right side   . Atrial fibrillation with RVR (HCC) 08/25/2019  . Terminal illness 08/25/2019  . Hypotension - soft BPs 08/25/2019  . Elevated brain natriuretic peptide (BNP) level 08/25/2019  . Hypoalbuminemia 08/25/2019  . 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 Goals of care, counseling/discussion   . Palliative care by specialist   . DNR (do not resuscitate) discussion   . Ataxia 08/24/2019  . Self-catheterizes urinary bladder 08/24/2019  . Alcohol intoxication (HCC) 08/24/2019  . Thrombocytopenia (HCC) 08/24/2019  . History of GI bleed 08/24/2019  . Chest pain due to CAD (HCC) 04/23/2019  . Aortic stenosis 04/09/2019  . Dysphagia 09/27/2018  . Loss of weight 02/07/2018  . Constipation 06/27/2017  . GI bleed 05/05/2017  . Acute lower UTI 05/05/2017  . GIB (gastrointestinal bleeding) 05/05/2017  . Fall 03/28/2016  . Macrocytosis 10/17/2015  . Leukocytosis 06/28/2015  . Hyponatremia syndrome 12/26/2014  . Hyperglycemia 12/26/2014  . Alcoholic  hepatitis with ascites   . Cirrhosis of liver (HCC) 12/14/2014  . Hypokalemia 12/14/2014  . Weakness generalized 12/14/2014  . Jaundice 12/14/2014  . Severe protein-calorie malnutrition (HCC) 12/14/2014  . Acute liver failure 12/14/2014  . Ascites   . Hepatitis   . Hyperbilirubinemia   . Hyponatremia   . Bilateral leg edema 06/05/2014  . Arrhythmia 06/05/2014  . Rib fractures 04/23/2014  . UTI (urinary tract infection) 04/23/2014  . Deficiency anemia 10/07/2010  . Edema 04/04/2010  . HYPERTROPHY PROSTATE W/UR OBST & OTH LUTS 09/30/2009  . GLUCOSE INTOLERANCE 07/01/2009  . ALLERGIC RHINITIS 12/23/2007  . Alcohol abuse 01/13/2007  . Essential hypertension 01/05/2007  . CAD (coronary artery disease), native coronary artery 01/05/2007  . HYPONATREMIA, HX OF 01/05/2007  . SPLENECTOMY, HX OF 08/28/1984    Orientation RESPIRATION BLADDER Height & Weight     Self  O2 Continent Weight: 74 kg Height:  5\' 5"  (165.1 cm)  BEHAVIORAL SYMPTOMS/MOOD NEUROLOGICAL BOWEL NUTRITION STATUS      Continent Diet(heart healthy)  AMBULATORY STATUS COMMUNICATION OF NEEDS Skin   Extensive Assist Verbally Normal                       Personal Care Assistance Level of Assistance  Bathing, Dressing, Feeding Bathing Assistance: Limited assistance Feeding assistance: Independent Dressing Assistance: Limited assistance     Functional Limitations Info  Sight, Hearing, Speech Sight Info: Adequate Hearing Info: Adequate Speech Info: Adequate    SPECIAL CARE FACTORS FREQUENCY  PT (By licensed PT)     PT  Frequency: 5x/week              Contractures Contractures Info: Not present    Additional Factors Info  Code Status, Allergies Code Status Info: Full Allergies Info: Amlodipine Besylate, Penicillin           Current Medications (08/28/2019):  This is the current hospital active medication list Current Facility-Administered Medications  Medication Dose Route Frequency Provider  Last Rate Last Admin  . 0.9 %  sodium chloride infusion   Intravenous Continuous Wynetta Emery, Clanford L, MD 10 mL/hr at 08/25/19 1526 Rate Change at 08/25/19 1526  . bisacodyl (DULCOLAX) suppository 10 mg  10 mg Rectal Once Johnson, Clanford L, MD      . Chlorhexidine Gluconate Cloth 2 % PADS 6 each  6 each Topical Daily Wynetta Emery, Clanford L, MD   6 each at 08/27/19 1613  . digoxin (LANOXIN) tablet 0.125 mg  0.125 mg Oral Daily Strader, [place] M, PA-C      . docusate sodium (COLACE) capsule 100 mg  100 mg Oral Daily PRN Ahmed Prima, [place] M, PA-C      . folic acid (FOLVITE) tablet 1 mg  1 mg Oral Daily Johnson, Clanford L, MD   1 mg at 08/28/19 1006  . levalbuterol (XOPENEX) nebulizer solution 0.63 mg  0.63 mg Nebulization Q6H PRN Johnson, Clanford L, MD      . LORazepam (ATIVAN) injection 0-4 mg  0-4 mg Intravenous Q12H Johnson, Clanford L, MD      . MEDLINE mouth rinse  15 mL Mouth Rinse BID Wynetta Emery, Clanford L, MD   15 mL at 08/27/19 2135  . metoprolol tartrate (LOPRESSOR) injection 2.5 mg  2.5 mg Intravenous Q6H PRN Johnson, Clanford L, MD   2.5 mg at 08/25/19 1549  . metoprolol tartrate (LOPRESSOR) tablet 25 mg  25 mg Oral BID Johnson, Clanford L, MD   25 mg at 08/28/19 1005  . multivitamin with minerals tablet 1 tablet  1 tablet Oral Daily Johnson, Clanford L, MD   1 tablet at 08/28/19 1006  . nitroGLYCERIN (NITROSTAT) SL tablet 0.4 mg  0.4 mg Sublingual Q5 min PRN Johnson, Clanford L, MD      . ondansetron (ZOFRAN) tablet 4 mg  4 mg Oral Q6H PRN Johnson, Clanford L, MD       Or  . ondansetron (ZOFRAN) injection 4 mg  4 mg Intravenous Q6H PRN Johnson, Clanford L, MD      . oxyCODONE (Oxy IR/ROXICODONE) immediate release tablet 2.5 mg  2.5 mg Oral Q4H PRN Wynetta Emery, Clanford L, MD   2.5 mg at 08/28/19 0608  . pantoprazole (PROTONIX) EC tablet 40 mg  40 mg Oral Daily Johnson, Clanford L, MD      . sodium chloride tablet 1 g  1 g Oral BID Dove, Tasha A, NP      . thiamine tablet 100 mg  100 mg Oral  Daily Johnson, Clanford L, MD   100 mg at 08/28/19 1006   Or  . thiamine (B-1) injection 100 mg  100 mg Intravenous Daily Johnson, Clanford L, MD         Discharge Medications: Please see discharge summary for a list of discharge medications.  Relevant Imaging Results:  Relevant Lab Results:   Additional Information SSN [id number]  Jerianne Anselmo, Chauncey Reading, RN

## 2019-08-28 NOTE — Progress Notes (Signed)
PROGRESS NOTE [facility]  [name]  XBM:[id number]  DOB: 1951-07-18  DOA: 08/24/2019 PCP: Benita Stabile, MD  Brief Admission Hx: 68 y.o. male with medical history significant of many years of chronic alcohol abuse, hepatitis, alcoholic cirrhosis, coronary artery disease, peptic ulcer disease, history of GI bleeding, GERD, BPH, chronic self-catheterization, multiple hospitalizations due to chronic alcohol abuse presents to the ED today complaining of weakness and difficulty ambulating. He was markedly ataxic.  MDM/Assessment & Plan:   1. Acute alcohol intoxication-TREATED AND RESOLVED.  Patient was actively drinking alcohol on arrival and has been abusing alcohol for many years and has chronic liver disease.  He was treated initially with gentle IV fluid hydration and withdrawal precautions started.  He has been supplemented with vitamins and folic acid and thiamine via banana bag.  Consult Child psychotherapist for alcohol treatment program. 2. Warnicke's encephalopathy with ataxia with progressive weakness and falls at home.  He was treated with high-dose thiamine and his mentation has rapidly improved.  MRI brain completed 12/28 with no acute findings.  CT brain did not show any acute findings.  PT evaluation recommending SNf placement however unsure if patient will go as he desires to return to drinking alcohol at home.     3. Metabolic encephalopathy -Resolving with treatments,  this has been intermittent, his prognosis remains guarded to poor as he develops multiorgan failure, I have asked palliative care team to assist with goals of care discussions.   4. E coli UTI-(pansensitive) he does self catheterize chronically and reported symptoms of burning at the urethra and was treated with IV antibiotic therapy.   5. Generalized weakness-multifactorial given his chronic comorbidities and poor self-care.  PT evaluation requested. 6. Hyponatremia-restarted home sodium  tablets. 7. Hypomagnesemia-IV replacement has been given. 8. Alcoholic cirrhosis-plan to resume home oral diuretics when ok with cardiology. BPs have been soft. 9. Acute rib fractures - He has acute right rib fracture 5-8 likely contributing to his pain and suffering.  Working with family to ask them to consider a comfort care approach to ease suffering but they are not receptive.  10. Atypical pneumonia - seen on CXR, treated with levofloxacin.  11. 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  No active bleeding but not a candidate for full anticoagulation.  12. Urinary incontinence-patient self catheterizes and has been doing this for years which we will allow him to continue while inpatient with the assistance of the RN. 13. Paroxysmal Atrial fibrillation with RVR - now back in SR,  DC'd imdur to give more room for titrating beta blocker, unsafe to start full anticoagulation now with chronic liver disease and severe low platelets.  Cardiology has consulted and started on digoxin.   14. Severe protein calorie malnutrition-we will ask for dietitian consult. 15. Leukocytosis-likely secondary to urinary tract infection.    WBC trended down. 16. Acute respiratory distress - resolved now,  continue palliative care discussions.  17. GERD-Protonix ordered for GI protection. 18. History of upper GI bleeding-protonix ordered as above. 19. Chronic alcoholism-the patient seems to be nearing the end stages of chronic alcohol abuse and liver cirrhosis.  His mortality is very high and I requesting a palliative medicine consultation for goals of care discussions.  He desires to return home to continue drinking.   DVT prophylaxis: SCDs Code Status: Full Family Communication: updated brother by telephone 12/28,12/30 Disposition Plan: Telemetry Consults called: N/A Admission status: Inpatient  Consultants:  Palliative care  Procedures:    Antimicrobials:  Ceftriaxone 12/27  Levofloxacin 12/28>  Subjective: Patient remains very weak but mentation has improved.   Objective: Vitals:   08/28/19 0458 08/28/19 0500 08/28/19 1025 08/28/19 1354  BP: 91/66  123/76 102/63  Pulse: 84   90  Resp: 16   16  Temp: 98.6 F (37 C)   98.4 F (36.9 C)  TempSrc: Oral   Oral  SpO2: 93%   95%  Weight:  74 kg    Height:        Intake/Output Summary (Last 24 hours) at 08/28/2019 1359 Last data filed at 08/28/2019 1300 Gross per 24 hour  Intake 270 ml  Output 1475 ml  Net -1205 ml   Filed Weights   08/24/19 1029 08/27/19 0500 08/28/19 0500  Weight: 70.3 kg 69.8 kg 74 kg   REVIEW OF SYSTEMS  As per history otherwise all reviewed and reported negative  Exam:  General exam: Chronically ill-appearing male he is lying in the bed he is awake and alert he does seem to be in distress.    Respiratory system:  No increased work of breathing. Cardiovascular system: normal S1 & S2 heard. No JVD, murmurs, gallops, clicks or pedal edema. Gastrointestinal system: Abdomen is mildly distended with minimal ascites, soft and nontender. Normal bowel sounds heard. Central nervous system: Alert and oriented. No focal neurological deficits. Extremities: Trace pretibial edema bilateral lower extremities.  Data Reviewed: Basic Metabolic Panel: Recent Labs  Lab 08/24/19 1106 08/25/19 0429 08/26/19 0458 08/27/19 0640 08/28/19 0557  NA 132* 135 133* 136 130*  K 4.0 3.8 4.2 3.8 3.8  CL 96* 99 99 100 97*  CO2 GLUCOSE 101* 97 111* 107* 99  BUN CREATININE 0.79 0.75 0.72 0.75 0.92  CALCIUM 8.1* 8.0* 8.4* 8.4* 8.4*  MG 1.2* 1.7 1.8 1.5* 2.0  PHOS 3.3  --   --   --   --    Liver Function Tests: Recent Labs  Lab 08/24/19 1106 08/25/19 0429 08/26/19 0458 08/27/19 0640 08/28/19 0557  AST 48* 52* 93* 72* 51*  ALT ALKPHOS 96 77 76 58 60  BILITOT 3.1* 3.9* 2.5* 2.0*  2.0*  PROT 8.1 7.2 7.5 6.7 6.4*  ALBUMIN 2.9* 2.4* 2.5* 2.1* 2.0*   Recent Labs  Lab 08/24/19 1106  LIPASE 31   Recent Labs  Lab 08/24/19 1201  AMMONIA 39*   CBC: Recent Labs  Lab 08/24/19 1106 08/25/19 0429 08/26/19 0458 08/27/19 0640 08/28/19 0557  WBC 12.0* 20.8* 12.7* 11.0* 8.6  NEUTROABS 9.3* 17.1* 10.6* 7.7 4.9  HGB 11.0* 9.8* 10.7* 9.8* 9.5*  HCT 31.8* 29.9* 33.0* 29.9* 28.5*  MCV 106.7* 111.2* 113.0* 111.6* 110.5*  PLT 71* 59* 67* 84* 73*   Cardiac Enzymes: No results for input(s): CKTOTAL, CKMB, CKMBINDEX, TROPONINI in the last 168 hours. CBG (last 3)  No results for input(s): GLUCAP in the last 72 hours. Recent Results (from the past 240 hour(s))  Urine culture     Status: Abnormal   Collection Time: 08/24/19 11:35 AM   Specimen: Urine, Clean Catch  Result Value Ref Range Status   Specimen Description   Final    URINE, CLEAN CATCH Performed at [facility], [address] [place], [place] [address]    Special Requests   Final    NONE Performed at [facility], [address] [place], [place] [address]  Culture >=100,000 COLONIES/mL ESCHERICHIA COLI (A)  Final   Report Status 08/27/2019 FINAL  Final   Organism ID, Bacteria ESCHERICHIA COLI (A)  Final      Susceptibility   Escherichia coli - MIC*    AMPICILLIN 4 SENSITIVE Sensitive     CEFAZOLIN <=4 SENSITIVE Sensitive     CEFTRIAXONE <=0.25 SENSITIVE Sensitive     CIPROFLOXACIN <=0.25 SENSITIVE Sensitive     GENTAMICIN <=1 SENSITIVE Sensitive     IMIPENEM <=0.25 SENSITIVE Sensitive     NITROFURANTOIN <=16 SENSITIVE Sensitive     TRIMETH/SULFA <=20 SENSITIVE Sensitive     AMPICILLIN/SULBACTAM <=2 SENSITIVE Sensitive     PIP/TAZO <=4 SENSITIVE Sensitive     * >=100,000 COLONIES/mL ESCHERICHIA COLI  SARS CORONAVIRUS 2 (TAT 6-24 HRS) Nasopharyngeal Nasopharyngeal Swab     Status: None   Collection Time: 08/24/19  8:36 PM   Specimen: Nasopharyngeal Swab  Result Value Ref Range Status   SARS  Coronavirus 2 NEGATIVE NEGATIVE Final    Comment: (NOTE) SARS-CoV-2 target nucleic acids are NOT DETECTED. The SARS-CoV-2 RNA is generally detectable in upper and lower respiratory specimens during the acute phase of infection. Negative results do not preclude SARS-CoV-2 infection, do not rule out co-infections with other pathogens, and should not be used as the sole basis for treatment or other patient management decisions. Negative results must be combined with clinical observations, patient history, and epidemiological information. The expected result is Negative. Fact Sheet for Patients: HairSlick.nohttps://www.fda.gov/media/138098/download Fact Sheet for Healthcare Providers: quierodirigir.comhttps://www.fda.gov/media/138095/download This test is not yet approved or cleared by the [place] FDA and  has been authorized for detection and/or diagnosis of SARS-CoV-2 by FDA under an Emergency Use Authorization (EUA). This EUA will remain  in effect (meaning this test can be used) for the duration of the COVID-19 declaration under Section 56 4(b)(1) of the Act, 21 U.S.C. section 360bbb-3(b)(1), unless the authorization is terminated or revoked sooner. Performed at [facility], [address] [place], [place] [address]      Studies: DG Chest Port 1 View  Result Date: 08/28/2019 CLINICAL DATA:  Shortness of breath and wheezing EXAM: PORTABLE CHEST 1 VIEW COMPARISON:  08/26/2019 FINDINGS: Cardiac shadow is stable. Aortic calcifications are seen. The lungs are well aerated bilaterally. Some slight increase in central vascular congestion is noted. Healed posterior right rib fractures are noted with new lateral right rib fracture stable from the prior study. No new bony abnormality is seen. IMPRESSION: Mild vascular congestion. Multiple right rib fractures laterally. Electronically Signed   By: Alcide CleverMark  Lukens M.D.   On: 08/28/2019 07:15   ECHOCARDIOGRAM COMPLETE  Result Date: 08/26/2019   ECHOCARDIOGRAM  REPORT   Patient Name:   Billy Stone Date of Exam: 08/26/2019 Medical Rec #:  [id number]       Height:       65.0 in Accession #:    [id number]      Weight:       155.0 lb Date of Birth:  10-Mar-1951       BSA:          1.78 m Patient Age:    68 years        BP:           114/60 mmHg Patient Gender: M               HR:           89 bpm. [facility] Procedure: 2D Echo,  Cardiac Doppler and Color Doppler Indications:    Aortic Stenosis 424.1 / 135.0  History:        Patient has prior history of Echocardiogram examinations, most                 recent 08/20/2017. CAD, Arrythmias:Atrial Fibrillation; Risk                 Factors:Hypertension. Acute liver failure,Alcohol abuse,.  Sonographer:    Celesta Gentile RCS Referring Phys: [id number] Ellsworth Lennox  Sonographer Comments: Patient could NOT follow commands to "sniff". IMPRESSIONS  1. Left ventricular ejection fraction, by visual estimation, is 55 to 60%. The left ventricle has normal function. There is no left ventricular hypertrophy.  2. The left ventricle has no regional wall motion abnormalities.  3. Global right ventricle has normal systolic function.The right ventricular size is normal. No increase in right ventricular wall thickness.  4. Left atrial size was normal.  5. Right atrial size was normal.  6. Moderate mitral annular calcification.  7. Moderate thickening of the mitral valve leaflet(s).  8. The mitral valve is normal in structure. Trivial mitral valve regurgitation. No evidence of mitral stenosis.  9. The tricuspid valve is normal in structure. 10. The aortic valve has an indeterminant number of cusps. Aortic valve regurgitation is not visualized. Mild to moderate aortic valve stenosis. 11. The pulmonic valve was not well visualized. Pulmonic valve regurgitation is not visualized. 12. Mildly elevated pulmonary artery systolic pressure. FINDINGS  Left Ventricle: Left ventricular ejection fraction, by visual estimation, is 55 to 60%.  The left ventricle has normal function. The left ventricle has no regional wall motion abnormalities. There is no left ventricular hypertrophy. Left ventricular diastolic parameters were normal. Right Ventricle: The right ventricular size is normal. No increase in right ventricular wall thickness. Global RV systolic function is has normal systolic function. The tricuspid regurgitant velocity is 2.56 m/s, and with an assumed right atrial pressure  of 10 mmHg, the estimated right ventricular systolic pressure is mildly elevated at 36.2 mmHg. Left Atrium: Left atrial size was normal in size. Right Atrium: Right atrial size was normal in size Pericardium: There is no evidence of pericardial effusion. Mitral Valve: The mitral valve is normal in structure. There is moderate thickening of the mitral valve leaflet(s). Moderate mitral annular calcification. Trivial mitral valve regurgitation. No evidence of mitral valve stenosis by observation. Tricuspid Valve: The tricuspid valve is normal in structure. Tricuspid valve regurgitation is mild. Aortic Valve: The aortic valve has an indeterminant number of cusps. . There is moderate thickening and moderate calcification of the aortic valve. Aortic valve regurgitation is not visualized. Mild to moderate aortic stenosis is present. Moderate aortic  valve annular calcification. There is moderate thickening of the aortic valve. There is moderate calcification of the aortic valve. Aortic valve mean gradient measures 13.0 mmHg. Aortic valve peak gradient measures 24.4 mmHg. Aortic valve area, by VTI measures 1.06 cm. Pulmonic Valve: The pulmonic valve was not well visualized. Pulmonic valve regurgitation is not visualized. Pulmonic regurgitation is not visualized. No evidence of pulmonic stenosis. Aorta: The aortic root is normal in size and structure. Pulmonary Artery: There is moderate pulmonary hypertension, the PASP is 41 mmHg. IAS/Shunts: No atrial level shunt detected by color  flow Doppler.  LEFT VENTRICLE PLAX 2D LVIDd:         3.77 cm       Diastology LVIDs:         2.68 cm  LV e' lateral:   11.20 cm/s LV PW:         0.93 cm       LV E/e' lateral: 8.0 LV IVS:        1.10 cm       LV e' medial:    8.05 cm/s LVOT diam:     1.80 cm       LV E/e' medial:  11.2 LV SV:         34 ml LV SV Index:   18.94 LVOT Area:     2.54 cm  LV Volumes (MOD) LV area d, A2C:    26.20 cm LV area d, A4C:    23.50 cm LV area s, A2C:    12.10 cm LV area s, A4C:    12.40 cm LV major d, A2C:   8.02 cm LV major d, A4C:   7.51 cm LV major s, A2C:   6.38 cm LV major s, A4C:   6.37 cm LV vol d, MOD A2C: 71.6 ml LV vol d, MOD A4C: 60.4 ml LV vol s, MOD A2C: 20.0 ml LV vol s, MOD A4C: 20.3 ml LV SV MOD A2C:     51.6 ml LV SV MOD A4C:     60.4 ml LV SV MOD BP:      47.7 ml RIGHT VENTRICLE RV S prime:     13.40 cm/s TAPSE (M-mode): 1.8 cm LEFT ATRIUM             Index       RIGHT ATRIUM           Index LA diam:        2.90 cm 1.63 cm/m  RA Area:     14.60 cm LA Vol (A2C):   52.5 ml 29.58 ml/m RA Volume:   31.80 ml  17.92 ml/m LA Vol (A4C):   37.4 ml 21.07 ml/m LA Biplane Vol: 45.4 ml 25.58 ml/m  AORTIC VALVE AV Area (Vmax):    1.02 cm AV Area (Vmean):   1.04 cm AV Area (VTI):     1.06 cm AV Vmax:           247.00 cm/s AV Vmean:          168.000 cm/s AV VTI:            0.499 m AV Peak Grad:      24.4 mmHg AV Mean Grad:      13.0 mmHg LVOT Vmax:         99.40 cm/s LVOT Vmean:        68.400 cm/s LVOT VTI:          0.207 m LVOT/AV VTI ratio: 0.41  AORTA Ao Root diam: 3.50 cm MITRAL VALVE                        TRICUSPID VALVE MV Area (PHT): 3.27 cm             TR Peak grad:   26.2 mmHg MV PHT:        67.28 msec           TR Vmax:        256.00 cm/s MV Decel Time: 232 msec MV E velocity: 89.80 cm/s 103 cm/s  SHUNTS MV A velocity: 78.30 cm/s 70.3 cm/s Systemic VTI:  0.21 m MV E/A ratio:  1.15       1.5       Systemic Diam:  1.80 cm  Carlyle Dolly MD Electronically signed by Carlyle Dolly MD Signature  Date/Time: 08/26/2019/4:44:12 PM    Final      Scheduled Meds: . bisacodyl  10 mg Rectal Once  . Chlorhexidine Gluconate Cloth  6 each Topical Daily  . digoxin  0.125 mg Oral Daily  . folic acid  1 mg Oral Daily  . LORazepam  0-4 mg Intravenous Q12H  . mouth rinse  15 mL Mouth Rinse BID  . metoprolol tartrate  25 mg Oral BID  . multivitamin with minerals  1 tablet Oral Daily  . pantoprazole  40 mg Oral Daily  . sodium chloride  1 g Oral BID  . thiamine  100 mg Oral Daily   Or  . thiamine  100 mg Intravenous Daily   Continuous Infusions: . sodium chloride 10 mL/hr at 08/25/19 1526    Principal Problem:   Ataxia Active Problems:   GLUCOSE INTOLERANCE   Alcohol abuse   Essential hypertension   CAD (coronary artery disease), native coronary artery   UTI (urinary tract infection)   Arrhythmia   Cirrhosis of liver (HCC)   Hypokalemia   Weakness generalized   Jaundice   Ascites   Hepatitis   Severe protein-calorie malnutrition (HCC)   Acute liver failure   Hyperbilirubinemia   Hyponatremia   Leukocytosis   Loss of weight   Dysphagia   Aortic stenosis   Self-catheterizes urinary bladder   Alcohol intoxication (HCC)   Thrombocytopenia (HCC)   History of GI bleed   Atrial fibrillation with RVR ([place])   Goals of care, counseling/discussion   Palliative care by specialist   DNR (do not resuscitate) discussion   Terminal illness   Hypotension - soft BPs   Elevated brain natriuretic peptide (BNP) level   Hypoalbuminemia   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  1. Check the care team in [facility] and look for a) attending/consulting TR[facility] provider listed and b) the [facility] team listed 2. Log into [email or web address] and use [place]'s universal password to access. If you do not have the password, please contact the hospital operator. 3. Locate the [facility] provider you are looking for under [facility] and page to a number that you can be directly reached. 4. If you still have difficulty reaching the provider, please page the [facility] (Director on Call) for the Hospitalists listed on amion for assistance.

## 2019-08-29 LAB — MAGNESIUM: Magnesium: 1.7 mg/dL (ref 1.7–2.4)

## 2019-08-29 LAB — CBC WITH DIFFERENTIAL/PLATELET
Abs Immature Granulocytes: 0.06 10*3/uL (ref 0.00–0.07)
Basophils Absolute: 0.1 10*3/uL (ref 0.0–0.1)
Basophils Relative: 1 %
Eosinophils Absolute: 0.4 10*3/uL (ref 0.0–0.5)
Eosinophils Relative: 5 %
HCT: 31.3 % — ABNORMAL LOW (ref 39.0–52.0)
Hemoglobin: 10.3 g/dL — ABNORMAL LOW (ref 13.0–17.0)
Immature Granulocytes: 1 %
Lymphocytes Relative: 29 %
Lymphs Abs: 2.4 10*3/uL (ref 0.7–4.0)
MCH: 36.8 pg — ABNORMAL HIGH (ref 26.0–34.0)
MCHC: 32.9 g/dL (ref 30.0–36.0)
MCV: 111.8 fL — ABNORMAL HIGH (ref 80.0–100.0)
Monocytes Absolute: 1.4 10*3/uL — ABNORMAL HIGH (ref 0.1–1.0)
Monocytes Relative: 16 %
Neutro Abs: 4.1 10*3/uL (ref 1.7–7.7)
Neutrophils Relative %: 48 %
Platelets: 82 10*3/uL — ABNORMAL LOW (ref 150–400)
RBC: 2.8 MIL/uL — ABNORMAL LOW (ref 4.22–5.81)
RDW: 14.9 % (ref 11.5–15.5)
WBC: 8.5 10*3/uL (ref 4.0–10.5)
nRBC: 0.7 % — ABNORMAL HIGH (ref 0.0–0.2)

## 2019-08-29 LAB — BASIC METABOLIC PANEL
Anion gap: 6 (ref 5–15)
BUN: 18 mg/dL (ref 8–23)
CO2: 23 mmol/L (ref 22–32)
Calcium: 8 mg/dL — ABNORMAL LOW (ref 8.9–10.3)
Chloride: 99 mmol/L (ref 98–111)
Creatinine, Ser: 0.81 mg/dL (ref 0.61–1.24)
GFR calc Af Amer: >60 mL/min (ref >60)
GFR calc non Af Amer: >60 mL/min (ref >60)
Glucose, Bld: 116 mg/dL — ABNORMAL HIGH (ref 70–99)
Potassium: 3.8 mmol/L (ref 3.5–5.1)
Sodium: 128 mmol/L — ABNORMAL LOW (ref 135–145)

## 2019-08-29 LAB — HEPATIC FUNCTION PANEL
ALT: 14 U/L (ref 0–44)
AST: 42 U/L — ABNORMAL HIGH (ref 15–41)
Albumin: 1.9 g/dL — ABNORMAL LOW (ref 3.5–5.0)
Alkaline Phosphatase: 67 U/L (ref 38–126)
Bilirubin, Direct: 0.7 mg/dL — ABNORMAL HIGH (ref 0.0–0.2)
Indirect Bilirubin: 1.1 mg/dL — ABNORMAL HIGH (ref 0.3–0.9)
Total Bilirubin: 1.8 mg/dL — ABNORMAL HIGH (ref 0.3–1.2)
Total Protein: 6.2 g/dL — ABNORMAL LOW (ref 6.5–8.1)

## 2019-08-29 LAB — PROTIME-INR
INR: 1.6 — ABNORMAL HIGH (ref 0.8–1.2)
Prothrombin Time: 18.6 seconds — ABNORMAL HIGH (ref 11.4–15.2)

## 2019-08-29 MED ORDER — SODIUM CHLORIDE 1 G PO TABS
1.0000 g | ORAL_TABLET | Freq: Three times a day (TID) | ORAL | Status: DC
  Administered 2019-08-29 – 2019-09-01 (×7): 1 g via ORAL
  Filled 2019-08-29 (×4): qty 1

## 2019-08-29 MED ORDER — GUAIFENESIN-DM 100-10 MG/5ML PO SYRP
5.0000 mL | ORAL_SOLUTION | ORAL | Status: DC | PRN
  Administered 2019-08-29 – 2019-08-31 (×2): 5 mL via ORAL
  Filled 2019-08-29 (×2): qty 5

## 2019-08-29 MED ORDER — FUROSEMIDE 10 MG/ML IJ SOLN
40.0000 mg | Freq: Once | INTRAMUSCULAR | Status: AC
  Administered 2019-08-29: 40 mg via INTRAVENOUS
  Filled 2019-08-29: qty 4

## 2019-08-29 NOTE — Care Management Important Message (Signed)
Important Message  Patient Details  Name: Billy Stone MRN: [id number] Date of Birth: August 31, 1950   Medicare Important Message Given:  Yes(RN will deliver to patient)     Corey Harold 08/29/2019, 2:54 PM

## 2019-08-29 NOTE — Progress Notes (Addendum)
PROGRESS NOTE    Billy Stone  UEA:[id number] DOB: [birth year] DOA: 08/24/2019 PCP: Celene Squibb, MD   Brief Narrative:  69 y.o.malewith medical history significant ofmany years of chronic alcohol abuse, hepatitis, alcoholic cirrhosis, coronary artery disease, peptic ulcer disease, history of GI bleeding, GERD, BPH, chronic self-catheterization, multiple hospitalizations due to chronic alcohol abuse presents to the ED today complaining of weakness and difficulty ambulating. He was markedly ataxic.  1/1: Patient continues to do well this morning and has some worsening hyponatremia for which increase in sodium chloride has been ordered.  Currently awaiting SNF placement.  Patient has chronic Foley catheter.  Will give 1 dose of IV Lasix today given some congestion on chest x-ray noted 12/31.  Assessment & Plan:   Principal Problem:   Ataxia Active Problems:   GLUCOSE INTOLERANCE   Alcohol abuse   Essential hypertension   CAD (coronary artery disease), native coronary artery   UTI (urinary tract infection)   Arrhythmia   Cirrhosis of liver (HCC)   Hypokalemia   Weakness generalized   Jaundice   Ascites   Hepatitis   Severe protein-calorie malnutrition (HCC)   Acute liver failure   Hyperbilirubinemia   Hyponatremia   Leukocytosis   Loss of weight   Dysphagia   Aortic stenosis   Self-catheterizes urinary bladder   Alcohol intoxication (HCC)   Thrombocytopenia (HCC)   History of GI bleed   Atrial fibrillation with RVR ([place])   Goals of care, counseling/discussion   Palliative care by specialist   DNR (do not resuscitate) discussion   Terminal illness   Hypotension - soft BPs   Elevated brain natriuretic peptide (BNP) level   Hypoalbuminemia   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 Acute alcohol intoxication-TREATED AND RESOLVED.  Patient was actively drinking alcohol on arrival and has been abusing alcohol for many years and has chronic liver disease. He was treated initially with gentle IV fluid hydration and withdrawal precautions started. He has been supplemented with vitamins and folic acid and thiamine via banana bag.  Consult [facility], [facility] for alcohol treatment program. 2. Warnicke's encephalopathy with ataxia with progressive weakness and falls at home.  He was treated with high-dose thiamine and his mentation has rapidly improved. MRI brain completed 12/28 with no acute findings. CT brain did not show any acute findings.  PT evaluation recommending SNf placement with placement pending.   3. Metabolic encephalopathy -Resolving with treatments,  this has been intermittent, his prognosis remains guarded to poor as he develops multiorgan failure, I have asked palliative care team to assist with goals of care discussions.   4. E coli UTI-(pansensitive) he does self catheterize chronically and reported symptoms of burning at the urethra and was treated with IV antibiotic therapy.  5. Generalized weakness-multifactorial given his chronic comorbidities and poor self-care. PT evaluation requested. 6. Hyponatremia-restarted home sodium tablets with increase in frequency to 3 times daily 7. Hypomagnesemia-resolved after IV replacement.  Recheck in a.m. 8. Alcoholic cirrhosis-plan to resume home oral diuretics when ok with cardiology. BPs have been soft. 9. Acute rib fractures - He has acute right rib fracture 5-8 likely contributing to his pain and suffering.  Working with family to ask them to consider a comfort care approach to  ease suffering but they are not receptive.  10. Atypical pneumonia - seen on CXR, ongoing treatment with levofloxacin day 5.  Will discontinue after today. 11. 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  No active bleeding but not a candidate for full anticoagulation.  12. Urinary incontinence-patient self catheterizes and has been doing this for years which we will allow him to continue while inpatient with the assistance of the RN. 13. Paroxysmal Atrial fibrillation with RVR - now back in SR,  DC'd imdur to give more room for titrating beta blocker, unsafe to start full anticoagulation now with chronic liver disease and severe low platelets.  Cardiology has consulted and started on digoxin.   14. Severe protein calorie malnutrition-we will ask for dietitian consult. 15. Leukocytosis-likely secondary to urinary tract infection.   WBC trended down. 16. Acute respiratory distress - resolved now,  continue palliative care discussions.  17. GERD-Protonix ordered for GI protection. 18. History of upper GI bleeding-protonix ordered as above. 19. Chronic alcoholism-the patient seems to be nearing the end stages of chronic alcohol abuse and liver cirrhosis. His mortality is very high and I requesting a palliative medicine consultation for goals of care discussions.  He desires to return home to continue drinking.    DVT prophylaxis:SCDs Code Status: Full Family Communication: Will plan to update brother Disposition Plan: Continue monitoring while awaiting SNF placement.   Consultants:   Palliative care  Procedures:   None  Antimicrobials:  Anti-infectives (From admission, onward)   Start     Dose/Rate Route Frequency Ordered Stop   08/26/19 1800  levofloxacin (LEVAQUIN) IVPB 750 mg     750 mg 100 mL/hr over 90 Minutes Intravenous Every 24 hours 08/26/19 1116 08/27/19 2129   08/24/19 1730  levofloxacin (LEVAQUIN) IVPB 750 mg     750 mg 100 mL/hr over 90 Minutes Intravenous Every 24 hours 08/24/19 1725 08/25/19 2006   08/24/19 1345  cefTRIAXone (ROCEPHIN) 1 g in sodium chloride 0.9 % 100 mL IVPB     1 g 200 mL/hr over 30 Minutes Intravenous   Once 08/24/19 1341 08/24/19 1704       Subjective: Patient seen and evaluated today with no new acute complaints or concerns. No acute concerns or events noted overnight.  Objective: Vitals:   08/28/19 1354 08/28/19 2039 08/29/19 0514 08/29/19 0811  BP: 102/63 126/79 123/74   Pulse: 90 79 76   Resp: 16 16 18    Temp: 98.4 F (36.9 C) 98.3 F (36.8 C) 98.5 F (36.9 C)   TempSrc: Oral Oral Oral   SpO2: 95% 99% 100% 98%  Weight:   72.2 kg   Height:        Intake/Output Summary (Last 24 hours) at 08/29/2019 1408 Last data filed at 08/29/2019 0900 Gross per 24 hour  Intake 240 ml  Output 850 ml  Net -610 ml   Filed Weights   08/27/19 0500 08/28/19 0500 08/29/19 0514  Weight: 69.8 kg 74 kg 72.2 kg    Examination:  General exam: Appears calm and comfortable  Respiratory system: Clear to auscultation. Respiratory effort normal. Cardiovascular system: S1 & S2 heard, RRR. No JVD, murmurs, rubs, gallops or clicks. No pedal edema. Gastrointestinal system: Abdomen is nondistended, soft and nontender. No organomegaly or masses felt. Normal bowel sounds heard. Central nervous system: Alert and oriented. No focal neurological deficits. Extremities: Symmetric 5 x 5 power. Skin: No rashes, lesions or ulcers Psychiatry: Judgement and insight appear normal.  Mood & affect appropriate.     Data Reviewed: I have personally reviewed following labs and imaging studies  CBC: Recent Labs  Lab 08/25/19 0429 08/26/19 0458 08/27/19 0640 08/28/19 0557 08/29/19 0528  WBC 20.8* 12.7* 11.0* 8.6 8.5  NEUTROABS 17.1* 10.6* 7.7 4.9 4.1  HGB 9.8* 10.7* 9.8* 9.5* 10.3*  HCT 29.9* 33.0* 29.9* 28.5* 31.3*  MCV 111.2* 113.0* 111.6* 110.5* 111.8*  PLT 59* 67* 84* 73* 82*   Basic Metabolic Panel: Recent Labs  Lab 08/24/19 1106 08/25/19 0429 08/26/19 0458 08/27/19 0640 08/28/19 0557 08/29/19 0528  NA 132* 135 133* 136 130* 128*  K 4.0 3.8 4.2 3.8 3.8 3.8  CL 96* 99 99 100 97* 99  CO2 24  23 23 25 22 23   GLUCOSE 101* 97 111* 107* 99 116*  BUN 10 11 14 16 21 18   CREATININE 0.79 0.75 0.72 0.75 0.92 0.81  CALCIUM 8.1* 8.0* 8.4* 8.4* 8.4* 8.0*  MG 1.2* 1.7 1.8 1.5* 2.0 1.7  PHOS 3.3  --   --   --   --   --    GFR: Estimated Creatinine Clearance: 73.8 mL/min (by C-G formula based on SCr of 0.81 mg/dL). Liver Function Tests: Recent Labs  Lab 08/25/19 0429 08/26/19 0458 08/27/19 0640 08/28/19 0557 08/29/19 0528  AST 52* 93* 72* 51* 42*  ALT 15 18 18 16 14   ALKPHOS 77 76 58 60 67  BILITOT 3.9* 2.5* 2.0* 2.0* 1.8*  PROT 7.2 7.5 6.7 6.4* 6.2*  ALBUMIN 2.4* 2.5* 2.1* 2.0* 1.9*   Recent Labs  Lab 08/24/19 1106  LIPASE 31   Recent Labs  Lab 08/24/19 1201  AMMONIA 39*   Coagulation Profile: Recent Labs  Lab 08/25/19 0429 08/26/19 0458 08/27/19 0640 08/28/19 0557 08/29/19 0528  INR 1.4* 1.5* 1.6* 1.4* 1.6*   Cardiac Enzymes: No results for input(s): CKTOTAL, CKMB, CKMBINDEX, TROPONINI in the last 168 hours. BNP (last 3 results) No results for input(s): PROBNP in the last 8760 hours. HbA1C: No results for input(s): HGBA1C in the last 72 hours. CBG: Recent Labs  Lab 08/24/19 1113  GLUCAP 94   Lipid Profile: No results for input(s): CHOL, HDL, LDLCALC, TRIG, CHOLHDL, LDLDIRECT in the last 72 hours. Thyroid Function Tests: No results for input(s): TSH, T4TOTAL, FREET4, T3FREE, THYROIDAB in the last 72 hours. Anemia Panel: No results for input(s): VITAMINB12, FOLATE, FERRITIN, TIBC, IRON, RETICCTPCT in the last 72 hours. Sepsis Labs: No results for input(s): PROCALCITON, LATICACIDVEN in the last 168 hours.  Recent Results (from the past 240 hour(s))  Urine culture     Status: Abnormal   Collection Time: 08/24/19 11:35 AM   Specimen: Urine, Clean Catch  Result Value Ref Range Status   Specimen Description   Final    URINE, CLEAN CATCH Performed at [facility], [address], [place], [facility] [address]    Special Requests   Final     NONE Performed at [facility], [address], [place], [facility] [address]    Culture >=100,000 COLONIES/mL ESCHERICHIA COLI (A)  Final   Report Status 08/27/2019 FINAL  Final   Organism ID, Bacteria ESCHERICHIA COLI (A)  Final      Susceptibility   Escherichia coli - MIC*    AMPICILLIN 4 SENSITIVE Sensitive     CEFAZOLIN <=4 SENSITIVE Sensitive     CEFTRIAXONE <=0.25 SENSITIVE Sensitive     CIPROFLOXACIN <=0.25 SENSITIVE Sensitive     GENTAMICIN <=1 SENSITIVE Sensitive     IMIPENEM <=0.25  SENSITIVE Sensitive     NITROFURANTOIN <=16 SENSITIVE Sensitive     TRIMETH/SULFA <=20 SENSITIVE Sensitive     AMPICILLIN/SULBACTAM <=2 SENSITIVE Sensitive     PIP/TAZO <=4 SENSITIVE Sensitive     * >=100,000 COLONIES/mL ESCHERICHIA COLI  SARS CORONAVIRUS 2 (TAT 6-24 HRS) Nasopharyngeal Nasopharyngeal Swab     Status: None   Collection Time: 08/24/19  8:36 PM   Specimen: Nasopharyngeal Swab  Result Value Ref Range Status   SARS Coronavirus 2 NEGATIVE NEGATIVE Final    Comment: (NOTE) SARS-CoV-2 target nucleic acids are NOT DETECTED. The SARS-CoV-2 RNA is generally detectable in upper and lower respiratory specimens during the acute phase of infection. Negative results do not preclude SARS-CoV-2 infection, do not rule out co-infections with other pathogens, and should not be used as the sole basis for treatment or other patient management decisions. Negative results must be combined with clinical observations, patient history, and epidemiological information. The expected result is Negative. Fact Sheet for Patients: HairSlick.no Fact Sheet for Healthcare Providers: quierodirigir.com This test is not yet approved or cleared by the [place] FDA and  has been authorized for detection and/or diagnosis of SARS-CoV-2 by FDA under an Emergency Use Authorization (EUA). This EUA will remain  in effect (meaning this test can be used) for the  duration of the COVID-19 declaration under Section 56 4(b)(1) of the Act, 21 U.S.C. section 360bbb-3(b)(1), unless the authorization is terminated or revoked sooner. Performed at [facility], [address] [place], [place] [address]          Radiology Studies: DG Chest Port 1 View  Result Date: 08/28/2019 CLINICAL DATA:  Shortness of breath and wheezing EXAM: PORTABLE CHEST 1 VIEW COMPARISON:  08/26/2019 FINDINGS: Cardiac shadow is stable. Aortic calcifications are seen. The lungs are well aerated bilaterally. Some slight increase in central vascular congestion is noted. Healed posterior right rib fractures are noted with new lateral right rib fracture stable from the prior study. No new bony abnormality is seen. IMPRESSION: Mild vascular congestion. Multiple right rib fractures laterally. Electronically Signed   By: Alcide Clever M.D.   On: 08/28/2019 07:15        Scheduled Meds: . bisacodyl  10 mg Rectal Once  . Chlorhexidine Gluconate Cloth  6 each Topical Daily  . digoxin  0.125 mg Oral Daily  . folic acid  1 mg Oral Daily  . mouth rinse  15 mL Mouth Rinse BID  . metoprolol tartrate  25 mg Oral BID  . multivitamin with minerals  1 tablet Oral Daily  . pantoprazole  40 mg Oral Daily  . sodium chloride  1 g Oral TID WC  . thiamine  100 mg Oral Daily   Or  . thiamine  100 mg Intravenous Daily   Continuous Infusions: . sodium chloride 10 mL/hr at 08/25/19 1526     LOS: 5 days    Time spent: 30 minutes    Maleeha Halls Hoover Brunette, DO [facility] Pager [phone]  If 7PM-7AM, please contact night-coverage [email or web address] Password [facility] 08/29/2019, 2:08 PM

## 2019-08-29 NOTE — TOC Progression Note (Signed)
Transition of Care [facility]) - Progression Note    Patient Details  Name: GLEB MCGUIRE MRN: [id number] Date of Birth: 09/23/1950  Transition of Care [facility]) CM/SW Contact  Brayden Brodhead, Chrystine Oiler, RN Phone Number: 08/29/2019, 11:26 AM  Clinical Narrative:   Left VM with Mikel Cella accepting bed offer and to inquire on when patient can transfer to facility.     Expected Discharge Plan: Skilled Nursing Facility Barriers to Discharge: Continued Medical Work up  Expected Discharge Plan and Services Expected Discharge Plan: Skilled Nursing Facility   Discharge Planning Services: CM Consult   Living arrangements for the past 2 months: Single Family Home                                       Social Determinants of Health (SDOH) Interventions    Readmission Risk Interventions No flowsheet data found.

## 2019-08-30 LAB — BASIC METABOLIC PANEL
Anion gap: 10 (ref 5–15)
BUN: 11 mg/dL (ref 8–23)
CO2: 28 mmol/L (ref 22–32)
Calcium: 8 mg/dL — ABNORMAL LOW (ref 8.9–10.3)
Chloride: 94 mmol/L — ABNORMAL LOW (ref 98–111)
Creatinine, Ser: 0.61 mg/dL (ref 0.61–1.24)
GFR calc Af Amer: >60 mL/min (ref >60)
GFR calc non Af Amer: >60 mL/min (ref >60)
Glucose, Bld: 109 mg/dL — ABNORMAL HIGH (ref 70–99)
Potassium: 3.6 mmol/L (ref 3.5–5.1)
Sodium: 132 mmol/L — ABNORMAL LOW (ref 135–145)

## 2019-08-30 LAB — CBC
HCT: 31.9 % — ABNORMAL LOW (ref 39.0–52.0)
Hemoglobin: 10.7 g/dL — ABNORMAL LOW (ref 13.0–17.0)
MCH: 36.4 pg — ABNORMAL HIGH (ref 26.0–34.0)
MCHC: 33.5 g/dL (ref 30.0–36.0)
MCV: 108.5 fL — ABNORMAL HIGH (ref 80.0–100.0)
Platelets: 99 10*3/uL — ABNORMAL LOW (ref 150–400)
RBC: 2.94 MIL/uL — ABNORMAL LOW (ref 4.22–5.81)
RDW: 14.5 % (ref 11.5–15.5)
WBC: 10 10*3/uL (ref 4.0–10.5)
nRBC: 0.6 % — ABNORMAL HIGH (ref 0.0–0.2)

## 2019-08-30 LAB — VITAMIN B1: Vitamin B1 (Thiamine): 96.7 nmol/L (ref 66.5–200.0)

## 2019-08-30 LAB — MAGNESIUM: Magnesium: 1.3 mg/dL — ABNORMAL LOW (ref 1.7–2.4)

## 2019-08-30 MED ORDER — FUROSEMIDE 10 MG/ML IJ SOLN
40.0000 mg | Freq: Once | INTRAMUSCULAR | Status: AC
  Administered 2019-08-30: 40 mg via INTRAVENOUS
  Filled 2019-08-30: qty 4

## 2019-08-30 MED ORDER — POTASSIUM CHLORIDE CRYS ER 20 MEQ PO TBCR
40.0000 meq | EXTENDED_RELEASE_TABLET | Freq: Once | ORAL | Status: AC
  Administered 2019-08-30: 40 meq via ORAL
  Filled 2019-08-30: qty 2

## 2019-08-30 MED ORDER — MAGNESIUM SULFATE 2 GM/50ML IV SOLN
2.0000 g | Freq: Once | INTRAVENOUS | Status: AC
  Administered 2019-08-30: 2 g via INTRAVENOUS
  Filled 2019-08-30: qty 50

## 2019-08-30 NOTE — Progress Notes (Signed)
PROGRESS NOTE    Billy Stone  EXN:[id number] DOB: 1950-09-16 DOA: 08/24/2019 PCP: Celene Squibb, MD   Brief Narrative:  69 y.o.malewith medical history significant ofmany years of chronic alcohol abuse, hepatitis, alcoholic cirrhosis, coronary artery disease, peptic ulcer disease, history of GI bleeding, GERD, BPH, chronic self-catheterization, multiple hospitalizations due to chronic alcohol abuse presents to the ED today complaining of weakness and difficulty ambulating. He was markedly ataxic.  1/1: Patient continues to do well this morning and has some worsening hyponatremia for which increase in sodium chloride has been ordered.  Currently awaiting SNF placement.  Patient has chronic Foley catheter.  Will give 1 dose of IV Lasix today given some congestion on chest x-ray noted 12/31.  1/2: Repeat dose of IV Lasix with good urine output noted overnight, approximately 1300.  Stable renal panel which will be repeated in a.m.  Low magnesium which will be supplemented.  Continue awaiting SNF placement for now.  Assessment & Plan:   Principal Problem:   Ataxia Active Problems:   GLUCOSE INTOLERANCE   Alcohol abuse   Essential hypertension   CAD (coronary artery disease), native coronary artery   UTI (urinary tract infection)   Arrhythmia   Cirrhosis of liver (HCC)   Hypokalemia   Weakness generalized   Jaundice   Ascites   Hepatitis   Severe protein-calorie malnutrition (HCC)   Acute liver failure   Hyperbilirubinemia   Hyponatremia   Leukocytosis   Loss of weight   Dysphagia   Aortic stenosis   Self-catheterizes urinary bladder   Alcohol intoxication (HCC)   Thrombocytopenia (HCC)   History of GI bleed   Atrial fibrillation with RVR ([place])   Goals of care, counseling/discussion   Palliative care by specialist   DNR (do not resuscitate) discussion   Terminal illness   Hypotension - soft BPs   Elevated brain natriuretic peptide (BNP) level   Hypoalbuminemia  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 Acute alcohol intoxication-TREATED AND RESOLVED. Patient was actively drinking alcohol on arrival and has been abusing alcohol for many yearsand has chronic liver disease. He was treated initially with gentle IV fluid hydration and withdrawal precautions started. He has been supplemented with vitamins and folic acid and thiamine via banana bag. Consult Education officer, [facility] for alcohol treatment program. 2. Warnicke's encephalopathywith ataxia with progressive weakness and falls at home. He was treated withhigh-dose thiamine and his mentation has rapidly improved. MRI brain completed 12/28 with no acute findings. CT brain did not show any acute findings. PT evaluation recommending SNf placement with placement pending. 3. Acute diastolic CHF exacerbation.  Responded well to 1 dose of IV Lasix yesterday and will repeat today.  Continue monitor renal panel. 4. Metabolic encephalopathy -Resolving with treatments,this has been intermittent, his prognosis remains guarded to poor as he develops multiorgan failure, I have asked palliative care team to assist with goals of care discussions.  5. E coliUTI-(pansensitive)he does self catheterizechronically andreportedsymptoms of burning at the urethra and was treated withIV antibiotic therapy.  6. Generalized weakness-multifactorial given his chronic comorbidities and poor self-care. PT evaluation requested. 7. Hyponatremia-continue current frequency of 3 times daily with sodium tablets and monitor again in a.m. 8. Hypomagnesemia-repleted IV again today and recheck in a.m. 9. Alcoholic cirrhosis-plan  to resume home oral diureticswhen ok with cardiology. BPs have been soft. 10. Acute rib fractures  - He has acute right rib fracture 5-8 likely contributing to his pain and suffering. Working with family to ask them to consider a comfort care approach to ease suffering but they are not receptive.  11. Atypical pneumonia - seen on CXR,  treatment with levofloxacin completed after 5 days. 12. 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 No active bleeding but not a candidate for full anticoagulation.  13. Urinary incontinence-patient self catheterizes and has been doing this for years which we will allow him to continue while inpatient with the assistance of the RN. 14. ParoxysmalAtrial fibrillation with RVR -now back in SR,DC'd imdur to give more room for titrating beta blocker, unsafe to start full anticoagulation now with chronic liver disease and severe low platelets. Cardiology has consulted and started on digoxin. 15. Severe protein calorie malnutrition-we will ask for dietitian consult. 16. Leukocytosis-likely secondary to urinary tract infection. WBC trendeddown. 17. Acute respiratory distress -resolved now,continue palliative care discussions.  18. GERD-Protonix ordered for GI protection. 19. History of upper GI bleeding-protonix ordered as above. 20. Chronic alcoholism-the patient seems to be nearing the end stages of chronic alcohol abuse and liver cirrhosis. His mortality is very high and I requesting a palliative medicine consultation for goals of care discussions.He desires to return home to continue drinking.   DVT prophylaxis:SCDs Code Status: Full Family Communication: Will plan to update brother Disposition Plan: Continue monitoring while awaiting SNF placement.   Consultants:   Palliative care  Procedures:   None  Antimicrobials:  Anti-infectives (From admission, onward)   Start     Dose/Rate Route Frequency Ordered Stop   08/26/19 1800  levofloxacin (LEVAQUIN) IVPB 750 mg     750  mg 100 mL/hr over 90 Minutes Intravenous Every 24 hours 08/26/19 1116 08/27/19 2129   08/24/19 1730  levofloxacin (LEVAQUIN) IVPB 750 mg     750 mg 100 mL/hr over 90 Minutes Intravenous Every 24 hours 08/24/19 1725 08/25/19 2006   08/24/19 1345  cefTRIAXone (ROCEPHIN) 1 g in sodium chloride 0.9 % 100 mL IVPB     1 g 200 mL/hr over 30 Minutes Intravenous  Once 08/24/19 1341 08/24/19 1704       Subjective: Patient seen and evaluated today with no new acute complaints or concerns. No acute concerns or events noted overnight.  He is overall doing well.  Objective: Vitals:   08/29/19 0811 08/29/19 1433 08/29/19 2200 08/30/19 0609  BP:  122/81  123/67  Pulse:  72 71 64  Resp:  20 20 18   Temp:  98.3 F (36.8 C)  98.6 F (37 C)  TempSrc:  Oral  Oral  SpO2: 98% 94% 96% 100%  Weight:      Height:        Intake/Output Summary (Last 24 hours) at 08/30/2019 1144 Last data filed at 08/29/2019 1658 Gross per 24 hour  Intake --  Output 1100 ml  Net -1100 ml   Filed Weights   08/27/19 0500 08/28/19 0500 08/29/19 0514  Weight: 69.8 kg 74 kg 72.2 kg    Examination:  General exam: Appears calm and comfortable  Respiratory system: Clear to auscultation. Respiratory effort normal.  Currently on room air. Cardiovascular system: S1 & S2 heard, RRR. No JVD, murmurs, rubs, gallops or clicks. No pedal edema. Gastrointestinal system: Abdomen is nondistended, soft and nontender. No organomegaly or masses felt. Normal bowel  sounds heard. Central nervous system: Alert and oriented. No focal neurological deficits. Extremities: Symmetric 5 x 5 power. Skin: No rashes, lesions or ulcers Psychiatry: Judgement and insight appear normal. Mood & affect appropriate.     Data Reviewed: I have personally reviewed following labs and imaging studies  CBC: Recent Labs  Lab 08/25/19 0429 08/26/19 0458 08/27/19 0640 08/28/19 0557 08/29/19 0528 08/30/19 0607  WBC 20.8* 12.7* 11.0* 8.6 8.5 10.0   NEUTROABS 17.1* 10.6* 7.7 4.9 4.1  --   HGB 9.8* 10.7* 9.8* 9.5* 10.3* 10.7*  HCT 29.9* 33.0* 29.9* 28.5* 31.3* 31.9*  MCV 111.2* 113.0* 111.6* 110.5* 111.8* 108.5*  PLT 59* 67* 84* 73* 82* 99*   Basic Metabolic Panel: Recent Labs  Lab 08/24/19 1106 08/26/19 0458 08/27/19 0640 08/28/19 0557 08/29/19 0528 08/30/19 0607  NA 132* 133* 136 130* 128* 132*  K 4.0 4.2 3.8 3.8 3.8 3.6  CL 96* 99 100 97* 99 94*  CO2 24 23 25 22 23 28   GLUCOSE 101* 111* 107* 99 116* 109*  BUN 10 14 16 21 18 11   CREATININE 0.79 0.72 0.75 0.92 0.81 0.61  CALCIUM 8.1* 8.4* 8.4* 8.4* 8.0* 8.0*  MG 1.2* 1.8 1.5* 2.0 1.7 1.3*  PHOS 3.3  --   --   --   --   --    GFR: Estimated Creatinine Clearance: 74.7 mL/min (by C-G formula based on SCr of 0.61 mg/dL). Liver Function Tests: Recent Labs  Lab 08/25/19 0429 08/26/19 0458 08/27/19 0640 08/28/19 0557 08/29/19 0528  AST 52* 93* 72* 51* 42*  ALT 15 18 18 16 14   ALKPHOS 77 76 58 60 67  BILITOT 3.9* 2.5* 2.0* 2.0* 1.8*  PROT 7.2 7.5 6.7 6.4* 6.2*  ALBUMIN 2.4* 2.5* 2.1* 2.0* 1.9*   Recent Labs  Lab 08/24/19 1106  LIPASE 31   Recent Labs  Lab 08/24/19 1201  AMMONIA 39*   Coagulation Profile: Recent Labs  Lab 08/25/19 0429 08/26/19 0458 08/27/19 0640 08/28/19 0557 08/29/19 0528  INR 1.4* 1.5* 1.6* 1.4* 1.6*   Cardiac Enzymes: No results for input(s): CKTOTAL, CKMB, CKMBINDEX, TROPONINI in the last 168 hours. BNP (last 3 results) No results for input(s): PROBNP in the last 8760 hours. HbA1C: No results for input(s): HGBA1C in the last 72 hours. CBG: Recent Labs  Lab 08/24/19 1113  GLUCAP 94   Lipid Profile: No results for input(s): CHOL, HDL, LDLCALC, TRIG, CHOLHDL, LDLDIRECT in the last 72 hours. Thyroid Function Tests: No results for input(s): TSH, T4TOTAL, FREET4, T3FREE, THYROIDAB in the last 72 hours. Anemia Panel: No results for input(s): VITAMINB12, FOLATE, FERRITIN, TIBC, IRON, RETICCTPCT in the last 72 hours. Sepsis  Labs: No results for input(s): PROCALCITON, LATICACIDVEN in the last 168 hours.  Recent Results (from the past 240 hour(s))  Urine culture     Status: Abnormal   Collection Time: 08/24/19 11:35 AM   Specimen: Urine, Clean Catch  Result Value Ref Range Status   Specimen Description   Final    URINE, CLEAN CATCH Performed at [facility] [address] [place], [facility] [address]    Special Requests   Final    NONE Performed at [facility], [address], [place], [facility] [address]    Culture >=100,000 COLONIES/mL ESCHERICHIA COLI (A)  Final   Report Status 08/27/2019 FINAL  Final   Organism ID, Bacteria ESCHERICHIA COLI (A)  Final      Susceptibility   Escherichia coli - MIC*    AMPICILLIN 4  SENSITIVE Sensitive     CEFAZOLIN <=4 SENSITIVE Sensitive     CEFTRIAXONE <=0.25 SENSITIVE Sensitive     CIPROFLOXACIN <=0.25 SENSITIVE Sensitive     GENTAMICIN <=1 SENSITIVE Sensitive     IMIPENEM <=0.25 SENSITIVE Sensitive     NITROFURANTOIN <=16 SENSITIVE Sensitive     TRIMETH/SULFA <=20 SENSITIVE Sensitive     AMPICILLIN/SULBACTAM <=2 SENSITIVE Sensitive     PIP/TAZO <=4 SENSITIVE Sensitive     * >=100,000 COLONIES/mL ESCHERICHIA COLI  SARS CORONAVIRUS 2 (TAT 6-24 HRS) Nasopharyngeal Nasopharyngeal Swab     Status: None   Collection Time: 08/24/19  8:36 PM   Specimen: Nasopharyngeal Swab  Result Value Ref Range Status   SARS Coronavirus 2 NEGATIVE NEGATIVE Final    Comment: (NOTE) SARS-CoV-2 target nucleic acids are NOT DETECTED. The SARS-CoV-2 RNA is generally detectable in upper and lower respiratory specimens during the acute phase of infection. Negative results do not preclude SARS-CoV-2 infection, do not rule out co-infections with other pathogens, and should not be used as the sole basis for treatment or other patient management decisions. Negative results must be combined with clinical observations, patient history, and epidemiological information. The expected result is  Negative. Fact Sheet for Patients: HairSlick.no Fact Sheet for Healthcare Providers: quierodirigir.com This test is not yet approved or cleared by the [place] FDA and  has been authorized for detection and/or diagnosis of SARS-CoV-2 by FDA under an Emergency Use Authorization (EUA). This EUA will remain  in effect (meaning this test can be used) for the duration of the COVID-19 declaration under Section 56 4(b)(1) of the Act, 21 U.S.C. section 360bbb-3(b)(1), unless the authorization is terminated or revoked sooner. Performed at [facility], [address] [place], [place] [address]          Radiology Studies: No results found.      Scheduled Meds: . bisacodyl  10 mg Rectal Once  . Chlorhexidine Gluconate Cloth  6 each Topical Daily  . digoxin  0.125 mg Oral Daily  . folic acid  1 mg Oral Daily  . furosemide  40 mg Intravenous Once  . mouth rinse  15 mL Mouth Rinse BID  . metoprolol tartrate  25 mg Oral BID  . multivitamin with minerals  1 tablet Oral Daily  . pantoprazole  40 mg Oral Daily  . potassium chloride  40 mEq Oral Once  . sodium chloride  1 g Oral TID WC  . thiamine  100 mg Oral Daily   Or  . thiamine  100 mg Intravenous Daily   Continuous Infusions: . sodium chloride 10 mL/hr at 08/25/19 1526  . magnesium sulfate bolus IVPB       LOS: 6 days    Time spent: 30 minutes    Belford Pascucci Hoover Brunette, DO [facility] Pager [phone]  If 7PM-7AM, please contact night-coverage [email or web address] Password [facility] 08/30/2019, 11:44 AM

## 2019-08-31 LAB — BASIC METABOLIC PANEL
Anion gap: 10 (ref 5–15)
BUN: 10 mg/dL (ref 8–23)
CO2: 28 mmol/L (ref 22–32)
Calcium: 8.1 mg/dL — ABNORMAL LOW (ref 8.9–10.3)
Chloride: 93 mmol/L — ABNORMAL LOW (ref 98–111)
Creatinine, Ser: 0.66 mg/dL (ref 0.61–1.24)
GFR calc Af Amer: >60 mL/min (ref >60)
GFR calc non Af Amer: >60 mL/min (ref >60)
Glucose, Bld: 150 mg/dL — ABNORMAL HIGH (ref 70–99)
Potassium: 3.7 mmol/L (ref 3.5–5.1)
Sodium: 131 mmol/L — ABNORMAL LOW (ref 135–145)

## 2019-08-31 LAB — MAGNESIUM: Magnesium: 1.7 mg/dL (ref 1.7–2.4)

## 2019-08-31 NOTE — Progress Notes (Signed)
PROGRESS NOTE    Billy Stone  ACZ:[id number] DOB: [birth year] DOA: 08/24/2019 PCP: Celene Squibb, MD   Brief Narrative:  69 y.o.malewith medical history significant ofmany years of chronic alcohol abuse, hepatitis, alcoholic cirrhosis, coronary artery disease, peptic ulcer disease, history of GI bleeding, GERD, BPH, chronic self-catheterization, multiple hospitalizations due to chronic alcohol abuse presents to the ED today complaining of weakness and difficulty ambulating. He was markedly ataxic.  1/1:Patient continues to do well this morning and has some worsening hyponatremia for which increase in sodium chloride has been ordered. Currently awaiting SNF placement.Patient has chronic Foley catheter.Will give 1 dose of IV Lasix today given some congestion on chest x-ray noted 12/31.  1/2: Repeat dose of IV Lasix with good urine output noted overnight, approximately 1300.  Stable renal panel which will be repeated in a.m.  Low magnesium which will be supplemented.  Continue awaiting SNF placement for now.  1/3: Continues to do well with no acute complaints or concerns noted.  Awaiting placement to [facility] SNF hopefully by a.m.  Assessment & Plan:   Principal Problem:   Ataxia Active Problems:   GLUCOSE INTOLERANCE   Alcohol abuse   Essential hypertension   CAD (coronary artery disease), native coronary artery   UTI (urinary tract infection)   Arrhythmia   Cirrhosis of liver (HCC)   Hypokalemia   Weakness generalized   Jaundice   Ascites   Hepatitis   Severe protein-calorie malnutrition (HCC)   Acute liver failure   Hyperbilirubinemia   Hyponatremia   Leukocytosis   Loss of weight   Dysphagia   Aortic stenosis   Self-catheterizes urinary bladder   Alcohol intoxication (HCC)   Thrombocytopenia (HCC)   History of GI bleed   Atrial fibrillation with RVR ([place])   Goals of care, counseling/discussion   Palliative care by specialist   DNR (do not resuscitate)  discussion   Terminal illness   Hypotension - soft BPs   Elevated brain natriuretic peptide (BNP) level   Hypoalbuminemia   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 Acute alcohol intoxication-TREATED AND RESOLVED. Patient was actively drinking alcohol on arrival and has been abusing alcohol for many yearsand has chronic liver disease. He was treated initially with gentle IV fluid hydration and withdrawal precautions started. He has been supplemented with vitamins and folic acid and thiamine via banana bag. Consult [employer] officer, [facility] for alcohol treatment program. 2. Warnicke's encephalopathywith ataxia with progressive weakness and falls at home. He was treated withhigh-dose thiamine and his mentation has rapidly improved. MRI brain completed 12/28 with no acute findings. CT brain did not show any acute findings. PT evaluation recommending SNf placementwith placement pending. 3. Acute diastolic CHF exacerbation.  Responded well to 1 dose of IV Lasix yesterday with no further need for Lasix at this point. 4. Metabolic encephalopathy -Resolving with treatments,this has been intermittent, his prognosis remains guarded to poor as he develops multiorgan failure, I have asked palliative care team to assist with goals of care discussions.  5. E coliUTI-(pansensitive)he does self catheterizechronically andreportedsymptoms of burning at the urethra and was treated withIV antibiotic therapy.  6. Generalized weakness-multifactorial given his chronic comorbidities and poor self-care. PT evaluation requested. 7. Hyponatremia-continue current frequency of 3 times daily with sodium  tablets and monitor again in a.m. appears to be a chronic issue. 8. Hypomagnesemia-repleted.  Will  check again in a.m. 9. Alcoholic cirrhosis-plan to resume home oral diureticswhen ok with cardiology. BPs have been soft. 10. Acute rib fractures - He has acute right rib fracture 5-8 likely contributing to his pain and suffering. Working with family to ask them to consider a comfort care approach to ease suffering but they are not receptive.  11. Atypical pneumonia - seen on CXR, treatment with levofloxacin completed after 5 days. 12. 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 No active bleeding but not a candidate for full anticoagulation.  13. Urinary incontinence-patient self catheterizes and has been doing this for years which we will allow him to continue on Foley while inpatient. 14. ParoxysmalAtrial fibrillation with RVR -now back in SR,DC'd imdur to give more room for titrating beta blocker, unsafe to start full anticoagulation now with chronic liver disease and severe low platelets. Cardiology has consulted and started on digoxin. 15. Severe protein calorie malnutrition-we will ask for dietitian consult. 16. Leukocytosis-likely secondary to urinary tract infection. WBC trendeddown. 17. Acute respiratory distress -resolved now,continue palliative care discussions.  18. GERD-Protonix ordered for GI protection. 19. History of upper GI bleeding-protonix ordered as above. 20. Chronic alcoholism-the patient seems to be nearing the end stages of chronic alcohol abuse and liver cirrhosis. His mortality is very high and I requesting a palliative medicine consultation for goals of care discussions.He desires to return home to continue drinking.   DVT prophylaxis:SCDs Code Status:Full Family Communication:Will plan to update brother Disposition Plan:Continue monitoring while awaiting SNF placement; hopefully can discharge to [facility] in a.m.   Consultants:  Palliative care  Procedures:  None   Antimicrobials:  Anti-infectives (From admission, onward)   Start     Dose/Rate Route Frequency Ordered Stop   08/26/19 1800  levofloxacin (LEVAQUIN) IVPB 750 mg     750 mg 100 mL/hr over 90 Minutes Intravenous Every 24 hours 08/26/19 1116 08/27/19 2129   08/24/19 1730  levofloxacin (LEVAQUIN) IVPB 750 mg     750 mg 100 mL/hr over 90 Minutes Intravenous Every 24 hours 08/24/19 1725 08/25/19 2006   08/24/19 1345  cefTRIAXone (ROCEPHIN) 1 g in sodium chloride 0.9 % 100 mL IVPB     1 g 200 mL/hr over 30 Minutes Intravenous  Once 08/24/19 1341 08/24/19 1704       Subjective: Patient seen and evaluated today with no new acute complaints or concerns. No acute concerns or events noted overnight.  Objective: Vitals:   08/30/19 1329 08/30/19 2220 08/31/19 0433 08/31/19 0500  BP: 103/65 114/64 (!) 118/58   Pulse: 67 78 75   Resp: 18 18 18    Temp: 98.1 F (36.7 C) 97.6 F (36.4 C) 98.1 F (36.7 C)   TempSrc:      SpO2: 96% 95% 97%   Weight:    66.7 kg  Height:        Intake/Output Summary (Last 24 hours) at 08/31/2019 1109 Last data filed at 08/31/2019 0700 Gross per 24 hour  Intake 3318.67 ml  Output 650 ml  Net 2668.67 ml   Filed Weights   08/28/19 0500 08/29/19 0514 08/31/19 0500  Weight: 74 kg 72.2 kg 66.7 kg    Examination:  General exam: Appears calm and comfortable  Respiratory system: Clear to auscultation. Respiratory effort normal.  Currently on room air. Cardiovascular system: S1 & S2 heard, RRR. No JVD, murmurs,  rubs, gallops or clicks. No pedal edema. Gastrointestinal system: Abdomen is nondistended, soft and nontender. No organomegaly or masses felt. Normal bowel sounds heard. Central nervous system: Alert and oriented. No focal neurological deficits. Extremities: Symmetric 5 x 5 power. Skin: No rashes, lesions or ulcers Psychiatry: Judgement and insight appear normal. Mood & affect appropriate.     Data Reviewed: I have personally reviewed following labs  and imaging studies  CBC: Recent Labs  Lab 08/25/19 0429 08/26/19 0458 08/27/19 0640 08/28/19 0557 08/29/19 0528 08/30/19 0607  WBC 20.8* 12.7* 11.0* 8.6 8.5 10.0  NEUTROABS 17.1* 10.6* 7.7 4.9 4.1  --   HGB 9.8* 10.7* 9.8* 9.5* 10.3* 10.7*  HCT 29.9* 33.0* 29.9* 28.5* 31.3* 31.9*  MCV 111.2* 113.0* 111.6* 110.5* 111.8* 108.5*  PLT 59* 67* 84* 73* 82* 99*   Basic Metabolic Panel: Recent Labs  Lab 08/27/19 0640 08/28/19 0557 08/29/19 0528 08/30/19 0607 08/31/19 0617  NA 136 130* 128* 132* 131*  K 3.8 3.8 3.8 3.6 3.7  CL 100 97* 99 94* 93*  CO2 25 22 23 28 28   GLUCOSE 107* 99 116* 109* 150*  BUN 16 21 18 11 10   CREATININE 0.75 0.92 0.81 0.61 0.66  CALCIUM 8.4* 8.4* 8.0* 8.0* 8.1*  MG 1.5* 2.0 1.7 1.3* 1.7   GFR: Estimated Creatinine Clearance: 74.7 mL/min (by C-G formula based on SCr of 0.66 mg/dL). Liver Function Tests: Recent Labs  Lab 08/25/19 0429 08/26/19 0458 08/27/19 0640 08/28/19 0557 08/29/19 0528  AST 52* 93* 72* 51* 42*  ALT 15 18 18 16 14   ALKPHOS 77 76 58 60 67  BILITOT 3.9* 2.5* 2.0* 2.0* 1.8*  PROT 7.2 7.5 6.7 6.4* 6.2*  ALBUMIN 2.4* 2.5* 2.1* 2.0* 1.9*   No results for input(s): LIPASE, AMYLASE in the last 168 hours. Recent Labs  Lab 08/24/19 1201  AMMONIA 39*   Coagulation Profile: Recent Labs  Lab 08/25/19 0429 08/26/19 0458 08/27/19 0640 08/28/19 0557 08/29/19 0528  INR 1.4* 1.5* 1.6* 1.4* 1.6*   Cardiac Enzymes: No results for input(s): CKTOTAL, CKMB, CKMBINDEX, TROPONINI in the last 168 hours. BNP (last 3 results) No results for input(s): PROBNP in the last 8760 hours. HbA1C: No results for input(s): HGBA1C in the last 72 hours. CBG: Recent Labs  Lab 08/24/19 1113  GLUCAP 94   Lipid Profile: No results for input(s): CHOL, HDL, LDLCALC, TRIG, CHOLHDL, LDLDIRECT in the last 72 hours. Thyroid Function Tests: No results for input(s): TSH, T4TOTAL, FREET4, T3FREE, THYROIDAB in the last 72 hours. Anemia Panel: No  results for input(s): VITAMINB12, FOLATE, FERRITIN, TIBC, IRON, RETICCTPCT in the last 72 hours. Sepsis Labs: No results for input(s): PROCALCITON, LATICACIDVEN in the last 168 hours.  Recent Results (from the past 240 hour(s))  Urine culture     Status: Abnormal   Collection Time: 08/24/19 11:35 AM   Specimen: Urine, Clean Catch  Result Value Ref Range Status   Specimen Description   Final    URINE, CLEAN CATCH Performed at [facility], [address] [place], [facility] [address]    Special Requests   Final    NONE Performed at [facility], [address] [place], [facility] [address]    Culture >=100,000 COLONIES/mL ESCHERICHIA COLI (A)  Final   Report Status 08/27/2019 FINAL  Final   Organism ID, Bacteria ESCHERICHIA COLI (A)  Final      Susceptibility   Escherichia coli - MIC*    AMPICILLIN 4 SENSITIVE Sensitive     CEFAZOLIN <=  4 SENSITIVE Sensitive     CEFTRIAXONE <=0.25 SENSITIVE Sensitive     CIPROFLOXACIN <=0.25 SENSITIVE Sensitive     GENTAMICIN <=1 SENSITIVE Sensitive     IMIPENEM <=0.25 SENSITIVE Sensitive     NITROFURANTOIN <=16 SENSITIVE Sensitive     TRIMETH/SULFA <=20 SENSITIVE Sensitive     AMPICILLIN/SULBACTAM <=2 SENSITIVE Sensitive     PIP/TAZO <=4 SENSITIVE Sensitive     * >=100,000 COLONIES/mL ESCHERICHIA COLI  SARS CORONAVIRUS 2 (TAT 6-24 HRS) Nasopharyngeal Nasopharyngeal Swab     Status: None   Collection Time: 08/24/19  8:36 PM   Specimen: Nasopharyngeal Swab  Result Value Ref Range Status   SARS Coronavirus 2 NEGATIVE NEGATIVE Final    Comment: (NOTE) SARS-CoV-2 target nucleic acids are NOT DETECTED. The SARS-CoV-2 RNA is generally detectable in upper and lower respiratory specimens during the acute phase of infection. Negative results do not preclude SARS-CoV-2 infection, do not rule out co-infections with other pathogens, and should not be used as the sole basis for treatment or other patient management decisions. Negative results must be combined  with clinical observations, patient history, and epidemiological information. The expected result is Negative. Fact Sheet for Patients: HairSlick.no Fact Sheet for Healthcare Providers: quierodirigir.com This test is not yet approved or cleared by the [place] FDA and  has been authorized for detection and/or diagnosis of SARS-CoV-2 by FDA under an Emergency Use Authorization (EUA). This EUA will remain  in effect (meaning this test can be used) for the duration of the COVID-19 declaration under Section 56 4(b)(1) of the Act, 21 U.S.C. section 360bbb-3(b)(1), unless the authorization is terminated or revoked sooner. Performed at [facility], [address], [place], [place] [address]          Radiology Studies: No results found.      Scheduled Meds: . bisacodyl  10 mg Rectal Once  . Chlorhexidine Gluconate Cloth  6 each Topical Daily  . digoxin  0.125 mg Oral Daily  . folic acid  1 mg Oral Daily  . mouth rinse  15 mL Mouth Rinse BID  . metoprolol tartrate  25 mg Oral BID  . multivitamin with minerals  1 tablet Oral Daily  . pantoprazole  40 mg Oral Daily  . sodium chloride  1 g Oral TID WC  . thiamine  100 mg Oral Daily   Or  . thiamine  100 mg Intravenous Daily   Continuous Infusions: . sodium chloride 10 mL/hr at 08/30/19 1408     LOS: 7 days    Time spent: 30 minutes    Nastacia Raybuck Hoover Brunette, DO [facility] Pager [phone]  If 7PM-7AM, please contact night-coverage [email or web address] Password [email or web address] 08/31/2019, 11:09 AM

## 2019-09-01 DIAGNOSIS — F1020 Alcohol dependence, uncomplicated: Secondary | ICD-10-CM

## 2019-09-01 LAB — BASIC METABOLIC PANEL
Anion gap: 11 (ref 5–15)
BUN: 8 mg/dL (ref 8–23)
CO2: 25 mmol/L (ref 22–32)
Calcium: 7.7 mg/dL — ABNORMAL LOW (ref 8.9–10.3)
Chloride: 95 mmol/L — ABNORMAL LOW (ref 98–111)
Creatinine, Ser: 0.51 mg/dL — ABNORMAL LOW (ref 0.61–1.24)
GFR calc Af Amer: >60 mL/min (ref >60)
GFR calc non Af Amer: >60 mL/min (ref >60)
Glucose, Bld: 119 mg/dL — ABNORMAL HIGH (ref 70–99)
Potassium: 4.1 mmol/L (ref 3.5–5.1)
Sodium: 131 mmol/L — ABNORMAL LOW (ref 135–145)

## 2019-09-01 LAB — MAGNESIUM: Magnesium: 1.9 mg/dL (ref 1.7–2.4)

## 2019-09-01 MED ORDER — SODIUM CHLORIDE 1 G PO TABS: 1.0000 g | ORAL_TABLET | Freq: Three times a day (TID) | ORAL | 0 refills | Status: AC

## 2019-09-01 MED ORDER — OXYCODONE HCL 5 MG PO TABS: 2.5000 mg | ORAL_TABLET | ORAL | 0 refills | Status: DC | PRN

## 2019-09-01 MED ORDER — DIGOXIN 125 MCG PO TABS: 0.1250 mg | ORAL_TABLET | Freq: Every day | ORAL | 3 refills | Status: DC

## 2019-09-01 MED ORDER — METOPROLOL TARTRATE 25 MG PO TABS: 25.0000 mg | ORAL_TABLET | Freq: Two times a day (BID) | ORAL | 0 refills | Status: DC

## 2019-09-01 MED ORDER — THIAMINE HCL 100 MG PO TABS: 100.0000 mg | ORAL_TABLET | Freq: Every day | ORAL | 0 refills | Status: AC

## 2019-09-01 NOTE — Plan of Care (Signed)
Discussed plan of care with patient in front of family, pain management and hygiene with some teach back displayed at this time.  Patient agreed to bath later.

## 2019-09-01 NOTE — Progress Notes (Signed)
Report called to [place], LPN at [facility]. Awaiting EMS for transport.

## 2019-09-01 NOTE — Discharge Summary (Signed)
Physician Discharge Summary  Billy Stone:[id number] DOB: January 26, 1951 DOA: 08/24/2019  PCP: Celene Squibb, MD  Admit date: 08/24/2019  Discharge date: 09/01/2019  Admitted From:Home  Disposition:  SNF  Recommendations for Outpatient Follow-up:  1. Follow up with PCP in 1-2 weeks 2. Follow-up with cardiology outpatient as scheduled 3. Continue on medications as noted below  Home Health: None  Equipment/Devices: None  Discharge Condition: Stable  CODE STATUS: Full  Diet recommendation: Heart Healthy  Brief/Interim Summary: 69 y.o.malewith medical history significant ofmany years of chronic alcohol abuse, hepatitis, alcoholic cirrhosis, coronary artery disease, peptic ulcer disease, history of GI bleeding, GERD, BPH, chronic self-catheterization, multiple hospitalizations due to chronic alcohol abuse presents to the ED today complaining of weakness and difficulty ambulating. He was markedly ataxic.  1/1:Patient continues to do well this morning and has some worsening hyponatremia for which increase in sodium chloride has been ordered. Currently awaiting SNF placement.Patient has chronic Foley catheter.Will give 1 dose of IV Lasix today given some congestion on chest x-ray noted 12/31.  1/2:Repeat dose of IV Lasix with good urine output noted overnight, approximately 1300. Stable renal panel which will be repeated in a.m. Low magnesium which will be supplemented. Continue awaiting SNF placement for now.  1/3: Continues to do well with no acute complaints or concerns noted.  Awaiting placement to [facility] SNF hopefully by a.m.  1/4: Patient has had acute alcohol intoxication which has been treated and resolved.  He was also noted to have Warnicke's encephalopathy with ataxia and progressive weakness with falls at home requiring placement to SNF.  He has no further CHF exacerbation at this point and metabolic encephalopathy has resolved.  He is also completed  treatment for E. coli UTI with IV antibiotics.  He continues to have chronic hyponatremia for which sodium tablets have been ordered to continue.  He was noted to have some new onset atrial fibrillation with RVR and has been started on digoxin by cardiology with no recommendations for anticoagulation at this time given his chronic alcohol abuse, history of GI bleed, peptic ulcer disease.  He is otherwise doing well and stable for discharge.  Discharge Diagnoses:  Principal Problem:   Ataxia Active Problems:   GLUCOSE INTOLERANCE   Alcohol abuse   Essential hypertension   CAD (coronary artery disease), native coronary artery   UTI (urinary tract infection)   Arrhythmia   Cirrhosis of liver (HCC)   Hypokalemia   Weakness generalized   Jaundice   Ascites   Hepatitis   Severe protein-calorie malnutrition (HCC)   Acute liver failure   Hyperbilirubinemia   Hyponatremia   Leukocytosis   Loss of weight   Dysphagia   Aortic stenosis   Self-catheterizes urinary bladder   Alcohol intoxication (HCC)   Thrombocytopenia (HCC)   History of GI bleed   Atrial fibrillation with RVR ([place])   Goals of care, counseling/discussion   Palliative care by specialist   DNR (do not resuscitate) discussion   Terminal illness   Hypotension - soft BPs   Elevated brain natriuretic peptide (BNP) level   Hypoalbuminemia   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  Discharge Instructions  Discharge Instructions    Diet - low sodium heart healthy   Complete by:  As directed    Increase activity slowly   Complete by: As directed      Allergies as of 09/01/2019      Reactions   Amlodipine Besylate    REACTION: Feet swell   Penicillins Other (See Comments)   Childhood allergy Has patient had a PCN reaction causing immediate rash, facial/tongue/throat swelling, SOB or lightheadedness with hypotension: Unknown Has patient had a PCN reaction causing severe rash involving mucus membranes or skin necrosis: Unknown Has patient had a PCN reaction that required hospitalization: Unknown Has patient had a PCN reaction occurring within the last 10 years: No If all of the above answers are "NO", then may proceed with Cephalosporin use.      Medication List    STOP taking these medications   isosorbide mononitrate 30 MG 24 hr tablet Commonly known as: IMDUR   spironolactone 50 MG tablet Commonly known as: ALDACTONE     TAKE these medications   digoxin 0.125 MG tablet Commonly known as: LANOXIN Take 1 tablet (0.125 mg total) by mouth daily. Start taking on: September 02, 2019   diphenhydrAMINE 25 mg capsule Commonly known as: BENADRYL Take 25 mg by mouth daily as needed for allergies.   folic acid 1 MG tablet Commonly known as: FOLVITE Take 1 tablet (1 mg total) by mouth daily.   furosemide 20 MG tablet Commonly known as: LASIX TAKE (1) TABLET BY MOUTH DAILY   metoprolol tartrate 25 MG tablet Commonly known as: LOPRESSOR Take 1 tablet (25 mg total) by mouth 2 (two) times daily. What changed:   medication strength  See the new instructions.   multivitamin with minerals Tabs tablet Take 1 tablet by mouth daily.   nitroGLYCERIN 0.4 MG SL tablet Commonly known as: NITROSTAT Place 1 tablet (0.4 mg total) under the tongue every 5 (five) minutes as needed for chest pain.   oxyCODONE 5 MG immediate release tablet Commonly known as: Oxy IR/ROXICODONE Take 0.5 tablets (2.5 mg total) by mouth every 4 (four) hours as needed for moderate pain  or severe pain (headache).   pantoprazole 40 MG tablet Commonly known as: PROTONIX TAKE (1) TABLET BY MOUTH DAILY   sodium chloride 1 g tablet Take 1 tablet (1 g total) by mouth 3 (three) times daily with meals.   thiamine 100 MG tablet Take 1 tablet (100 mg total) by mouth daily. Start taking on: September 02, 2019   vitamin B-12 100 MCG tablet Commonly known as: CYANOCOBALAMIN Take 100 mcg by mouth daily.       Contact information for follow-up providers    Benita Stabile, MD Follow up in 1 week(s).   Specialty: Internal Medicine Contact information: [address] [place] [facility] [address] [phone]        Jonelle Sidle, MD .   Specialty: Cardiology Contact information: [address] [place] [address] [phone]            Contact information for after-discharge care    Destination    [facility] .   Service: Skilled Nursing Contact information[address] [place] [address] [phone]                 Allergies  Allergen Reactions  . Amlodipine Besylate     REACTION: Feet  swell  . Penicillins Other (See Comments)    Childhood allergy Has patient had a PCN reaction causing immediate rash, facial/tongue/throat swelling, SOB or lightheadedness with hypotension: Unknown Has patient had a PCN reaction causing severe rash involving mucus membranes or skin necrosis: Unknown Has patient had a PCN reaction that required hospitalization: Unknown Has patient had a PCN reaction occurring within the last 10 years: No If all of the above answers are "NO", then may proceed with Cephalosporin use.     Consultations:  Cardiology  Palliative care   Procedures/Studies: DG Chest 1 View  Result Date: 08/25/2019 CLINICAL DATA:  Leukocytosis EXAM: PORTABLE CHEST 1 VIEW COMPARISON:  08/24/2019 FINDINGS: Stable mild interstitial prominence. No pleural effusion or pneumothorax. Stable cardiomediastinal contours.  IMPRESSION: No acute process in the chest. Electronically Signed   By: Guadlupe Spanish M.D.   On: 08/25/2019 08:42   CT Head Wo Contrast  Result Date: 08/24/2019 CLINICAL DATA:  69 year old male with weakness, gait disturbance and multiple falls over the past week EXAM: CT HEAD WITHOUT CONTRAST TECHNIQUE: Contiguous axial images were obtained from the base of the skull through the vertex without intravenous contrast. COMPARISON:  Prior head CT 04/23/2014 FINDINGS: Brain: No evidence of acute infarction, hemorrhage, hydrocephalus, extra-axial collection or mass lesion/mass effect. Stable mild cortical volume loss. Vascular: No hyperdense vessel or unexpected calcification. Skull: Normal. Negative for fracture or focal lesion. Sinuses/Orbits: No acute finding. Other: None. IMPRESSION: No acute intracranial abnormality. Electronically Signed   By: Malachy Moan M.D.   On: 08/24/2019 13:28   MR BRAIN WO CONTRAST  Result Date: 08/25/2019 CLINICAL DATA:  Ataxia and gait disturbance over the last 2 days. Suspected stroke. EXAM: MRI HEAD WITHOUT CONTRAST TECHNIQUE: Multiplanar, multiecho pulse sequences of the brain and surrounding structures were obtained without intravenous contrast. COMPARISON:  Head CT yesterday FINDINGS: Brain: Diffusion imaging does not show any acute or subacute infarction. Few old small vessel cerebellar infarctions. Cerebral hemispheres show age related volume loss with mild small vessel change of the white matter considering age. No cortical or large vessel territory infarction. No mass lesion, hemorrhage, hydrocephalus or extra-axial collection. Vascular: Major vessels at the base of the brain show flow. Skull and upper cervical spine: Negative Sinuses/Orbits: Clear except for minimal seasonal mucosal thickening. Orbits negative. Other: None IMPRESSION: No acute or reversible finding. Mild age related volume loss and chronic small-vessel change of the cerebral hemispheric white  matter. Few old small vessel cerebellar infarctions. Electronically Signed   By: Paulina Fusi M.D.   On: 08/25/2019 08:39   DG Chest Port 1 View  Result Date: 08/28/2019 CLINICAL DATA:  Shortness of breath and wheezing EXAM: PORTABLE CHEST 1 VIEW COMPARISON:  08/26/2019 FINDINGS: Cardiac shadow is stable. Aortic calcifications are seen. The lungs are well aerated bilaterally. Some slight increase in central vascular congestion is noted. Healed posterior right rib fractures are noted with new lateral right rib fracture stable from the prior study. No new bony abnormality is seen. IMPRESSION: Mild vascular congestion. Multiple right rib fractures laterally. Electronically Signed   By: Alcide Clever M.D.   On: 08/28/2019 07:15   DG CHEST PORT 1 VIEW  Result Date: 08/26/2019 CLINICAL DATA:  69 year old male with a history shortness of breath and wheezing EXAM: PORTABLE CHEST 1 VIEW COMPARISON:  08/25/2019, 08/24/2019, CT chest 05/06/2017 FINDINGS: Cardiomediastinal silhouette unchanged in size and contour. Low lung volumes with mild reticular opacities. No pneumothorax. No large pleural effusion. No new confluent airspace disease. The patient has  sequential posterior right rib fractures which are partially healed, in addition to new acute displaced rib fractures at the lateral right ribs of 5, 6, 7, 8. These acute rib fractures were present not on the prior CT but on the recent comparison chest x-rays. No new acute left-sided rib fractures identified. IMPRESSION: Low lung volumes with coarsened interstitial markings, potentially atelectasis or developing atypical infection. The patient has new acute displaced rib fractures at the lateral aspect of the right ribs 5-8. These were present on the most recent comparison chest x-ray studies and are new from the CT of 2018, and correlation with any recent chest trauma may be useful. 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  Electronically Signed   By: Gilmer Mor D.O.   On: 08/26/2019 12:47   DG Chest Port 1 View  Result Date: 08/24/2019 CLINICAL DATA:  69 year old presenting with generalized weakness. EXAM: PORTABLE CHEST 1 VIEW COMPARISON:  05/05/2017 and earlier. FINDINGS: Cardiac silhouette normal in size, unchanged. Lungs clear. Bronchovascular markings normal. Pulmonary vascularity normal. No visible pleural effusions. No pneumothorax. Remote RIGHT rib fractures with incomplete union again noted. No interval change. IMPRESSION: No acute cardiopulmonary disease.  Stable examination. Electronically Signed   By: Hulan Saas M.D.   On: 08/24/2019 13:29   ECHOCARDIOGRAM COMPLETE  Result Date: 08/26/2019   ECHOCARDIOGRAM REPORT   Patient Name:   Billy Stone Date of Exam: 08/26/2019 Medical Rec #:  [id number]       Height:       65.0 in Accession #:    [id number]      Weight:       155.0 lb Date of Birth:  02/11/1951       BSA:          1.78 m Patient Age:    68 years        BP:           114/60 mmHg Patient Gender: M               HR:           89 bpm. [facility] Procedure: 2D Echo, Cardiac Doppler and Color Doppler Indications:    Aortic Stenosis 424.1 / 135.0  History:        Patient has prior history of Echocardiogram examinations, most                 recent 08/20/2017. CAD, Arrythmias:Atrial Fibrillation; Risk                 Factors:Hypertension. Acute liver failure,Alcohol abuse,.  Sonographer:    Celesta Gentile RCS Referring Phys: [id number] Ellsworth Lennox  Sonographer Comments: Patient could NOT follow commands to "sniff". IMPRESSIONS  1. Left ventricular ejection fraction, by visual estimation, is 55 to 60%. The left ventricle has normal function. There is no left ventricular hypertrophy.  2. The left ventricle has no regional wall motion abnormalities.  3. Global right ventricle has normal systolic function.The right ventricular size is normal. No increase in right ventricular wall  thickness.  4. Left atrial size was normal.  5. Right atrial size was normal.  6. Moderate mitral annular calcification.  7. Moderate thickening of the mitral valve leaflet(s).  8. The mitral valve is normal in structure. Trivial mitral valve regurgitation. No evidence of mitral stenosis.  9. The tricuspid valve is normal in structure. 10. The aortic valve has an indeterminant number of cusps.  Aortic valve regurgitation is not visualized. Mild to moderate aortic valve stenosis. 11. The pulmonic valve was not well visualized. Pulmonic valve regurgitation is not visualized. 12. Mildly elevated pulmonary artery systolic pressure. FINDINGS  Left Ventricle: Left ventricular ejection fraction, by visual estimation, is 55 to 60%. The left ventricle has normal function. The left ventricle has no regional wall motion abnormalities. There is no left ventricular hypertrophy. Left ventricular diastolic parameters were normal. Right Ventricle: The right ventricular size is normal. No increase in right ventricular wall thickness. Global RV systolic function is has normal systolic function. The tricuspid regurgitant velocity is 2.56 m/s, and with an assumed right atrial pressure  of 10 mmHg, the estimated right ventricular systolic pressure is mildly elevated at 36.2 mmHg. Left Atrium: Left atrial size was normal in size. Right Atrium: Right atrial size was normal in size Pericardium: There is no evidence of pericardial effusion. Mitral Valve: The mitral valve is normal in structure. There is moderate thickening of the mitral valve leaflet(s). Moderate mitral annular calcification. Trivial mitral valve regurgitation. No evidence of mitral valve stenosis by observation. Tricuspid Valve: The tricuspid valve is normal in structure. Tricuspid valve regurgitation is mild. Aortic Valve: The aortic valve has an indeterminant number of cusps. . There is moderate thickening and moderate calcification of the aortic valve. Aortic valve  regurgitation is not visualized. Mild to moderate aortic stenosis is present. Moderate aortic  valve annular calcification. There is moderate thickening of the aortic valve. There is moderate calcification of the aortic valve. Aortic valve mean gradient measures 13.0 mmHg. Aortic valve peak gradient measures 24.4 mmHg. Aortic valve area, by VTI measures 1.06 cm. Pulmonic Valve: The pulmonic valve was not well visualized. Pulmonic valve regurgitation is not visualized. Pulmonic regurgitation is not visualized. No evidence of pulmonic stenosis. Aorta: The aortic root is normal in size and structure. Pulmonary Artery: There is moderate pulmonary hypertension, the PASP is 41 mmHg. IAS/Shunts: No atrial level shunt detected by color flow Doppler.  LEFT VENTRICLE PLAX 2D LVIDd:         3.77 cm       Diastology LVIDs:         2.68 cm       LV e' lateral:   11.20 cm/s LV PW:         0.93 cm       LV E/e' lateral: 8.0 LV IVS:        1.10 cm       LV e' medial:    8.05 cm/s LVOT diam:     1.80 cm       LV E/e' medial:  11.2 LV SV:         34 ml LV SV Index:   18.94 LVOT Area:     2.54 cm  LV Volumes (MOD) LV area d, A2C:    26.20 cm LV area d, A4C:    23.50 cm LV area s, A2C:    12.10 cm LV area s, A4C:    12.40 cm LV major d, A2C:   8.02 cm LV major d, A4C:   7.51 cm LV major s, A2C:   6.38 cm LV major s, A4C:   6.37 cm LV vol d, MOD A2C: 71.6 ml LV vol d, MOD A4C: 60.4 ml LV vol s, MOD A2C: 20.0 ml LV vol s, MOD A4C: 20.3 ml LV SV MOD A2C:     51.6 ml LV SV MOD A4C:     60.4 ml  LV SV MOD BP:      47.7 ml RIGHT VENTRICLE RV S prime:     13.40 cm/s TAPSE (M-mode): 1.8 cm LEFT ATRIUM             Index       RIGHT ATRIUM           Index LA diam:        2.90 cm 1.63 cm/m  RA Area:     14.60 cm LA Vol (A2C):   52.5 ml 29.58 ml/m RA Volume:   31.80 ml  17.92 ml/m LA Vol (A4C):   37.4 ml 21.07 ml/m LA Biplane Vol: 45.4 ml 25.58 ml/m  AORTIC VALVE AV Area (Vmax):    1.02 cm AV Area (Vmean):   1.04 cm AV Area (VTI):      1.06 cm AV Vmax:           247.00 cm/s AV Vmean:          168.000 cm/s AV VTI:            0.499 m AV Peak Grad:      24.4 mmHg AV Mean Grad:      13.0 mmHg LVOT Vmax:         99.40 cm/s LVOT Vmean:        68.400 cm/s LVOT VTI:          0.207 m LVOT/AV VTI ratio: 0.41  AORTA Ao Root diam: 3.50 cm MITRAL VALVE                        TRICUSPID VALVE MV Area (PHT): 3.27 cm             TR Peak grad:   26.2 mmHg MV PHT:        67.28 msec           TR Vmax:        256.00 cm/s MV Decel Time: 232 msec MV E velocity: 89.80 cm/s 103 cm/s  SHUNTS MV A velocity: 78.30 cm/s 70.3 cm/s Systemic VTI:  0.21 m MV E/A ratio:  1.15       1.5       Systemic Diam: 1.80 cm  Dina Rich MD Electronically signed by Dina Rich MD Signature Date/Time: 08/26/2019/4:44:12 PM    Final     Discharge Exam: Vitals:   09/01/19 0350 09/01/19 0909  BP: 107/60   Pulse: (!) 59 63  Resp: 14   Temp: 98.4 F (36.9 C)   SpO2: 100%    Vitals:   08/31/19 2328 09/01/19 0350 09/01/19 0402 09/01/19 0909  BP: 113/63 107/60    Pulse: 66 (!) 59  63  Resp: 19 14    Temp: 98.3 F (36.8 C) 98.4 F (36.9 C)    TempSrc: Oral Oral    SpO2: 97% 100%    Weight:   72.8 kg   Height:        General: Pt is alert, awake, not in acute distress Cardiovascular: RRR, S1/S2 +, no rubs, no gallops Respiratory: CTA bilaterally, no wheezing, no rhonchi Abdominal: Soft, NT, ND, bowel sounds + Extremities: no edema, no cyanosis    The results of significant diagnostics from this hospitalization (including imaging, microbiology, ancillary and laboratory) are listed below for reference.     Microbiology: Recent Results (from the past 240 hour(s))  Urine culture     Status: Abnormal   Collection Time: 08/24/19 11:35 AM   Specimen:  Urine, Clean Catch  Result Value Ref Range Status   Specimen Description   Final    URINE, CLEAN CATCH Performed at [facility], [address], [place], [place] [address]    Special Requests   Final     NONE Performed at [facility], [address] [place], [place] [address]    Culture >=100,000 COLONIES/mL ESCHERICHIA COLI (A)  Final   Report Status 08/27/2019 FINAL  Final   Organism ID, Bacteria ESCHERICHIA COLI (A)  Final      Susceptibility   Escherichia coli - MIC*    AMPICILLIN 4 SENSITIVE Sensitive     CEFAZOLIN <=4 SENSITIVE Sensitive     CEFTRIAXONE <=0.25 SENSITIVE Sensitive     CIPROFLOXACIN <=0.25 SENSITIVE Sensitive     GENTAMICIN <=1 SENSITIVE Sensitive     IMIPENEM <=0.25 SENSITIVE Sensitive     NITROFURANTOIN <=16 SENSITIVE Sensitive     TRIMETH/SULFA <=20 SENSITIVE Sensitive     AMPICILLIN/SULBACTAM <=2 SENSITIVE Sensitive     PIP/TAZO <=4 SENSITIVE Sensitive     * >=100,000 COLONIES/mL ESCHERICHIA COLI  SARS CORONAVIRUS 2 (TAT 6-24 HRS) Nasopharyngeal Nasopharyngeal Swab     Status: None   Collection Time: 08/24/19  8:36 PM   Specimen: Nasopharyngeal Swab  Result Value Ref Range Status   SARS Coronavirus 2 NEGATIVE NEGATIVE Final    Comment: (NOTE) SARS-CoV-2 target nucleic acids are NOT DETECTED. The SARS-CoV-2 RNA is generally detectable in upper and lower respiratory specimens during the acute phase of infection. Negative results do not preclude SARS-CoV-2 infection, do not rule out co-infections with other pathogens, and should not be used as the sole basis for treatment or other patient management decisions. Negative results must be combined with clinical observations, patient history, and epidemiological information. The expected result is Negative. Fact Sheet for Patients: HairSlick.no Fact Sheet for Healthcare Providers: quierodirigir.com This test is not yet approved or cleared by the [place] FDA and  has been authorized for detection and/or diagnosis of SARS-CoV-2 by FDA under an Emergency Use Authorization (EUA). This EUA will remain  in effect (meaning this test can be used) for the  duration of the COVID-19 declaration under Section 56 4(b)(1) of the Act, 21 U.S.C. section 360bbb-3(b)(1), unless the authorization is terminated or revoked sooner. Performed at [facility], [address] [place], [place] [address]      Labs: BNP (last 3 results) Recent Labs    08/25/19 0429 08/26/19 0458 08/28/19 0558  BNP 1,264.0* 1,815.0* 1,326.0*   Basic Metabolic Panel: Recent Labs  Lab 08/28/19 0557 08/29/19 0528 08/30/19 0607 08/31/19 0617 09/01/19 0621  NA 130* 128* 132* 131* 131*  K 3.8 3.8 3.6 3.7 4.1  CL 97* 99 94* 93* 95*  CO2 22 23 28 28 25   GLUCOSE 99 116* 109* 150* 119*  BUN 21 18 11 10 8   CREATININE 0.92 0.81 0.61 0.66 0.51*  CALCIUM 8.4* 8.0* 8.0* 8.1* 7.7*  MG 2.0 1.7 1.3* 1.7 1.9   Liver Function Tests: Recent Labs  Lab 08/26/19 0458 08/27/19 0640 08/28/19 0557 08/29/19 0528  AST 93* 72* 51* 42*  ALT 18 18 16 14   ALKPHOS 76 58 60 67  BILITOT 2.5* 2.0* 2.0* 1.8*  PROT 7.5 6.7 6.4* 6.2*  ALBUMIN 2.5* 2.1* 2.0* 1.9*   No results for input(s): LIPASE, AMYLASE in the last 168 hours. No results for input(s): AMMONIA in the last 168 hours. CBC: Recent Labs  Lab 08/26/19 0458 08/27/19 0640 08/28/19 0557 08/29/19  [id number] 08/30/19 0607  WBC 12.7* 11.0* 8.6 8.5 10.0  NEUTROABS 10.6* 7.7 4.9 4.1  --   HGB 10.7* 9.8* 9.5* 10.3* 10.7*  HCT 33.0* 29.9* 28.5* 31.3* 31.9*  MCV 113.0* 111.6* 110.5* 111.8* 108.5*  PLT 67* 84* 73* 82* 99*   Cardiac Enzymes: No results for input(s): CKTOTAL, CKMB, CKMBINDEX, TROPONINI in the last 168 hours. BNP: Invalid input(s): POCBNP CBG: No results for input(s): GLUCAP in the last 168 hours. D-Dimer No results for input(s): DDIMER in the last 72 hours. Hgb A1c No results for input(s): HGBA1C in the last 72 hours. Lipid Profile No results for input(s): CHOL, HDL, LDLCALC, TRIG, CHOLHDL, LDLDIRECT in the last 72 hours. Thyroid function studies No results for input(s): TSH, T4TOTAL, T3FREE,  THYROIDAB in the last 72 hours.  Invalid input(s): FREET3 Anemia work up No results for input(s): VITAMINB12, FOLATE, FERRITIN, TIBC, IRON, RETICCTPCT in the last 72 hours. Urinalysis    Component Value Date/Time   COLORURINE AMBER (A) 08/24/2019 1135   APPEARANCEUR CLOUDY (A) 08/24/2019 1135   LABSPEC 1.015 08/24/2019 1135   PHURINE 5.0 08/24/2019 1135   GLUCOSEU NEGATIVE 08/24/2019 1135   HGBUR MODERATE (A) 08/24/2019 1135   HGBUR large 05/31/2010 1502   BILIRUBINUR NEGATIVE 08/24/2019 1135   KETONESUR NEGATIVE 08/24/2019 1135   PROTEINUR 30 (A) 08/24/2019 1135   UROBILINOGEN 1.0 12/26/2014 2150   NITRITE POSITIVE (A) 08/24/2019 1135   LEUKOCYTESUR LARGE (A) 08/24/2019 1135   Sepsis Labs Invalid input(s): PROCALCITONIN,  WBC,  LACTICIDVEN Microbiology Recent Results (from the past 240 hour(s))  Urine culture     Status: Abnormal   Collection Time: 08/24/19 11:35 AM   Specimen: Urine, Clean Catch  Result Value Ref Range Status   Specimen Description   Final    URINE, CLEAN CATCH Performed at [facility], [address] [place], [place] [address]    Special Requests   Final    NONE Performed at [facility], [address] [place], [place] [address]    Culture >=100,000 COLONIES/mL ESCHERICHIA COLI (A)  Final   Report Status 08/27/2019 FINAL  Final   Organism ID, Bacteria ESCHERICHIA COLI (A)  Final      Susceptibility   Escherichia coli - MIC*    AMPICILLIN 4 SENSITIVE Sensitive     CEFAZOLIN <=4 SENSITIVE Sensitive     CEFTRIAXONE <=0.25 SENSITIVE Sensitive     CIPROFLOXACIN <=0.25 SENSITIVE Sensitive     GENTAMICIN <=1 SENSITIVE Sensitive     IMIPENEM <=0.25 SENSITIVE Sensitive     NITROFURANTOIN <=16 SENSITIVE Sensitive     TRIMETH/SULFA <=20 SENSITIVE Sensitive     AMPICILLIN/SULBACTAM <=2 SENSITIVE Sensitive     PIP/TAZO <=4 SENSITIVE Sensitive     * >=100,000 COLONIES/mL ESCHERICHIA COLI  SARS CORONAVIRUS 2 (TAT 6-24 HRS) Nasopharyngeal Nasopharyngeal  Swab     Status: None   Collection Time: 08/24/19  8:36 PM   Specimen: Nasopharyngeal Swab  Result Value Ref Range Status   SARS Coronavirus 2 NEGATIVE NEGATIVE Final    Comment: (NOTE) SARS-CoV-2 target nucleic acids are NOT DETECTED. The SARS-CoV-2 RNA is generally detectable in upper and lower respiratory specimens during the acute phase of infection. Negative results do not preclude SARS-CoV-2 infection, do not rule out co-infections with other pathogens, and should not be used as the sole basis for treatment or other patient management decisions. Negative results must be combined with clinical observations, patient history, and epidemiological information. The expected result is Negative. Fact Sheet for Patients: HairSlick.nohttps://www.fda.gov/media/138098/download  Fact Sheet for Healthcare Providers: quierodirigir.com This test is not yet approved or cleared by the [place] FDA and  has been authorized for detection and/or diagnosis of SARS-CoV-2 by FDA under an Emergency Use Authorization (EUA). This EUA will remain  in effect (meaning this test can be used) for the duration of the COVID-19 declaration under Section 56 4(b)(1) of the Act, 21 U.S.C. section 360bbb-3(b)(1), unless the authorization is terminated or revoked sooner. Performed at [facility], [address] [place], [place] [address]      Time coordinating discharge: 35 minutes  SIGNED:   Erick Blinks, DO [facility] 09/01/2019, 9:55 AM  If 7PM-7AM, please contact night-coverage [email or web address]

## 2019-09-01 NOTE — Progress Notes (Signed)
Palliative: [name] is resting quietly in bed.  He greets me making and somewhat keeping eye contact.  He appears more frail than during our last meeting.  Present today at bedside his brother, Billy Stone.  We talked about Billy Stone acute and chronic health concerns, disposition to rehab.  Billy Stone and Billy Stone share concerns over Billy Stone's right hip and shoulder pain.  We talked about right rib fractures after fall.  Billy Stone also shares that Billy Stone seems to be having more congestion he thinks "bronchitis".  We talked about risk for pneumonia due to immobility, the importance of getting Billy Stone to rehab.  I encouraged Billy Stone to work closely with staff at rehab including physical therapy and physicians.  I shared that if Billy Stone continues to have pain, an x-ray can be taken.  We talk about CODE STATUS, "treat the treatable but allowing natural death".  Billy Stone shares that this is too much for him to discuss at this time.  I encouraged outpatient palliative, and he reluctantly agrees.   Plan:   Agreeable to rehab, outpatient palliative services to follow.  Considering CODE STATUS  35 minutes Billy Carmel, NP Palliative Medicine Team Team Phone # [phone] Greater than 50% of this time was spent counseling and coordinating care related to the above assessment and plan.

## 2019-09-01 NOTE — TOC Transition Note (Addendum)
Transition of Care [facility]) - CM/SW Discharge Note   Patient Details  Name: Billy Stone MRN: [id number] Date of Birth: October 26, 1950  Transition of [facility]) CM/SW Contact:  Annice Needy, LCSW Phone Number: 09/01/2019, 12:51 PM   Clinical Narrative:    Discharge clinicals sent to facility. Velna Hatchet at [facility] confirmed receipt and admission. RN to call EMS when patient is ready.  Dtr. Advised of d/c. Per Palliative care NP, Lillia Carmel, referral was made for Palliative care. Referral made to Renea Ee with [facility].  TOC signing off.      Barriers to Discharge: Continued Medical Work up   Patient Goals and CMS Choice Patient states their goals for this hospitalization and ongoing recovery are:: brother wants patient to be able to make his own decisions      Discharge Placement   Existing PASRR number confirmed : 08/28/19          Patient chooses bed at: [facility] Patient to be transferred to facility by: [facility] Name of family member notified: dtr, Marcelino Duster Patient and family notified of of transfer: 09/01/19  Discharge Plan and Services   Discharge Planning Services: CM Consult                                 Social Determinants of Health (SDOH) Interventions     Readmission Risk Interventions No flowsheet data found.
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
## 2019-09-03 ENCOUNTER — Encounter: Payer: Self-pay | Admitting: Student

## 2019-09-03 ENCOUNTER — Telehealth (INDEPENDENT_AMBULATORY_CARE_PROVIDER_SITE_OTHER): Payer: Medicare Other | Admitting: Student

## 2019-09-03 VITALS — BP 134/76 | HR 62 | Temp 98.2°F | Resp 18 | Ht 66.0 in | Wt 152.8 lb

## 2019-09-03 DIAGNOSIS — F1010 Alcohol abuse, uncomplicated: Secondary | ICD-10-CM

## 2019-09-03 DIAGNOSIS — I350 Nonrheumatic aortic (valve) stenosis: Secondary | ICD-10-CM

## 2019-09-03 DIAGNOSIS — I2510 Atherosclerotic heart disease of native coronary artery without angina pectoris: Secondary | ICD-10-CM

## 2019-09-03 DIAGNOSIS — I480 Paroxysmal atrial fibrillation: Secondary | ICD-10-CM | POA: Diagnosis not present

## 2019-09-03 DIAGNOSIS — I5032 Chronic diastolic (congestive) heart failure: Secondary | ICD-10-CM

## 2019-09-03 DIAGNOSIS — Z79899 Other long term (current) drug therapy: Secondary | ICD-10-CM

## 2019-09-03 NOTE — Patient Instructions (Addendum)
Medication Instructions:  Your physician recommends that you continue on your current medications as directed. Please refer to the Current Medication list given to you today.  *If you need a refill on your cardiac medications before your next appointment, please call your pharmacy*  Lab Work: Your physician recommends that you return for lab work in 1 week   If you have labs (blood work) drawn today and your tests are completely normal, you will receive your results only by: Marland Kitchen MyChart Message (if you have MyChart) OR . A paper copy in the mail If you have any lab test that is abnormal or we need to change your treatment, we will call you to review the results.  Testing/Procedures: NONE   Follow-Up: At [facility], you and your health needs are our priority.  As part of our continuing mission to provide you with exceptional heart care, we have created designated Provider Care Teams.  These Care Teams include your primary Cardiologist (physician) and Advanced Practice Providers (APPs -  Physician Assistants and Nurse Practitioners) who all work together to provide you with the care you need, when you need it.  Your next appointment:   3 month(s)  The format for your next appointment:   Virtual Visit   Provider:   Nona Dell, MD  Other Instructions Thank you for choosing [facility]!

## 2019-09-07 DIAGNOSIS — Z20828 Contact with and (suspected) exposure to other viral communicable diseases: Secondary | ICD-10-CM | POA: Diagnosis not present

## 2019-09-07 DIAGNOSIS — Z1383 Encounter for screening for respiratory disorder NEC: Secondary | ICD-10-CM | POA: Diagnosis not present

## 2019-09-14 DIAGNOSIS — Z1383 Encounter for screening for respiratory disorder NEC: Secondary | ICD-10-CM | POA: Diagnosis not present

## 2019-09-14 DIAGNOSIS — Z20828 Contact with and (suspected) exposure to other viral communicable diseases: Secondary | ICD-10-CM | POA: Diagnosis not present

## 2019-09-29 DIAGNOSIS — Z20828 Contact with and (suspected) exposure to other viral communicable diseases: Secondary | ICD-10-CM | POA: Diagnosis not present

## 2019-09-30 ENCOUNTER — Other Ambulatory Visit (HOSPITAL_COMMUNITY): Payer: Self-pay

## 2019-09-30 DIAGNOSIS — R05 Cough: Secondary | ICD-10-CM

## 2019-09-30 DIAGNOSIS — R059 Cough, unspecified: Secondary | ICD-10-CM

## 2019-09-30 DIAGNOSIS — R1310 Dysphagia, unspecified: Secondary | ICD-10-CM

## 2019-10-05 DIAGNOSIS — R32 Unspecified urinary incontinence: Secondary | ICD-10-CM | POA: Diagnosis not present

## 2019-10-05 DIAGNOSIS — Z466 Encounter for fitting and adjustment of urinary device: Secondary | ICD-10-CM | POA: Diagnosis not present

## 2019-10-05 DIAGNOSIS — R260 Ataxic gait: Secondary | ICD-10-CM | POA: Diagnosis not present

## 2019-10-05 DIAGNOSIS — D696 Thrombocytopenia, unspecified: Secondary | ICD-10-CM | POA: Diagnosis not present

## 2019-10-05 DIAGNOSIS — I4891 Unspecified atrial fibrillation: Secondary | ICD-10-CM | POA: Diagnosis not present

## 2019-10-05 DIAGNOSIS — I5032 Chronic diastolic (congestive) heart failure: Secondary | ICD-10-CM | POA: Diagnosis not present

## 2019-10-05 DIAGNOSIS — L89111 Pressure ulcer of right upper back, stage 1: Secondary | ICD-10-CM | POA: Diagnosis not present

## 2019-10-05 DIAGNOSIS — I110 Hypertensive heart disease with heart failure: Secondary | ICD-10-CM | POA: Diagnosis not present

## 2019-10-05 DIAGNOSIS — K7030 Alcoholic cirrhosis of liver without ascites: Secondary | ICD-10-CM | POA: Diagnosis not present

## 2019-10-05 DIAGNOSIS — M6281 Muscle weakness (generalized): Secondary | ICD-10-CM | POA: Diagnosis not present

## 2019-10-05 DIAGNOSIS — E512 Wernicke's encephalopathy: Secondary | ICD-10-CM | POA: Diagnosis not present

## 2019-10-05 DIAGNOSIS — S2241XD Multiple fractures of ribs, right side, subsequent encounter for fracture with routine healing: Secondary | ICD-10-CM | POA: Diagnosis not present

## 2019-10-06 ENCOUNTER — Other Ambulatory Visit: Payer: Self-pay

## 2019-10-06 ENCOUNTER — Ambulatory Visit (HOSPITAL_COMMUNITY)
Admission: RE | Admit: 2019-10-06 | Discharge: 2019-10-06 | Disposition: A | Discharge status: Home / Self Care | Payer: Medicare Other | Source: Ambulatory Visit | Attending: Internal Medicine | Admitting: Internal Medicine

## 2019-10-06 DIAGNOSIS — R059 Cough, unspecified: Secondary | ICD-10-CM

## 2019-10-06 DIAGNOSIS — R05 Cough: Secondary | ICD-10-CM | POA: Diagnosis not present

## 2019-10-06 DIAGNOSIS — R1310 Dysphagia, unspecified: Secondary | ICD-10-CM | POA: Insufficient documentation

## 2019-10-06 IMAGING — RF DG SWALLOWING FUNCTION
12 of 21 series · 12 of 24 positions shown · non-contrast
Comparison: None.

CLINICAL DATA: Dysphagia. Intermittent difficulty swallowing with
coughing while eating.

EXAM:
MODIFIED BARIUM SWALLOW
TECHNIQUE: Different consistencies of barium were administered orally to the
patient by the Speech Pathologist. Imaging of the pharynx was
performed in the lateral projection. The radiologist was present in
the fluoroscopy room for this study, providing personal supervision.
FLUOROSCOPY TIME:  Fluoroscopy Time:  1 minutes and 50 seconds
Radiation Exposure Index (if provided by the fluoroscopic device):
NA
Number of Acquired Spot Images: 0

[Series 2: run · 1 of 15 frames shown (1 of 12)]
[frame 1/15]
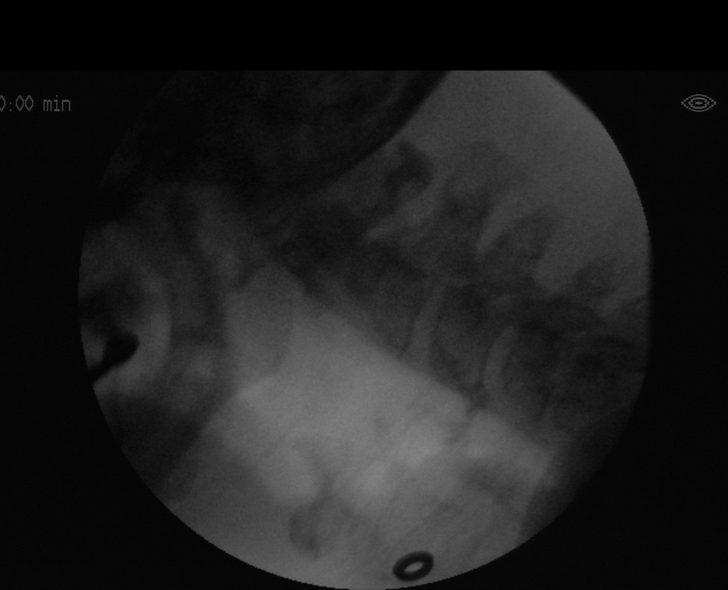

[Series 3: run · 1 of 179 frames shown (2 of 12)]
[frame 153/179]
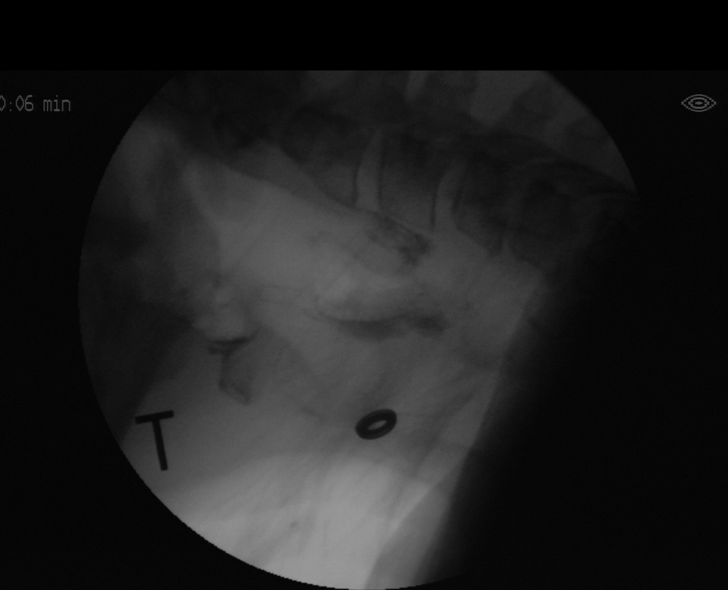

[Series 5: run · 1 of 117 frames shown (3 of 12)]
[frame 59/117]
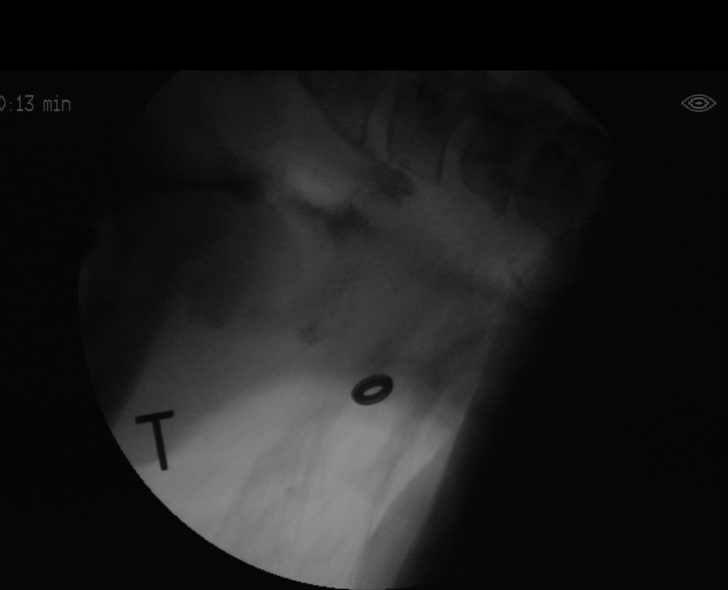

[Series 7: run · 1 of 134 frames shown (4 of 12)]
[frame 21/134]
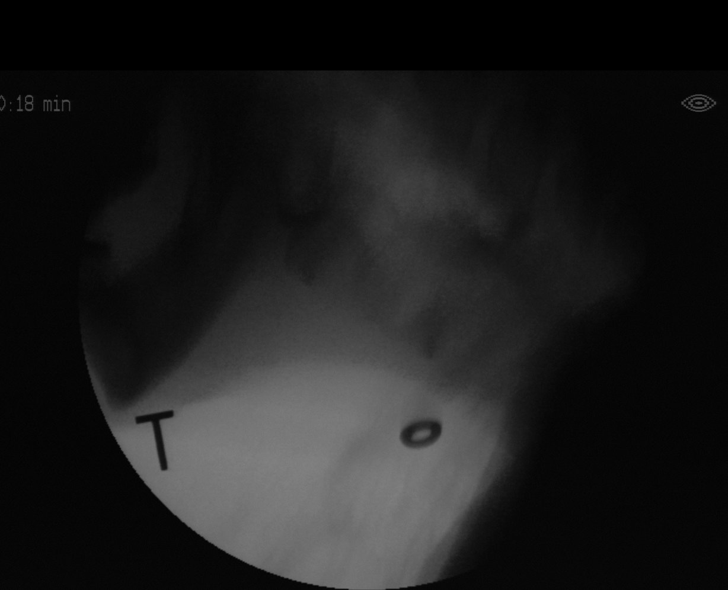

[Series 9: run · 1 of 177 frames shown (5 of 12)]
[frame 27/177]
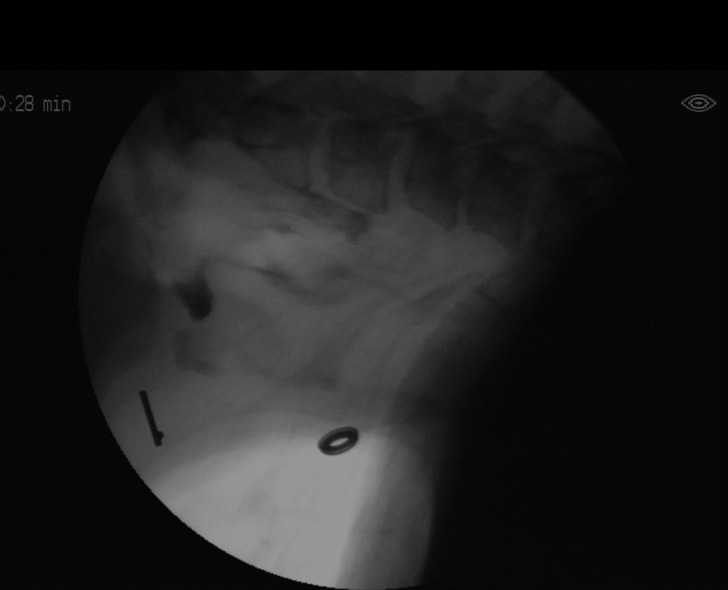

[Series 11: run · 1 of 183 frames shown (6 of 12)]
[frame 2/183]
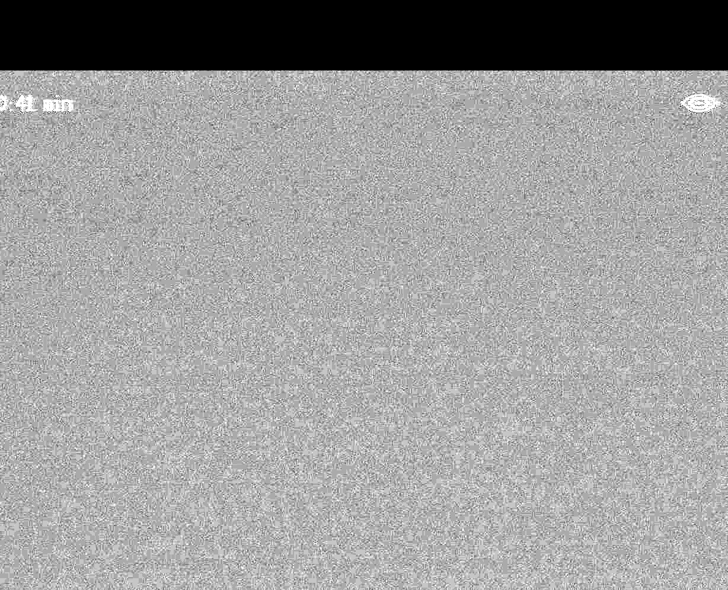

[Series 12: run · 1 of 97 frames shown (7 of 12)]
[frame 83/97]
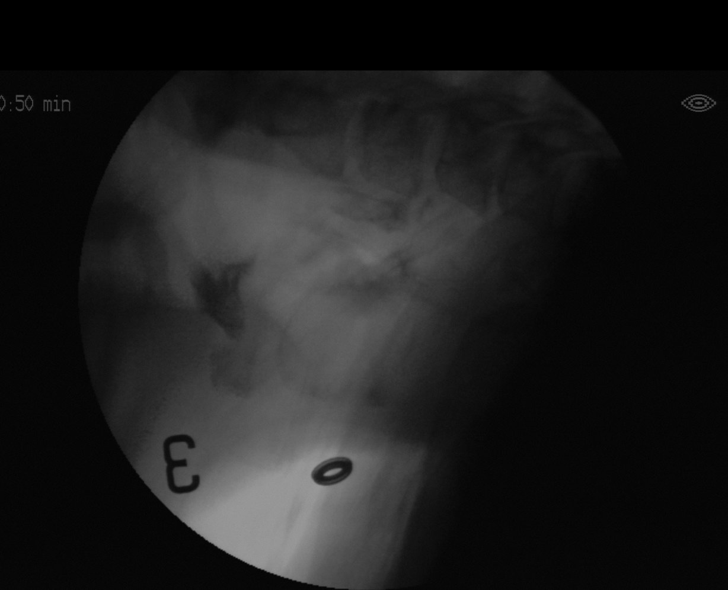

[Series 14: run · 1 of 569 frames shown (8 of 12)]
[frame 484/569]
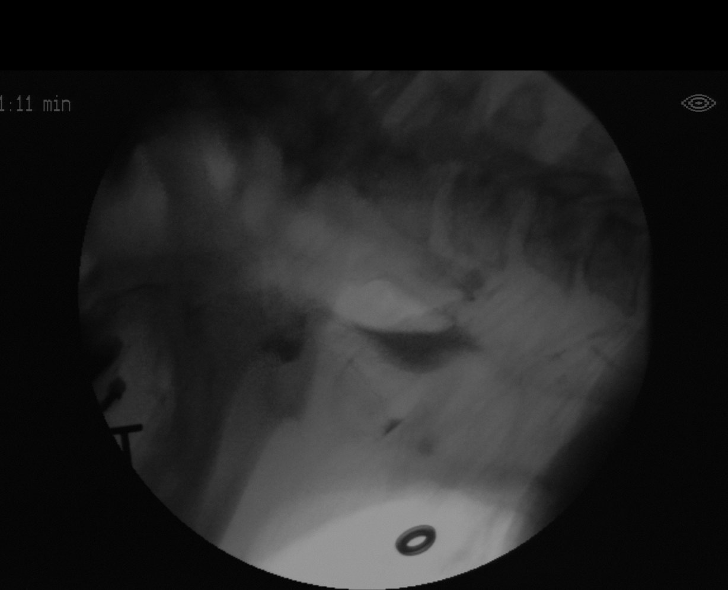

[Series 16: run · 1 of 79 frames shown (9 of 12)]
[frame 40/79]
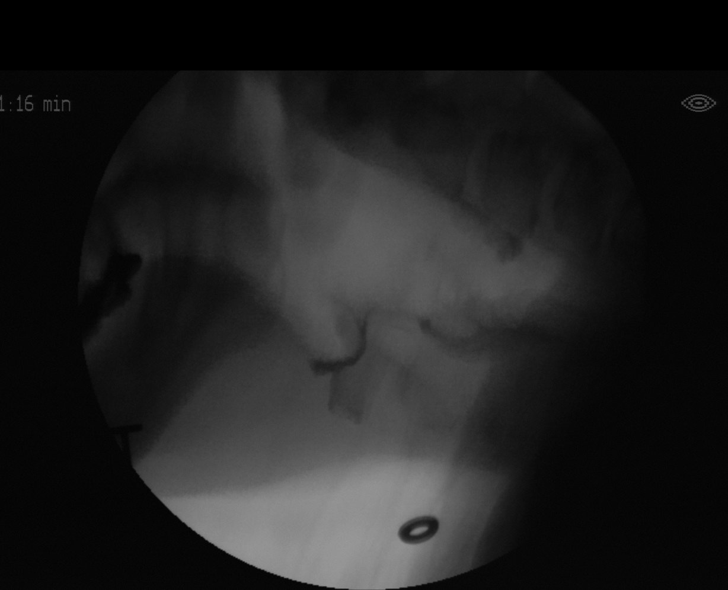

[Series 18: run · 1 of 36 frames shown (10 of 12)]
[frame 18/36]
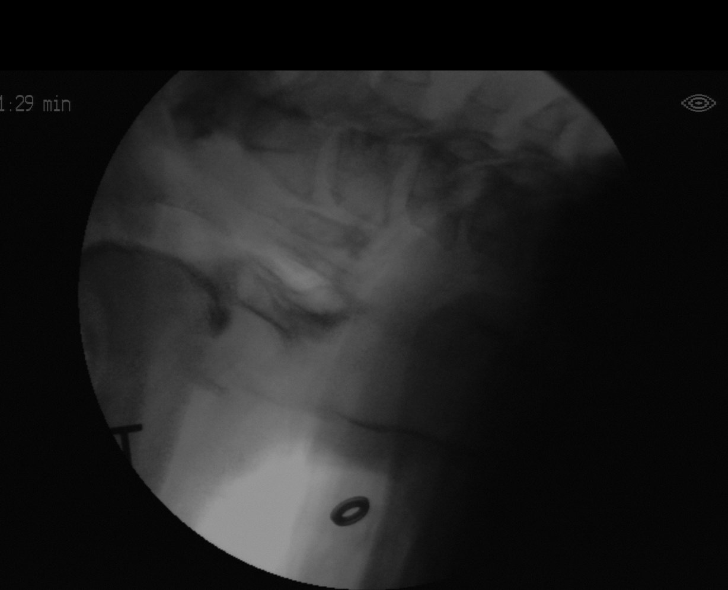

[Series 20: run · 1 of 165 frames shown (11 of 12)]
[frame 25/165]
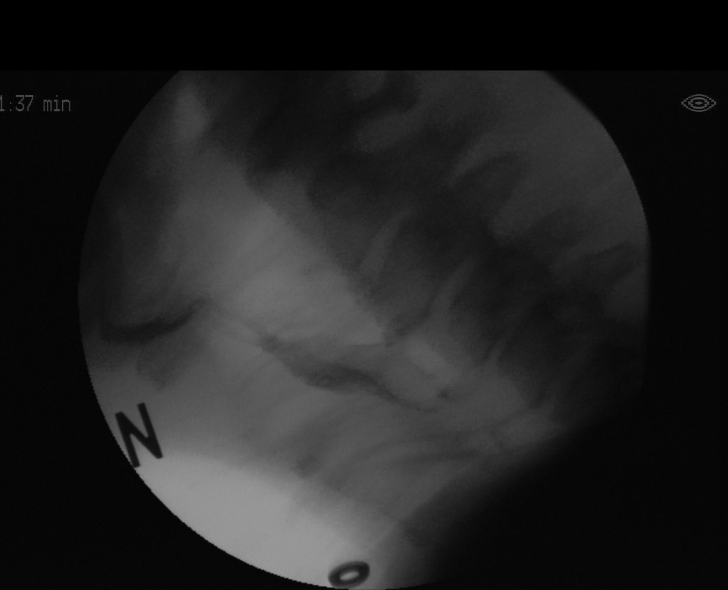

[Series 21: run · 1 of 275 frames shown (12 of 12)]
[frame 234/275]
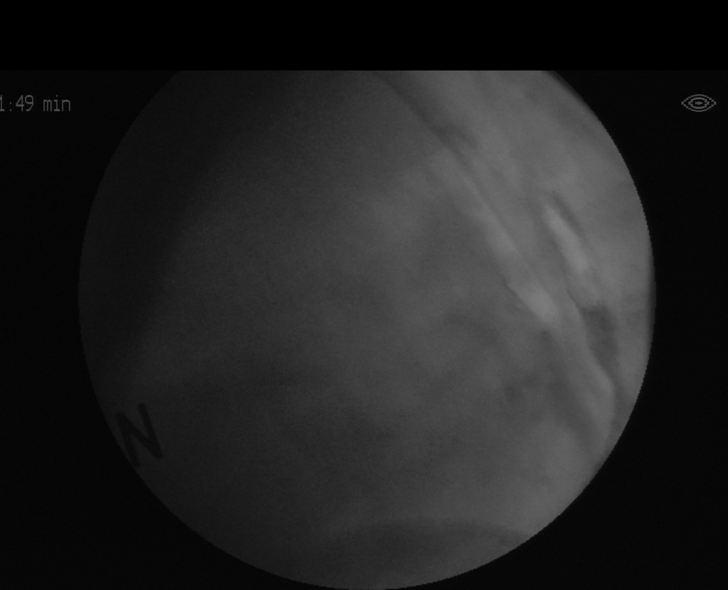

[12 of 24 positions shown; findings below may reference images not displayed]

FINDINGS: There was oral delay in vallecular pooling with multiple
consistencies. Laryngeal penetration and aspiration of thin barium
was noted with variable cough response. No aspiration was seen with
the additional consistencies.
IMPRESSION: Laryngeal penetration and aspiration with thin barium,
intermittently silent.

Please refer to the Speech Pathologists report for complete details
and recommendations.

## 2019-10-06 NOTE — Progress Notes (Signed)
Modified Barium Swallow Progress Note  Patient Details  Name: Billy Stone MRN: [id number] Date of Birth: [birth year]  Today's Date: 10/06/2019  Modified Barium Swallow completed.  Full report located under Chart Review in the Imaging Section.  Brief recommendations include the following:  Clinical Impression  Paitient presents with moderate pharyngeal dysphagia with thin and nectar thick liqids. Oral phase was unremarkable. Pharyngeal phase was remakrable for reduced BOT retraction and hyolaryngeal elevation resulting in vallecular and pyriform sinus residue, and a timing deficit resulting in aspiration. Aspiration only occured as SLP challenged the pt to chug thin and nectar thick liquids, specifically due to a timing deficit and pt's inability to hold his head in an upright position, resulting in a horizontal pharynx. Pt was able to sense aspiration on multiple swallows, but was also noted to also have silent aspiration. Pt was unable to clear aspirate out of airway despite Max cues from clinician. As the pt utilizied smaller sips at a slow pace with thin liquids, aspiration was prevented. Recommend continued regular diet and thin liquids, taking small bites/sips at a slow rate. Pt should sit upright for at least 30 minutes following eating/drinking. It was also noted that the pt is not completing proper oral care following his return home from SNF. SLP educated the pt the importance of completing oral care at least 2x/day in order to decrease oropharyngeal bacteria. Paitient was in agreement with all recommendations.    Swallow Evaluation Recommendations       SLP Diet Recommendations: Thin liquid;Regular solids   Liquid Administration via: Cup;Straw   Medication Administration: Whole meds with puree   Supervision: Patient able to self feed   Compensations: Minimize environmental distractions;Slow rate;Small sips/bites   Postural Changes: Remain semi-upright after after feeds/meals  (Comment);Seated upright at 90 degrees   Oral Care Recommendations: Patient independent with oral care       Maudry Mayhew, Student SLP Office: [phone]  10/06/2019,12:59 PM

## 2019-10-07 DIAGNOSIS — S2241XD Multiple fractures of ribs, right side, subsequent encounter for fracture with routine healing: Secondary | ICD-10-CM | POA: Diagnosis not present

## 2019-10-07 DIAGNOSIS — E512 Wernicke's encephalopathy: Secondary | ICD-10-CM | POA: Diagnosis not present

## 2019-10-07 DIAGNOSIS — I110 Hypertensive heart disease with heart failure: Secondary | ICD-10-CM | POA: Diagnosis not present

## 2019-10-07 DIAGNOSIS — R260 Ataxic gait: Secondary | ICD-10-CM | POA: Diagnosis not present

## 2019-10-07 DIAGNOSIS — M6281 Muscle weakness (generalized): Secondary | ICD-10-CM | POA: Diagnosis not present

## 2019-10-07 DIAGNOSIS — K7030 Alcoholic cirrhosis of liver without ascites: Secondary | ICD-10-CM | POA: Diagnosis not present

## 2019-10-09 DIAGNOSIS — K7030 Alcoholic cirrhosis of liver without ascites: Secondary | ICD-10-CM | POA: Diagnosis not present

## 2019-10-09 DIAGNOSIS — E512 Wernicke's encephalopathy: Secondary | ICD-10-CM | POA: Diagnosis not present

## 2019-10-09 DIAGNOSIS — I110 Hypertensive heart disease with heart failure: Secondary | ICD-10-CM | POA: Diagnosis not present

## 2019-10-09 DIAGNOSIS — S2241XD Multiple fractures of ribs, right side, subsequent encounter for fracture with routine healing: Secondary | ICD-10-CM | POA: Diagnosis not present

## 2019-10-09 DIAGNOSIS — R260 Ataxic gait: Secondary | ICD-10-CM | POA: Diagnosis not present

## 2019-10-09 DIAGNOSIS — M6281 Muscle weakness (generalized): Secondary | ICD-10-CM | POA: Diagnosis not present

## 2019-10-10 DIAGNOSIS — I110 Hypertensive heart disease with heart failure: Secondary | ICD-10-CM | POA: Diagnosis not present

## 2019-10-10 DIAGNOSIS — S2241XD Multiple fractures of ribs, right side, subsequent encounter for fracture with routine healing: Secondary | ICD-10-CM | POA: Diagnosis not present

## 2019-10-10 DIAGNOSIS — K7030 Alcoholic cirrhosis of liver without ascites: Secondary | ICD-10-CM | POA: Diagnosis not present

## 2019-10-10 DIAGNOSIS — R260 Ataxic gait: Secondary | ICD-10-CM | POA: Diagnosis not present

## 2019-10-10 DIAGNOSIS — M6281 Muscle weakness (generalized): Secondary | ICD-10-CM | POA: Diagnosis not present

## 2019-10-10 DIAGNOSIS — E512 Wernicke's encephalopathy: Secondary | ICD-10-CM | POA: Diagnosis not present

## 2019-10-14 ENCOUNTER — Ambulatory Visit
Admission: EM | Admit: 2019-10-14 | Discharge: 2019-10-14 | Disposition: A | Discharge status: Home / Self Care | Payer: Medicare Other | Attending: Emergency Medicine | Admitting: Emergency Medicine

## 2019-10-14 ENCOUNTER — Other Ambulatory Visit: Payer: Self-pay

## 2019-10-14 NOTE — ED Triage Notes (Signed)
Pt was due to have catheter  Removed by primary but has been unable to be seen. catheter has become painful and is leaking around meatus and pt pt wishes to have it removed . Catheter removed per protocol, pt tolerated well.

## 2019-10-18 DIAGNOSIS — E512 Wernicke's encephalopathy: Secondary | ICD-10-CM | POA: Diagnosis not present

## 2019-10-18 DIAGNOSIS — M6281 Muscle weakness (generalized): Secondary | ICD-10-CM | POA: Diagnosis not present

## 2019-10-18 DIAGNOSIS — S2241XD Multiple fractures of ribs, right side, subsequent encounter for fracture with routine healing: Secondary | ICD-10-CM | POA: Diagnosis not present

## 2019-10-18 DIAGNOSIS — K7030 Alcoholic cirrhosis of liver without ascites: Secondary | ICD-10-CM | POA: Diagnosis not present

## 2019-10-18 DIAGNOSIS — R260 Ataxic gait: Secondary | ICD-10-CM | POA: Diagnosis not present

## 2019-10-18 DIAGNOSIS — I110 Hypertensive heart disease with heart failure: Secondary | ICD-10-CM | POA: Diagnosis not present

## 2019-10-28 DIAGNOSIS — K7030 Alcoholic cirrhosis of liver without ascites: Secondary | ICD-10-CM | POA: Diagnosis not present

## 2019-10-28 DIAGNOSIS — M6281 Muscle weakness (generalized): Secondary | ICD-10-CM | POA: Diagnosis not present

## 2019-10-28 DIAGNOSIS — E512 Wernicke's encephalopathy: Secondary | ICD-10-CM | POA: Diagnosis not present

## 2019-10-28 DIAGNOSIS — R260 Ataxic gait: Secondary | ICD-10-CM | POA: Diagnosis not present

## 2019-10-28 DIAGNOSIS — I110 Hypertensive heart disease with heart failure: Secondary | ICD-10-CM | POA: Diagnosis not present

## 2019-10-28 DIAGNOSIS — S2241XD Multiple fractures of ribs, right side, subsequent encounter for fracture with routine healing: Secondary | ICD-10-CM | POA: Diagnosis not present

## 2019-10-29 DIAGNOSIS — E512 Wernicke's encephalopathy: Secondary | ICD-10-CM | POA: Diagnosis not present

## 2019-10-29 DIAGNOSIS — S2241XD Multiple fractures of ribs, right side, subsequent encounter for fracture with routine healing: Secondary | ICD-10-CM | POA: Diagnosis not present

## 2019-10-29 DIAGNOSIS — M6281 Muscle weakness (generalized): Secondary | ICD-10-CM | POA: Diagnosis not present

## 2019-10-29 DIAGNOSIS — K7030 Alcoholic cirrhosis of liver without ascites: Secondary | ICD-10-CM | POA: Diagnosis not present

## 2019-10-29 DIAGNOSIS — I110 Hypertensive heart disease with heart failure: Secondary | ICD-10-CM | POA: Diagnosis not present

## 2019-10-29 DIAGNOSIS — R260 Ataxic gait: Secondary | ICD-10-CM | POA: Diagnosis not present

## 2019-10-30 DIAGNOSIS — R260 Ataxic gait: Secondary | ICD-10-CM | POA: Diagnosis not present

## 2019-10-30 DIAGNOSIS — S2241XD Multiple fractures of ribs, right side, subsequent encounter for fracture with routine healing: Secondary | ICD-10-CM | POA: Diagnosis not present

## 2019-10-30 DIAGNOSIS — E512 Wernicke's encephalopathy: Secondary | ICD-10-CM | POA: Diagnosis not present

## 2019-10-30 DIAGNOSIS — I110 Hypertensive heart disease with heart failure: Secondary | ICD-10-CM | POA: Diagnosis not present

## 2019-10-30 DIAGNOSIS — M6281 Muscle weakness (generalized): Secondary | ICD-10-CM | POA: Diagnosis not present

## 2019-10-30 DIAGNOSIS — K7030 Alcoholic cirrhosis of liver without ascites: Secondary | ICD-10-CM | POA: Diagnosis not present

## 2019-11-03 DIAGNOSIS — S2241XD Multiple fractures of ribs, right side, subsequent encounter for fracture with routine healing: Secondary | ICD-10-CM | POA: Diagnosis not present

## 2019-11-03 DIAGNOSIS — E512 Wernicke's encephalopathy: Secondary | ICD-10-CM | POA: Diagnosis not present

## 2019-11-03 DIAGNOSIS — I110 Hypertensive heart disease with heart failure: Secondary | ICD-10-CM | POA: Diagnosis not present

## 2019-11-03 DIAGNOSIS — M6281 Muscle weakness (generalized): Secondary | ICD-10-CM | POA: Diagnosis not present

## 2019-11-03 DIAGNOSIS — K7030 Alcoholic cirrhosis of liver without ascites: Secondary | ICD-10-CM | POA: Diagnosis not present

## 2019-11-03 DIAGNOSIS — R260 Ataxic gait: Secondary | ICD-10-CM | POA: Diagnosis not present

## 2019-11-04 DIAGNOSIS — E512 Wernicke's encephalopathy: Secondary | ICD-10-CM | POA: Diagnosis not present

## 2019-11-05 DIAGNOSIS — I10 Essential (primary) hypertension: Secondary | ICD-10-CM | POA: Diagnosis not present

## 2019-11-05 DIAGNOSIS — F330 Major depressive disorder, recurrent, mild: Secondary | ICD-10-CM | POA: Diagnosis not present

## 2019-11-05 DIAGNOSIS — S91301A Unspecified open wound, right foot, initial encounter: Secondary | ICD-10-CM | POA: Diagnosis not present

## 2019-11-17 DIAGNOSIS — Z23 Encounter for immunization: Secondary | ICD-10-CM | POA: Diagnosis not present
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
## 2019-12-02 ENCOUNTER — Encounter: Payer: Self-pay | Admitting: Cardiology

## 2019-12-02 ENCOUNTER — Telehealth (INDEPENDENT_AMBULATORY_CARE_PROVIDER_SITE_OTHER): Payer: Medicare Other | Admitting: Cardiology

## 2019-12-02 VITALS — BP 120/67 | HR 64 | Ht 65.0 in | Wt 150.0 lb

## 2019-12-02 DIAGNOSIS — I480 Paroxysmal atrial fibrillation: Secondary | ICD-10-CM

## 2019-12-02 DIAGNOSIS — I25119 Atherosclerotic heart disease of native coronary artery with unspecified angina pectoris: Secondary | ICD-10-CM

## 2019-12-02 DIAGNOSIS — I350 Nonrheumatic aortic (valve) stenosis: Secondary | ICD-10-CM

## 2019-12-02 MED ORDER — NITROGLYCERIN 0.4 MG SL SUBL: 0.4000 mg | SUBLINGUAL_TABLET | SUBLINGUAL | 3 refills | Status: DC | PRN

## 2019-12-02 NOTE — Patient Instructions (Signed)
Medication Instructions:  Your physician recommends that you continue on your current medications as directed. Please refer to the Current Medication list given to you today.  *If you need a refill on your cardiac medications before your next appointment, please call your pharmacy*   Lab Work: None today If you have labs (blood work) drawn today and your tests are completely normal, you will receive your results only by: Marland Kitchen MyChart Message (if you have MyChart) OR . A paper copy in the mail If you have any lab test that is abnormal or we need to change your treatment, we will call you to review the results.   Testing/Procedures: None today   Follow-Up: At [facility], you and your health needs are our priority.  As part of our continuing mission to provide you with exceptional heart care, we have created designated Provider Care Teams.  These Care Teams include your primary Cardiologist (physician) and Advanced Practice Providers (APPs -  Physician Assistants and Nurse Practitioners) who all work together to provide you with the care you need, when you need it.  We recommend signing up for the patient portal called "MyChart".  Sign up information is provided on this After Visit Summary.  MyChart is used to connect with patients for Virtual Visits (Telemedicine).  Patients are able to view lab/test results, encounter notes, upcoming appointments, etc.  Non-urgent messages can be sent to your provider as well.   To learn more about what you can do with MyChart, go to [email or web address].    Your next appointment:   6 month(s)  The format for your next appointment:   In Person  Provider:   You may see Nona Dell, MD or one of the following Advanced Practice Providers on your designated Care Team:    Randall An, PA-C       Other Instructions None       Thank you for choosing [facility] !
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
## 2020-04-30 ENCOUNTER — Ambulatory Visit (INDEPENDENT_AMBULATORY_CARE_PROVIDER_SITE_OTHER): Payer: Medicare Other | Admitting: Cardiology

## 2020-04-30 ENCOUNTER — Other Ambulatory Visit: Payer: Self-pay

## 2020-04-30 ENCOUNTER — Encounter: Payer: Self-pay | Admitting: Cardiology

## 2020-04-30 VITALS — BP 124/58 | HR 66 | Ht 65.0 in | Wt 139.0 lb

## 2020-04-30 DIAGNOSIS — I480 Paroxysmal atrial fibrillation: Secondary | ICD-10-CM | POA: Diagnosis not present

## 2020-04-30 DIAGNOSIS — I350 Nonrheumatic aortic (valve) stenosis: Secondary | ICD-10-CM

## 2020-04-30 DIAGNOSIS — I25119 Atherosclerotic heart disease of native coronary artery with unspecified angina pectoris: Secondary | ICD-10-CM | POA: Diagnosis not present

## 2020-04-30 NOTE — Patient Instructions (Signed)
Medication Instructions:  °Your physician recommends that you continue on your current medications as directed. Please refer to the Current Medication list given to you today. ° °*If you need a refill on your cardiac medications before your next appointment, please call your pharmacy* ° ° °Lab Work: °None today °If you have labs (blood work) drawn today and your tests are completely normal, you will receive your results only by: °• MyChart Message (if you have MyChart) OR °• A paper copy in the mail °If you have any lab test that is abnormal or we need to change your treatment, we will call you to review the results. ° ° °Testing/Procedures: °Your physician has requested that you have an echocardiogram IN 6 MONTHS. Echocardiography is a painless test that uses sound waves to create images of your heart. It provides your doctor with information about the size and shape of your heart and how well your heart’s chambers and valves are working. This procedure takes approximately one hour. There are no restrictions for this procedure. ° ° ° ° °Follow-Up: °At [facility], you and your health needs are our priority.  As part of our continuing mission to provide you with exceptional heart care, we have created designated Provider Care Teams.  These Care Teams include your primary Cardiologist (physician) and Advanced Practice Providers (APPs -  Physician Assistants and Nurse Practitioners) who all work together to provide you with the care you need, when you need it. ° °We recommend signing up for the patient portal called "MyChart".  Sign up information is provided on this After Visit Summary.  MyChart is used to connect with patients for Virtual Visits (Telemedicine).  Patients are able to view lab/test results, encounter notes, upcoming appointments, etc.  Non-urgent messages can be sent to your provider as well.   °To learn more about what you can do with MyChart, go to [email or web address].   ° °Your next  appointment:   °6 month(s) ° °The format for your next appointment:   °In Person ° °Provider:   °Samuel McDowell, MD ° ° °Other Instructions °NONE ° ° ° °Thank you for choosing [facility] ! ° ° ° ° ° ° ° ° °

## 2020-05-05 ENCOUNTER — Other Ambulatory Visit: Payer: Self-pay

## 2020-05-05 MED ORDER — ISOSORBIDE MONONITRATE ER 30 MG PO TB24: 30.0000 mg | ORAL_TABLET | Freq: Every day | ORAL | 1 refills | Status: DC

## 2020-05-05 NOTE — Telephone Encounter (Signed)
Refilled Imdur 30 mg hs

## 2020-06-02 ENCOUNTER — Other Ambulatory Visit: Payer: Self-pay

## 2020-06-02 ENCOUNTER — Ambulatory Visit (INDEPENDENT_AMBULATORY_CARE_PROVIDER_SITE_OTHER): Payer: Medicare Other | Admitting: Gastroenterology

## 2020-06-02 ENCOUNTER — Encounter: Payer: Self-pay | Admitting: Gastroenterology

## 2020-06-02 VITALS — BP 126/66 | HR 68 | Temp 97.7°F | Ht 65.0 in | Wt 143.0 lb

## 2020-06-02 DIAGNOSIS — K921 Melena: Secondary | ICD-10-CM | POA: Insufficient documentation

## 2020-06-02 DIAGNOSIS — I25119 Atherosclerotic heart disease of native coronary artery with unspecified angina pectoris: Secondary | ICD-10-CM | POA: Diagnosis not present

## 2020-06-02 DIAGNOSIS — K7030 Alcoholic cirrhosis of liver without ascites: Secondary | ICD-10-CM | POA: Diagnosis not present

## 2020-06-02 DIAGNOSIS — K219 Gastro-esophageal reflux disease without esophagitis: Secondary | ICD-10-CM

## 2020-06-02 MED ORDER — OMEPRAZOLE 20 MG PO TBEC: 20.0000 mg | DELAYED_RELEASE_TABLET | Freq: Two times a day (BID) | ORAL | 1 refills | Status: DC
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
## 2020-06-02 NOTE — Patient Instructions (Signed)
1. Stop pantoprazole. Start omeprazole 20mg  twice daily before breakfast and evening meal. 2. Stop peptobismol. 3. Go to lab today or tomorrow. Once labs are back we will determine timing of upper endoscopy. 4. We will schedule updated liver ultrasound in near future but let's wait until labs and endoscopy are completed.  5. If you have lightheadedness, fatigue, dizziness, weakness and ongoing black stools (after stopping Pepto), please go to the ER.

## 2020-06-03 ENCOUNTER — Encounter: Payer: Self-pay | Admitting: Gastroenterology

## 2020-06-04 ENCOUNTER — Other Ambulatory Visit: Payer: Self-pay

## 2020-06-04 ENCOUNTER — Other Ambulatory Visit (HOSPITAL_COMMUNITY)
Admission: RE | Admit: 2020-06-04 | Discharge: 2020-06-04 | Disposition: A | Discharge status: Home / Self Care | Payer: Medicare Other | Source: Ambulatory Visit | Attending: Gastroenterology | Admitting: Gastroenterology

## 2020-06-04 DIAGNOSIS — K7030 Alcoholic cirrhosis of liver without ascites: Secondary | ICD-10-CM | POA: Insufficient documentation

## 2020-06-04 DIAGNOSIS — K219 Gastro-esophageal reflux disease without esophagitis: Secondary | ICD-10-CM | POA: Diagnosis not present

## 2020-06-04 DIAGNOSIS — K921 Melena: Secondary | ICD-10-CM | POA: Insufficient documentation

## 2020-06-04 LAB — CBC WITH DIFFERENTIAL/PLATELET
Basophils Absolute: 0 10*3/uL (ref 0.0–0.1)
Basophils Relative: 0 %
Eosinophils Absolute: 0.6 10*3/uL — ABNORMAL HIGH (ref 0.0–0.5)
Eosinophils Relative: 7 %
HCT: 34 % — ABNORMAL LOW (ref 39.0–52.0)
Hemoglobin: 11.4 g/dL — ABNORMAL LOW (ref 13.0–17.0)
Lymphocytes Relative: 41 %
Lymphs Abs: 3.6 10*3/uL (ref 0.7–4.0)
MCH: 31.1 pg (ref 26.0–34.0)
MCHC: 33.5 g/dL (ref 30.0–36.0)
MCV: 92.6 fL (ref 80.0–100.0)
Metamyelocytes Relative: 3 %
Monocytes Absolute: 0.1 10*3/uL (ref 0.1–1.0)
Monocytes Relative: 1 %
Myelocytes: 2 %
Neutro Abs: 3.6 10*3/uL (ref 1.7–7.7)
Neutrophils Relative %: 41 %
Other: 4 %
Platelets: 279 10*3/uL (ref 150–400)
Promyelocytes Relative: 1 %
RBC: 3.67 MIL/uL — ABNORMAL LOW (ref 4.22–5.81)
RDW: 14 % (ref 11.5–15.5)
WBC: 8.8 10*3/uL (ref 4.0–10.5)
nRBC: 0 % (ref 0.0–0.2)

## 2020-06-04 LAB — COMPREHENSIVE METABOLIC PANEL
ALT: 9 U/L (ref 0–44)
AST: 19 U/L (ref 15–41)
Albumin: 3.2 g/dL — ABNORMAL LOW (ref 3.5–5.0)
Alkaline Phosphatase: 129 U/L — ABNORMAL HIGH (ref 38–126)
Anion gap: 10 (ref 5–15)
BUN: 21 mg/dL (ref 8–23)
CO2: 22 mmol/L (ref 22–32)
Calcium: 8.9 mg/dL (ref 8.9–10.3)
Chloride: 97 mmol/L — ABNORMAL LOW (ref 98–111)
Creatinine, Ser: 0.77 mg/dL (ref 0.61–1.24)
GFR calc non Af Amer: >60 mL/min (ref >60)
Glucose, Bld: 152 mg/dL — ABNORMAL HIGH (ref 70–99)
Potassium: 3.6 mmol/L (ref 3.5–5.1)
Sodium: 129 mmol/L — ABNORMAL LOW (ref 135–145)
Total Bilirubin: 0.8 mg/dL (ref 0.3–1.2)
Total Protein: 8.2 g/dL — ABNORMAL HIGH (ref 6.5–8.1)

## 2020-06-04 LAB — PROTIME-INR
INR: 1.2 (ref 0.8–1.2)
Prothrombin Time: 14.9 seconds (ref 11.4–15.2)

## 2020-06-04 NOTE — Progress Notes (Signed)
Cc'ed to pcp °

## 2020-06-08 LAB — PATHOLOGIST SMEAR REVIEW: Path Review: 10112021

## 2020-06-09 ENCOUNTER — Ambulatory Visit: Payer: Medicare Other | Admitting: Cardiology

## 2020-06-09 ENCOUNTER — Other Ambulatory Visit: Payer: Self-pay

## 2020-06-09 DIAGNOSIS — K7460 Unspecified cirrhosis of liver: Secondary | ICD-10-CM

## 2020-06-17 ENCOUNTER — Telehealth: Payer: Self-pay | Admitting: *Deleted

## 2020-06-17 NOTE — Telephone Encounter (Signed)
Pt needs EGD with propofol with Dr. Jena Gauss, ASA 3  CALLED PT. He has been scheduled for 1/10 at 8:30am. Aware will mail prep instructions with pre-op/covid test appt. Confirmed mailing address.

## 2020-06-18 ENCOUNTER — Encounter: Payer: Self-pay | Admitting: *Deleted

## 2020-06-28 ENCOUNTER — Telehealth: Payer: Self-pay | Admitting: Gastroenterology

## 2020-06-28 DIAGNOSIS — K7460 Unspecified cirrhosis of liver: Secondary | ICD-10-CM

## 2020-06-28 NOTE — Telephone Encounter (Signed)
Since patient's EGD in not scheduled until 08/2020, please offer to proceed with RUQ u/s now for hepatoma screening. When he was in the office, we discussed waiting until after EGD.

## 2020-06-28 NOTE — Telephone Encounter (Signed)
Lmom, waiting on a return call.  

## 2020-06-29 ENCOUNTER — Telehealth: Payer: Self-pay | Admitting: *Deleted

## 2020-06-29 NOTE — Telephone Encounter (Signed)
Patient is agreeable to doing u/s.  Routing to clinical pool

## 2020-06-29 NOTE — Addendum Note (Signed)
Addended by: Armstead Peaks on: 06/29/2020 04:04 PM   Modules accepted: Orders

## 2020-06-29 NOTE — Telephone Encounter (Signed)
Called pt. He has been rescheduled to 2/14 at 11:15am. Patient aware will mail new prep instructions with new pre-op/covid test appt. Confirmed mailing address. called endo and LMOVM making aware of change.

## 2020-06-29 NOTE — Telephone Encounter (Signed)
We need to r/s procedure currently scheduled for 09/06/20. Called mobile, VM full. Called home # [id number]

## 2020-06-29 NOTE — Telephone Encounter (Signed)
Called pt. He is aware [place] scheduled for 11/10 at 8:30am, arrival 8:!5am, npo midnight. He voiced understanding

## 2020-06-30 ENCOUNTER — Encounter: Payer: Self-pay | Admitting: *Deleted

## 2020-07-07 ENCOUNTER — Ambulatory Visit (HOSPITAL_COMMUNITY)
Admission: RE | Admit: 2020-07-07 | Discharge: 2020-07-07 | Disposition: A | Discharge status: Home / Self Care | Payer: Medicare Other | Source: Ambulatory Visit | Attending: Gastroenterology | Admitting: Gastroenterology

## 2020-07-07 ENCOUNTER — Other Ambulatory Visit: Payer: Self-pay

## 2020-07-07 DIAGNOSIS — K7460 Unspecified cirrhosis of liver: Secondary | ICD-10-CM | POA: Insufficient documentation

## 2020-07-07 DIAGNOSIS — K8020 Calculus of gallbladder without cholecystitis without obstruction: Secondary | ICD-10-CM | POA: Diagnosis not present

## 2020-07-07 IMAGING — US US ABDOMEN LIMITED
1 series · 14 of 25 positions shown · non-contrast
Comparison: CT [DATE]

CLINICAL DATA: Cirrhosis

EXAM:
ULTRASOUND ABDOMEN LIMITED RIGHT UPPER QUADRANT

[Series 1: us abdomen limited ruq (liver/gb) · 14 of 63 slices shown]
[im 1/63]
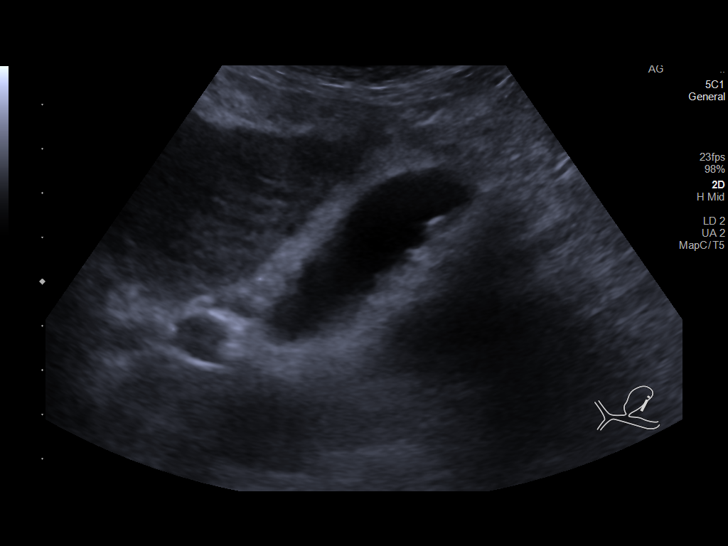
[im 6/63]
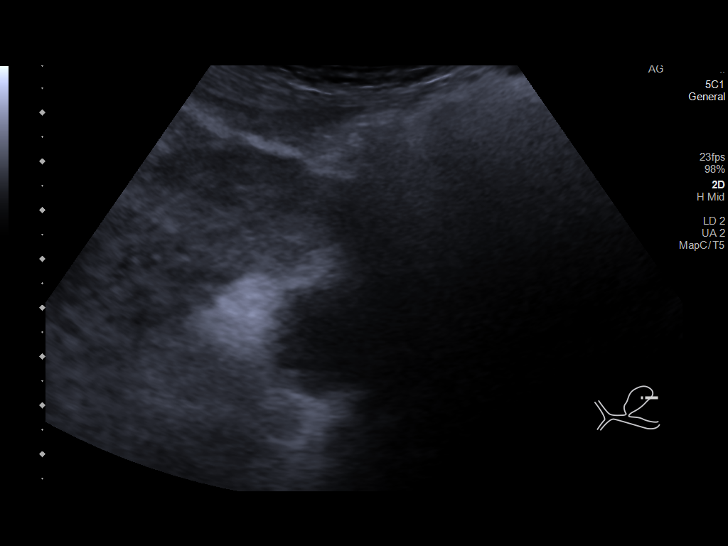
[im 11/63]
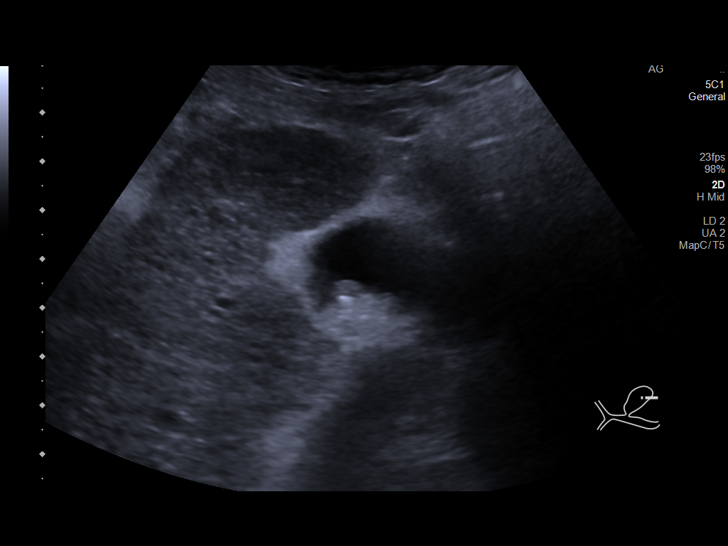
[im 16/63]
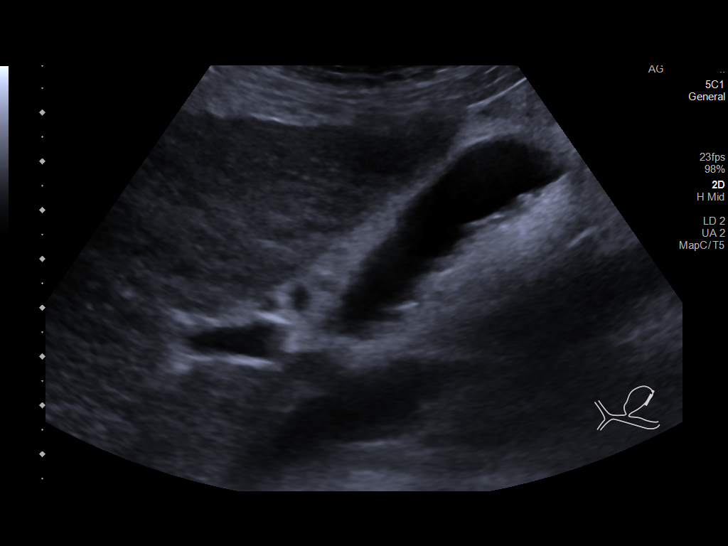
[im 21/63]
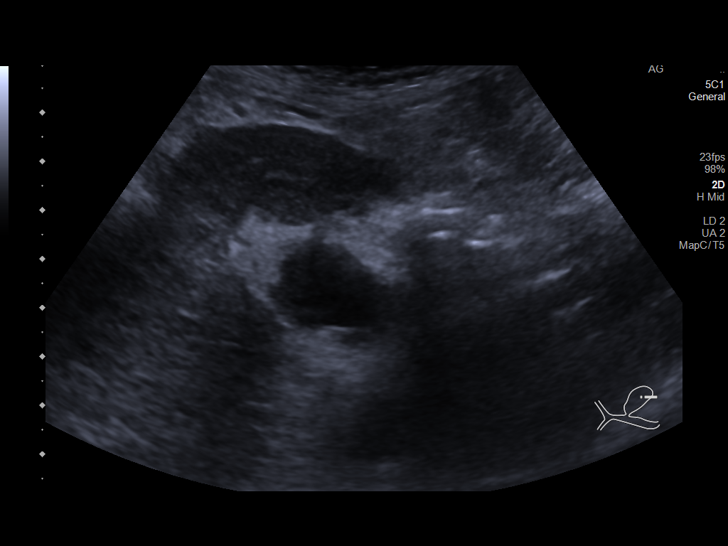
[im 24/63]
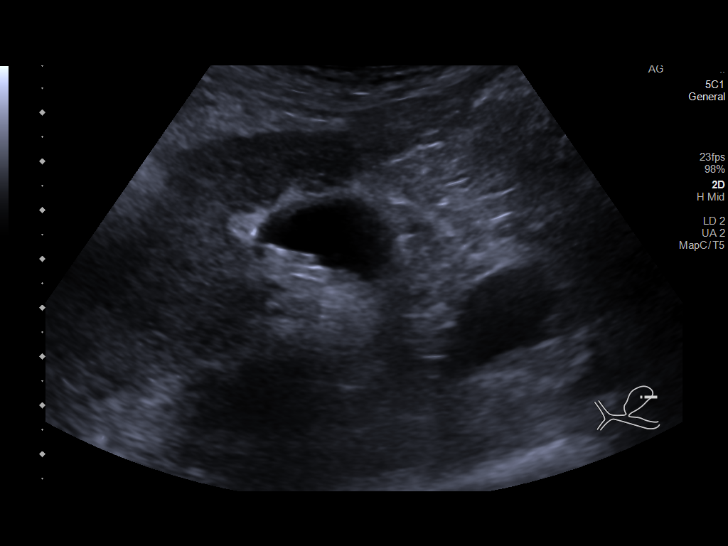
[im 29/63]
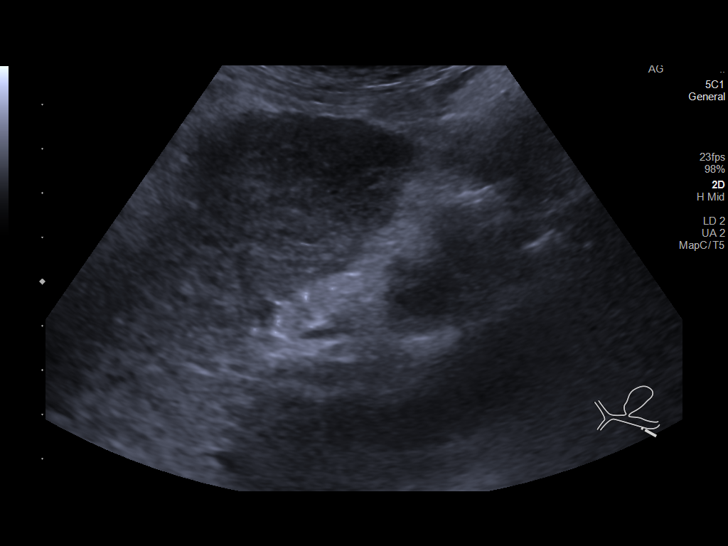
[im 34/63]
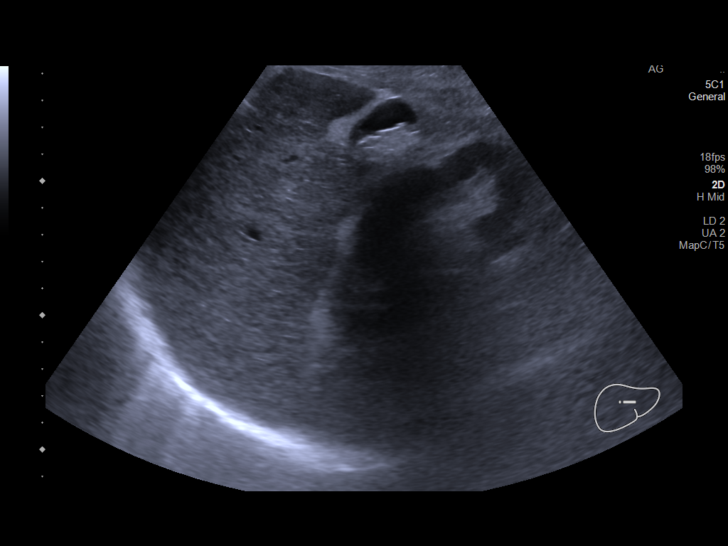
[im 39/63]
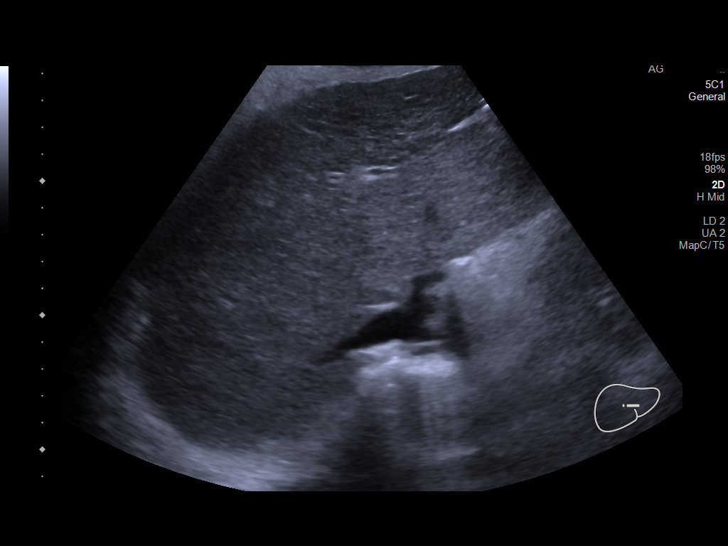
[im 42/63]
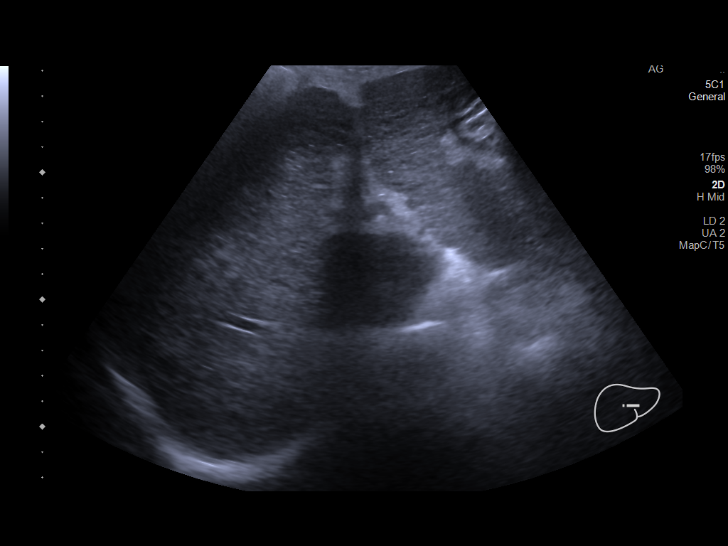
[im 47/63]
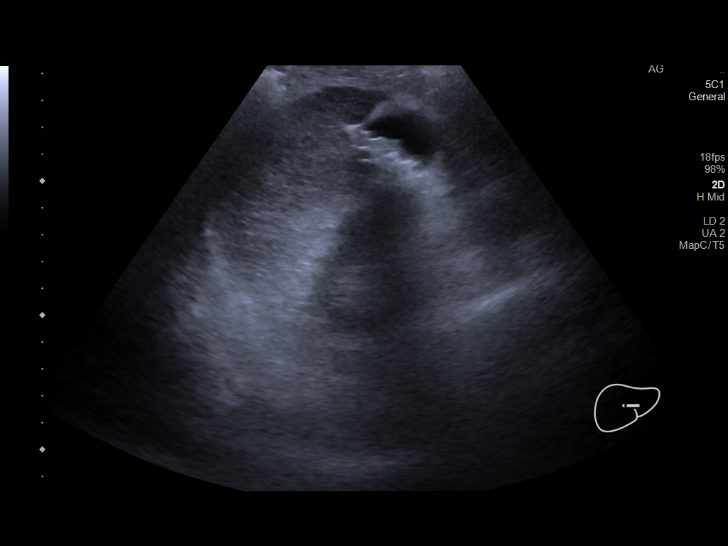
[im 52/63]
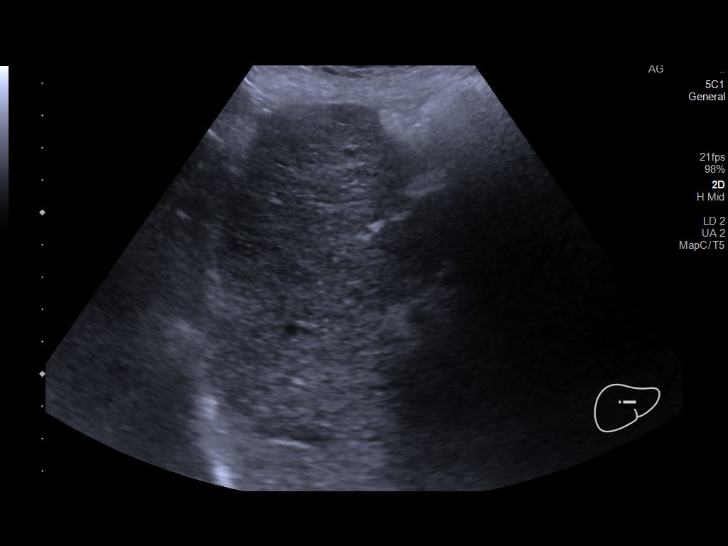
[im 57/63]
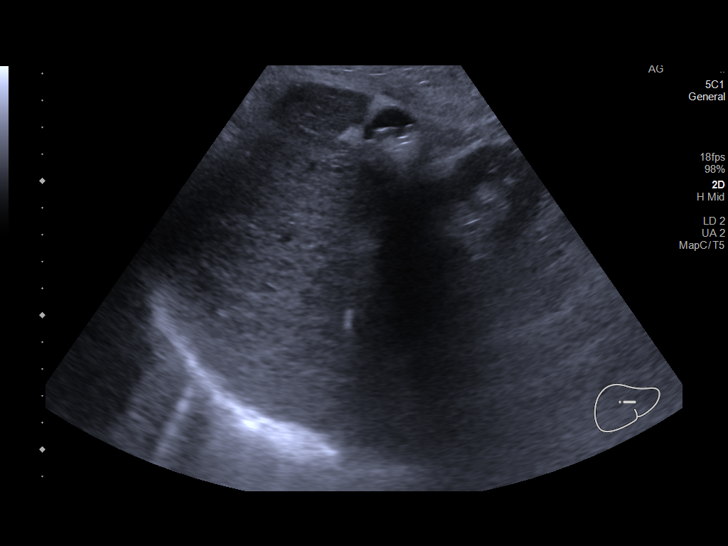
[im 63/63]
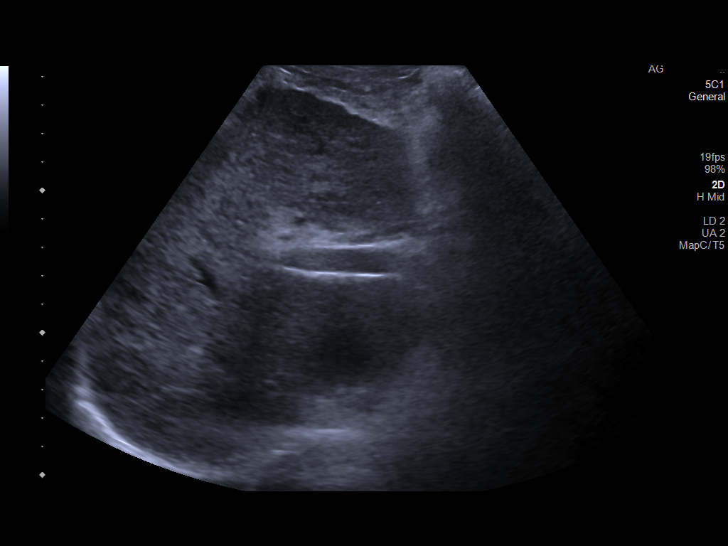

[14 of 25 positions shown; findings below may reference images not displayed]

FINDINGS: Gallbladder:

Multiple mobile gallstones layering within the gallbladder. No wall
thickening or sonographic Murphy sign.

Common bile duct:

Diameter: Normal caliber, 3 mm.

Liver:

Heterogeneous, increased echotexture. Mildly nodular contours.
Findings compatible with given history of cirrhosis. No focal
hepatic abnormality. Portal vein is patent on color Doppler imaging
with normal direction of blood flow towards the liver.

Other: None.
IMPRESSION: Morphologic changes compatible with cirrhosis.  No focal lesion.

Cholelithiasis.  No sonographic evidence of acute cholecystitis.

## 2020-07-14 ENCOUNTER — Other Ambulatory Visit: Payer: Self-pay

## 2020-07-14 DIAGNOSIS — K7030 Alcoholic cirrhosis of liver without ascites: Secondary | ICD-10-CM

## 2020-07-21 ENCOUNTER — Telehealth: Payer: Self-pay

## 2020-07-21 NOTE — Telephone Encounter (Signed)
Called pt to see if he wanted to move EGD to December. He wanted to keep procedure 10/11/20 as scheduled.

## 2020-07-27 ENCOUNTER — Telehealth: Payer: Self-pay | Admitting: Internal Medicine

## 2020-07-27 NOTE — Telephone Encounter (Signed)
Per [facility] 05/2020 OV note: We will schedule updated liver ultrasound in near future but let's wait until labs and endoscopy are completed.

## 2020-07-27 NOTE — Telephone Encounter (Signed)
Recall for ultrasound 

## 2020-08-09 ENCOUNTER — Other Ambulatory Visit: Payer: Self-pay

## 2020-08-09 DIAGNOSIS — K7030 Alcoholic cirrhosis of liver without ascites: Secondary | ICD-10-CM

## 2020-08-19 ENCOUNTER — Other Ambulatory Visit: Payer: Self-pay | Admitting: Gastroenterology

## 2020-08-25 ENCOUNTER — Other Ambulatory Visit: Payer: Self-pay

## 2020-08-25 MED ORDER — FUROSEMIDE 20 MG PO TABS
20.0000 mg | ORAL_TABLET | ORAL | 0 refills | Status: DC
Start: 2020-08-25 — End: 2021-07-07

## 2020-08-25 MED ORDER — METOPROLOL TARTRATE 50 MG PO TABS
50.0000 mg | ORAL_TABLET | Freq: Two times a day (BID) | ORAL | 3 refills | Status: DC
Start: 2020-08-25 — End: 2021-08-09

## 2020-08-25 NOTE — Telephone Encounter (Signed)
Medication refill  -Lopressor 50 mg -Lasix 20 mg

## 2020-09-02 ENCOUNTER — Other Ambulatory Visit (HOSPITAL_COMMUNITY): Payer: Medicare Other

## 2020-09-22 ENCOUNTER — Telehealth: Payer: Self-pay | Admitting: Internal Medicine

## 2020-09-22 NOTE — Telephone Encounter (Signed)
Pt has moved to [facility] and wants to cancel his procedure with Dr Jena Gauss on 10/11/2020.

## 2020-09-22 NOTE — Telephone Encounter (Signed)
Called pt, no answer and no VM 

## 2020-10-05 NOTE — Patient Instructions (Signed)
Billy Stone  10/05/2020     @PREFPERIOPPHARMACY @   Your procedure is scheduled on  10/11/2020   Report to [facility] at  0945  A.M.   Call this number if you have problems the morning of surgery:  [phone]   Remember:  Follow the diet instructions given to you by the office.                       Take these medicines the morning of surgery with A SIP OF WATER       Pepcid, metoprolol, prilosec, zoloft.    Please brush your teeth.  Do not wear jewelry, make-up or nail polish.  Do not wear lotions, powders, or perfumes, or deodorant.  Do not shave 48 hours prior to surgery.  Men may shave face and neck.  Do not bring valuables to the hospital.  [facility] is not responsible for any belongings or valuables.  Contacts, dentures or bridgework may not be worn into surgery.  Leave your suitcase in the car.  After surgery it may be brought to your room.  For patients admitted to the hospital, discharge time will be determined by your treatment team.  Patients discharged the day of surgery will not be allowed to drive home and musy have someone with them for 24 hours.    Special instructions:  DO NOT smoke tobacco or vape the morning of your procedure.   Please read over the following fact sheets that you were given. Anesthesia Post-op Instructions and Care and Recovery After Surgery       Upper Endoscopy, Adult, Care After This sheet gives you information about how to care for yourself after your procedure. Your health care provider may also give you more specific instructions. If you have problems or questions, contact your health care provider. What can I expect after the procedure? After the procedure, it is common to have:  A sore throat.  Mild stomach pain or discomfort.  Bloating.  Nausea. Follow these instructions at home:  Follow instructions from your health care provider about what to eat or drink after your procedure.  Return to your  normal activities as told by your health care provider. Ask your health care provider what activities are safe for you.  Take over-the-counter and prescription medicines only as told by your health care provider.  If you were given a sedative during the procedure, it can affect you for several hours. Do not drive or operate machinery until your health care provider says that it is safe.  Keep all follow-up visits as told by your health care provider. This is important.   Contact a health care provider if you have:  A sore throat that lasts longer than one day.  Trouble swallowing. Get help right away if:  You vomit blood or your vomit looks like coffee grounds.  You have: ? A fever. ? Bloody, black, or tarry stools. ? A severe sore throat or you cannot swallow. ? Difficulty breathing. ? Severe pain in your chest or abdomen. Summary  After the procedure, it is common to have a sore throat, mild stomach discomfort, bloating, and nausea.  If you were given a sedative during the procedure, it can affect you for several hours. Do not drive or operate machinery until your health care provider says that it is safe.  Follow instructions from your health care provider about what to eat or drink after your  procedure.  Return to your normal activities as told by your health care provider. This information is not intended to replace advice given to you by your health care provider. Make sure you discuss any questions you have with your health care provider. Document Revised: 08/12/2019 Document Reviewed: 01/14/2018 Elsevier Patient Education  2021 [employer] Monitored Anesthesia Care, Care After This sheet gives you information about how to care for yourself after your procedure. Your health care provider may also give you more specific instructions. If you have problems or questions, contact your health care provider. What can I expect after the procedure? After the procedure, it is  common to have:  Tiredness.  Forgetfulness about what happened after the procedure.  Impaired judgment for important decisions.  Nausea or vomiting.  Some difficulty with balance. Follow these instructions at home: For the time period you were told by your health care provider:  Rest as needed.  Do not participate in activities where you could fall or become injured.  Do not drive or use machinery.  Do not drink alcohol.  Do not take sleeping pills or medicines that cause drowsiness.  Do not make important decisions or sign legal documents.  Do not take care of children on your own.      Eating and drinking  Follow the diet that is recommended by your health care provider.  Drink enough fluid to keep your urine pale yellow.  If you vomit: ? Drink water, juice, or soup when you can drink without vomiting. ? Make sure you have little or no nausea before eating solid foods. General instructions  Have a responsible adult stay with you for the time you are told. It is important to have someone help care for you until you are awake and alert.  Take over-the-counter and prescription medicines only as told by your health care provider.  If you have sleep apnea, surgery and certain medicines can increase your risk for breathing problems. Follow instructions from your health care provider about wearing your sleep device: ? Anytime you are sleeping, including during daytime naps. ? While taking prescription pain medicines, sleeping medicines, or medicines that make you drowsy.  Avoid smoking.  Keep all follow-up visits as told by your health care provider. This is important. Contact a health care provider if:  You keep feeling nauseous or you keep vomiting.  You feel light-headed.  You are still sleepy or having trouble with balance after 24 hours.  You develop a rash.  You have a fever.  You have redness or swelling around the IV site. Get help right away  if:  You have trouble breathing.  You have new-onset confusion at home. Summary  For several hours after your procedure, you may feel tired. You may also be forgetful and have poor judgment.  Have a responsible adult stay with you for the time you are told. It is important to have someone help care for you until you are awake and alert.  Rest as told. Do not drive or operate machinery. Do not drink alcohol or take sleeping pills.  Get help right away if you have trouble breathing, or if you suddenly become confused. This information is not intended to replace advice given to you by your health care provider. Make sure you discuss any questions you have with your health care provider. Document Revised: 04/29/2020 Document Reviewed: 07/17/2019 Elsevier Patient Education  2021 [employer].

## 2020-10-08 ENCOUNTER — Other Ambulatory Visit (HOSPITAL_COMMUNITY): Payer: Medicare Other | Attending: Internal Medicine

## 2020-10-08 ENCOUNTER — Encounter (HOSPITAL_COMMUNITY): Payer: Self-pay

## 2020-10-08 ENCOUNTER — Encounter (HOSPITAL_COMMUNITY)
Admission: RE | Admit: 2020-10-08 | Discharge: 2020-10-08 | Disposition: A | Discharge status: Home / Self Care | Payer: Medicare Other | Source: Ambulatory Visit | Attending: Internal Medicine | Admitting: Internal Medicine

## 2020-10-09 NOTE — OR Nursing (Addendum)
Patient called due to no show for PAT and Covid test on Friday.  He stated that he called to cancel this procedure. Told him that I will remove him from our schedule on Monday.  I called office to leave a voice mail in regards to this procedure taken off of the schedule.

## 2020-10-11 ENCOUNTER — Ambulatory Visit (HOSPITAL_COMMUNITY): Admission: RE | Admit: 2020-10-11 | Payer: Medicare Other | Source: Home / Self Care | Admitting: Internal Medicine

## 2020-10-11 ENCOUNTER — Encounter (HOSPITAL_COMMUNITY): Admission: RE | Payer: Self-pay | Source: Home / Self Care

## 2020-10-11 SURGERY — ESOPHAGOGASTRODUODENOSCOPY (EGD) WITH PROPOFOL
Anesthesia: Monitor Anesthesia Care

## 2020-11-11 ENCOUNTER — Other Ambulatory Visit (HOSPITAL_COMMUNITY): Payer: Medicare Other

## 2020-11-11 ENCOUNTER — Ambulatory Visit: Payer: Medicare Other | Admitting: Cardiology

## 2020-11-16 ENCOUNTER — Ambulatory Visit (INDEPENDENT_AMBULATORY_CARE_PROVIDER_SITE_OTHER): Payer: Medicare Other | Admitting: Family Medicine

## 2020-11-16 ENCOUNTER — Other Ambulatory Visit: Payer: Self-pay

## 2020-11-16 ENCOUNTER — Encounter: Payer: Self-pay | Admitting: Family Medicine

## 2020-11-16 VITALS — BP 134/72 | HR 55 | Temp 97.9°F | Ht 63.0 in | Wt 145.8 lb

## 2020-11-16 DIAGNOSIS — Z23 Encounter for immunization: Secondary | ICD-10-CM

## 2020-11-16 DIAGNOSIS — I350 Nonrheumatic aortic (valve) stenosis: Secondary | ICD-10-CM | POA: Diagnosis not present

## 2020-11-16 DIAGNOSIS — M8949 Other hypertrophic osteoarthropathy, multiple sites: Secondary | ICD-10-CM | POA: Diagnosis not present

## 2020-11-16 DIAGNOSIS — F04 Amnestic disorder due to known physiological condition: Secondary | ICD-10-CM | POA: Diagnosis not present

## 2020-11-16 DIAGNOSIS — E538 Deficiency of other specified B group vitamins: Secondary | ICD-10-CM

## 2020-11-16 DIAGNOSIS — E871 Hypo-osmolality and hyponatremia: Secondary | ICD-10-CM | POA: Diagnosis not present

## 2020-11-16 DIAGNOSIS — I2510 Atherosclerotic heart disease of native coronary artery without angina pectoris: Secondary | ICD-10-CM | POA: Diagnosis not present

## 2020-11-16 DIAGNOSIS — K7031 Alcoholic cirrhosis of liver with ascites: Secondary | ICD-10-CM | POA: Diagnosis not present

## 2020-11-16 DIAGNOSIS — M159 Polyosteoarthritis, unspecified: Secondary | ICD-10-CM | POA: Insufficient documentation

## 2020-11-16 DIAGNOSIS — I4891 Unspecified atrial fibrillation: Secondary | ICD-10-CM | POA: Diagnosis not present

## 2020-11-16 DIAGNOSIS — I10 Essential (primary) hypertension: Secondary | ICD-10-CM | POA: Diagnosis not present

## 2020-11-16 MED ORDER — DICLOFENAC SODIUM 1 % EX GEL: 2.0000 g | Freq: Four times a day (QID) | CUTANEOUS | 3 refills | Status: DC

## 2020-11-16 NOTE — Patient Instructions (Addendum)
Need to sign release for records from Dr. Jena Gauss ([facility] [place]) and Dr. Margo Aye ([facility])
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
## 2020-11-17 ENCOUNTER — Telehealth: Payer: Self-pay | Admitting: Cardiology

## 2020-11-17 NOTE — Telephone Encounter (Signed)
Awaiting Dr. Orson Gear reply.

## 2020-11-17 NOTE — Telephone Encounter (Signed)
Dr. Diona Browner does not object to patient switching care. He also states that he would not object to patient seeing anyone in our practice

## 2020-11-17 NOTE — Telephone Encounter (Signed)
Dr. Diona Browner said that he doesn't have any specific recommendations as far as providers go.

## 2020-11-17 NOTE — Telephone Encounter (Signed)
He is wanting to know Dr. Ival Bible specific recommendation to a certain Cardiologist at the [facility] due to there being too many providers for him to choose from and not being familiar with any of them. Please advise.

## 2020-11-17 NOTE — Telephone Encounter (Signed)
Called pt due to receiving a referral. Pt states he has moved to [place] and would like to start seeing a Cardiologist in [place] to avoid extra travel. I explained this would require a Provider Switch and [place] requested a message be sent to Dr. Diona Browner requesting recommendations. Please advise.

## 2020-11-23 ENCOUNTER — Encounter: Payer: Self-pay | Admitting: Family Medicine

## 2020-11-23 DIAGNOSIS — K3189 Other diseases of stomach and duodenum: Secondary | ICD-10-CM | POA: Insufficient documentation

## 2020-11-23 DIAGNOSIS — K766 Portal hypertension: Secondary | ICD-10-CM | POA: Insufficient documentation

## 2020-11-23 DIAGNOSIS — K31819 Angiodysplasia of stomach and duodenum without bleeding: Secondary | ICD-10-CM | POA: Insufficient documentation

## 2020-11-30 ENCOUNTER — Encounter: Payer: Self-pay | Admitting: Family Medicine

## 2020-12-02 NOTE — Telephone Encounter (Signed)
    Pt decided to switch with Dr. Bjorn Pippin since [facility] office is closer to him  Please advise for approval

## 2020-12-03 NOTE — Telephone Encounter (Signed)
OK with me.

## 2020-12-15 ENCOUNTER — Ambulatory Visit (HOSPITAL_COMMUNITY)
Admission: RE | Admit: 2020-12-15 | Discharge: 2020-12-15 | Disposition: A | Discharge status: Home / Self Care | Payer: Medicare Other | Source: Ambulatory Visit | Attending: Cardiology | Admitting: Cardiology

## 2020-12-15 ENCOUNTER — Other Ambulatory Visit: Payer: Self-pay

## 2020-12-15 DIAGNOSIS — R9431 Abnormal electrocardiogram [ECG] [EKG]: Secondary | ICD-10-CM | POA: Diagnosis not present

## 2020-12-15 DIAGNOSIS — I2510 Atherosclerotic heart disease of native coronary artery without angina pectoris: Secondary | ICD-10-CM | POA: Diagnosis not present

## 2020-12-15 DIAGNOSIS — I350 Nonrheumatic aortic (valve) stenosis: Secondary | ICD-10-CM | POA: Diagnosis not present

## 2020-12-15 DIAGNOSIS — I352 Nonrheumatic aortic (valve) stenosis with insufficiency: Secondary | ICD-10-CM | POA: Insufficient documentation

## 2020-12-15 DIAGNOSIS — I119 Hypertensive heart disease without heart failure: Secondary | ICD-10-CM | POA: Insufficient documentation

## 2020-12-15 LAB — ECHOCARDIOGRAM COMPLETE
AR max vel: 1.18 cm2
AV Area VTI: 0.96 cm2
AV Area mean vel: 1.01 cm2
AV Mean grad: 12.4 mmHg
AV Peak grad: 23.3 mmHg
Ao pk vel: 2.42 m/s
Area-P 1/2: 2.84 cm2
Calc EF: 63.5 %
S' Lateral: 2.6 cm
Single Plane A2C EF: 65.5 %
Single Plane A4C EF: 60.2 %

## 2020-12-15 NOTE — Progress Notes (Signed)
  Echocardiogram 2D Echocardiogram has been performed.  Janalyn Harder 12/15/2020, 3:56 PM

## 2020-12-28 ENCOUNTER — Ambulatory Visit: Payer: Medicare Other | Admitting: Family Medicine

## 2021-01-04 ENCOUNTER — Other Ambulatory Visit: Payer: Self-pay

## 2021-01-05 ENCOUNTER — Encounter: Payer: Self-pay | Admitting: Family Medicine

## 2021-01-05 ENCOUNTER — Ambulatory Visit (INDEPENDENT_AMBULATORY_CARE_PROVIDER_SITE_OTHER): Payer: Medicare Other | Admitting: Family Medicine

## 2021-01-05 VITALS — BP 118/66 | HR 57 | Temp 97.6°F | Ht 63.0 in | Wt 147.6 lb

## 2021-01-05 DIAGNOSIS — I517 Cardiomegaly: Secondary | ICD-10-CM | POA: Diagnosis not present

## 2021-01-05 DIAGNOSIS — I10 Essential (primary) hypertension: Secondary | ICD-10-CM

## 2021-01-05 DIAGNOSIS — I25118 Atherosclerotic heart disease of native coronary artery with other forms of angina pectoris: Secondary | ICD-10-CM

## 2021-01-05 DIAGNOSIS — I350 Nonrheumatic aortic (valve) stenosis: Secondary | ICD-10-CM | POA: Diagnosis not present

## 2021-01-05 DIAGNOSIS — K7031 Alcoholic cirrhosis of liver with ascites: Secondary | ICD-10-CM | POA: Diagnosis not present

## 2021-01-05 DIAGNOSIS — F1021 Alcohol dependence, in remission: Secondary | ICD-10-CM

## 2021-01-05 DIAGNOSIS — I4891 Unspecified atrial fibrillation: Secondary | ICD-10-CM

## 2021-01-05 LAB — COMPREHENSIVE METABOLIC PANEL
ALT: 16 U/L (ref 0–53)
AST: 22 U/L (ref 0–37)
Albumin: 3.6 g/dL (ref 3.5–5.2)
Alkaline Phosphatase: 207 U/L — ABNORMAL HIGH (ref 39–117)
BUN: 16 mg/dL (ref 6–23)
CO2: 28 mEq/L (ref 19–32)
Calcium: 9.3 mg/dL (ref 8.4–10.5)
Chloride: 99 mEq/L (ref 96–112)
Creatinine, Ser: 0.75 mg/dL (ref 0.40–1.50)
GFR: 90.39 mL/min (ref >60.00)
Glucose, Bld: 101 mg/dL — ABNORMAL HIGH (ref 70–99)
Potassium: 4.7 mEq/L (ref 3.5–5.1)
Sodium: 135 mEq/L (ref 135–145)
Total Bilirubin: 1.1 mg/dL (ref 0.2–1.2)
Total Protein: 8 g/dL (ref 6.0–8.3)

## 2021-01-05 LAB — MAGNESIUM: Magnesium: 1.9 mg/dL (ref 1.5–2.5)

## 2021-01-05 LAB — CBC
HCT: 37.5 % — ABNORMAL LOW (ref 39.0–52.0)
Hemoglobin: 12.3 g/dL — ABNORMAL LOW (ref 13.0–17.0)
MCHC: 32.8 g/dL (ref 30.0–36.0)
MCV: 92.4 fl (ref 78.0–100.0)
Platelets: 358 10*3/uL (ref 150.0–400.0)
RBC: 4.06 Mil/uL — ABNORMAL LOW (ref 4.22–5.81)
RDW: 16 % — ABNORMAL HIGH (ref 11.5–15.5)
WBC: 9.8 10*3/uL (ref 4.0–10.5)
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
## 2021-01-06 ENCOUNTER — Other Ambulatory Visit: Payer: Self-pay

## 2021-01-06 ENCOUNTER — Encounter: Payer: Self-pay | Admitting: Cardiology

## 2021-01-06 ENCOUNTER — Ambulatory Visit (INDEPENDENT_AMBULATORY_CARE_PROVIDER_SITE_OTHER): Payer: Medicare Other | Admitting: Cardiology

## 2021-01-06 ENCOUNTER — Ambulatory Visit (INDEPENDENT_AMBULATORY_CARE_PROVIDER_SITE_OTHER): Payer: Medicare Other

## 2021-01-06 VITALS — BP 126/70 | HR 60 | Ht 63.0 in | Wt 148.2 lb

## 2021-01-06 DIAGNOSIS — I4891 Unspecified atrial fibrillation: Secondary | ICD-10-CM

## 2021-01-06 DIAGNOSIS — I77819 Aortic ectasia, unspecified site: Secondary | ICD-10-CM

## 2021-01-06 DIAGNOSIS — K7460 Unspecified cirrhosis of liver: Secondary | ICD-10-CM

## 2021-01-06 DIAGNOSIS — I350 Nonrheumatic aortic (valve) stenosis: Secondary | ICD-10-CM | POA: Diagnosis not present

## 2021-01-06 NOTE — Progress Notes (Unsigned)
Patient enrolled for [employer] to ship a 14 day ZIO XT long term holter monitor to his home. 

## 2021-01-06 NOTE — Patient Instructions (Signed)
Medication Instructions:  Your physician recommends that you continue on your current medications as directed. Please refer to the Current Medication list given to you today.  *If you need a refill on your cardiac medications before your next appointment, please call your pharmacy*  Testing/Procedures: [place] Monitor Instructions   Your physician has requested you wear a ZIO patch monitor for _14__ days.  This is a single patch monitor.   [employer] supplies one patch monitor per enrollment. Additional stickers are not available. Please do not apply patch if you will be having a Nuclear Stress Test, Echocardiogram, Cardiac CT, MRI, or Chest Xray during the period you would be wearing the monitor. The patch cannot be worn during these tests. You cannot remove and re-apply the ZIO XT patch monitor.  Your ZIO patch monitor will be sent Fed Ex from [employer] directly to your home address. It may take 3-5 days to receive your monitor after you have been enrolled.  Once you have received your monitor, please review the enclosed instructions. Your monitor has already been registered assigning a specific monitor serial # to you.  Billing and Patient Assistance Program Information   We have supplied [employer] with any of your insurance information on file for billing purposes. [employer] offers a sliding scale Patient Assistance Program for patients that do not have insurance, or whose insurance does not completely cover the cost of the ZIO monitor.   You must apply for the Patient Assistance Program to qualify for this discounted rate.     To apply, please call [employer] at [phone], select option 4, then select option 2, and ask to apply for Patient Assistance Program.  Theodore Demark will ask your household income, and how many people are in your household.  They will quote your out-of-pocket cost based on that information.  [employer] will also be able to set up a 74-month interest-free payment  plan if needed.  Applying the monitor   Shave hair from upper left chest.  Hold abrader disc by orange tab. Rub abrader in 40 strokes over the upper left chest as indicated in your monitor instructions.  Clean area with 4 enclosed alcohol pads. Let dry.  Apply patch as indicated in monitor instructions. Patch will be placed under collarbone on left side of chest with arrow pointing upward.  Rub patch adhesive wings for 2 minutes. Remove white label marked "1". Remove the white label marked "2". Rub patch adhesive wings for 2 additional minutes.  While looking in a mirror, press and release button in center of patch. A small green light will flash 3-4 times. This will be your only indicator that the monitor has been turned on. ?  Do not shower for the first 24 hours. You may shower after the first 24 hours.  Press the button if you feel a symptom. You will hear a small click. Record Date, Time and Symptom in the Patient Logbook.  When you are ready to remove the patch, follow instructions on the last 2 pages of the Patient Logbook. Stick patch monitor onto the last page of Patient Logbook.  Place Patient Logbook in the blue and white box.  Use locking tab on box and tape box closed securely.  The blue and white box has prepaid postage on it. Please place it in the mailbox as soon as possible. Your physician should have your test results approximately 7 days after the monitor has been mailed [facility] to [facility]  Call [facility]  at [phone] if you have questions regarding your ZIO XT patch monitor. Call them immediately if you see an orange light blinking on your monitor.  If your monitor falls off in less than 4 days, contact our Monitor department at [phone]. ?If your monitor becomes loose or falls off after 4 days call [employer] at [phone] for suggestions on securing your monitor.?   Follow-Up: At [facility], you and your health needs are our priority.  As  part of our continuing mission to provide you with exceptional heart care, we have created designated Provider Care Teams.  These Care Teams include your primary Cardiologist (physician) and Advanced Practice Providers (APPs -  Physician Assistants and Nurse Practitioners) who all work together to provide you with the care you need, when you need it.  We recommend signing up for the patient portal called "MyChart".  Sign up information is provided on this After Visit Summary.  MyChart is used to connect with patients for Virtual Visits (Telemedicine).  Patients are able to view lab/test results, encounter notes, upcoming appointments, etc.  Non-urgent messages can be sent to your provider as well.   To learn more about what you can do with MyChart, go to [email or web address].    Your next appointment:   3 month(s)  The format for your next appointment:   In Person  Provider:   Epifanio Lesches, MD   Other Instructions You have been referred to: [facility]

## 2021-01-06 NOTE — Progress Notes (Signed)
Anemia is improving. Prior issues with electrolytes, magnesium, and elevated liver studies are resolved. Very important for Mr. Hellums to continue his sobriety.

## 2021-01-09 DIAGNOSIS — I4891 Unspecified atrial fibrillation: Secondary | ICD-10-CM

## 2021-01-19 ENCOUNTER — Telehealth: Payer: Self-pay | Admitting: Cardiology

## 2021-01-19 NOTE — Telephone Encounter (Signed)
Routed to MD/RN to advise if another monitor is needed - patient only wore monitor 2 days

## 2021-01-19 NOTE — Telephone Encounter (Signed)
Returned call to [facility] and confirmed patient will need a replacement ZIO XT monitor shipped to his home.

## 2021-01-19 NOTE — Telephone Encounter (Signed)
SAM FROM [employer] REACHING OUT ON PT BEHALF STATING THAT PTS MONITORING DEVICE FELL OFF AFTER TWO DAYS, PT HAS BEEN ADVISED TO SEND DEVICE BACK BUT I [facility] HAS NOT YET RECEIVED IT BACK, SAM WOULD LIKE TO KNOW HOW TO PROCEED.... IF SHE SHOULD SEND ANOTHER DEVICE OR WAIT UNTIL PREV ONE IS RECEIVED.

## 2021-02-02 ENCOUNTER — Other Ambulatory Visit: Payer: Self-pay | Admitting: Cardiology

## 2021-02-02 NOTE — Telephone Encounter (Signed)
Pt sees Dr. Bjorn Pippin now.

## 2021-02-08 DIAGNOSIS — I4891 Unspecified atrial fibrillation: Secondary | ICD-10-CM | POA: Diagnosis not present

## 2021-03-30 ENCOUNTER — Other Ambulatory Visit: Payer: Self-pay | Admitting: Gastroenterology

## 2021-04-07 ENCOUNTER — Other Ambulatory Visit: Payer: Self-pay

## 2021-04-07 ENCOUNTER — Encounter: Payer: Self-pay | Admitting: Family Medicine

## 2021-04-07 ENCOUNTER — Ambulatory Visit (INDEPENDENT_AMBULATORY_CARE_PROVIDER_SITE_OTHER): Payer: Medicare Other | Admitting: Family Medicine

## 2021-04-07 VITALS — BP 132/66 | HR 69 | Temp 97.7°F | Ht 63.0 in | Wt 146.0 lb

## 2021-04-07 DIAGNOSIS — K219 Gastro-esophageal reflux disease without esophagitis: Secondary | ICD-10-CM | POA: Diagnosis not present

## 2021-04-07 DIAGNOSIS — K7031 Alcoholic cirrhosis of liver with ascites: Secondary | ICD-10-CM

## 2021-04-07 DIAGNOSIS — B351 Tinea unguium: Secondary | ICD-10-CM | POA: Diagnosis not present

## 2021-04-07 DIAGNOSIS — I10 Essential (primary) hypertension: Secondary | ICD-10-CM | POA: Diagnosis not present

## 2021-04-07 DIAGNOSIS — M545 Low back pain, unspecified: Secondary | ICD-10-CM

## 2021-04-07 DIAGNOSIS — I25118 Atherosclerotic heart disease of native coronary artery with other forms of angina pectoris: Secondary | ICD-10-CM | POA: Diagnosis not present

## 2021-04-07 DIAGNOSIS — G8929 Other chronic pain: Secondary | ICD-10-CM | POA: Diagnosis not present

## 2021-04-07 DIAGNOSIS — I4891 Unspecified atrial fibrillation: Secondary | ICD-10-CM | POA: Diagnosis not present
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
## 2021-04-08 ENCOUNTER — Ambulatory Visit: Payer: Medicare Other | Admitting: Family Medicine

## 2021-04-08 ENCOUNTER — Other Ambulatory Visit: Payer: Medicare Other

## 2021-04-08 ENCOUNTER — Ambulatory Visit (INDEPENDENT_AMBULATORY_CARE_PROVIDER_SITE_OTHER): Payer: Medicare Other

## 2021-04-08 DIAGNOSIS — M545 Low back pain, unspecified: Secondary | ICD-10-CM

## 2021-04-08 DIAGNOSIS — G8929 Other chronic pain: Secondary | ICD-10-CM | POA: Diagnosis not present

## 2021-04-08 DIAGNOSIS — M4854XA Collapsed vertebra, not elsewhere classified, thoracic region, initial encounter for fracture: Secondary | ICD-10-CM | POA: Diagnosis not present

## 2021-04-08 DIAGNOSIS — M439 Deforming dorsopathy, unspecified: Secondary | ICD-10-CM | POA: Diagnosis not present

## 2021-04-08 DIAGNOSIS — M47814 Spondylosis without myelopathy or radiculopathy, thoracic region: Secondary | ICD-10-CM | POA: Diagnosis not present

## 2021-04-08 IMAGING — DX DG LUMBAR SPINE COMPLETE 4+V
4 series · 4 of 4 positions shown · non-contrast
Comparison: CT [DATE]

CLINICAL DATA: Chronic low back pain.

EXAM:
LUMBAR SPINE - COMPLETE 4+ VIEW

[lumbar spine ap]
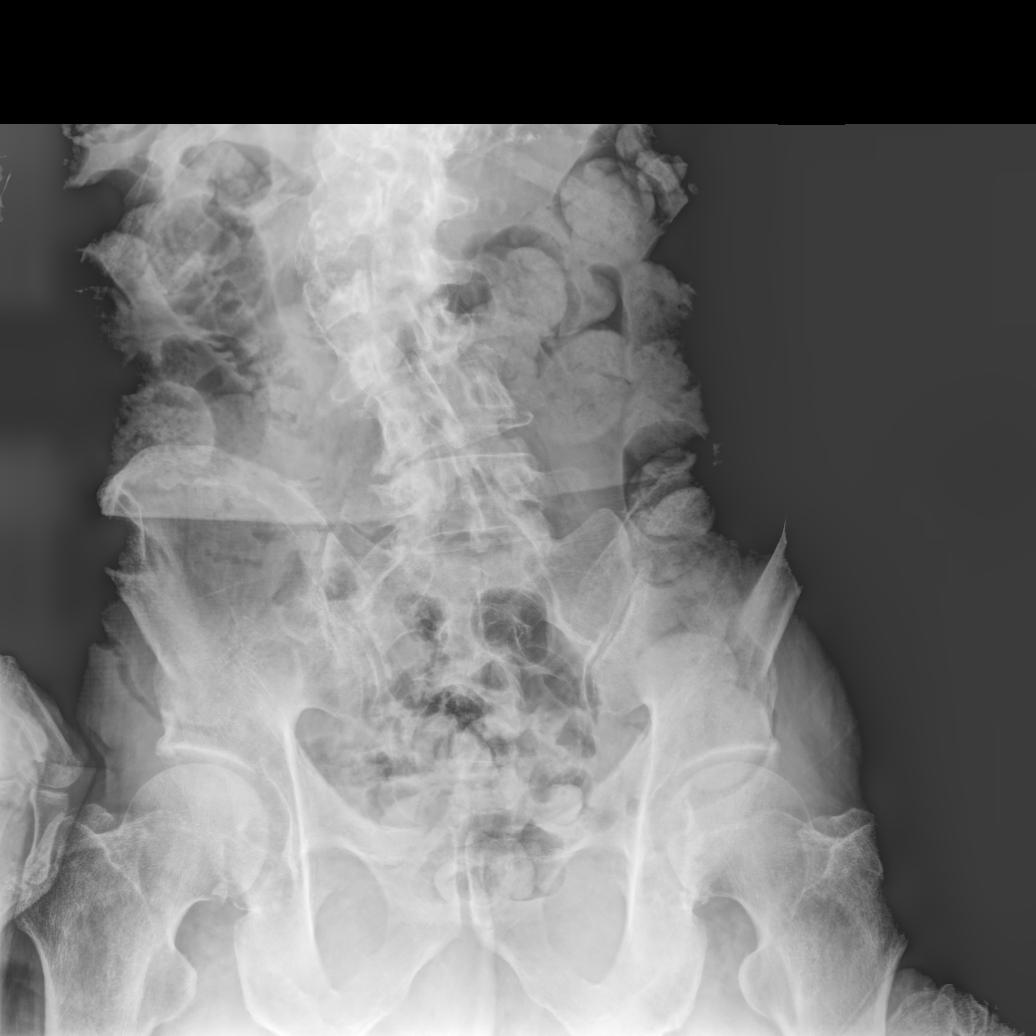

[lumbar spine lmo (1 of 2)]
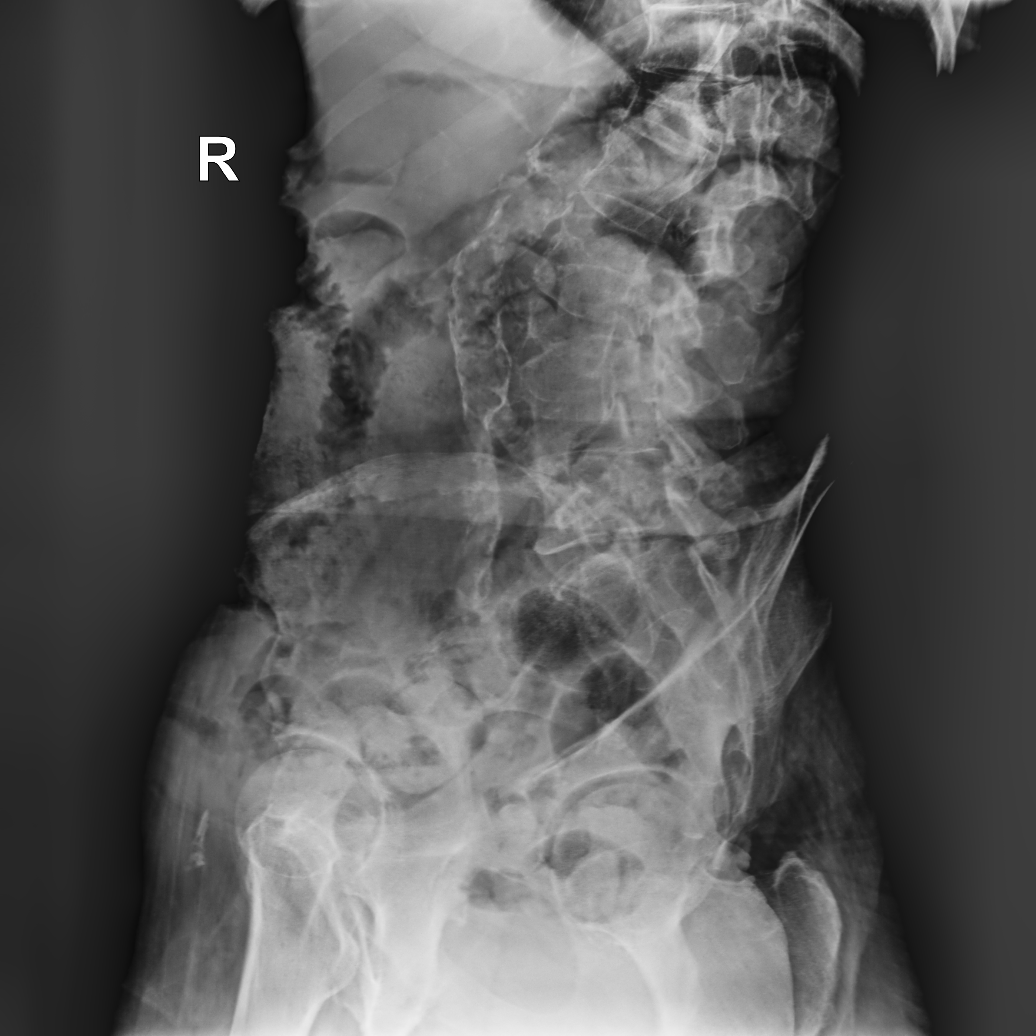

[lumbar spine lmo (2 of 2)]
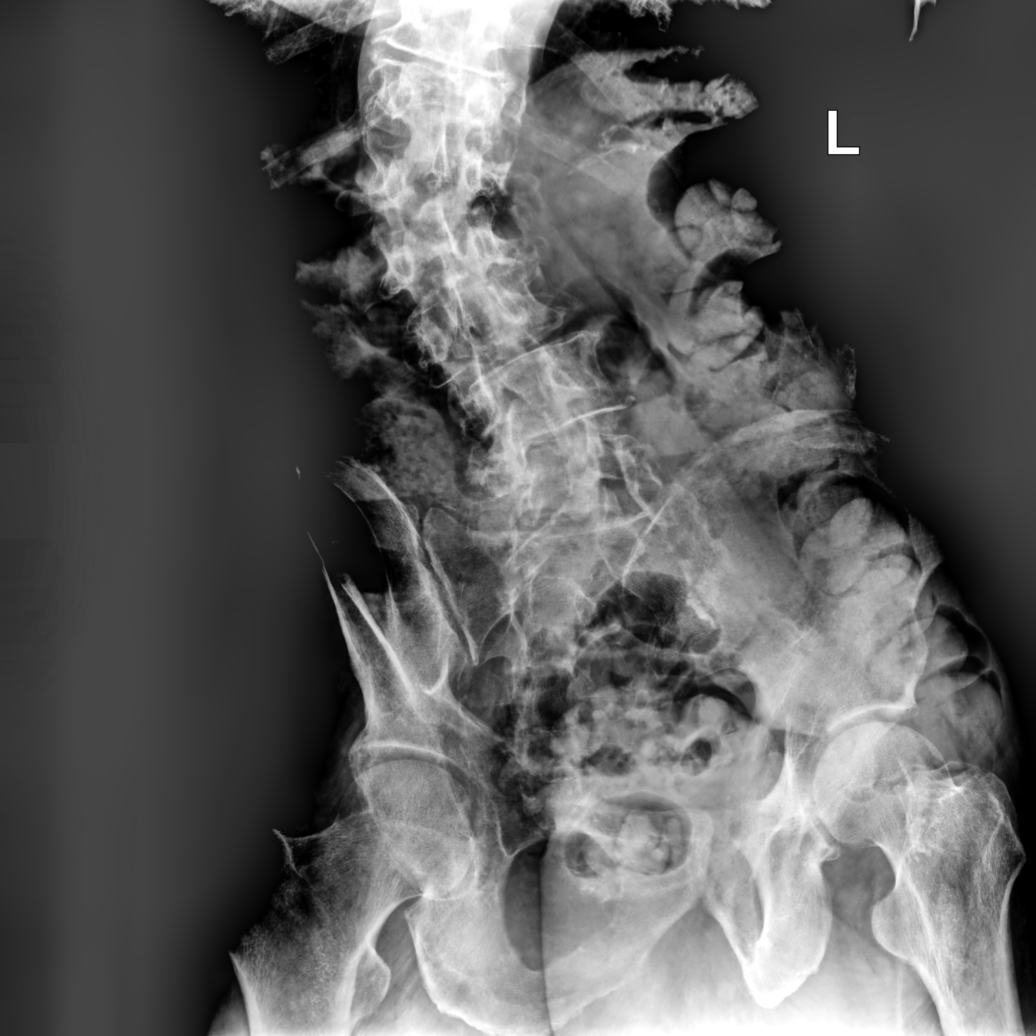

[lumbar spine lat]
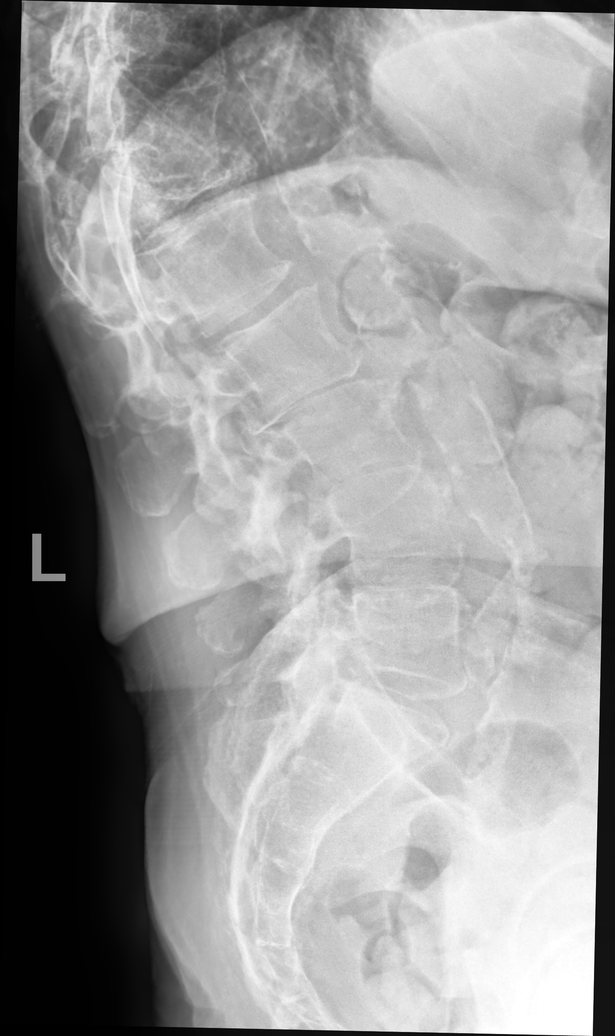

[4 of 4 positions shown; findings below may reference images not displayed]

FINDINGS: Slight interval worsening of curvature of the thoracolumbar spine
convex right with apex of curvature over the approximate L1 level.
There is mild to moderate spondylosis throughout the lumbar spine to
include facet arthropathy. Mild disc disease at the L2-3 and L3-4
levels. No compression fracture of the lumbar spine. Again noted is
patient's moderate T12 compression fracture. This is new since [I9],
although age indeterminate.
IMPRESSION: 1. Moderate T12 compression fracture new since [I9], but age
indeterminate.
2. Mild to moderate spondylosis of the lumbar spine with mild disc
disease at the L2-3 and L3-4 levels.
3. Slight interval worsening curvature of the thoracolumbar spine
convex right.

## 2021-04-08 IMAGING — DX DG THORACIC SPINE 3V
2 series · 2 of 2 positions shown · non-contrast
Comparison: Chest x-ray [DATE], CT [DATE]

CLINICAL DATA: Chronic mid back pain.

EXAM:
THORACIC SPINE - 3 VIEWS

[thoracic spine ap]
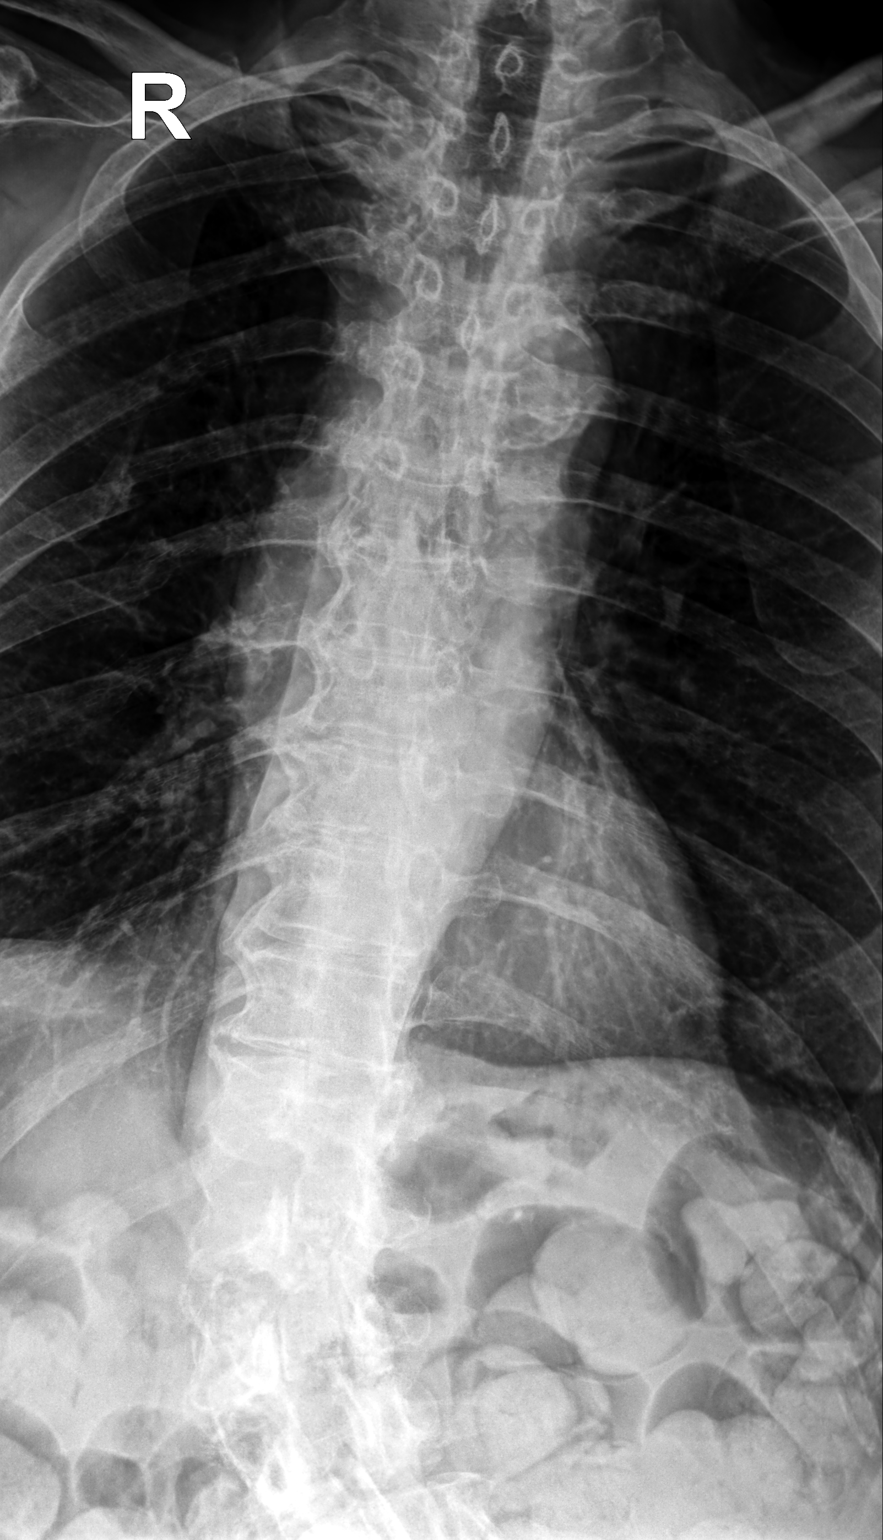

[thoracic spine lat]
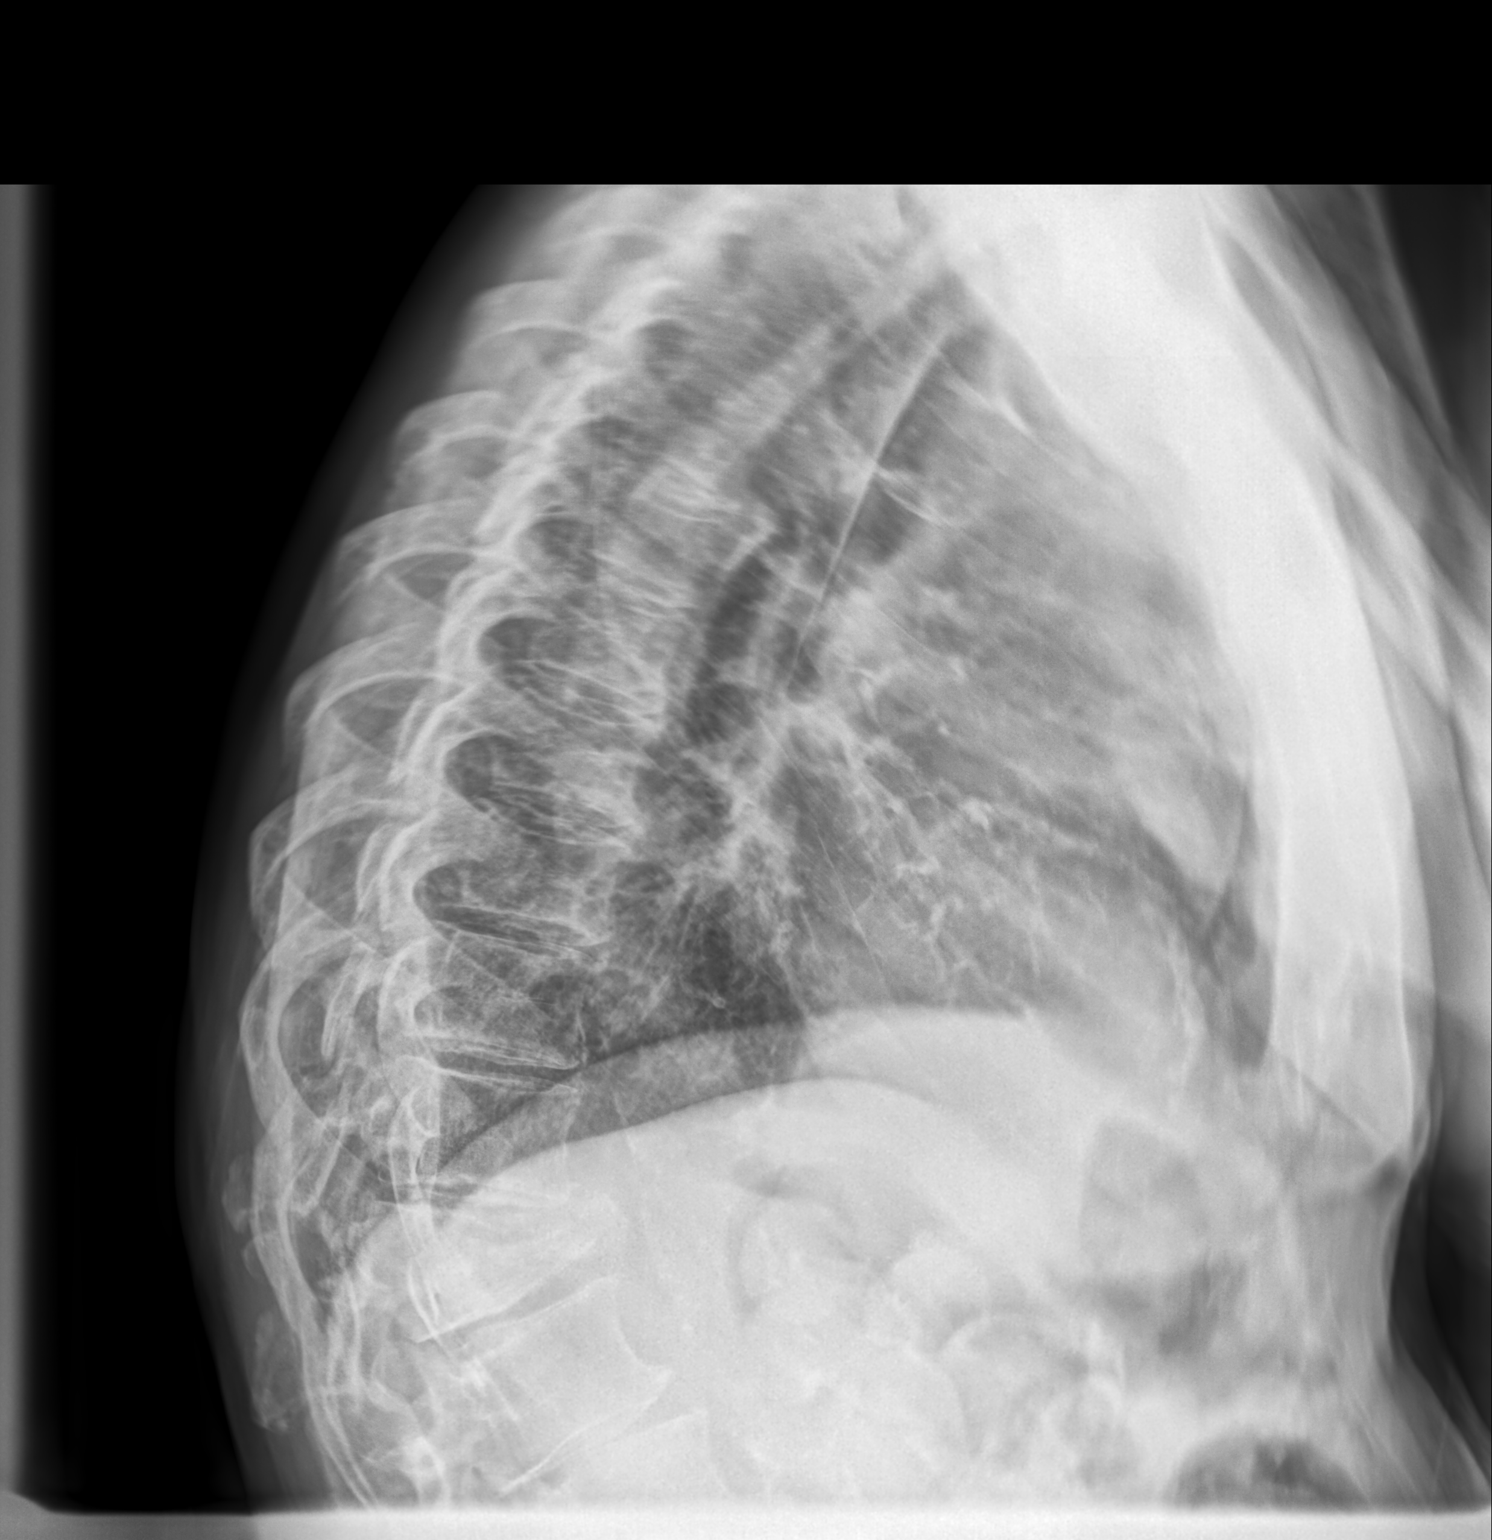

[2 of 2 positions shown; findings below may reference images not displayed]

FINDINGS: Mild curvature of the thoracolumbar spine convex right unchanged.
There is motion artifact on the lateral image. There is mild
spondylosis of the thoracic spine. There is a moderate compression
fracture in the region of the thoracolumbar junction likely
involving T12, age indeterminate, but new since [26].
IMPRESSION: 1. Moderate compression fracture in the region of the thoracolumbar
junction likely involving T12, age indeterminate, but new since
2. Mild spondylosis of the thoracic spine. Mild curvature convex
right unchanged.

## 2021-04-11 ENCOUNTER — Encounter: Payer: Self-pay | Admitting: Family Medicine

## 2021-04-11 DIAGNOSIS — S22080A Wedge compression fracture of T11-T12 vertebra, initial encounter for closed fracture: Secondary | ICD-10-CM | POA: Insufficient documentation

## 2021-04-11 DIAGNOSIS — M47816 Spondylosis without myelopathy or radiculopathy, lumbar region: Secondary | ICD-10-CM | POA: Insufficient documentation
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
## 2021-04-12 ENCOUNTER — Encounter: Payer: Self-pay | Admitting: Cardiology

## 2021-04-12 ENCOUNTER — Other Ambulatory Visit: Payer: Self-pay

## 2021-04-12 ENCOUNTER — Ambulatory Visit (INDEPENDENT_AMBULATORY_CARE_PROVIDER_SITE_OTHER): Payer: Medicare Other | Admitting: Cardiology

## 2021-04-12 VITALS — BP 134/70 | HR 60 | Ht 65.0 in | Wt 145.0 lb

## 2021-04-12 DIAGNOSIS — I350 Nonrheumatic aortic (valve) stenosis: Secondary | ICD-10-CM | POA: Diagnosis not present

## 2021-04-12 DIAGNOSIS — I77819 Aortic ectasia, unspecified site: Secondary | ICD-10-CM

## 2021-04-12 DIAGNOSIS — I25119 Atherosclerotic heart disease of native coronary artery with unspecified angina pectoris: Secondary | ICD-10-CM | POA: Diagnosis not present

## 2021-04-12 DIAGNOSIS — I480 Paroxysmal atrial fibrillation: Secondary | ICD-10-CM

## 2021-04-12 DIAGNOSIS — K7460 Unspecified cirrhosis of liver: Secondary | ICD-10-CM

## 2021-04-12 DIAGNOSIS — I493 Ventricular premature depolarization: Secondary | ICD-10-CM

## 2021-04-12 NOTE — Patient Instructions (Signed)
Medication Instructions:  Your physician recommends that you continue on your current medications as directed. Please refer to the Current Medication list given to you today.  *If you need a refill on your cardiac medications before your next appointment, please call your pharmacy*  Follow-Up: At [facility], you and your health needs are our priority.  As part of our continuing mission to provide you with exceptional heart care, we have created designated Provider Care Teams.  These Care Teams include your primary Cardiologist (physician) and Advanced Practice Providers (APPs -  Physician Assistants and Nurse Practitioners) who all work together to provide you with the care you need, when you need it.  We recommend signing up for the patient portal called "MyChart".  Sign up information is provided on this After Visit Summary.  MyChart is used to connect with patients for Virtual Visits (Telemedicine).  Patients are able to view lab/test results, encounter notes, upcoming appointments, etc.  Non-urgent messages can be sent to your provider as well.   To learn more about what you can do with MyChart, go to [email or web address].    Your next appointment:   6 month(s)  The format for your next appointment:   In Person  Provider:   Epifanio Lesches, MD   Other Instructions You have been referred to: Gastroenterology

## 2021-04-13 ENCOUNTER — Ambulatory Visit (INDEPENDENT_AMBULATORY_CARE_PROVIDER_SITE_OTHER): Payer: Medicare Other | Admitting: Family Medicine

## 2021-04-13 ENCOUNTER — Other Ambulatory Visit: Payer: Self-pay

## 2021-04-13 ENCOUNTER — Encounter: Payer: Self-pay | Admitting: Family Medicine

## 2021-04-13 VITALS — BP 138/89 | HR 35 | Temp 97.3°F | Resp 16 | Wt 146.6 lb

## 2021-04-13 DIAGNOSIS — M419 Scoliosis, unspecified: Secondary | ICD-10-CM

## 2021-04-13 DIAGNOSIS — M47816 Spondylosis without myelopathy or radiculopathy, lumbar region: Secondary | ICD-10-CM | POA: Diagnosis not present
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
## 2021-04-19 ENCOUNTER — Encounter: Payer: Self-pay | Admitting: Podiatry

## 2021-04-19 ENCOUNTER — Ambulatory Visit (INDEPENDENT_AMBULATORY_CARE_PROVIDER_SITE_OTHER): Payer: Medicare Other | Admitting: Podiatry

## 2021-04-19 ENCOUNTER — Other Ambulatory Visit: Payer: Self-pay

## 2021-04-19 DIAGNOSIS — G6289 Other specified polyneuropathies: Secondary | ICD-10-CM

## 2021-04-19 DIAGNOSIS — M79674 Pain in right toe(s): Secondary | ICD-10-CM

## 2021-04-19 DIAGNOSIS — M79675 Pain in left toe(s): Secondary | ICD-10-CM

## 2021-04-19 DIAGNOSIS — B351 Tinea unguium: Secondary | ICD-10-CM | POA: Diagnosis not present

## 2021-04-19 NOTE — Progress Notes (Signed)
  Subjective:  Patient ID: Billy Stone, male    DOB: [birth year],  MRN: [id number]  Chief Complaint  Patient presents with   Nail Problem     np-onychomycosis-req a tue am-dr. Jeannett Senior rudd refer    69 y.o. male presents with the above complaint. History confirmed with patient.  Has burning pain tingling pain and numbness in his feet.  He has thickened elongated yellowed brown toenails with pain  Objective:  Physical Exam: warm, good capillary refill, no trophic changes or ulcerative lesions, normal DP and PT pulses, and abnormal sensory exam. Left Foot: dystrophic yellowed discolored nail plates with subungual debris Right Foot: dystrophic yellowed discolored nail plates with subungual debris  Assessment:   1. Other polyneuropathy   2. Pain due to onychomycosis of toenails of both feet      Plan:  Patient was evaluated and treated and all questions answered.  He has multiple risk factors for peripheral neuropathy including alcohol dependence B12 deficiency and folate deficiency.  I encouraged him discuss with his PCP regarding supplementation and symptomatic relief using gabapentin or Lyrica as this is not something that I primarily treat.  Discussed the etiology and treatment options for the condition in detail with the patient. Educated patient on the topical and oral treatment options for mycotic nails. Recommended debridement of the nails today. Sharp and mechanical debridement performed of all painful and mycotic nails today. Nails debrided in length and thickness using a nail nipper to level of comfort. Discussed treatment options including appropriate shoe gear. Follow up as needed for painful nails.    Return in about 3 months (around 07/20/2021) for RFC.

## 2021-04-21 DIAGNOSIS — M8008XA Age-related osteoporosis with current pathological fracture, vertebra(e), initial encounter for fracture: Secondary | ICD-10-CM | POA: Diagnosis not present

## 2021-05-07 ENCOUNTER — Telehealth: Payer: Self-pay

## 2021-05-07 NOTE — Telephone Encounter (Signed)
Unable to reach pt left voicemail to call and schedule AWV.

## 2021-05-19 DIAGNOSIS — M8008XA Age-related osteoporosis with current pathological fracture, vertebra(e), initial encounter for fracture: Secondary | ICD-10-CM | POA: Diagnosis not present

## 2021-06-28 DIAGNOSIS — M8008XA Age-related osteoporosis with current pathological fracture, vertebra(e), initial encounter for fracture: Secondary | ICD-10-CM | POA: Diagnosis not present

## 2021-07-07 ENCOUNTER — Other Ambulatory Visit: Payer: Self-pay

## 2021-07-07 ENCOUNTER — Ambulatory Visit (INDEPENDENT_AMBULATORY_CARE_PROVIDER_SITE_OTHER): Payer: Medicare Other | Admitting: Family Medicine

## 2021-07-07 ENCOUNTER — Other Ambulatory Visit: Payer: Self-pay | Admitting: Cardiology

## 2021-07-07 ENCOUNTER — Encounter: Payer: Self-pay | Admitting: Family Medicine

## 2021-07-07 VITALS — BP 130/62 | HR 57 | Temp 97.8°F | Ht 65.0 in | Wt 139.0 lb

## 2021-07-07 DIAGNOSIS — K7031 Alcoholic cirrhosis of liver with ascites: Secondary | ICD-10-CM

## 2021-07-07 DIAGNOSIS — Z1211 Encounter for screening for malignant neoplasm of colon: Secondary | ICD-10-CM | POA: Diagnosis not present

## 2021-07-07 DIAGNOSIS — I25118 Atherosclerotic heart disease of native coronary artery with other forms of angina pectoris: Secondary | ICD-10-CM | POA: Diagnosis not present

## 2021-07-07 DIAGNOSIS — S22080G Wedge compression fracture of T11-T12 vertebra, subsequent encounter for fracture with delayed healing: Secondary | ICD-10-CM | POA: Diagnosis not present

## 2021-07-07 DIAGNOSIS — I4891 Unspecified atrial fibrillation: Secondary | ICD-10-CM | POA: Diagnosis not present

## 2021-07-07 DIAGNOSIS — I10 Essential (primary) hypertension: Secondary | ICD-10-CM | POA: Diagnosis not present

## 2021-07-07 DIAGNOSIS — Z23 Encounter for immunization: Secondary | ICD-10-CM
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
## 2021-07-15 ENCOUNTER — Encounter: Payer: Self-pay | Admitting: Gastroenterology

## 2021-07-25 ENCOUNTER — Other Ambulatory Visit: Payer: Self-pay

## 2021-07-25 ENCOUNTER — Encounter: Payer: Self-pay | Admitting: Podiatry

## 2021-07-25 ENCOUNTER — Ambulatory Visit (INDEPENDENT_AMBULATORY_CARE_PROVIDER_SITE_OTHER): Payer: Medicare Other | Admitting: Podiatry

## 2021-07-25 DIAGNOSIS — B351 Tinea unguium: Secondary | ICD-10-CM

## 2021-07-25 DIAGNOSIS — M79675 Pain in left toe(s): Secondary | ICD-10-CM | POA: Diagnosis not present

## 2021-07-25 DIAGNOSIS — M79674 Pain in right toe(s): Secondary | ICD-10-CM

## 2021-07-25 DIAGNOSIS — G6289 Other specified polyneuropathies: Secondary | ICD-10-CM

## 2021-07-25 NOTE — Progress Notes (Signed)
This patient presents to the office with chief complaint of long thick painful nails.  Patient says the nails are painful walking and wearing shoes.  This patient is unable to self treat.  This patient is unable to trim his nails since she is unable to reach heis nails.  She presents to the office for preventative foot care services.  General Appearance  Alert, conversant and in no acute stress.  Vascular  Dorsalis pedis and posterior tibial  pulses are palpable  bilaterally.  Capillary return is within normal limits  bilaterally. Temperature is within normal limits  bilaterally.  Neurologic  Senn-Weinstein monofilament wire test diminished  bilaterally. Muscle power within normal limits bilaterally.  Nails Thick disfigured discolored nails with subungual debris  from hallux to fifth toes bilaterally. No evidence of bacterial infection or drainage bilaterally.  Orthopedic  No limitations of motion  feet .  No crepitus or effusions noted.  No bony pathology or digital deformities noted.  HAV  B/L.  Skin  normotropic skin with no porokeratosis noted bilaterally.  No signs of infections or ulcers noted.     Onychomycosis  Nails  B/L.  Pain in right toes  Pain in left toes  Debridement of nails both feet followed trimming the nails with dremel tool.    RTC 3 months.   Helane Gunther DPM

## 2021-08-03 ENCOUNTER — Telehealth: Payer: Self-pay | Admitting: Family Medicine

## 2021-08-03 NOTE — Telephone Encounter (Signed)
N/A, unable to leave a message for patient to call back to schedule Medicare Annual Wellness Visit   No hx of AWV eligible as of 01/26/21  Please schedule at anytime with [facility] if patient calls the office back.    45 Minutes appointment   Any questions, please call me at [phone]

## 2021-08-08 ENCOUNTER — Other Ambulatory Visit: Payer: Self-pay | Admitting: Cardiology

## 2021-08-30 ENCOUNTER — Other Ambulatory Visit (INDEPENDENT_AMBULATORY_CARE_PROVIDER_SITE_OTHER): Payer: Medicare Other

## 2021-08-30 ENCOUNTER — Encounter: Payer: Self-pay | Admitting: Gastroenterology

## 2021-08-30 ENCOUNTER — Ambulatory Visit (INDEPENDENT_AMBULATORY_CARE_PROVIDER_SITE_OTHER): Payer: Medicare Other | Admitting: Gastroenterology

## 2021-08-30 VITALS — BP 150/80 | HR 55 | Ht 65.0 in | Wt 143.0 lb

## 2021-08-30 DIAGNOSIS — K7030 Alcoholic cirrhosis of liver without ascites: Secondary | ICD-10-CM | POA: Diagnosis not present

## 2021-08-30 DIAGNOSIS — K5900 Constipation, unspecified: Secondary | ICD-10-CM

## 2021-08-30 DIAGNOSIS — K7460 Unspecified cirrhosis of liver: Secondary | ICD-10-CM

## 2021-08-30 DIAGNOSIS — D649 Anemia, unspecified: Secondary | ICD-10-CM

## 2021-08-30 LAB — CBC WITH DIFFERENTIAL/PLATELET
Basophils Absolute: 0.1 10*3/uL (ref 0.0–0.1)
Basophils Relative: 0.7 % (ref 0.0–3.0)
Eosinophils Absolute: 0.6 10*3/uL (ref 0.0–0.7)
Eosinophils Relative: 6 % — ABNORMAL HIGH (ref 0.0–5.0)
HCT: 41.7 % (ref 39.0–52.0)
Hemoglobin: 13.6 g/dL (ref 13.0–17.0)
Lymphocytes Relative: 36 % (ref 12.0–46.0)
Lymphs Abs: 3.6 10*3/uL (ref 0.7–4.0)
MCHC: 32.7 g/dL (ref 30.0–36.0)
MCV: 93.4 fl (ref 78.0–100.0)
Monocytes Absolute: 0.9 10*3/uL (ref 0.1–1.0)
Monocytes Relative: 9.2 % (ref 3.0–12.0)
Neutro Abs: 4.8 10*3/uL (ref 1.4–7.7)
Neutrophils Relative %: 48.1 % (ref 43.0–77.0)
Platelets: 262 10*3/uL (ref 150.0–400.0)
RBC: 4.47 Mil/uL (ref 4.22–5.81)
RDW: 15.7 % — ABNORMAL HIGH (ref 11.5–15.5)
WBC: 10 10*3/uL (ref 4.0–10.5)

## 2021-08-30 LAB — PROTIME-INR
INR: 1
Prothrombin Time: 10.3 s (ref 9.0–11.5)

## 2021-08-30 LAB — BASIC METABOLIC PANEL
BUN: 13 mg/dL (ref 6–23)
CO2: 28 mEq/L (ref 19–32)
Calcium: 9.7 mg/dL (ref 8.4–10.5)
Chloride: 99 mEq/L (ref 96–112)
Creatinine, Ser: 0.75 mg/dL (ref 0.40–1.50)
GFR: 89.98 mL/min (ref >60.00)
Glucose, Bld: 104 mg/dL — ABNORMAL HIGH (ref 70–99)
Potassium: 3.7 mEq/L (ref 3.5–5.1)
Sodium: 137 mEq/L (ref 135–145)

## 2021-08-30 LAB — IBC PANEL
Iron: 94 ug/dL (ref 42–165)
Saturation Ratios: 23.8 % (ref 20.0–50.0)
TIBC: 394.8 ug/dL (ref 250.0–450.0)
Transferrin: 282 mg/dL (ref 212.0–360.0)

## 2021-08-30 LAB — FERRITIN: Ferritin: 26.5 ng/mL (ref 22.0–322.0)

## 2021-08-30 LAB — HEPATIC FUNCTION PANEL
ALT: 12 U/L (ref 0–53)
AST: 23 U/L (ref 0–37)
Albumin: 3.9 g/dL (ref 3.5–5.2)
Alkaline Phosphatase: 142 U/L — ABNORMAL HIGH (ref 39–117)
Bilirubin, Direct: 0.3 mg/dL (ref 0.0–0.3)
Total Bilirubin: 0.9 mg/dL (ref 0.2–1.2)
Total Protein: 9 g/dL — ABNORMAL HIGH (ref 6.0–8.3)

## 2021-08-30 MED ORDER — POLYETHYLENE GLYCOL 3350 17 GM/SCOOP PO POWD: 1.0000 | Freq: Every day | ORAL | 3 refills | Status: DC
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
## 2021-08-30 NOTE — Patient Instructions (Signed)
If you are age 73 or older, your body mass index should be between 23-30. Your Body mass index is 23.8 kg/m. If this is out of the aforementioned range listed, please consider follow up with your Primary Care Provider.  If you are age 22 or younger, your body mass index should be between 19-25. Your Body mass index is 23.8 kg/m. If this is out of the aformentioned range listed, please consider follow up with your Primary Care Provider.   Your provider has requested that you go to the basement level for lab work before leaving today. Press "B" on the elevator. The lab is located at the first door on the left as you exit the elevator.   We have sent the following medications to your pharmacy for you to pick up at your convenience: Miralax 1 capful daily in 8 ounces of water or juice.    You have been scheduled for an abdominal ultrasound at [facility] (1st floor of hospital) on 09/09/21 at 10 am . Please arrive 15 minutes prior to your appointment for registration. Make certain not to have anything to eat or drink 6 hours prior to your appointment. Should you need to reschedule your appointment, please contact radiology at [phone]. This test typically takes about 30 minutes to perform.   Due to recent changes in healthcare laws, you may see the results of your imaging and laboratory studies on MyChart before your provider has had a chance to review them.  We understand that in some cases there may be results that are confusing or concerning to you. Not all laboratory results come back in the same time frame and the provider may be waiting for multiple results in order to interpret others.  Please give [place] 48 hours in order for your provider to thoroughly review all the results before contacting the office for clarification of your results.    The  GI providers would like to encourage you to use [facility] to communicate with providers for non-urgent requests or questions.  Due to long  hold times on the telephone, sending your provider a message by [facility] may be a faster and more efficient way to get a response.  Please allow 48 business hours for a response.  Please remember that this is for non-urgent requests.   It was a pleasure to see you today!  Thank you for trusting me with your gastrointestinal care!    Scott E.Tomasa Rand , MD

## 2021-09-02 ENCOUNTER — Encounter: Payer: Self-pay | Admitting: Gastroenterology

## 2021-09-05 NOTE — Progress Notes (Signed)
Maya, can you please contact Billy Stone and let him know that his labs looked good.  No further labs needed.

## 2021-09-09 ENCOUNTER — Other Ambulatory Visit: Payer: Self-pay

## 2021-09-09 ENCOUNTER — Ambulatory Visit (HOSPITAL_COMMUNITY)
Admission: RE | Admit: 2021-09-09 | Discharge: 2021-09-09 | Disposition: A | Discharge status: Home / Self Care | Payer: Medicare Other | Source: Ambulatory Visit | Attending: Gastroenterology | Admitting: Gastroenterology

## 2021-09-09 DIAGNOSIS — K8020 Calculus of gallbladder without cholecystitis without obstruction: Secondary | ICD-10-CM | POA: Diagnosis not present

## 2021-09-09 DIAGNOSIS — K7030 Alcoholic cirrhosis of liver without ascites: Secondary | ICD-10-CM | POA: Diagnosis not present

## 2021-09-09 DIAGNOSIS — K7460 Unspecified cirrhosis of liver: Secondary | ICD-10-CM | POA: Diagnosis not present

## 2021-09-09 IMAGING — US US ABDOMEN LIMITED
1 series · 15 of 25 positions shown · non-contrast
Comparison: [DATE]

CLINICAL DATA: Alcoholic cirrhosis

EXAM:
ULTRASOUND ABDOMEN LIMITED RIGHT UPPER QUADRANT

[Series 1: us abdomen limited ruq mc & wl · 15 of 94 slices shown]
[im 1/94]
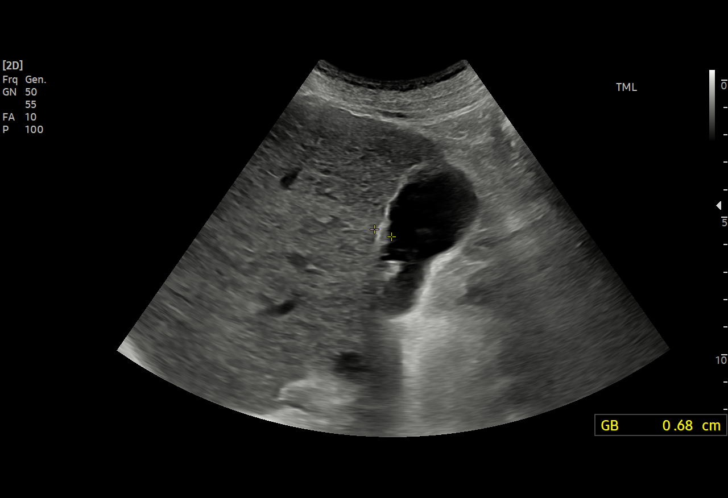
[im 8/94]
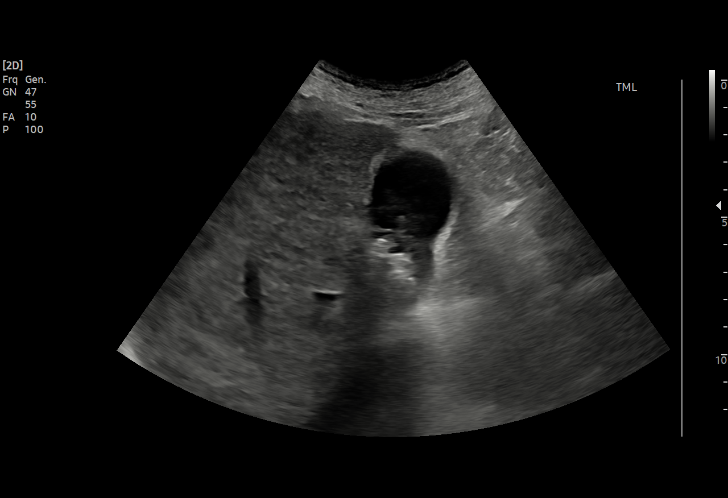
[im 16/94]
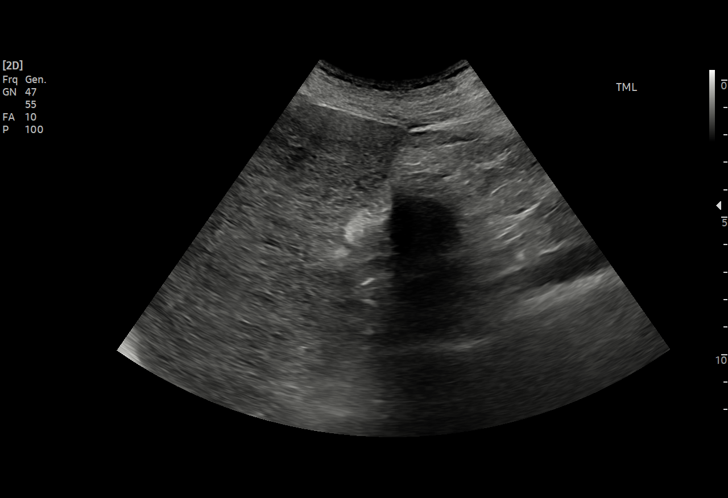
[im 20/94]
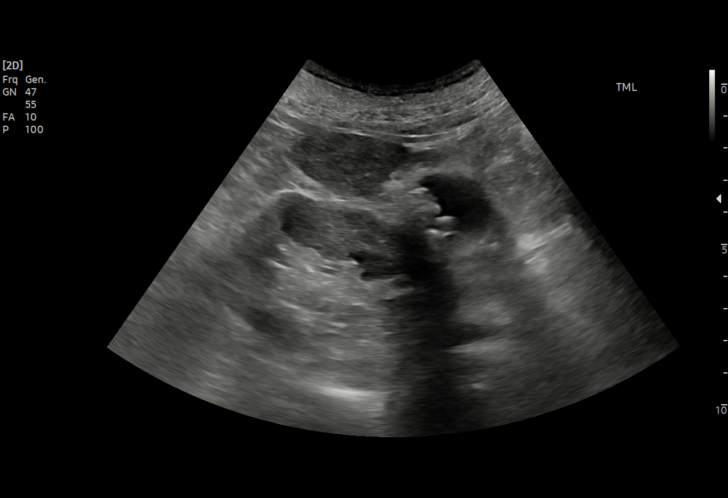
[im 28/94]
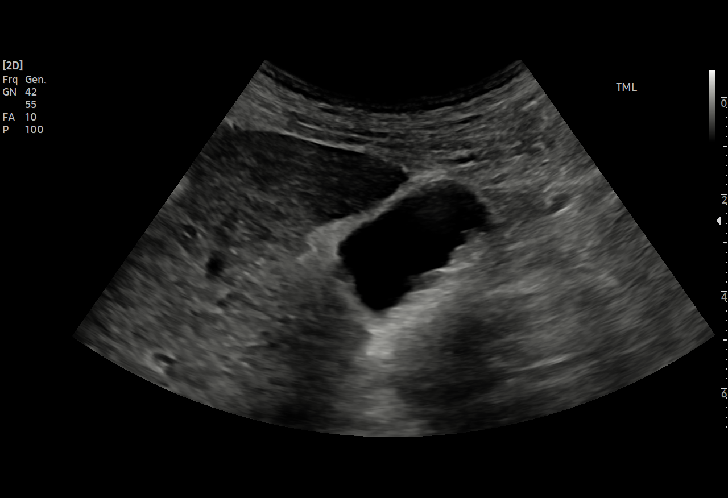
[im 35/94]
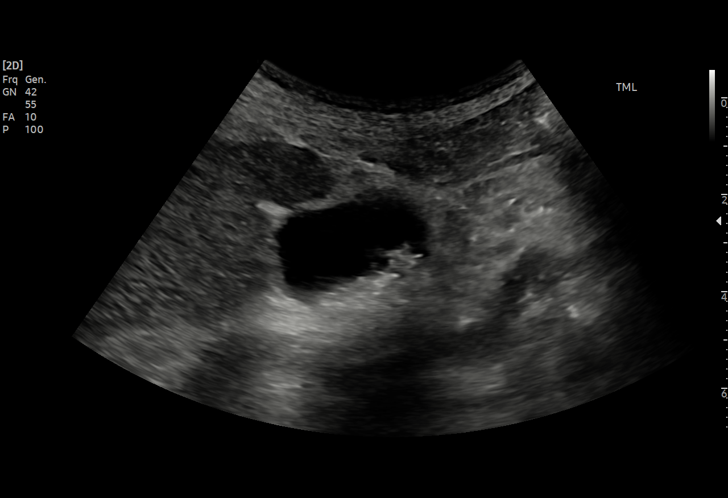
[im 39/94]
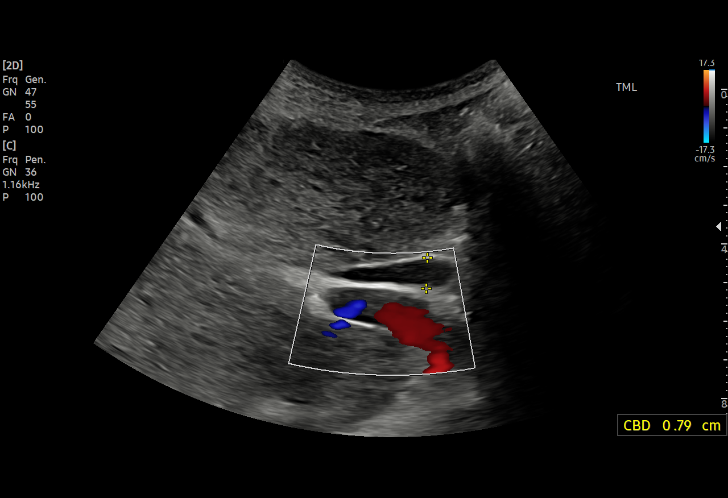
[im 47/94]
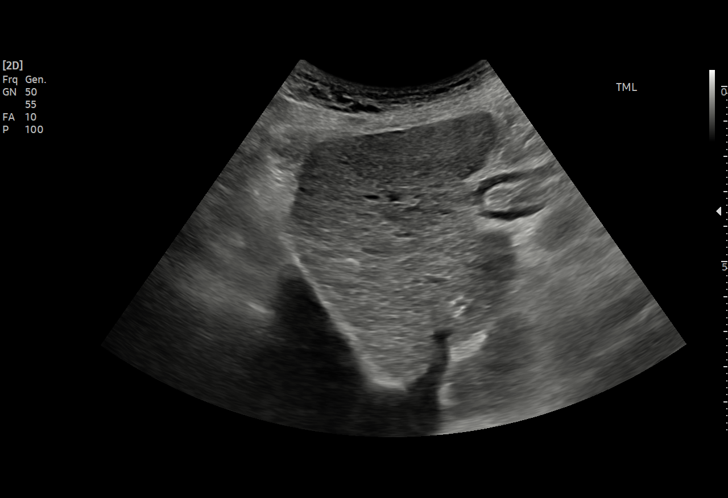
[im 55/94]
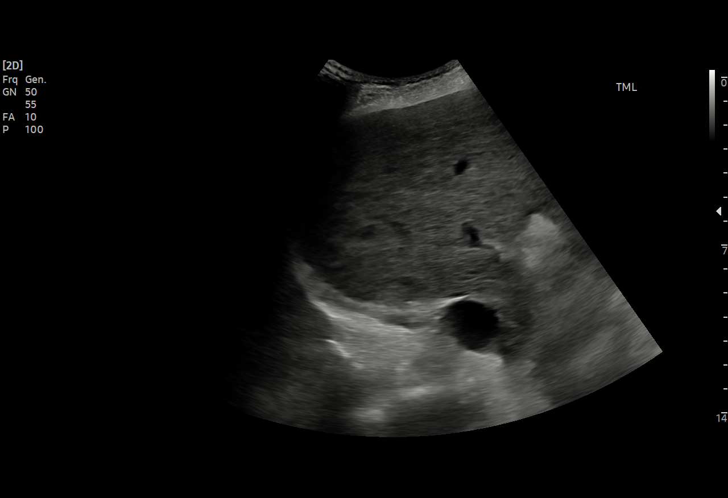
[im 59/94]
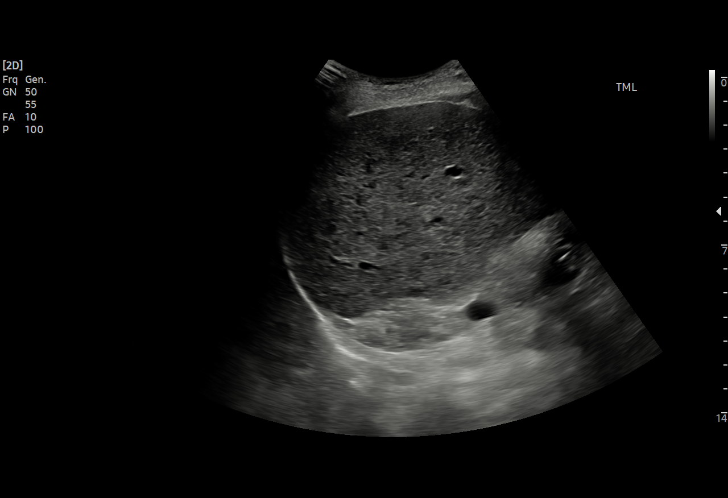
[im 66/94]
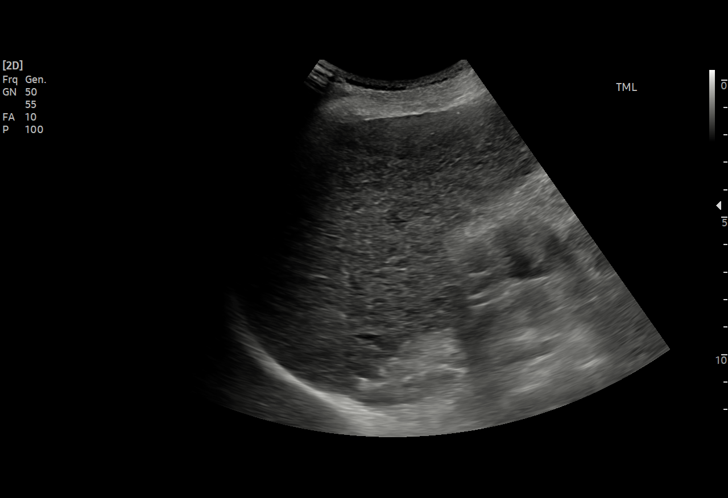
[im 74/94]
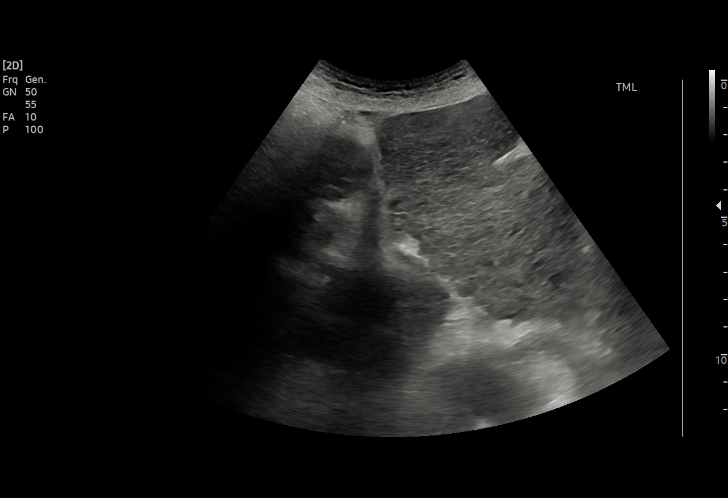
[im 78/94]
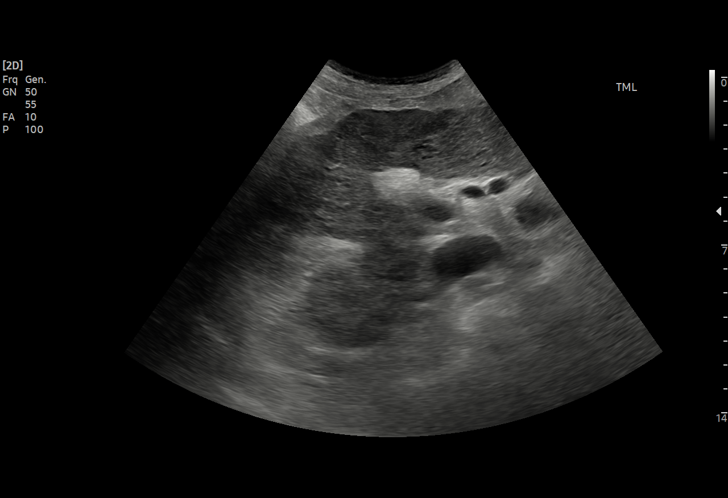
[im 86/94]
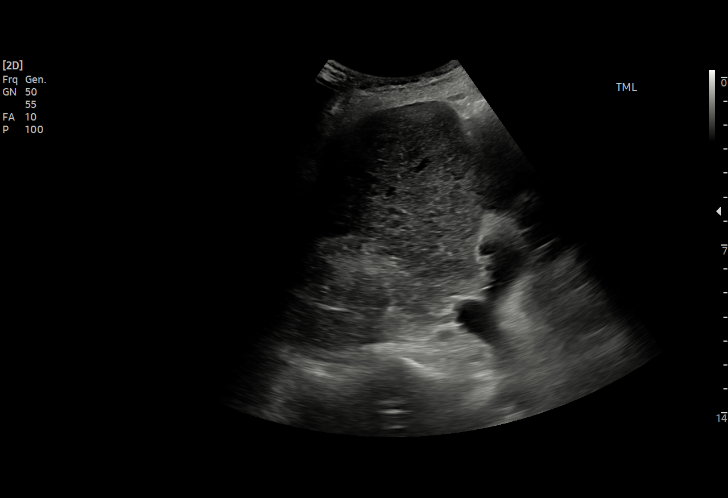
[im 94/94]
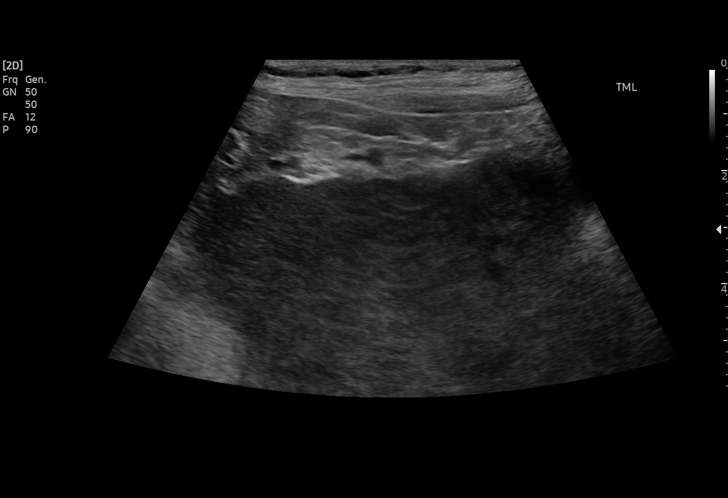

[15 of 25 positions shown; findings below may reference images not displayed]

FINDINGS: Gallbladder:

The gallbladder contains layering sludge and stones. The
gallbladder, however, is not distended, there is no gallbladder wall
thickening, and no pericholecystic fluid is identified. The
sonographic Murphy sign is reportedly negative.

Common bile duct:

Diameter: The extrahepatic bile duct is mildly dilated, measuring 8
mm in diameter proximally, progressive since prior examination. The
distal duct is obscured by overlying bowel gas.

Liver:

The liver contour is nodular and the hepatic echotexture is
diffusely coarsened in keeping with changes of cirrhosis. Liver size
is within normal limits. No focal intrahepatic masses are seen.
There is no intrahepatic biliary ductal dilation. Portal vein is
patent on color Doppler imaging with normal direction of blood flow
towards the liver.

Other: No ascites
IMPRESSION: Cholelithiasis without sonographic evidence of acute cholecystitis.

Progressive dilation of the extrahepatic bile duct. A distal
obstructing lesion is not excluded. Correlation with liver enzymes
is recommended. If abnormal, ERCP or MRCP examination may be helpful
to assess the distal common duct.

Cirrhosis.  No focal intrahepatic mass identified.

## 2021-09-13 NOTE — Progress Notes (Signed)
Billy Stone,  Please let Billy Stone know that his [place] did not show any ascites or concerning liver lesions, but did show a dilated common bile duct, which may indicate that there is something obstructing the bile duct.  This can be from benign things like gallstones or from more concerning etiologies such as cancer of the pancreas or bile duct.   I would like to get an MRCP which can provide a much more detailed view of this area.

## 2021-09-14 ENCOUNTER — Other Ambulatory Visit: Payer: Self-pay

## 2021-09-14 DIAGNOSIS — K7030 Alcoholic cirrhosis of liver without ascites: Secondary | ICD-10-CM

## 2021-09-14 DIAGNOSIS — K838 Other specified diseases of biliary tract: Secondary | ICD-10-CM

## 2021-09-20 ENCOUNTER — Ambulatory Visit (INDEPENDENT_AMBULATORY_CARE_PROVIDER_SITE_OTHER): Payer: Medicare Other

## 2021-09-20 DIAGNOSIS — Z0000 Encounter for general adult medical examination without abnormal findings: Secondary | ICD-10-CM | POA: Diagnosis not present

## 2021-09-20 NOTE — Patient Instructions (Signed)
Billy Stone , Thank you for taking time to come for your Medicare Wellness Visit. I appreciate your ongoing commitment to your health goals. Please review the following plan we discussed and let me know if I can assist you in the future.   Screening recommendations/referrals: Colonoscopy: patient declines  Recommended yearly ophthalmology/optometry visit for glaucoma screening and checkup Recommended yearly dental visit for hygiene and checkup  Vaccinations: Influenza vaccine: completed  Pneumococcal vaccine: completed  Tdap vaccine: 03/27/2016 Shingles vaccine: will consider     Advanced directives: none   Conditions/risks identified: none   Next appointment: 10/11/2021  1030am Dr. Veto Kemps   Preventive Care 73 Years and Older, Male Preventive care refers to lifestyle choices and visits with your health care provider that can promote health and wellness. What does preventive care include? A yearly physical exam. This is also called an annual well check. Dental exams once or twice a year. Routine eye exams. Ask your health care provider how often you should have your eyes checked. Personal lifestyle choices, including: Daily care of your teeth and gums. Regular physical activity. Eating a healthy diet. Avoiding tobacco and drug use. Limiting alcohol use. Practicing safe sex. Taking low doses of aspirin every day. Taking vitamin and mineral supplements as recommended by your health care provider. What happens during an annual well check? The services and screenings done by your health care provider during your annual well check will depend on your age, overall health, lifestyle risk factors, and family history of disease. Counseling  Your health care provider may ask you questions about your: Alcohol use. Tobacco use. Drug use. Emotional well-being. Home and relationship well-being. Sexual activity. Eating habits. History of falls. Memory and ability to understand  (cognition). Work and work Astronomer. Screening  You may have the following tests or measurements: Height, weight, and BMI. Blood pressure. Lipid and cholesterol levels. These may be checked every 5 years, or more frequently if you are over 3 years old. Skin check. Lung cancer screening. You may have this screening every year starting at age 60 if you have a 30-pack-year history of smoking and currently smoke or have quit within the past 15 years. Fecal occult blood test (FOBT) of the stool. You may have this test every year starting at age 79. Flexible sigmoidoscopy or colonoscopy. You may have a sigmoidoscopy every 5 years or a colonoscopy every 10 years starting at age 51. Prostate cancer screening. Recommendations will vary depending on your family history and other risks. Hepatitis C blood test. Hepatitis B blood test. Sexually transmitted disease (STD) testing. Diabetes screening. This is done by checking your blood sugar (glucose) after you have not eaten for a while (fasting). You may have this done every 1-3 years. Abdominal aortic aneurysm (AAA) screening. You may need this if you are a current or former smoker. Osteoporosis. You may be screened starting at age 66 if you are at high risk. Talk with your health care provider about your test results, treatment options, and if necessary, the need for more tests. Vaccines  Your health care provider may recommend certain vaccines, such as: Influenza vaccine. This is recommended every year. Tetanus, diphtheria, and acellular pertussis (Tdap, Td) vaccine. You may need a Td booster every 10 years. Zoster vaccine. You may need this after age 73. Pneumococcal 13-valent conjugate (PCV13) vaccine. One dose is recommended after age 32. Pneumococcal polysaccharide (PPSV23) vaccine. One dose is recommended after age 59. Talk to your health care provider about which screenings and vaccines  you need and how often you need them. This  information is not intended to replace advice given to you by your health care provider. Make sure you discuss any questions you have with your health care provider. Document Released: 09/10/2015 Document Revised: 05/03/2016 Document Reviewed: 06/15/2015 Elsevier Interactive Patient Education  2017 [employer] Prevention in the Home Falls can cause injuries. They can happen to people of all ages. There are many things you can do to make your home safe and to help prevent falls. What can I do on the outside of my home? Regularly fix the edges of walkways and driveways and fix any cracks. Remove anything that might make you trip as you walk through a door, such as a raised step or threshold. Trim any bushes or trees on the path to your home. Use bright outdoor lighting. Clear any walking paths of anything that might make someone trip, such as rocks or tools. Regularly check to see if handrails are loose or broken. Make sure that both sides of any steps have handrails. Any raised decks and porches should have guardrails on the edges. Have any leaves, snow, or ice cleared regularly. Use sand or salt on walking paths during winter. Clean up any spills in your garage right away. This includes oil or grease spills. What can I do in the bathroom? Use night lights. Install grab bars by the toilet and in the tub and shower. Do not use towel bars as grab bars. Use non-skid mats or decals in the tub or shower. If you need to sit down in the shower, use a plastic, non-slip stool. Keep the floor dry. Clean up any water that spills on the floor as soon as it happens. Remove soap buildup in the tub or shower regularly. Attach bath mats securely with double-sided non-slip rug tape. Do not have throw rugs and other things on the floor that can make you trip. What can I do in the bedroom? Use night lights. Make sure that you have a light by your bed that is easy to reach. Do not use any sheets or  blankets that are too big for your bed. They should not hang down onto the floor. Have a firm chair that has side arms. You can use this for support while you get dressed. Do not have throw rugs and other things on the floor that can make you trip. What can I do in the kitchen? Clean up any spills right away. Avoid walking on wet floors. Keep items that you use a lot in easy-to-reach places. If you need to reach something above you, use a strong step stool that has a grab bar. Keep electrical cords out of the way. Do not use floor polish or wax that makes floors slippery. If you must use wax, use non-skid floor wax. Do not have throw rugs and other things on the floor that can make you trip. What can I do with my stairs? Do not leave any items on the stairs. Make sure that there are handrails on both sides of the stairs and use them. Fix handrails that are broken or loose. Make sure that handrails are as long as the stairways. Check any carpeting to make sure that it is firmly attached to the stairs. Fix any carpet that is loose or worn. Avoid having throw rugs at the top or bottom of the stairs. If you do have throw rugs, attach them to the floor with carpet tape. Make sure  that you have a light switch at the top of the stairs and the bottom of the stairs. If you do not have them, ask someone to add them for you. What else can I do to help prevent falls? Wear shoes that: Do not have high heels. Have rubber bottoms. Are comfortable and fit you well. Are closed at the toe. Do not wear sandals. If you use a stepladder: Make sure that it is fully opened. Do not climb a closed stepladder. Make sure that both sides of the stepladder are locked into place. Ask someone to hold it for you, if possible. Clearly mark and make sure that you can see: Any grab bars or handrails. First and last steps. Where the edge of each step is. Use tools that help you move around (mobility aids) if they are  needed. These include: Canes. Walkers. Scooters. Crutches. Turn on the lights when you go into a dark area. Replace any light bulbs as soon as they burn out. Set up your furniture so you have a clear path. Avoid moving your furniture around. If any of your floors are uneven, fix them. If there are any pets around you, be aware of where they are. Review your medicines with your doctor. Some medicines can make you feel dizzy. This can increase your chance of falling. Ask your doctor what other things that you can do to help prevent falls. This information is not intended to replace advice given to you by your health care provider. Make sure you discuss any questions you have with your health care provider. Document Released: 06/10/2009 Document Revised: 01/20/2016 Document Reviewed: 09/18/2014 Elsevier Interactive Patient Education  2017 [employer].
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
## 2021-09-23 ENCOUNTER — Telehealth: Payer: Self-pay

## 2021-09-23 NOTE — Telephone Encounter (Signed)
Spoke to patient VIA phone,  he reports that the catheters he is using that wer given to him by [facility] in [place] are not working for [employer].  He tried using one of them and was unable to insert it all the way so he pushed a little bit and it started bleeding, then a little later tried a differrent one and was able to get some urine out.   He is concerned that his prostrate may be enlarged more and couldn't remember the name or number of his doctor.  Looked up the number and gave him on for [facility] to call. Did advise him that if he continues to have issues and pain to go to the ER to be checked out.  He is in agreeable to this plan. Dm/cma

## 2021-09-29 ENCOUNTER — Ambulatory Visit (HOSPITAL_COMMUNITY)
Admission: RE | Admit: 2021-09-29 | Discharge: 2021-09-29 | Disposition: A | Discharge status: Home / Self Care | Payer: Medicare Other | Source: Ambulatory Visit | Attending: Gastroenterology | Admitting: Gastroenterology

## 2021-09-29 ENCOUNTER — Other Ambulatory Visit: Payer: Self-pay | Admitting: Gastroenterology

## 2021-09-29 ENCOUNTER — Other Ambulatory Visit: Payer: Self-pay

## 2021-09-29 DIAGNOSIS — K838 Other specified diseases of biliary tract: Secondary | ICD-10-CM

## 2021-09-29 DIAGNOSIS — K7030 Alcoholic cirrhosis of liver without ascites: Secondary | ICD-10-CM | POA: Diagnosis not present

## 2021-09-29 DIAGNOSIS — K8689 Other specified diseases of pancreas: Secondary | ICD-10-CM | POA: Diagnosis not present

## 2021-09-29 DIAGNOSIS — K7689 Other specified diseases of liver: Secondary | ICD-10-CM | POA: Diagnosis not present

## 2021-09-29 DIAGNOSIS — K8020 Calculus of gallbladder without cholecystitis without obstruction: Secondary | ICD-10-CM | POA: Diagnosis not present

## 2021-09-29 DIAGNOSIS — K7460 Unspecified cirrhosis of liver: Secondary | ICD-10-CM | POA: Diagnosis not present

## 2021-09-29 IMAGING — MR MR ABDOMEN WO/W CM MRCP
17 of 20 series · 40 of 48 positions shown · IV contrast (gadavist)
Comparison: Abdominal ultrasound [DATE]

CLINICAL DATA: Dilated common bile duct, cirrhosis

EXAM:
MRI ABDOMEN WITHOUT AND WITH CONTRAST (INCLUDING MRCP)
TECHNIQUE: Multiplanar multisequence MR imaging of the abdomen was performed
both before and after the administration of intravenous contrast.
Heavily T2-weighted images of the biliary and pancreatic ducts were
obtained, and three-dimensional MRCP images were rendered by post
processing.
CONTRAST:  6mL GADAVIST GADOBUTROL 1 MMOL/ML IV SOLN

[Series 3: T2 fat-sat · axial · 6.0mm · 1.25mm/px · 1 of 36 slices shown]
[im 1/36]
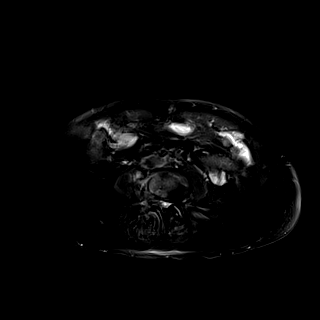

[Series 5: DWI · axial · 6.0mm · 1.49mm/px · z∈[-124,+128]mm · 3 of 72 slices shown (1 of 2)]
[im 1/72]
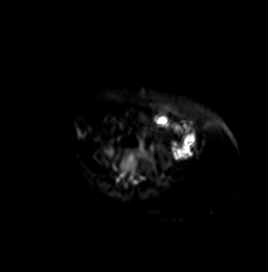
[im 36/72]
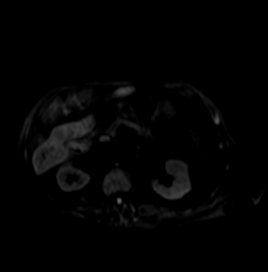
[im 72/72]
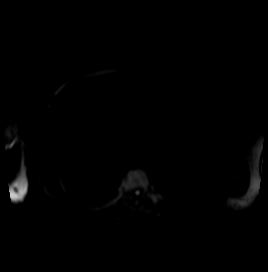

[Series 6: DWI · axial · 6.0mm · 1.49mm/px · 1 of 36 slices shown (2 of 2)]
[im 1/36]
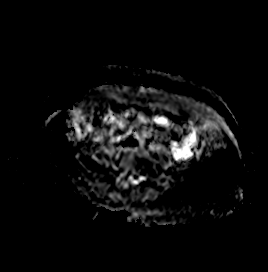

[Series 8: cor_3d_spc_trig · coronal · 1.0mm · 0.49mm/px · 3 of 72 slices shown]
[im 1/72]
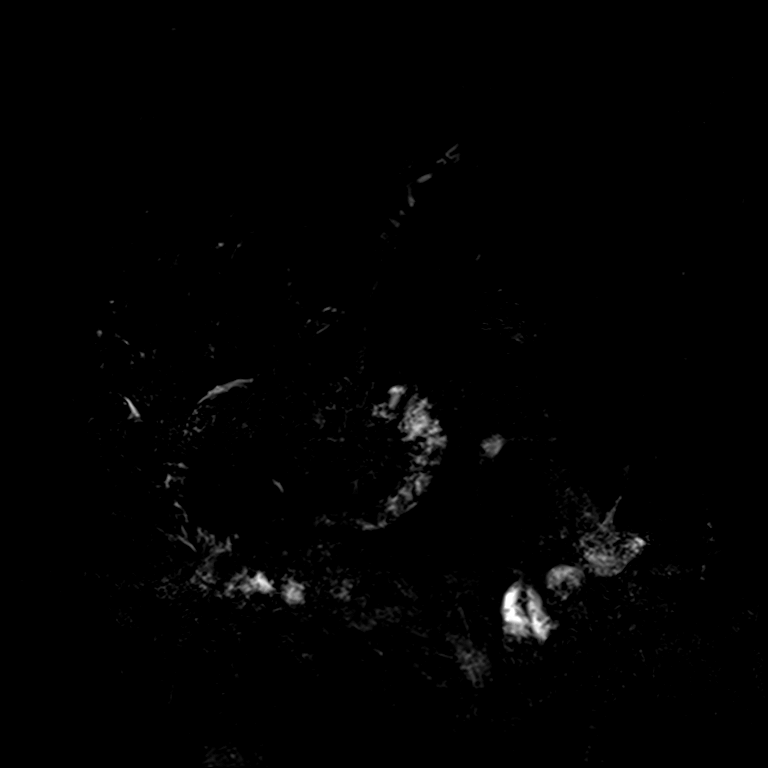
[im 36/72]
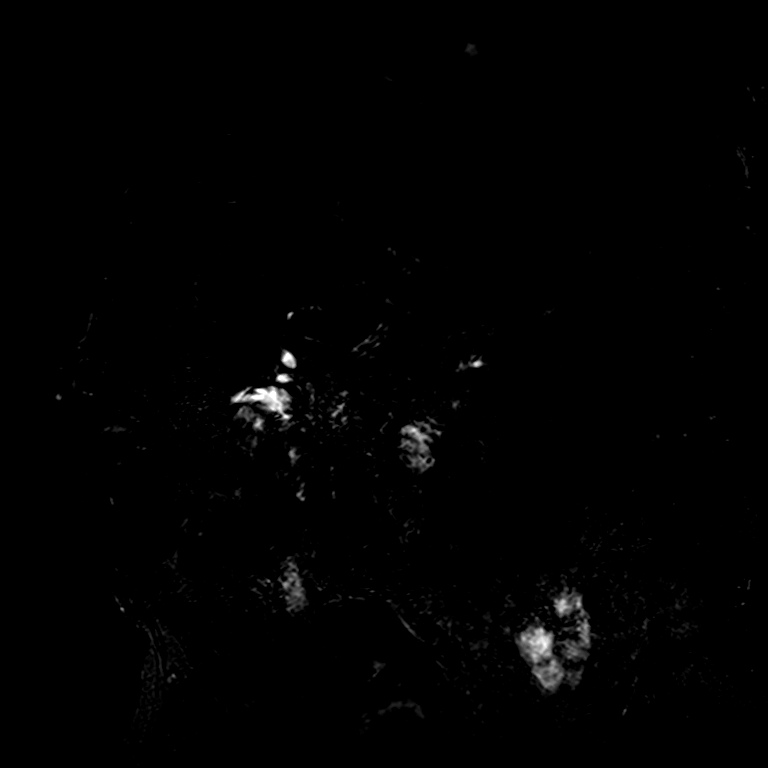
[im 72/72]
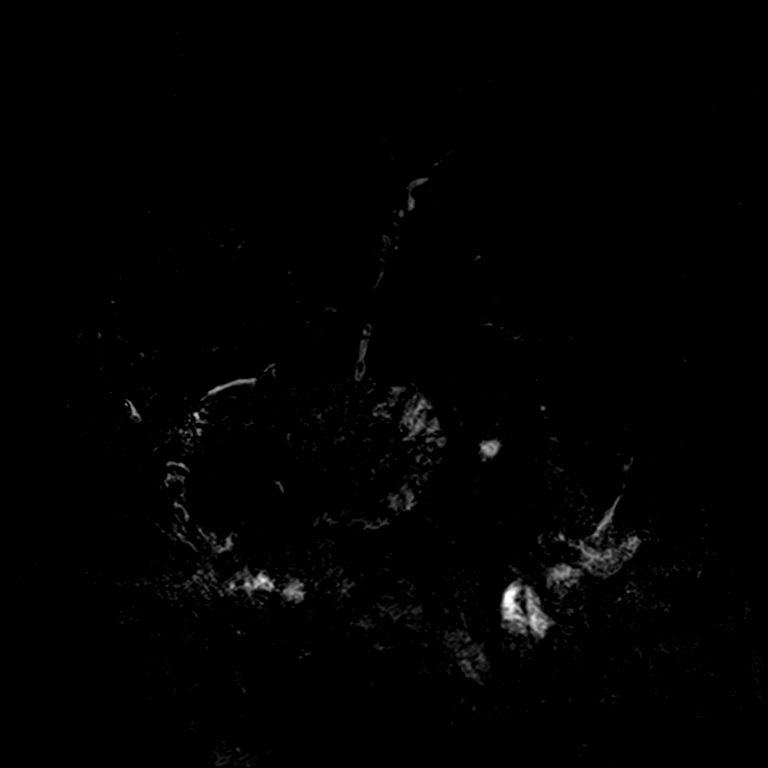

[Series 12: T2 · coronal · 6.0mm · 1.48mm/px · 1 of 30 slices shown (1 of 2)]
[im 1/30]
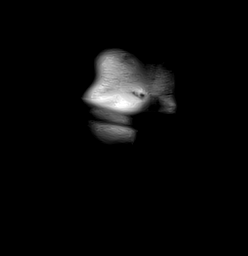

[Series 13: T1 · axial · 3.0mm · 1.25mm/px · z∈[-118,+118]mm · 3 of 80 slices shown (1 of 2)]
[im 1/80]
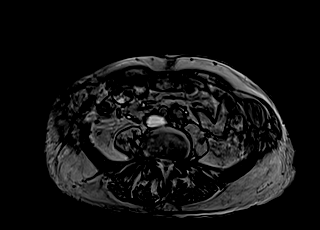
[im 40/80]
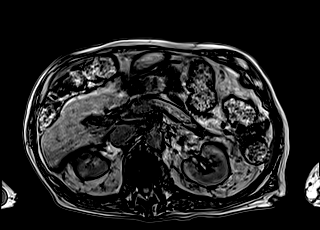
[im 80/80]
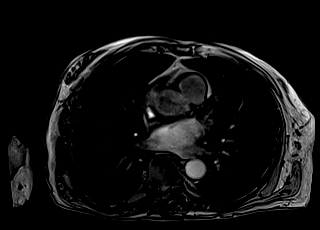

[Series 14: T1 · axial · 3.0mm · 1.25mm/px · z∈[-118,+118]mm · 3 of 80 slices shown (2 of 2)]
[im 1/80]
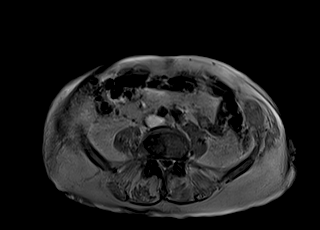
[im 40/80]
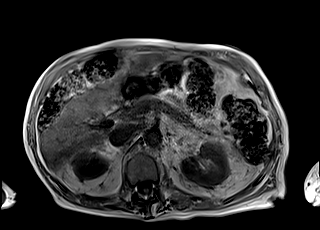
[im 80/80]
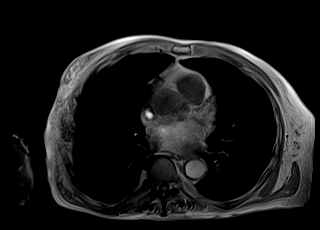

[Series 15: cor obl thk · sagittal · 50.0mm · 0.78mm/px · 1 of 9 slices shown]
[im 1/9]
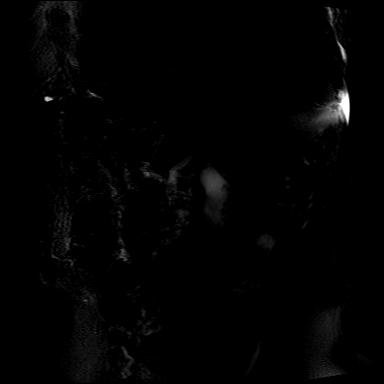

[Series 16: T2 · axial · 6.0mm · 1.56mm/px · 1 of 36 slices shown (2 of 2)]
[im 1/36]
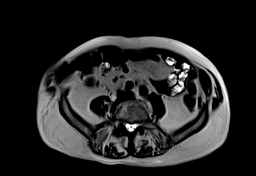

[Series 18: T1 dynamic · axial · 3.0mm · 1.25mm/px · z∈[-135,+102]mm · 3 of 80 slices shown (1 of 8)]
[im 1/80]
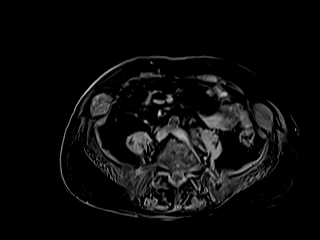
[im 40/80]
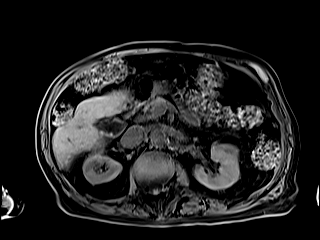
[im 80/80]
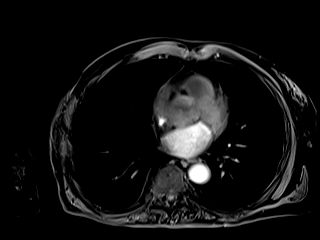

[Series 22: T1 dynamic · axial · 3.0mm · 1.25mm/px · z∈[-135,+102]mm · 3 of 80 slices shown (2 of 8)]
[im 1/80]
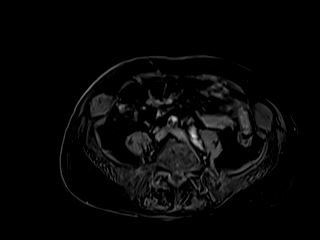
[im 40/80]
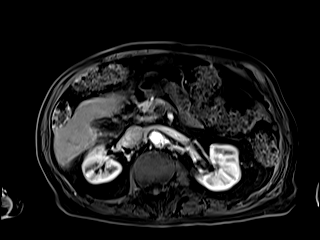
[im 80/80]
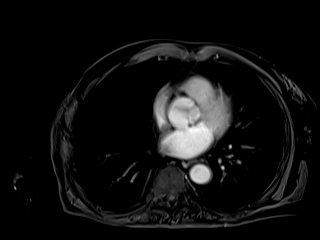

[Series 24: T1 dynamic · axial · 3.0mm · 1.25mm/px · z∈[-135,+102]mm · 3 of 80 slices shown (3 of 8)]
[im 1/80]
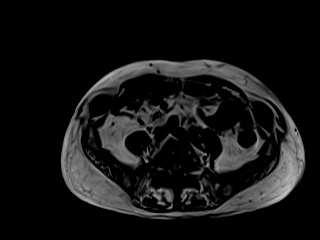
[im 40/80]
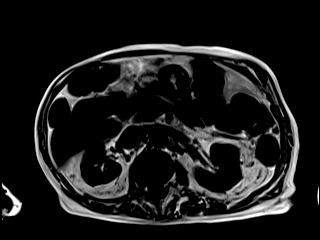
[im 80/80]
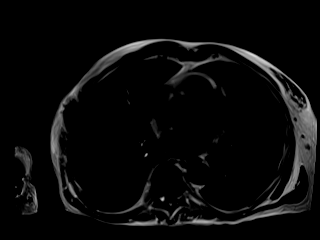

[Series 26: T1 dynamic · axial · 3.0mm · 1.25mm/px · z∈[-135,+102]mm · 3 of 80 slices shown (4 of 8)]
[im 1/80]
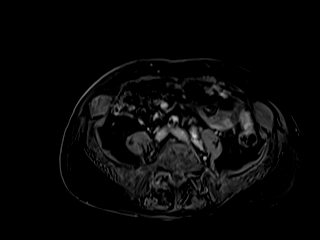
[im 40/80]
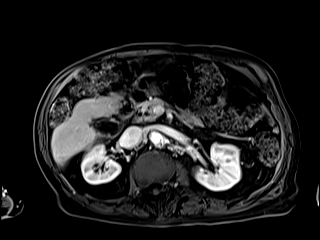
[im 80/80]
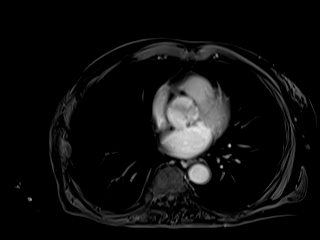

[Series 27: T1 dynamic · axial · 3.0mm · 1.25mm/px · z∈[-135,+102]mm · 3 of 80 slices shown (5 of 8)]
[im 1/80]
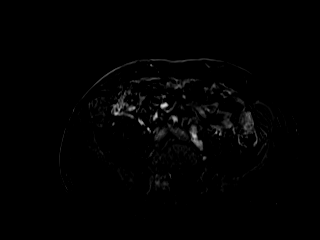
[im 40/80]
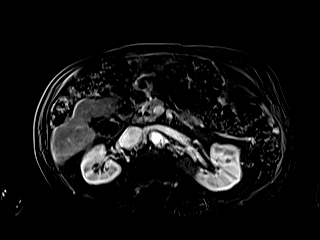
[im 80/80]
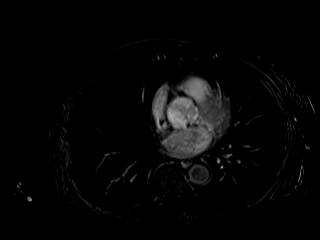

[Series 30: T1 dynamic · axial · 3.0mm · 1.25mm/px · z∈[-135,+102]mm · 3 of 80 slices shown (6 of 8)]
[im 1/80]
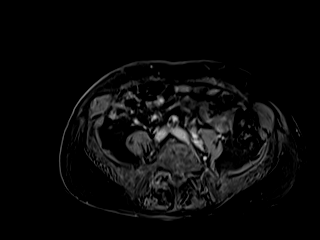
[im 40/80]
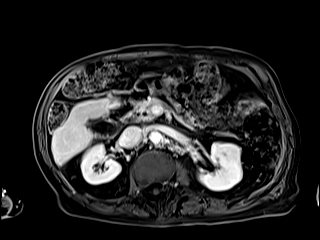
[im 80/80]
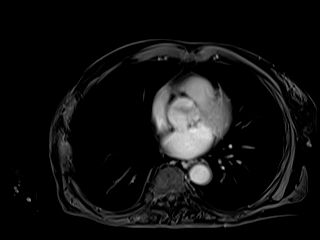

[Series 31: T1 dynamic · axial · 3.0mm · 1.25mm/px · z∈[-135,+102]mm · 3 of 80 slices shown (7 of 8)]
[im 1/80]
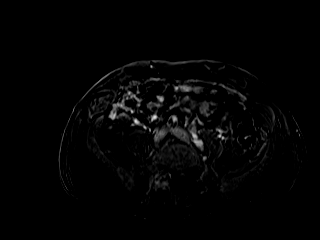
[im 40/80]
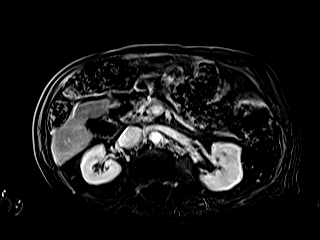
[im 80/80]
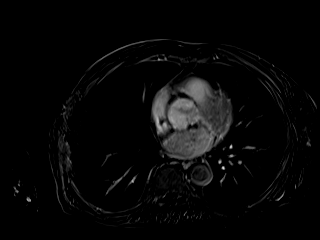

[Series 33: T1 dynamic · coronal · 4.0mm · 1.41mm/px · 2 of 56 slices shown (8 of 8)]
[im 1/56]
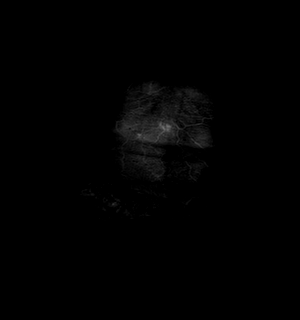
[im 56/56]
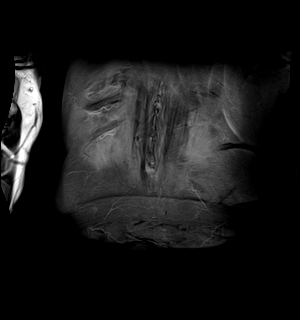

[40 of 48 positions shown; findings below may reference images not displayed]

FINDINGS: Study is limited due to motion.

Lower chest: No acute findings.

Hepatobiliary: Mildly nodular contour of the liver with diffuse
ill-defined and heterogeneous mildly hyperintense T2 signal in the
parenchyma. No focal arterially enhancing delayed washout mass
identified. Numerous calculi identified in the gallbladder. No
gallbladder wall thickening or pericholecystic fluid identified.
Common bile duct is normal caliber. No definite choledocholithiasis
visualized, given limitations of motion.

Pancreas: Pancreatic duct is mildly dilated measuring 4 mm diameter.
No pancreatic mass or inflammatory changes identified.

Spleen:  Splenectomy changes.

Adrenals/Urinary Tract: Adrenal glands appear normal. A few renal
cysts identified bilaterally measuring up to 1.7 cm in the lower
right kidney. No hydronephrosis identified bilaterally.

Stomach/Bowel: No evidence of bowel obstruction. Retained fecal
material throughout the visualized colon.

Vascular/Lymphatic: No pathologically enlarged lymph nodes
identified. No abdominal aortic aneurysm demonstrated.

Other:  No ascites visualized.

Musculoskeletal: Scoliosis and degenerative changes of the spine. No
suspicious bony lesions identified.
IMPRESSION: 1. Cholelithiasis. No biliary ductal dilatation or definite
choledocholithiasis visualized.
2. Heterogeneous liver parenchyma with mild nodularity of the
contour, suggesting cirrhosis. No focal suspicious hepatic mass
visualized.
3. Mild pancreatic ductal dilatation.
4. Study is limited due to motion.

## 2021-09-29 MED ORDER — GADOBUTROL 1 MMOL/ML IV SOLN
6.0000 mL | Freq: Once | INTRAVENOUS | Status: AC | PRN
  Administered 2021-09-29: 6 mL via INTRAVENOUS

## 2021-10-06 NOTE — Progress Notes (Signed)
Maya,  Can you please contact Billy Stone and let him know that the MRI did not show any concerning findings.  The bile duct dilation noted on the ultrasound was not present.  I would like to proceed with an EGD after he gets his dental issues addressed.

## 2021-10-10 ENCOUNTER — Other Ambulatory Visit: Payer: Self-pay

## 2021-10-11 ENCOUNTER — Ambulatory Visit (INDEPENDENT_AMBULATORY_CARE_PROVIDER_SITE_OTHER): Payer: Medicare Other | Admitting: Family Medicine

## 2021-10-11 ENCOUNTER — Encounter: Payer: Self-pay | Admitting: Family Medicine

## 2021-10-11 VITALS — BP 130/82 | HR 56 | Temp 98.3°F | Ht 65.0 in | Wt 145.4 lb

## 2021-10-11 DIAGNOSIS — K766 Portal hypertension: Secondary | ICD-10-CM | POA: Diagnosis not present

## 2021-10-11 DIAGNOSIS — K7031 Alcoholic cirrhosis of liver with ascites: Secondary | ICD-10-CM | POA: Diagnosis not present

## 2021-10-11 DIAGNOSIS — K3189 Other diseases of stomach and duodenum: Secondary | ICD-10-CM

## 2021-10-11 DIAGNOSIS — F1021 Alcohol dependence, in remission: Secondary | ICD-10-CM

## 2021-10-11 DIAGNOSIS — I10 Essential (primary) hypertension: Secondary | ICD-10-CM | POA: Diagnosis not present

## 2021-10-11 DIAGNOSIS — I4891 Unspecified atrial fibrillation: Secondary | ICD-10-CM

## 2021-10-11 DIAGNOSIS — K8020 Calculus of gallbladder without cholecystitis without obstruction: Secondary | ICD-10-CM

## 2021-10-11 DIAGNOSIS — K029 Dental caries, unspecified: Secondary | ICD-10-CM

## 2021-10-11 MED ORDER — FUROSEMIDE 20 MG PO TABS: ORAL_TABLET | ORAL | 10 refills | Status: DC
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
## 2021-10-11 NOTE — Addendum Note (Signed)
Addended by: Loyola Mast on: 10/11/2021 11:42 AM   Modules accepted: Orders
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
## 2021-10-19 ENCOUNTER — Other Ambulatory Visit: Payer: Self-pay

## 2021-10-19 ENCOUNTER — Ambulatory Visit (INDEPENDENT_AMBULATORY_CARE_PROVIDER_SITE_OTHER): Payer: Medicare Other | Admitting: Cardiology

## 2021-10-19 ENCOUNTER — Encounter: Payer: Self-pay | Admitting: Cardiology

## 2021-10-19 VITALS — BP 152/90 | HR 57 | Ht 65.0 in | Wt 142.2 lb

## 2021-10-19 DIAGNOSIS — I25119 Atherosclerotic heart disease of native coronary artery with unspecified angina pectoris: Secondary | ICD-10-CM | POA: Diagnosis not present

## 2021-10-19 DIAGNOSIS — I350 Nonrheumatic aortic (valve) stenosis: Secondary | ICD-10-CM

## 2021-10-19 DIAGNOSIS — I493 Ventricular premature depolarization: Secondary | ICD-10-CM | POA: Diagnosis not present

## 2021-10-19 DIAGNOSIS — I77819 Aortic ectasia, unspecified site: Secondary | ICD-10-CM | POA: Diagnosis not present

## 2021-10-19 LAB — COMPREHENSIVE METABOLIC PANEL
ALT: 9 IU/L (ref 0–44)
AST: 22 IU/L (ref 0–40)
Albumin/Globulin Ratio: 0.9 — ABNORMAL LOW (ref 1.2–2.2)
Albumin: 3.8 g/dL (ref 3.7–4.7)
Alkaline Phosphatase: 169 IU/L — ABNORMAL HIGH (ref 44–121)
BUN/Creatinine Ratio: 14 (ref 10–24)
BUN: 12 mg/dL (ref 8–27)
Bilirubin Total: 0.7 mg/dL (ref 0.0–1.2)
CO2: 23 mmol/L (ref 20–29)
Calcium: 9.1 mg/dL (ref 8.6–10.2)
Chloride: 99 mmol/L (ref 96–106)
Creatinine, Ser: 0.83 mg/dL (ref 0.76–1.27)
Globulin, Total: 4.3 g/dL (ref 1.5–4.5)
Glucose: 82 mg/dL (ref 70–99)
Potassium: 4.3 mmol/L (ref 3.5–5.2)
Sodium: 139 mmol/L (ref 134–144)
Total Protein: 8.1 g/dL (ref 6.0–8.5)
eGFR: 93 mL/min/{1.73_m2} (ref >59)

## 2021-10-19 LAB — CBC
Hematocrit: 35.6 % — ABNORMAL LOW (ref 37.5–51.0)
Hemoglobin: 12.2 g/dL — ABNORMAL LOW (ref 13.0–17.7)
MCH: 30 pg (ref 26.6–33.0)
MCHC: 34.3 g/dL (ref 31.5–35.7)
MCV: 88 fL (ref 79–97)
Platelets: 287 10*3/uL (ref 150–450)
RBC: 4.06 x10E6/uL — ABNORMAL LOW (ref 4.14–5.80)
RDW: 13 % (ref 11.6–15.4)
WBC: 10.9 10*3/uL — ABNORMAL HIGH (ref 3.4–10.8)

## 2021-10-19 LAB — LIPID PANEL
Chol/HDL Ratio: 3.4 ratio (ref 0.0–5.0)
Cholesterol, Total: 128 mg/dL (ref 100–199)
HDL: 38 mg/dL — ABNORMAL LOW (ref >39)
LDL Chol Calc (NIH): 75 mg/dL (ref 0–99)
Triglycerides: 73 mg/dL (ref 0–149)
VLDL Cholesterol Cal: 15 mg/dL (ref 5–40)

## 2021-10-19 NOTE — Patient Instructions (Signed)
Medication Instructions:  No Changes In Medications at this time.  *If you need a refill on your cardiac medications before your next appointment, please call your pharmacy*  Lab Work: BLOOD WORK TODAY  If you have labs (blood work) drawn today and your tests are completely normal, you will receive your results only by: Central (if you have MyChart) OR A paper copy in the mail If you have any lab test that is abnormal or we need to change your treatment, we will call you to review the results.  Testing/Procedures: Your physician has requested that you have an echocardiogram IN 6 MONTHS. Echocardiography is a painless test that uses sound waves to create images of your heart. It provides your doctor with information about the size and shape of your heart and how well your hearts chambers and valves are working. You may receive an ultrasound enhancing agent through an IV if needed to better visualize your heart during the echo.This procedure takes approximately one hour. There are no restrictions for this procedure. This will take place at the [address], [address].   Follow-Up: At [facility], you and your health needs are our priority.  As part of our continuing mission to provide you with exceptional heart care, we have created designated Provider Care Teams.  These Care Teams include your primary Cardiologist (physician) and Advanced Practice Providers (APPs -  Physician Assistants and Nurse Practitioners) who all work together to provide you with the care you need, when you need it.  We recommend signing up for the patient portal called "MyChart".  Sign up information is provided on this After Visit Summary.  MyChart is used to connect with patients for Virtual Visits (Telemedicine).  Patients are able to view lab/test results, encounter notes, upcoming appointments, etc.  Non-urgent messages can be sent to your provider as well.   To learn more about what you can do with  MyChart, go to [email or web address].    Your next appointment:   6 month(s) RIGHT AFTER ECHO  The format for your next appointment:   In Person  Provider:   Donato Heinz, MD    Other Instructions Please check your blood pressure at home TWICE daily, write it down.  Call the office or send message via Mychart with the readings in 1 week for Dr. Gardiner Rhyme to review.

## 2021-10-24 ENCOUNTER — Telehealth: Payer: Self-pay | Admitting: *Deleted

## 2021-10-24 DIAGNOSIS — I25119 Atherosclerotic heart disease of native coronary artery with unspecified angina pectoris: Secondary | ICD-10-CM

## 2021-10-24 MED ORDER — ASPIRIN EC 81 MG PO TBEC: 81.0000 mg | DELAYED_RELEASE_TABLET | Freq: Every day | ORAL | 3 refills | Status: DC

## 2021-10-24 MED ORDER — ROSUVASTATIN CALCIUM 5 MG PO TABS: 5.0000 mg | ORAL_TABLET | Freq: Every day | ORAL | 3 refills | Status: DC

## 2021-10-24 NOTE — Telephone Encounter (Signed)
-----   Message from Little Ishikawa, MD sent at 10/21/2021 11:27 AM EST ----- Stable labs.  Discussed with his gastroenterologist, and okay to start aspirin and statin.  Recommend starting aspirin 81 mg daily and rosuvastatin 5 mg daily.  Recommend repeat lipid panel and LFTs in 6 weeks

## 2021-10-24 NOTE — Telephone Encounter (Signed)
Spoke with pt, aware of results. New script sent to the pharmacy Lab orders mailed to the pt  

## 2021-10-26 ENCOUNTER — Encounter: Payer: Self-pay | Admitting: Podiatry

## 2021-10-26 ENCOUNTER — Other Ambulatory Visit: Payer: Self-pay

## 2021-10-26 ENCOUNTER — Ambulatory Visit (INDEPENDENT_AMBULATORY_CARE_PROVIDER_SITE_OTHER): Payer: Medicare Other | Admitting: Podiatry

## 2021-10-26 DIAGNOSIS — M79675 Pain in left toe(s): Secondary | ICD-10-CM

## 2021-10-26 DIAGNOSIS — M79674 Pain in right toe(s): Secondary | ICD-10-CM

## 2021-10-26 DIAGNOSIS — G6289 Other specified polyneuropathies: Secondary | ICD-10-CM

## 2021-10-26 DIAGNOSIS — B351 Tinea unguium: Secondary | ICD-10-CM | POA: Diagnosis not present

## 2021-10-26 NOTE — Progress Notes (Signed)
This patient presents to the office with chief complaint of long thick painful nails.  Patient says the nails are painful walking and wearing shoes.  This patient is unable to self treat.  This patient is unable to trim his nails since she is unable to reach heis nails.  She presents to the office for preventative foot care services.  General Appearance  Alert, conversant and in no acute stress.  Vascular  Dorsalis pedis and posterior tibial  pulses are palpable  bilaterally.  Capillary return is within normal limits  bilaterally. Temperature is within normal limits  bilaterally.  Neurologic  Senn-Weinstein monofilament wire test diminished  bilaterally. Muscle power within normal limits bilaterally.  Nails Thick disfigured discolored nails with subungual debris  from hallux to fifth toes bilaterally. No evidence of bacterial infection or drainage bilaterally.  Orthopedic  No limitations of motion  feet .  No crepitus or effusions noted.  No bony pathology or digital deformities noted.  HAV  B/L.  Skin  normotropic skin with no porokeratosis noted bilaterally.  No signs of infections or ulcers noted.     Onychomycosis  Nails  B/L.  Pain in right toes  Pain in left toes  Debridement of nails both feet followed trimming the nails with dremel tool.    RTC 3 months.   Jarrin Staley DPM   

## 2021-11-02 ENCOUNTER — Telehealth: Payer: Self-pay | Admitting: Cardiology

## 2021-11-02 DIAGNOSIS — R319 Hematuria, unspecified: Secondary | ICD-10-CM

## 2021-11-02 NOTE — Telephone Encounter (Signed)
Patient called to informed Dr  Gardiner Rhyme that tried taking Aspirin 81.mg  2 separate days about week ago as he request. ? Patient states the first day  before he  urinated a few drops of blood strted beroehis urie stream . Patient states he did not take next day but tried the following day and the same think happened.  ? Patient just wanted Dr Gardiner Rhyme to know ,but again this has been about a week ago . ? ? RN informed patient will defer to Dr Gardiner Rhyme . Will contact with any further direction if needed ?

## 2021-11-02 NOTE — Telephone Encounter (Signed)
Pt c/o medication issue: ? ?1. Name of Medication: Aspirin 81 mg  ? ?2. How are you currently taking this medication (dosage and times per day)? Patient only took for two days ? ?3. Are you having a reaction (difficulty breathing--STAT)?  ? ?4. What is your medication issue? Patient was concerned with bleeding. He was on aspirin for two days and had bleeding issues so he stopped. He just wanted to let Dr. Bjorn Pippin know  ?

## 2021-11-03 NOTE — Telephone Encounter (Signed)
Patient aware and verbalized understanding.  Orders placed.  

## 2021-11-03 NOTE — Telephone Encounter (Signed)
Recommend checking urinalysis, urine culture to exclude UTI.  Would also check CBC ?

## 2021-11-16 DIAGNOSIS — R319 Hematuria, unspecified: Secondary | ICD-10-CM | POA: Diagnosis not present

## 2021-11-16 LAB — CBC
Hematocrit: 34.4 % — ABNORMAL LOW (ref 37.5–51.0)
Hemoglobin: 11.6 g/dL — ABNORMAL LOW (ref 13.0–17.7)
MCH: 29.8 pg (ref 26.6–33.0)
MCHC: 33.7 g/dL (ref 31.5–35.7)
MCV: 88 fL (ref 79–97)
Platelets: 251 10*3/uL (ref 150–450)
RBC: 3.89 x10E6/uL — ABNORMAL LOW (ref 4.14–5.80)
RDW: 13.2 % (ref 11.6–15.4)
WBC: 9.7 10*3/uL (ref 3.4–10.8)

## 2021-11-18 DIAGNOSIS — Z20822 Contact with and (suspected) exposure to covid-19: Secondary | ICD-10-CM | POA: Diagnosis not present

## 2021-11-22 ENCOUNTER — Encounter: Payer: Self-pay | Admitting: *Deleted

## 2021-12-08 ENCOUNTER — Other Ambulatory Visit: Payer: Self-pay | Admitting: Cardiology

## 2021-12-21 DIAGNOSIS — Z20822 Contact with and (suspected) exposure to covid-19: Secondary | ICD-10-CM | POA: Diagnosis not present

## 2021-12-27 DIAGNOSIS — Z20822 Contact with and (suspected) exposure to covid-19: Secondary | ICD-10-CM | POA: Diagnosis not present

## 2021-12-29 DIAGNOSIS — Z20822 Contact with and (suspected) exposure to covid-19: Secondary | ICD-10-CM | POA: Diagnosis not present

## 2022-01-10 ENCOUNTER — Encounter: Payer: Self-pay | Admitting: Family Medicine

## 2022-01-10 ENCOUNTER — Ambulatory Visit (INDEPENDENT_AMBULATORY_CARE_PROVIDER_SITE_OTHER): Payer: Medicare Other | Admitting: Family Medicine

## 2022-01-10 VITALS — BP 132/68 | HR 71 | Temp 97.0°F | Ht 65.0 in | Wt 128.0 lb

## 2022-01-10 DIAGNOSIS — R310 Gross hematuria: Secondary | ICD-10-CM

## 2022-01-10 DIAGNOSIS — E43 Unspecified severe protein-calorie malnutrition: Secondary | ICD-10-CM | POA: Diagnosis not present

## 2022-01-10 DIAGNOSIS — R634 Abnormal weight loss: Secondary | ICD-10-CM | POA: Diagnosis not present

## 2022-01-10 DIAGNOSIS — I10 Essential (primary) hypertension: Secondary | ICD-10-CM

## 2022-01-10 DIAGNOSIS — K7031 Alcoholic cirrhosis of liver with ascites: Secondary | ICD-10-CM | POA: Diagnosis not present

## 2022-01-10 DIAGNOSIS — K029 Dental caries, unspecified: Secondary | ICD-10-CM | POA: Diagnosis not present

## 2022-01-10 DIAGNOSIS — Z125 Encounter for screening for malignant neoplasm of prostate: Secondary | ICD-10-CM

## 2022-01-10 LAB — COMPREHENSIVE METABOLIC PANEL
ALT: 15 U/L (ref 0–53)
AST: 21 U/L (ref 0–37)
Albumin: 3.4 g/dL — ABNORMAL LOW (ref 3.5–5.2)
Alkaline Phosphatase: 160 U/L — ABNORMAL HIGH (ref 39–117)
BUN: 18 mg/dL (ref 6–23)
CO2: 25 mEq/L (ref 19–32)
Calcium: 9.3 mg/dL (ref 8.4–10.5)
Chloride: 103 mEq/L (ref 96–112)
Creatinine, Ser: 0.62 mg/dL (ref 0.40–1.50)
GFR: 95.06 mL/min (ref >60.00)
Glucose, Bld: 103 mg/dL — ABNORMAL HIGH (ref 70–99)
Potassium: 4.5 mEq/L (ref 3.5–5.1)
Sodium: 137 mEq/L (ref 135–145)
Total Bilirubin: 0.7 mg/dL (ref 0.2–1.2)
Total Protein: 7.2 g/dL (ref 6.0–8.3)

## 2022-01-10 LAB — PSA: PSA: 0.16 ng/mL (ref 0.10–4.00)
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
## 2022-01-11 LAB — CBC
HCT: 36.5 % — ABNORMAL LOW (ref 39.0–52.0)
Hemoglobin: 12 g/dL — ABNORMAL LOW (ref 13.0–17.0)
MCHC: 32.9 g/dL (ref 30.0–36.0)
MCV: 92.1 fl (ref 78.0–100.0)
Platelets: 242 10*3/uL (ref 150.0–400.0)
RBC: 3.96 Mil/uL — ABNORMAL LOW (ref 4.22–5.81)
RDW: 15 % (ref 11.5–15.5)
WBC: 8.2 10*3/uL (ref 4.0–10.5)

## 2022-01-13 LAB — URINALYSIS W MICROSCOPIC + REFLEX CULTURE
Bacteria, UA: NONE SEEN /HPF
Bilirubin Urine: NEGATIVE
Glucose, UA: NEGATIVE
Hgb urine dipstick: NEGATIVE
Hyaline Cast: NONE SEEN /LPF
Ketones, ur: NEGATIVE
Nitrites, Initial: NEGATIVE
RBC / HPF: NONE SEEN /HPF (ref 0–2)
Specific Gravity, Urine: 1.015 (ref 1.001–1.035)
Squamous Epithelial / HPF: NONE SEEN /HPF (ref <=5)
pH: 6.5 (ref 5.0–8.0)

## 2022-01-13 LAB — URINE CULTURE
MICRO NUMBER:: 13405225
SPECIMEN QUALITY:: ADEQUATE

## 2022-01-13 LAB — CULTURE INDICATED

## 2022-02-01 ENCOUNTER — Ambulatory Visit: Payer: Medicare Other | Admitting: Podiatry

## 2022-02-02 ENCOUNTER — Other Ambulatory Visit: Payer: Self-pay | Admitting: Gastroenterology

## 2022-02-03 ENCOUNTER — Other Ambulatory Visit: Payer: Self-pay | Admitting: Cardiology

## 2022-02-08 ENCOUNTER — Other Ambulatory Visit: Payer: Self-pay | Admitting: Gastroenterology

## 2022-02-09 ENCOUNTER — Other Ambulatory Visit: Payer: Self-pay | Admitting: Gastroenterology

## 2022-03-03 ENCOUNTER — Encounter: Payer: Self-pay | Admitting: Internal Medicine

## 2022-03-06 ENCOUNTER — Other Ambulatory Visit: Payer: Self-pay | Admitting: Cardiology

## 2022-03-16 ENCOUNTER — Other Ambulatory Visit: Payer: Self-pay | Admitting: Gastroenterology

## 2022-03-23 DIAGNOSIS — Z03822 Encounter for observation for suspected aspirated (inhaled) foreign body ruled out: Secondary | ICD-10-CM | POA: Diagnosis not present

## 2022-04-17 ENCOUNTER — Telehealth (HOSPITAL_COMMUNITY): Payer: Self-pay | Admitting: Cardiology

## 2022-04-17 NOTE — Telephone Encounter (Signed)
Patient cancelled echocardiogram for 04/18/22. He will call us back to reschedule when he speaks with his brother. Order will be removed from the active echo WQ and when he calls back we can reinstate the order. Thank you.

## 2022-04-18 ENCOUNTER — Ambulatory Visit: Payer: Medicare Other | Admitting: Family Medicine

## 2022-04-18 ENCOUNTER — Ambulatory Visit (HOSPITAL_COMMUNITY): Payer: Medicare Other

## 2022-05-18 ENCOUNTER — Ambulatory Visit (INDEPENDENT_AMBULATORY_CARE_PROVIDER_SITE_OTHER): Payer: Medicare Other | Admitting: Family Medicine

## 2022-05-18 ENCOUNTER — Encounter: Payer: Self-pay | Admitting: Family Medicine

## 2022-05-18 VITALS — BP 130/70 | HR 49 | Temp 97.7°F | Ht 65.0 in | Wt 133.6 lb

## 2022-05-18 DIAGNOSIS — D509 Iron deficiency anemia, unspecified: Secondary | ICD-10-CM | POA: Insufficient documentation

## 2022-05-18 DIAGNOSIS — M159 Polyosteoarthritis, unspecified: Secondary | ICD-10-CM

## 2022-05-18 DIAGNOSIS — I10 Essential (primary) hypertension: Secondary | ICD-10-CM | POA: Diagnosis not present

## 2022-05-18 DIAGNOSIS — Z23 Encounter for immunization: Secondary | ICD-10-CM

## 2022-05-18 DIAGNOSIS — I4891 Unspecified atrial fibrillation: Secondary | ICD-10-CM | POA: Diagnosis not present

## 2022-05-18 DIAGNOSIS — D649 Anemia, unspecified: Secondary | ICD-10-CM | POA: Insufficient documentation

## 2022-05-18 DIAGNOSIS — I25118 Atherosclerotic heart disease of native coronary artery with other forms of angina pectoris: Secondary | ICD-10-CM | POA: Diagnosis not present

## 2022-05-18 DIAGNOSIS — E43 Unspecified severe protein-calorie malnutrition: Secondary | ICD-10-CM | POA: Diagnosis not present

## 2022-05-18 DIAGNOSIS — M47816 Spondylosis without myelopathy or radiculopathy, lumbar region: Secondary | ICD-10-CM

## 2022-05-18 LAB — CBC
HCT: 37 % — ABNORMAL LOW (ref 39.0–52.0)
Hemoglobin: 12.2 g/dL — ABNORMAL LOW (ref 13.0–17.0)
MCHC: 32.9 g/dL (ref 30.0–36.0)
MCV: 91.9 fl (ref 78.0–100.0)
Platelets: 242 10*3/uL (ref 150.0–400.0)
RBC: 4.03 Mil/uL — ABNORMAL LOW (ref 4.22–5.81)
RDW: 15 % (ref 11.5–15.5)
WBC: 8.3 10*3/uL (ref 4.0–10.5)

## 2022-05-18 LAB — COMPREHENSIVE METABOLIC PANEL
ALT: 12 U/L (ref 0–53)
AST: 21 U/L (ref 0–37)
Albumin: 3.6 g/dL (ref 3.5–5.2)
Alkaline Phosphatase: 126 U/L — ABNORMAL HIGH (ref 39–117)
BUN: 20 mg/dL (ref 6–23)
CO2: 28 mEq/L (ref 19–32)
Calcium: 9.3 mg/dL (ref 8.4–10.5)
Chloride: 101 mEq/L (ref 96–112)
Creatinine, Ser: 0.76 mg/dL (ref 0.40–1.50)
GFR: 89.17 mL/min (ref >60.00)
Glucose, Bld: 108 mg/dL — ABNORMAL HIGH (ref 70–99)
Potassium: 4.2 mEq/L (ref 3.5–5.1)
Sodium: 135 mEq/L (ref 135–145)
Total Bilirubin: 0.5 mg/dL (ref 0.2–1.2)
Total Protein: 8.2 g/dL (ref 6.0–8.3)

## 2022-05-18 LAB — VITAMIN D 25 HYDROXY (VIT D DEFICIENCY, FRACTURES): VITD: 28.94 ng/mL — ABNORMAL LOW (ref 30.00–100.00)

## 2022-05-18 LAB — VITAMIN B12: Vitamin B-12: 764 pg/mL (ref 211–911)

## 2022-05-18 LAB — FOLATE: Folate: >23.9 ng/mL (ref >5.9)
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
## 2022-06-05 ENCOUNTER — Telehealth: Payer: Self-pay | Admitting: Family Medicine

## 2022-06-05 NOTE — Telephone Encounter (Signed)
[id number] from [facility] is calling in to report pt's heart rate is 48, she is with pt. I transferred her over to nurse triage.

## 2022-06-06 ENCOUNTER — Other Ambulatory Visit: Payer: Self-pay | Admitting: Gastroenterology

## 2022-06-06 NOTE — Telephone Encounter (Signed)
Spoke to patient and he states that his heart rate did go back up and is feeling okay.  Advised to call us if he needs to make an appointment.  Dm/cma
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
## 2022-06-27 ENCOUNTER — Ambulatory Visit: Payer: Medicare Other | Attending: General Practice | Admitting: General Practice

## 2022-06-27 ENCOUNTER — Encounter: Payer: Self-pay | Admitting: General Practice

## 2022-06-27 VITALS — BP 132/64 | HR 57 | Ht 64.0 in | Wt 136.0 lb

## 2022-06-27 DIAGNOSIS — I350 Nonrheumatic aortic (valve) stenosis: Secondary | ICD-10-CM | POA: Insufficient documentation

## 2022-06-27 DIAGNOSIS — I77819 Aortic ectasia, unspecified site: Secondary | ICD-10-CM | POA: Insufficient documentation

## 2022-06-27 DIAGNOSIS — I493 Ventricular premature depolarization: Secondary | ICD-10-CM | POA: Diagnosis not present

## 2022-06-27 DIAGNOSIS — I480 Paroxysmal atrial fibrillation: Secondary | ICD-10-CM | POA: Insufficient documentation

## 2022-06-27 DIAGNOSIS — K7460 Unspecified cirrhosis of liver: Secondary | ICD-10-CM | POA: Diagnosis not present

## 2022-06-27 DIAGNOSIS — I10 Essential (primary) hypertension: Secondary | ICD-10-CM | POA: Diagnosis not present

## 2022-06-27 DIAGNOSIS — I25119 Atherosclerotic heart disease of native coronary artery with unspecified angina pectoris: Secondary | ICD-10-CM | POA: Diagnosis not present

## 2022-06-27 NOTE — Patient Instructions (Signed)
Medication Instructions:  No Changes *If you need a refill on your cardiac medications before your next appointment, please call your pharmacy*   Lab Work: No labs If you have labs (blood work) drawn today and your tests are completely normal, you will receive your results only by: [place] (if you have MyChart) OR A paper copy in the mail If you have any lab test that is abnormal or we need to change your treatment, we will call you to review the results.   Testing/Procedures: [address], [address]. Your physician has requested that you have an echocardiogram. Echocardiography is a painless test that uses sound waves to create images of your heart. It provides your doctor with information about the size and shape of your heart and how well your heart's chambers and valves are working. This procedure takes approximately one hour. There are no restrictions for this procedure. Please do NOT wear cologne, perfume, aftershave, or lotions (deodorant is allowed). Please arrive 15 minutes prior to your appointment time.    Follow-Up: At [facility], you and your health needs are our priority.  As part of our continuing mission to provide you with exceptional heart care, we have created designated Provider Care Teams.  These Care Teams include your primary Cardiologist (physician) and Advanced Practice Providers (APPs -  Physician Assistants and Nurse Practitioners) who all work together to provide you with the care you need, when you need it.  We recommend signing up for the patient portal called "MyChart".  Sign up information is provided on this After Visit Summary.  MyChart is used to connect with patients for Virtual Visits (Telemedicine).  Patients are able to view lab/test results, encounter notes, upcoming appointments, etc.  Non-urgent messages can be sent to your provider as well.   To learn more about what you can do with MyChart, go to  [email or web address].    Your next appointment:   6 month(s)  The format for your next appointment:   In Person  Provider:   Donato Heinz, MD    Other Instructions Increase physical Activity.

## 2022-08-15 ENCOUNTER — Ambulatory Visit (INDEPENDENT_AMBULATORY_CARE_PROVIDER_SITE_OTHER): Payer: Medicare Other | Admitting: Family Medicine

## 2022-08-15 ENCOUNTER — Encounter: Payer: Self-pay | Admitting: Family Medicine

## 2022-08-15 VITALS — BP 132/78 | HR 90 | Temp 96.9°F | Ht 64.0 in | Wt 136.2 lb

## 2022-08-15 DIAGNOSIS — E43 Unspecified severe protein-calorie malnutrition: Secondary | ICD-10-CM

## 2022-08-15 DIAGNOSIS — I10 Essential (primary) hypertension: Secondary | ICD-10-CM

## 2022-08-15 DIAGNOSIS — I4891 Unspecified atrial fibrillation: Secondary | ICD-10-CM

## 2022-08-15 DIAGNOSIS — I25118 Atherosclerotic heart disease of native coronary artery with other forms of angina pectoris: Secondary | ICD-10-CM | POA: Diagnosis not present

## 2022-08-15 DIAGNOSIS — K7031 Alcoholic cirrhosis of liver with ascites: Secondary | ICD-10-CM | POA: Diagnosis not present
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
## 2022-09-25 ENCOUNTER — Ambulatory Visit (INDEPENDENT_AMBULATORY_CARE_PROVIDER_SITE_OTHER): Payer: Medicare Other

## 2022-09-25 VITALS — Ht 64.0 in | Wt 135.0 lb

## 2022-09-25 DIAGNOSIS — Z0000 Encounter for general adult medical examination without abnormal findings: Secondary | ICD-10-CM
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
## 2022-09-25 NOTE — Patient Instructions (Signed)
[name] , Thank you for taking time to come for your Medicare Wellness Visit. I appreciate your ongoing commitment to your health goals. Please review the following plan we discussed and let me know if I can assist you in the future.   These are the goals we discussed:  Goals      Patient Stated     09/25/2022, more mobilty        This is a list of the screening recommended for you and due dates:  Health Maintenance  Topic Date Due   Zoster (Shingles) Vaccine (1 of 2) Never done   COVID-19 Vaccine (3 - 2023-24 season) 10/11/2022*   Colon Cancer Screening  08/16/2023*   DTaP/Tdap/Td vaccine (3 - Td or Tdap) 03/27/2026   Pneumonia Vaccine  Completed   Hepatitis C Screening: USPSTF Recommendation to screen - Ages 74-79 yo.  Completed   HPV Vaccine  Aged Out  *Topic was postponed. The date shown is not the original due date.    Advanced directives: Advance directive discussed with you today.   Conditions/risks identified: none  Next appointment: Follow up in one year for your annual wellness visit.   Preventive Care 44 Years and Older, Male  Preventive care refers to lifestyle choices and visits with your health care provider that can promote health and wellness. What does preventive care include? A yearly physical exam. This is also called an annual well check. Dental exams once or twice a year. Routine eye exams. Ask your health care provider how often you should have your eyes checked. Personal lifestyle choices, including: Daily care of your teeth and gums. Regular physical activity. Eating a healthy diet. Avoiding tobacco and drug use. Limiting alcohol use. Practicing safe sex. Taking low doses of aspirin every day. Taking vitamin and mineral supplements as recommended by your health care provider. What happens during an annual well check? The services and screenings done by your health care provider during your annual well check will depend on your age, overall  health, lifestyle risk factors, and family history of disease. Counseling  Your health care provider may ask you questions about your: Alcohol use. Tobacco use. Drug use. Emotional well-being. Home and relationship well-being. Sexual activity. Eating habits. History of falls. Memory and ability to understand (cognition). Work and work Statistician. Screening  You may have the following tests or measurements: Height, weight, and BMI. Blood pressure. Lipid and cholesterol levels. These may be checked every 5 years, or more frequently if you are over 69 years old. Skin check. Lung cancer screening. You may have this screening every year starting at age 59 if you have a 30-pack-year history of smoking and currently smoke or have quit within the past 15 years. Fecal occult blood test (FOBT) of the stool. You may have this test every year starting at age 25. Flexible sigmoidoscopy or colonoscopy. You may have a sigmoidoscopy every 5 years or a colonoscopy every 10 years starting at age 71. Prostate cancer screening. Recommendations will vary depending on your family history and other risks. Hepatitis C blood test. Hepatitis B blood test. Sexually transmitted disease (STD) testing. Diabetes screening. This is done by checking your blood sugar (glucose) after you have not eaten for a while (fasting). You may have this done every 1-3 years. Abdominal aortic aneurysm (AAA) screening. You may need this if you are a current or former smoker. Osteoporosis. You may be screened starting at age 34 if you are at high risk. Talk with your health  care provider about your test results, treatment options, and if necessary, the need for more tests. Vaccines  Your health care provider may recommend certain vaccines, such as: Influenza vaccine. This is recommended every year. Tetanus, diphtheria, and acellular pertussis (Tdap, Td) vaccine. You may need a Td booster every 10 years. Zoster vaccine. You may  need this after age 66. Pneumococcal 13-valent conjugate (PCV13) vaccine. One dose is recommended after age 67. Pneumococcal polysaccharide (PPSV23) vaccine. One dose is recommended after age 24. Talk to your health care provider about which screenings and vaccines you need and how often you need them. This information is not intended to replace advice given to you by your health care provider. Make sure you discuss any questions you have with your health care provider. Document Released: 09/10/2015 Document Revised: 05/03/2016 Document Reviewed: 06/15/2015 Elsevier Interactive Patient Education  2017 [place] Prevention in the Home Falls can cause injuries. They can happen to people of all ages. There are many things you can do to make your home safe and to help prevent falls. What can I do on the outside of my home? Regularly fix the edges of walkways and driveways and fix any cracks. Remove anything that might make you trip as you walk through a door, such as a raised step or threshold. Trim any bushes or trees on the path to your home. Use bright outdoor lighting. Clear any walking paths of anything that might make someone trip, such as rocks or tools. Regularly check to see if handrails are loose or broken. Make sure that both sides of any steps have handrails. Any raised decks and porches should have guardrails on the edges. Have any leaves, snow, or ice cleared regularly. Use sand or salt on walking paths during winter. Clean up any spills in your garage right away. This includes oil or grease spills. What can I do in the bathroom? Use night lights. Install grab bars by the toilet and in the tub and shower. Do not use towel bars as grab bars. Use non-skid mats or decals in the tub or shower. If you need to sit down in the shower, use a plastic, non-slip stool. Keep the floor dry. Clean up any water that spills on the floor as soon as it happens. Remove soap buildup in  the tub or shower regularly. Attach bath mats securely with double-sided non-slip rug tape. Do not have throw rugs and other things on the floor that can make you trip. What can I do in the bedroom? Use night lights. Make sure that you have a light by your bed that is easy to reach. Do not use any sheets or blankets that are too big for your bed. They should not hang down onto the floor. Have a firm chair that has side arms. You can use this for support while you get dressed. Do not have throw rugs and other things on the floor that can make you trip. What can I do in the kitchen? Clean up any spills right away. Avoid walking on wet floors. Keep items that you use a lot in easy-to-reach places. If you need to reach something above you, use a strong step stool that has a grab bar. Keep electrical cords out of the way. Do not use floor polish or wax that makes floors slippery. If you must use wax, use non-skid floor wax. Do not have throw rugs and other things on the floor that can make you trip. What can  I do with my stairs? Do not leave any items on the stairs. Make sure that there are handrails on both sides of the stairs and use them. Fix handrails that are broken or loose. Make sure that handrails are as long as the stairways. Check any carpeting to make sure that it is firmly attached to the stairs. Fix any carpet that is loose or worn. Avoid having throw rugs at the top or bottom of the stairs. If you do have throw rugs, attach them to the floor with carpet tape. Make sure that you have a light switch at the top of the stairs and the bottom of the stairs. If you do not have them, ask someone to add them for you. What else can I do to help prevent falls? Wear shoes that: Do not have high heels. Have rubber bottoms. Are comfortable and fit you well. Are closed at the toe. Do not wear sandals. If you use a stepladder: Make sure that it is fully opened. Do not climb a closed  stepladder. Make sure that both sides of the stepladder are locked into place. Ask someone to hold it for you, if possible. Clearly mark and make sure that you can see: Any grab bars or handrails. First and last steps. Where the edge of each step is. Use tools that help you move around (mobility aids) if they are needed. These include: Canes. Walkers. Scooters. Crutches. Turn on the lights when you go into a dark area. Replace any light bulbs as soon as they burn out. Set up your furniture so you have a clear path. Avoid moving your furniture around. If any of your floors are uneven, fix them. If there are any pets around you, be aware of where they are. Review your medicines with your doctor. Some medicines can make you feel dizzy. This can increase your chance of falling. Ask your doctor what other things that you can do to help prevent falls. This information is not intended to replace advice given to you by your health care provider. Make sure you discuss any questions you have with your health care provider. Document Released: 06/10/2009 Document Revised: 01/20/2016 Document Reviewed: 09/18/2014 Elsevier Interactive Patient Education  2017 [employer].

## 2022-10-05 DIAGNOSIS — H40029 Open angle with borderline findings, high risk, unspecified eye: Secondary | ICD-10-CM | POA: Diagnosis not present

## 2022-10-05 DIAGNOSIS — H43393 Other vitreous opacities, bilateral: Secondary | ICD-10-CM | POA: Diagnosis not present

## 2022-10-05 DIAGNOSIS — H43819 Vitreous degeneration, unspecified eye: Secondary | ICD-10-CM | POA: Diagnosis not present

## 2022-10-18 ENCOUNTER — Encounter (INDEPENDENT_AMBULATORY_CARE_PROVIDER_SITE_OTHER): Payer: Medicare Other | Admitting: Ophthalmology

## 2022-10-18 ENCOUNTER — Telehealth: Payer: Self-pay | Admitting: Family Medicine

## 2022-10-18 DIAGNOSIS — H43813 Vitreous degeneration, bilateral: Secondary | ICD-10-CM

## 2022-10-18 DIAGNOSIS — H353122 Nonexudative age-related macular degeneration, left eye, intermediate dry stage: Secondary | ICD-10-CM | POA: Diagnosis not present

## 2022-10-18 DIAGNOSIS — H353111 Nonexudative age-related macular degeneration, right eye, early dry stage: Secondary | ICD-10-CM

## 2022-10-18 DIAGNOSIS — I10 Essential (primary) hypertension: Secondary | ICD-10-CM | POA: Diagnosis not present

## 2022-10-18 DIAGNOSIS — H34212 Partial retinal artery occlusion, left eye: Secondary | ICD-10-CM

## 2022-10-18 DIAGNOSIS — H34232 Retinal artery branch occlusion, left eye: Secondary | ICD-10-CM | POA: Insufficient documentation

## 2022-10-18 DIAGNOSIS — H35033 Hypertensive retinopathy, bilateral: Secondary | ICD-10-CM

## 2022-10-18 NOTE — Telephone Encounter (Signed)
Received a call from Dr. Tempie Hoist (Ophthalmology). He saw Billy Stone today related to a 6 day history of acute partial vision loss in the left eye. 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   Billy Stone has a history of atrial fibrillation. He has not been on anticoagulation in the past due to concerns regarding cirrhosis and chronic alcoholism. However, Billy Stone has had ongoing sobriety now for the past 3 years. His cirrhosis has been compensated. 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 I will also refer him back to cardiology to consider options for management of stroke risk related to his atrial fibrillation. At the least, I feel it would be reasonable for Billy Stone to take a daily 81 mg e.c. aspirin. Dr. Zigmund Daniel has advised this.      Relevant Orders   MR Angiogram Head Wo Contrast   MR Angiogram Neck W Wo Contrast   Ambulatory referral to Cardiology

## 2022-10-18 NOTE — Telephone Encounter (Signed)
Patient notified of the referral for Cardiology and the MRA.  He will let us know if they don't get in touch with him in the next week. Dm/cma

## 2022-10-18 NOTE — Assessment & Plan Note (Signed)
I will refer Billy Stone for an MRA of the head and neck to evaluate for significant carotid disease and other evidence of thromboembolic issues. I will also refer him back to cardiology to consider options for management of stroke risk related to his atrial fibrillation. At the least, I feel it would be reasonable for [name] to take a daily 81 mg e.c. aspirin. Dr. Zigmund Daniel has advised this.

## 2022-10-27 ENCOUNTER — Telehealth: Payer: Self-pay | Admitting: Cardiology

## 2022-10-27 DIAGNOSIS — K7031 Alcoholic cirrhosis of liver with ascites: Secondary | ICD-10-CM

## 2022-10-27 NOTE — Telephone Encounter (Signed)
*  STAT* If patient is at the pharmacy, call can be transferred to refill team.   1. Which medications need to be refilled? (please list name of each medication and dose if known) Furosemide, Isosorbide, Metoprolol, Omeprazole, and Sertraline  2. Which pharmacy/location (including street and city if local pharmacy) is medication to be sent to?[facility]  3. Do they need a 30 day or 90 day supply? 90 days and refills

## 2022-10-30 MED ORDER — METOPROLOL TARTRATE 50 MG PO TABS: ORAL_TABLET | ORAL | 1 refills | Status: DC

## 2022-10-30 MED ORDER — ISOSORBIDE MONONITRATE ER 30 MG PO TB24: 30.0000 mg | ORAL_TABLET | Freq: Every day | ORAL | 10 refills | Status: DC

## 2022-10-30 MED ORDER — OMEPRAZOLE 20 MG PO CPDR: DELAYED_RELEASE_CAPSULE | ORAL | 10 refills | Status: DC

## 2022-10-30 MED ORDER — FUROSEMIDE 20 MG PO TABS: ORAL_TABLET | ORAL | 10 refills | Status: DC

## 2022-10-31 ENCOUNTER — Ambulatory Visit: Payer: Medicare Other | Attending: Nurse Practitioner | Admitting: Nurse Practitioner

## 2022-10-31 ENCOUNTER — Encounter: Payer: Self-pay | Admitting: Nurse Practitioner

## 2022-10-31 VITALS — BP 136/68 | HR 44 | Ht 64.0 in | Wt 142.0 lb

## 2022-10-31 DIAGNOSIS — I77819 Aortic ectasia, unspecified site: Secondary | ICD-10-CM

## 2022-10-31 DIAGNOSIS — R001 Bradycardia, unspecified: Secondary | ICD-10-CM

## 2022-10-31 DIAGNOSIS — I2510 Atherosclerotic heart disease of native coronary artery without angina pectoris: Secondary | ICD-10-CM | POA: Diagnosis not present

## 2022-10-31 DIAGNOSIS — H34232 Retinal artery branch occlusion, left eye: Secondary | ICD-10-CM | POA: Diagnosis not present

## 2022-10-31 DIAGNOSIS — I480 Paroxysmal atrial fibrillation: Secondary | ICD-10-CM

## 2022-10-31 DIAGNOSIS — I493 Ventricular premature depolarization: Secondary | ICD-10-CM

## 2022-10-31 DIAGNOSIS — I10 Essential (primary) hypertension: Secondary | ICD-10-CM

## 2022-10-31 DIAGNOSIS — E785 Hyperlipidemia, unspecified: Secondary | ICD-10-CM | POA: Diagnosis not present

## 2022-10-31 DIAGNOSIS — I471 Supraventricular tachycardia, unspecified: Secondary | ICD-10-CM

## 2022-10-31 DIAGNOSIS — I350 Nonrheumatic aortic (valve) stenosis: Secondary | ICD-10-CM

## 2022-10-31 DIAGNOSIS — K7460 Unspecified cirrhosis of liver: Secondary | ICD-10-CM | POA: Diagnosis not present

## 2022-10-31 NOTE — Patient Instructions (Signed)
Medication Instructions:  Your physician recommends that you continue on your current medications as directed. Please refer to the Current Medication list given to you today.   *If you need a refill on your cardiac medications before your next appointment, please call your pharmacy*   Lab Work: Your physician recommends that you complete lab work today CBC, BMET, Pt/INR    If you have labs (blood work) drawn today and your tests are completely normal, you will receive your results only by: [place] (if you have [place]) OR A paper copy in the mail If you have any lab test that is abnormal or we need to change your treatment, we will call you to review the results.   Testing/Procedures: NONE ordered at this time of appointment     Follow-Up: At [facility], you and your health needs are our priority.  As part of our continuing mission to provide you with exceptional heart care, we have created designated Provider Care Teams.  These Care Teams include your primary Cardiologist (physician) and Advanced Practice Providers (APPs -  Physician Assistants and Nurse Practitioners) who all work together to provide you with the care you need, when you need it.  We recommend signing up for the patient portal called "MyChart".  Sign up information is provided on this After Visit Summary.  MyChart is used to connect with patients for Virtual Visits (Telemedicine).  Patients are able to view lab/test results, encounter notes, upcoming appointments, etc.  Non-urgent messages can be sent to your provider as well.   To learn more about what you can do with MyChart, go to [email or web address].    Your next appointment:   3-4 month(s)  Provider:   Donato Heinz, MD     Other Instructions
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
## 2022-11-01 LAB — CBC
Hematocrit: 35.1 % — ABNORMAL LOW (ref 37.5–51.0)
Hemoglobin: 11.9 g/dL — ABNORMAL LOW (ref 13.0–17.7)
MCH: 29.5 pg (ref 26.6–33.0)
MCHC: 33.9 g/dL (ref 31.5–35.7)
MCV: 87 fL (ref 79–97)
Platelets: 258 10*3/uL (ref 150–450)
RBC: 4.04 x10E6/uL — ABNORMAL LOW (ref 4.14–5.80)
RDW: 13.7 % (ref 11.6–15.4)
WBC: 9.3 10*3/uL (ref 3.4–10.8)

## 2022-11-01 LAB — BASIC METABOLIC PANEL
BUN/Creatinine Ratio: 19 (ref 10–24)
BUN: 14 mg/dL (ref 8–27)
CO2: 20 mmol/L (ref 20–29)
Calcium: 9.3 mg/dL (ref 8.6–10.2)
Chloride: 100 mmol/L (ref 96–106)
Creatinine, Ser: 0.75 mg/dL — ABNORMAL LOW (ref 0.76–1.27)
Glucose: 108 mg/dL — ABNORMAL HIGH (ref 70–99)
Potassium: 4.7 mmol/L (ref 3.5–5.2)
Sodium: 137 mmol/L (ref 134–144)
eGFR: 95 mL/min/{1.73_m2} (ref >59)

## 2022-11-02 ENCOUNTER — Telehealth: Payer: Self-pay | Admitting: *Deleted

## 2022-11-02 MED ORDER — APIXABAN 5 MG PO TABS: 5.0000 mg | ORAL_TABLET | Freq: Two times a day (BID) | ORAL | 3 refills | Status: DC

## 2022-11-02 NOTE — Telephone Encounter (Signed)
-----   Message from Donato Heinz, MD sent at 11/01/2022  5:00 PM EST ----- Thanks Raquel Sarna.  I messaged Dr Candis Schatz and he was OK with starting Eliquis.    Yarlin Breisch, can we start him on Eliquis 5 mg BID?  Thanks, Gerald Stabs  ----- Message ----- From: Lenna Sciara, NP Sent: 10/31/2022   7:43 PM EST To: Donato Heinz, MD  Thank you for all of your help today. Dr. Candis Schatz is his GI, per notes overdue for variceal screening/EGD. You mentioned reaching out to him to discuss possibility of DOAC (possibly Eliquis) given PAF/L retinal artery occlusion and h/o GI bleed. I appreciate your recommendations. Thanks, Raquel Sarna

## 2022-11-02 NOTE — Telephone Encounter (Signed)
Discussed with Dr. Cranford Mon MD stop asa.    Patient aware.

## 2022-11-02 NOTE — Telephone Encounter (Signed)
Spoke to patient, aware of recommendations.  Rx sent to pharmacy and patient is aware to get free 30 day card online.    He is also taking asa 81 mg daily (not on med list).  Will send to MD to review.

## 2022-11-03 ENCOUNTER — Telehealth: Payer: Self-pay

## 2022-11-03 NOTE — Telephone Encounter (Signed)
Spoke with pt. Pt was notified of his lab results. Pt will start Eliquis as directed per Dr. Gardiner Rhyme. Medication was sent in yesterday by Dr. Gardiner Rhyme. Pt can continue his current medication and f/u as planned.

## 2022-11-07 ENCOUNTER — Other Ambulatory Visit: Payer: Medicare Other

## 2022-11-07 ENCOUNTER — Telehealth: Payer: Self-pay | Admitting: Cardiology

## 2022-11-07 NOTE — Telephone Encounter (Signed)
*  STAT* If patient is at the pharmacy, call can be transferred to refill team.   1. Which medications need to be refilled? (please list name of each medication and dose if known) sertraline (ZOLOFT) 50 MG tablet   2. Which pharmacy/location (including street and city if local pharmacy) is medication to be sent to?   [place]    3. Do they need a 30 day or 90 day supply? [place]

## 2022-11-08 NOTE — Telephone Encounter (Signed)
This is not a cardiac med, defer to PCP

## 2022-11-09 ENCOUNTER — Telehealth: Payer: Self-pay | Admitting: Cardiology

## 2022-11-09 NOTE — Telephone Encounter (Signed)
Spoke to Billy Stone. He states he thought the medication was going to be covered. Asked Billy Stone did he receive a free 30 day trial card or a Billy Stone assistance form? He states no. Printed out the form, got a card from the samples closet and gave to [name] to be signed by Dr. Gardiner Rhyme. Billy Stone will come by the office to get paperwork and card.

## 2022-11-09 NOTE — Telephone Encounter (Signed)
Pt c/o medication issue:  1. Name of Medication: apixaban (ELIQUIS) 5 MG TABS tablet   2. How are you currently taking this medication (dosage and times per day)?    3. Are you having a reaction (difficulty breathing--STAT)?  No   4. What is your medication issue? Patient states that he is suppose to be getting assistant on getting this medication. He states its too expensive for him. Please advise

## 2022-11-13 ENCOUNTER — Ambulatory Visit
Admission: RE | Admit: 2022-11-13 | Discharge: 2022-11-13 | Disposition: A | Discharge status: Home / Self Care | Payer: Medicare Other | Source: Ambulatory Visit | Attending: Family Medicine | Admitting: Family Medicine

## 2022-11-13 DIAGNOSIS — H34232 Retinal artery branch occlusion, left eye: Secondary | ICD-10-CM

## 2022-11-13 DIAGNOSIS — I6622 Occlusion and stenosis of left posterior cerebral artery: Secondary | ICD-10-CM | POA: Diagnosis not present

## 2022-11-13 DIAGNOSIS — I771 Stricture of artery: Secondary | ICD-10-CM | POA: Diagnosis not present

## 2022-11-13 MED ORDER — GADOPICLENOL 0.5 MMOL/ML IV SOLN
7.0000 mL | Freq: Once | INTRAVENOUS | Status: AC | PRN
  Administered 2022-11-13: 7 mL via INTRAVENOUS

## 2022-11-14 ENCOUNTER — Ambulatory Visit (INDEPENDENT_AMBULATORY_CARE_PROVIDER_SITE_OTHER): Payer: Medicare Other | Admitting: Family Medicine

## 2022-11-14 ENCOUNTER — Encounter: Payer: Self-pay | Admitting: Family Medicine

## 2022-11-14 VITALS — BP 160/70 | HR 53 | Temp 98.1°F | Ht 64.0 in | Wt 136.6 lb

## 2022-11-14 DIAGNOSIS — F339 Major depressive disorder, recurrent, unspecified: Secondary | ICD-10-CM | POA: Insufficient documentation

## 2022-11-14 DIAGNOSIS — H34232 Retinal artery branch occlusion, left eye: Secondary | ICD-10-CM | POA: Diagnosis not present

## 2022-11-14 DIAGNOSIS — I10 Essential (primary) hypertension: Secondary | ICD-10-CM

## 2022-11-14 DIAGNOSIS — E782 Mixed hyperlipidemia: Secondary | ICD-10-CM

## 2022-11-14 DIAGNOSIS — I4891 Unspecified atrial fibrillation: Secondary | ICD-10-CM | POA: Diagnosis not present

## 2022-11-14 DIAGNOSIS — I25119 Atherosclerotic heart disease of native coronary artery with unspecified angina pectoris: Secondary | ICD-10-CM | POA: Diagnosis not present

## 2022-11-14 LAB — LIPID PANEL
Cholesterol: 109 mg/dL (ref 0–200)
HDL: 37.6 mg/dL — ABNORMAL LOW (ref >39.00)
LDL Cholesterol: 57 mg/dL (ref 0–99)
NonHDL: 71.12
Total CHOL/HDL Ratio: 3
Triglycerides: 72 mg/dL (ref 0.0–149.0)
VLDL: 14.4 mg/dL (ref 0.0–40.0)

## 2022-11-14 MED ORDER — ROSUVASTATIN CALCIUM 5 MG PO TABS: 5.0000 mg | ORAL_TABLET | Freq: Every day | ORAL | 3 refills | Status: DC

## 2022-11-14 MED ORDER — SERTRALINE HCL 50 MG PO TABS: 50.0000 mg | ORAL_TABLET | Freq: Every day | ORAL | 3 refills | Status: DC

## 2022-11-14 NOTE — Assessment & Plan Note (Signed)
Stable.  Continue sertraline 50 mg daily.

## 2022-11-14 NOTE — Assessment & Plan Note (Signed)
In sinus rhythm most recently. Now on apixaban 5 mg bid.

## 2022-11-14 NOTE — Assessment & Plan Note (Signed)
Following with Dr. Zigmund Daniel. Results of MR angiogram pending.

## 2022-11-14 NOTE — Assessment & Plan Note (Signed)
Stable. Continue Imdur 30 mg daily, Lasix 20 mg daily, and metoprolol 50 mg bid.

## 2022-11-14 NOTE — Assessment & Plan Note (Signed)
Blood pressure is elevated today, but Billy Stone did not take his medication prior to his appointment. He should resume his metoprolol tartrate 50 mg bid. I urged him to always take this, esp. prior to his appointments.
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
## 2022-11-15 ENCOUNTER — Encounter (INDEPENDENT_AMBULATORY_CARE_PROVIDER_SITE_OTHER): Payer: Medicare Other | Admitting: Ophthalmology

## 2022-11-15 DIAGNOSIS — H43813 Vitreous degeneration, bilateral: Secondary | ICD-10-CM

## 2022-11-15 DIAGNOSIS — H34232 Retinal artery branch occlusion, left eye: Secondary | ICD-10-CM | POA: Diagnosis not present

## 2022-11-15 DIAGNOSIS — H34212 Partial retinal artery occlusion, left eye: Secondary | ICD-10-CM | POA: Diagnosis not present

## 2022-11-15 DIAGNOSIS — H353111 Nonexudative age-related macular degeneration, right eye, early dry stage: Secondary | ICD-10-CM | POA: Diagnosis not present

## 2022-11-15 DIAGNOSIS — I10 Essential (primary) hypertension: Secondary | ICD-10-CM

## 2022-11-15 DIAGNOSIS — H35033 Hypertensive retinopathy, bilateral: Secondary | ICD-10-CM | POA: Diagnosis not present

## 2022-11-16 ENCOUNTER — Other Ambulatory Visit: Payer: Self-pay | Admitting: Family Medicine

## 2022-11-16 ENCOUNTER — Telehealth: Payer: Self-pay | Admitting: Cardiology

## 2022-11-16 ENCOUNTER — Encounter: Payer: Self-pay | Admitting: Family Medicine

## 2022-11-16 DIAGNOSIS — I6522 Occlusion and stenosis of left carotid artery: Secondary | ICD-10-CM

## 2022-11-16 DIAGNOSIS — H34232 Retinal artery branch occlusion, left eye: Secondary | ICD-10-CM

## 2022-11-16 NOTE — Telephone Encounter (Signed)
Called spoke to patient. He states he is taking 5 mg Eliquis  twice a day .   RN informed patient will send message to Dr Gardiner Rhyme. Dr Gardiner Rhyme is out of the office today

## 2022-11-16 NOTE — Telephone Encounter (Signed)
Pt c/o medication issue:  1. Name of Medication: Aspirin  2. How are you currently taking this medication (dosage and times per day)? Patient stopped taking  3. Are you having a reaction (difficulty breathing--STAT)?   4. What is your medication issue?    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  Patient stated he had stopped taking aspirin and is now taking Eliquis.  Patient wants to know if he should continue taking aspirin as he needs to let his retina specialist know.

## 2022-11-17 NOTE — Telephone Encounter (Signed)
Spoke to patient, aware of recommendations.

## 2022-11-17 NOTE — Telephone Encounter (Signed)
Does not need ASA, can just take Eliquis

## 2022-11-28 ENCOUNTER — Ambulatory Visit (HOSPITAL_COMMUNITY): Payer: Medicare Other | Attending: Cardiology

## 2022-11-28 DIAGNOSIS — K7460 Unspecified cirrhosis of liver: Secondary | ICD-10-CM | POA: Insufficient documentation

## 2022-11-28 DIAGNOSIS — I10 Essential (primary) hypertension: Secondary | ICD-10-CM | POA: Insufficient documentation

## 2022-11-28 DIAGNOSIS — I480 Paroxysmal atrial fibrillation: Secondary | ICD-10-CM

## 2022-11-28 DIAGNOSIS — I350 Nonrheumatic aortic (valve) stenosis: Secondary | ICD-10-CM | POA: Diagnosis not present

## 2022-11-28 DIAGNOSIS — I77819 Aortic ectasia, unspecified site: Secondary | ICD-10-CM

## 2022-11-28 DIAGNOSIS — I25119 Atherosclerotic heart disease of native coronary artery with unspecified angina pectoris: Secondary | ICD-10-CM | POA: Diagnosis not present

## 2022-11-28 DIAGNOSIS — I493 Ventricular premature depolarization: Secondary | ICD-10-CM | POA: Diagnosis not present

## 2022-11-28 LAB — ECHOCARDIOGRAM COMPLETE
AR max vel: 0.79 cm2
AV Area VTI: 0.82 cm2
AV Area mean vel: 0.79 cm2
AV Mean grad: 15 mmHg
AV Peak grad: 27.7 mmHg
Ao pk vel: 2.63 m/s
Area-P 1/2: 2.49 cm2
P 1/2 time: 599 msec
S' Lateral: 3.2 cm

## 2022-12-05 ENCOUNTER — Other Ambulatory Visit: Payer: Self-pay | Admitting: *Deleted

## 2022-12-05 DIAGNOSIS — I6529 Occlusion and stenosis of unspecified carotid artery: Secondary | ICD-10-CM

## 2022-12-11 ENCOUNTER — Other Ambulatory Visit: Payer: Self-pay | Admitting: Gastroenterology

## 2022-12-12 ENCOUNTER — Ambulatory Visit (INDEPENDENT_AMBULATORY_CARE_PROVIDER_SITE_OTHER): Payer: Medicare Other | Admitting: Vascular Surgery

## 2022-12-12 ENCOUNTER — Ambulatory Visit (HOSPITAL_COMMUNITY)
Admission: RE | Admit: 2022-12-12 | Discharge: 2022-12-12 | Disposition: A | Discharge status: Home / Self Care | Payer: Medicare Other | Source: Ambulatory Visit | Attending: Vascular Surgery | Admitting: Vascular Surgery

## 2022-12-12 VITALS — BP 153/71 | HR 67 | Temp 97.7°F | Resp 16 | Ht 63.5 in | Wt 132.0 lb

## 2022-12-12 DIAGNOSIS — I6522 Occlusion and stenosis of left carotid artery: Secondary | ICD-10-CM

## 2022-12-12 DIAGNOSIS — I6529 Occlusion and stenosis of unspecified carotid artery: Secondary | ICD-10-CM | POA: Insufficient documentation

## 2022-12-12 DIAGNOSIS — H34232 Retinal artery branch occlusion, left eye: Secondary | ICD-10-CM
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
## 2023-01-01 ENCOUNTER — Other Ambulatory Visit: Payer: Self-pay

## 2023-01-01 DIAGNOSIS — H34232 Retinal artery branch occlusion, left eye: Secondary | ICD-10-CM

## 2023-01-04 ENCOUNTER — Telehealth: Payer: Self-pay | Admitting: Vascular Surgery

## 2023-01-04 NOTE — Telephone Encounter (Signed)
Patient wants to wait on CT due to needing to take care of his mother.

## 2023-01-09 ENCOUNTER — Ambulatory Visit: Payer: Medicare HMO | Admitting: Vascular Surgery

## 2023-02-13 ENCOUNTER — Encounter (INDEPENDENT_AMBULATORY_CARE_PROVIDER_SITE_OTHER): Payer: Medicare HMO | Admitting: Ophthalmology

## 2023-02-13 DIAGNOSIS — H34232 Retinal artery branch occlusion, left eye: Secondary | ICD-10-CM | POA: Diagnosis not present

## 2023-02-13 DIAGNOSIS — I10 Essential (primary) hypertension: Secondary | ICD-10-CM

## 2023-02-13 DIAGNOSIS — H353112 Nonexudative age-related macular degeneration, right eye, intermediate dry stage: Secondary | ICD-10-CM

## 2023-02-13 DIAGNOSIS — H35033 Hypertensive retinopathy, bilateral: Secondary | ICD-10-CM

## 2023-02-13 DIAGNOSIS — H34212 Partial retinal artery occlusion, left eye: Secondary | ICD-10-CM
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
## 2023-02-27 ENCOUNTER — Telehealth: Payer: Self-pay | Admitting: Vascular Surgery

## 2023-02-27 ENCOUNTER — Ambulatory Visit (INDEPENDENT_AMBULATORY_CARE_PROVIDER_SITE_OTHER): Payer: Medicare HMO

## 2023-02-27 ENCOUNTER — Encounter: Payer: Self-pay | Admitting: Cardiology

## 2023-02-27 ENCOUNTER — Ambulatory Visit: Payer: Medicare HMO | Attending: Cardiology | Admitting: Cardiology

## 2023-02-27 VITALS — BP 140/60 | HR 66 | Ht 63.0 in | Wt 132.6 lb

## 2023-02-27 DIAGNOSIS — Z79899 Other long term (current) drug therapy: Secondary | ICD-10-CM | POA: Diagnosis not present

## 2023-02-27 DIAGNOSIS — I2510 Atherosclerotic heart disease of native coronary artery without angina pectoris: Secondary | ICD-10-CM

## 2023-02-27 DIAGNOSIS — I77819 Aortic ectasia, unspecified site: Secondary | ICD-10-CM | POA: Diagnosis not present

## 2023-02-27 DIAGNOSIS — I10 Essential (primary) hypertension: Secondary | ICD-10-CM

## 2023-02-27 DIAGNOSIS — I6529 Occlusion and stenosis of unspecified carotid artery: Secondary | ICD-10-CM

## 2023-02-27 DIAGNOSIS — I480 Paroxysmal atrial fibrillation: Secondary | ICD-10-CM | POA: Diagnosis not present

## 2023-02-27 DIAGNOSIS — I350 Nonrheumatic aortic (valve) stenosis: Secondary | ICD-10-CM | POA: Diagnosis not present

## 2023-02-27 LAB — CBC WITH DIFFERENTIAL/PLATELET

## 2023-02-27 LAB — COMPREHENSIVE METABOLIC PANEL
ALT: 14 IU/L (ref 0–44)
Alkaline Phosphatase: 144 IU/L — ABNORMAL HIGH (ref 44–121)
Bilirubin Total: 0.6 mg/dL (ref 0.0–1.2)
Glucose: 111 mg/dL — ABNORMAL HIGH (ref 70–99)
Potassium: 4.3 mmol/L (ref 3.5–5.2)

## 2023-02-27 MED ORDER — METOPROLOL SUCCINATE ER 100 MG PO TB24: 100.0000 mg | ORAL_TABLET | Freq: Every day | ORAL | 3 refills | Status: DC

## 2023-02-27 NOTE — Telephone Encounter (Signed)
calling to schedule ct appt and r/s md visit

## 2023-02-27 NOTE — Progress Notes (Unsigned)
Enrolled patient for a 14 day Zio XT monitor to be mailed to patients home  [id number] returned with only 0.679 days of data. Patient being mailed a replacement monitor. Reports on both monitors will be combined.

## 2023-02-27 NOTE — Patient Instructions (Addendum)
Medication Instructions:  Your physician has recommended you make the following change in your medication:  STOP: Metoprolol Tartrate (Lopressor)  START: Metoprolol Succinate (Toprol-XL) 100 mg once daily   Please take your blood pressure twice daily for 1 week and call the office to give the readings. Please include heart rates.   HOW TO TAKE YOUR BLOOD PRESSURE: Rest 5 minutes before taking your blood pressure. Don't smoke or drink caffeinated beverages for at least 30 minutes before. Take your blood pressure before (not after) you eat. Sit comfortably with your back supported and both feet on the floor (don't cross your legs). Elevate your arm to heart level on a table or a desk. Use the proper sized cuff. It should fit smoothly and snugly around your bare upper arm. There should be enough room to slip a fingertip under the cuff. The bottom edge of the cuff should be 1 inch above the crease of the elbow. Ideally, take 3 measurements at one sitting and record the average.  *If you need a refill on your cardiac medications before your next appointment, please call your pharmacy*   Lab Work: Your physician recommends that you have labs drawn today: CMET, CBC If you have labs (blood work) drawn today and your tests are completely normal, you will receive your results only by: MyChart Message (if you have MyChart) OR A paper copy in the mail If you have any lab test that is abnormal or we need to change your treatment, we will call you to review the results.   Testing/Procedures: Your physician has requested that you have an echocardiogram in October. Echocardiography is a painless test that uses sound waves to create images of your heart. It provides your doctor with information about the size and shape of your heart and how well your heart's chambers and valves are working. This procedure takes approximately one hour. There are no restrictions for this procedure. Please do NOT wear  cologne, perfume, aftershave, or lotions (deodorant is allowed). Please arrive 15 minutes prior to your appointment time.    Follow-Up: At [facility], you and your health needs are our priority.  As part of our continuing mission to provide you with exceptional heart care, we have created designated Provider Care Teams.  These Care Teams include your primary Cardiologist (physician) and Advanced Practice Providers (APPs -  Physician Assistants and Nurse Practitioners) who all work together to provide you with the care you need, when you need it.    Your next appointment:   4 month(s)  Provider:   Little Ishikawa, MD     Other Instructions Please reach out to your GI provider (Dr.  Tomasa Rand) to set up an appointment.

## 2023-02-28 LAB — COMPREHENSIVE METABOLIC PANEL
AST: 26 IU/L (ref 0–40)
Albumin: 3.9 g/dL (ref 3.8–4.8)
BUN/Creatinine Ratio: 19 (ref 10–24)
BUN: 14 mg/dL (ref 8–27)
CO2: 22 mmol/L (ref 20–29)
Calcium: 9.4 mg/dL (ref 8.6–10.2)
Chloride: 100 mmol/L (ref 96–106)
Creatinine, Ser: 0.74 mg/dL — ABNORMAL LOW (ref 0.76–1.27)
Globulin, Total: 3.8 g/dL (ref 1.5–4.5)
Sodium: 137 mmol/L (ref 134–144)
Total Protein: 7.7 g/dL (ref 6.0–8.5)
eGFR: 95 mL/min/{1.73_m2} (ref >59)

## 2023-02-28 LAB — CBC WITH DIFFERENTIAL/PLATELET
Basophils Absolute: 0.1 10*3/uL (ref 0.0–0.2)
Basos: 1 %
EOS (ABSOLUTE): 0.5 10*3/uL — ABNORMAL HIGH (ref 0.0–0.4)
Eos: 5 %
Hematocrit: 36 % — ABNORMAL LOW (ref 37.5–51.0)
MCH: 30.2 pg (ref 26.6–33.0)
MCHC: 34.4 g/dL (ref 31.5–35.7)
MCV: 88 fL (ref 79–97)
Monocytes Absolute: 0.8 10*3/uL (ref 0.1–0.9)
Monocytes: 9 %
Neutrophils Absolute: 3.5 10*3/uL (ref 1.4–7.0)
Platelets: 283 10*3/uL (ref 150–450)
WBC: 9.4 10*3/uL (ref 3.4–10.8)

## 2023-03-02 DIAGNOSIS — I480 Paroxysmal atrial fibrillation: Secondary | ICD-10-CM

## 2023-03-09 ENCOUNTER — Telehealth: Payer: Self-pay | Admitting: Cardiology

## 2023-03-09 NOTE — Telephone Encounter (Signed)
Pt c/o BP issue: STAT if pt c/o blurred vision, one-sided weakness or slurred speech  1. What are your last 5 BP readings?  02/28/23 127/75 HR 59 11:30 AM 131/70 03/01/23 147/71 & 112/62  03/02/23 133/63 & 117/58 03/03/23 123/63 & 115/67 03/04/23 129/66 & 118/59 03/05/23 130/63 & 112/68 03/06/23 111/51 & 108/57  2. Are you having any other symptoms (ex. Dizziness, headache, blurred vision, passed out)? No symptoms   3. What is your BP issue? Was advised to record readings and report it.

## 2023-03-09 NOTE — Telephone Encounter (Signed)
Patient did not make a not if these are before or after his medications. No symptoms, he feels fine. Patient states there was a change to his medication  metoprolol to the XL but has not started it yet, has not received from mail order. Please advise if any changes

## 2023-03-13 NOTE — Telephone Encounter (Signed)
Spoke with pt, aware dr Bjorn Pippin is not making any changes related to his blood pressure.

## 2023-03-13 NOTE — Telephone Encounter (Signed)
BP looks okay, would not recommend any changes at this time

## 2023-03-29 ENCOUNTER — Telehealth: Payer: Self-pay | Admitting: *Deleted

## 2023-03-29 NOTE — Telephone Encounter (Signed)
Received notification from [facility].  They had received [id number] with only 0.679 leads on data. Patient called and given option of having redo monitor mailed to him or to schedule an appointment to have one applied at our office. Patient opted to have a new monitor mailed to him. [employer] notified to ship patient a new monitor and combine reports.

## 2023-04-04 ENCOUNTER — Telehealth: Payer: Self-pay | Admitting: Cardiology

## 2023-04-04 MED ORDER — METOPROLOL SUCCINATE ER 100 MG PO TB24: 100.0000 mg | ORAL_TABLET | Freq: Every day | ORAL | 3 refills | Status: DC

## 2023-04-04 NOTE — Telephone Encounter (Signed)
Pt c/o medication issue:  1. Name of Medication: metoprolol succinate (TOPROL XL) 100 MG 24 hr tablet    2. How are you currently taking this medication (dosage and times per day)? Take 1 tablet (100 mg total) by mouth daily. Take with or immediately following a meal.   3. Are you having a reaction (difficulty breathing--STAT)? No  4. What is your medication issue? Angelique Blonder from [facility] was calling because they are needing [place] to fax over a new prescription of the medication to [phone]. Please advise.

## 2023-04-04 NOTE — Telephone Encounter (Signed)
Called pharmacy to let them know that this will be sent.

## 2023-04-16 ENCOUNTER — Other Ambulatory Visit: Payer: Self-pay | Admitting: Gastroenterology

## 2023-04-19 DIAGNOSIS — I480 Paroxysmal atrial fibrillation: Secondary | ICD-10-CM | POA: Diagnosis not present

## 2023-04-27 ENCOUNTER — Ambulatory Visit: Payer: Medicare HMO | Admitting: Podiatry

## 2023-04-27 ENCOUNTER — Encounter: Payer: Self-pay | Admitting: Podiatry

## 2023-04-27 DIAGNOSIS — G6289 Other specified polyneuropathies: Secondary | ICD-10-CM

## 2023-04-27 DIAGNOSIS — M79675 Pain in left toe(s): Secondary | ICD-10-CM

## 2023-04-27 DIAGNOSIS — B351 Tinea unguium: Secondary | ICD-10-CM | POA: Diagnosis not present

## 2023-04-27 DIAGNOSIS — M79674 Pain in right toe(s): Secondary | ICD-10-CM

## 2023-04-27 NOTE — Progress Notes (Signed)
This patient presents to the office with chief complaint of long thick painful nails.  Patient says the nails are painful walking and wearing shoes.  This patient is unable to self treat.  This patient is unable to trim his nails since she is unable to reach his  nails.  he presents to the office for preventative foot care services.  General Appearance  Alert, conversant and in no acute stress.  Vascular  Dorsalis pedis and posterior tibial  pulses are palpable  bilaterally.  Capillary return is within normal limits  bilaterally. Temperature is within normal limits  bilaterally.  Neurologic  Senn-Weinstein monofilament wire test diminished  bilaterally. Muscle power within normal limits bilaterally.  Nails Thick disfigured discolored nails with subungual debris  from hallux to fifth toes bilaterally. No evidence of bacterial infection or drainage bilaterally.  Orthopedic  No limitations of motion  feet .  No crepitus or effusions noted.  No bony pathology or digital deformities noted.  HAV  B/L.  Skin  normotropic skin with no porokeratosis noted bilaterally.  No signs of infections or ulcers noted.     Onychomycosis  Nails  B/L.  Pain in right toes  Pain in left toes  Debridement of nails both feet followed trimming the nails with dremel tool.    RTC 6  months.   Helane Gunther DPM

## 2023-05-30 ENCOUNTER — Other Ambulatory Visit: Payer: Self-pay | Admitting: Cardiology

## 2023-05-30 DIAGNOSIS — I4891 Unspecified atrial fibrillation: Secondary | ICD-10-CM

## 2023-05-30 NOTE — Telephone Encounter (Signed)
Eliquis 5mg  refill request received. Patient is 72 years old, weight-60.1kg, Crea-0.74 on 02/27/23, Diagnosis-Afib, and last seen by Bjorn Pippin on 02/27/23. Dose is appropriate based on dosing criteria. Will send in refill to requested pharmacy.

## 2023-06-05 ENCOUNTER — Ambulatory Visit (HOSPITAL_COMMUNITY): Payer: Medicare HMO | Attending: Cardiology

## 2023-06-05 DIAGNOSIS — I350 Nonrheumatic aortic (valve) stenosis: Secondary | ICD-10-CM | POA: Insufficient documentation

## 2023-06-05 LAB — ECHOCARDIOGRAM COMPLETE
AR max vel: 0.79 cm2
AV Area VTI: 0.84 cm2
AV Area mean vel: 0.82 cm2
AV Mean grad: 17 mm[Hg]
AV Peak grad: 32.7 mm[Hg]
Ao pk vel: 2.86 m/s
Area-P 1/2: 2.87 cm2
P 1/2 time: 587 ms
S' Lateral: 2.8 cm
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
## 2023-06-25 ENCOUNTER — Encounter: Payer: Self-pay | Admitting: Cardiology

## 2023-06-25 ENCOUNTER — Ambulatory Visit: Payer: Medicare HMO | Attending: Cardiology | Admitting: Cardiology

## 2023-06-25 VITALS — BP 163/71 | Ht 63.0 in | Wt 145.6 lb

## 2023-06-25 DIAGNOSIS — I480 Paroxysmal atrial fibrillation: Secondary | ICD-10-CM

## 2023-06-25 DIAGNOSIS — K7030 Alcoholic cirrhosis of liver without ascites: Secondary | ICD-10-CM | POA: Diagnosis not present

## 2023-06-25 DIAGNOSIS — I2510 Atherosclerotic heart disease of native coronary artery without angina pectoris: Secondary | ICD-10-CM

## 2023-06-25 DIAGNOSIS — I5022 Chronic systolic (congestive) heart failure: Secondary | ICD-10-CM

## 2023-06-25 MED ORDER — LOSARTAN POTASSIUM 25 MG PO TABS: 25.0000 mg | ORAL_TABLET | Freq: Every day | ORAL | 3 refills | Status: DC

## 2023-06-25 NOTE — Patient Instructions (Addendum)
Medication Instructions:  Start Losartan 25 mg daily Continue all other medications *If you need a refill on your cardiac medications before your next appointment, please call your pharmacy*   Lab Work: Cmet,cbc today   Testing/Procedures: Cardiac MRI will be scheduled after approved by insurance     Follow instructions below   Follow-Up: At [facility], you and your health needs are our priority.  As part of our continuing mission to provide you with exceptional heart care, we have created designated Provider Care Teams.  These Care Teams include your primary Cardiologist (physician) and Advanced Practice Providers (APPs -  Physician Assistants and Nurse Practitioners) who all work together to provide you with the care you need, when you need it.  We recommend signing up for the patient portal called "MyChart".  Sign up information is provided on this After Visit Summary.  MyChart is used to connect with patients for Virtual Visits (Telemedicine).  Patients are able to view lab/test results, encounter notes, upcoming appointments, etc.  Non-urgent messages can be sent to your provider as well.   To learn more about what you can do with MyChart, go to [email or web address].    Your next appointment:  3 months     Provider:  Dr.Schumann        Your physician has requested that you have a cardiac MRI. Cardiac MRI uses a computer to create images of your heart as its beating, producing both still and moving pictures of your heart and major blood vessels. For further information please visit [email or web address]. Please follow the instruction sheet given to you today for more information.    You are scheduled for Cardiac MRI at the location below.  Please arrive for your appointment at ______________ . ?  [facility] [address] [place], [place] [address] [phone] Please take advantage of the free valet parking available at the [facility] and [employer] (Entrance C).  Proceed to the [facility] Radiology Department (First Floor) for check-in.   OR   [facility] [address] [place], [place] [address] [phone] Please go to the [facility] and check-in with the desk attendant.   Magnetic resonance imaging (MRI) is a painless test that produces images of the inside of the body without using Xrays.  During an MRI, strong magnets and radio waves work together in a Data processing manager to form detailed images.   MRI images may provide more details about a medical condition than X-rays, CT scans, and ultrasounds can provide.  You may be given earphones to listen for instructions.  You may eat a light breakfast and take medications as ordered with the exception of furosemide, hydrochlorothiazide, or spironolactone(fluid pill, other). If you are undergoing a stress MRI, please avoid stimulants for 12 hr prior to test. (Ie. Caffeine, nicotine, chocolate, or antihistamine medications)  An IV will be inserted into one of your veins. Contrast material will be injected into your IV. It will leave your body through your urine within a day. You may be told to drink plenty of fluids to help flush the contrast material out of your system.  You will be asked to remove all metal, including: Watch, jewelry, and other metal objects including hearing aids, hair pieces and dentures. Also wearable glucose monitoring systems (ie. Freestyle Libre and Omnipods) (Braces and fillings normally are not a problem.)   TEST WILL TAKE APPROXIMATELY 1 HOUR  PLEASE NOTIFY SCHEDULING AT LEAST 24 HOURS IN ADVANCE IF  YOU ARE UNABLE TO KEEP YOUR APPOINTMENT. [phone]  For more information and frequently asked questions, please visit our website : [email or web address]  Please call the Cardiac Imaging Nurse Navigators with any questions/concerns. [phone] Office

## 2023-06-26 LAB — COMPREHENSIVE METABOLIC PANEL
ALT: 11 [IU]/L (ref 0–44)
AST: 25 [IU]/L (ref 0–40)
Albumin: 3.7 g/dL — ABNORMAL LOW (ref 3.8–4.8)
Alkaline Phosphatase: 161 [IU]/L — ABNORMAL HIGH (ref 44–121)
BUN/Creatinine Ratio: 16 (ref 10–24)
BUN: 14 mg/dL (ref 8–27)
Bilirubin Total: 0.3 mg/dL (ref 0.0–1.2)
CO2: 17 mmol/L — ABNORMAL LOW (ref 20–29)
Calcium: 8.8 mg/dL (ref 8.6–10.2)
Chloride: 99 mmol/L (ref 96–106)
Creatinine, Ser: 0.85 mg/dL (ref 0.76–1.27)
Globulin, Total: 3.9 g/dL (ref 1.5–4.5)
Glucose: 94 mg/dL (ref 70–99)
Potassium: 4.3 mmol/L (ref 3.5–5.2)
Sodium: 137 mmol/L (ref 134–144)
Total Protein: 7.6 g/dL (ref 6.0–8.5)
eGFR: 91 mL/min/{1.73_m2} (ref >59)

## 2023-06-27 ENCOUNTER — Telehealth: Payer: Self-pay | Admitting: Family Medicine

## 2023-06-27 NOTE — Telephone Encounter (Signed)
Please review and advise. Thanks. Dm/cma  

## 2023-06-27 NOTE — Telephone Encounter (Signed)
Pt has received a summons for jury duty and would like to have a letter to excuse him. Please call if and when it is ready.  [place] [phone]

## 2023-06-28 NOTE — Telephone Encounter (Signed)
Patient notified VIA phone and letter placed up front for pickup. Dm/cma

## 2023-07-02 ENCOUNTER — Other Ambulatory Visit: Payer: Self-pay

## 2023-07-02 DIAGNOSIS — I10 Essential (primary) hypertension: Secondary | ICD-10-CM

## 2023-07-05 ENCOUNTER — Other Ambulatory Visit: Payer: Self-pay | Admitting: Cardiology

## 2023-07-05 DIAGNOSIS — K7031 Alcoholic cirrhosis of liver with ascites: Secondary | ICD-10-CM

## 2023-07-10 ENCOUNTER — Ambulatory Visit: Payer: Medicare HMO | Admitting: Cardiology

## 2023-07-17 DIAGNOSIS — I10 Essential (primary) hypertension: Secondary | ICD-10-CM | POA: Diagnosis not present

## 2023-07-18 ENCOUNTER — Encounter: Payer: Self-pay | Admitting: Cardiology

## 2023-07-18 LAB — BASIC METABOLIC PANEL
BUN/Creatinine Ratio: 13 (ref 10–24)
BUN: 12 mg/dL (ref 8–27)
CO2: 24 mmol/L (ref 20–29)
Calcium: 8.8 mg/dL (ref 8.6–10.2)
Chloride: 98 mmol/L (ref 96–106)
Creatinine, Ser: 0.89 mg/dL (ref 0.76–1.27)
Glucose: 118 mg/dL — ABNORMAL HIGH (ref 70–99)
Potassium: 4.1 mmol/L (ref 3.5–5.2)
Sodium: 135 mmol/L (ref 134–144)
eGFR: 90 mL/min/{1.73_m2} (ref >59)

## 2023-08-14 ENCOUNTER — Encounter (INDEPENDENT_AMBULATORY_CARE_PROVIDER_SITE_OTHER): Payer: Medicare HMO | Admitting: Ophthalmology

## 2023-08-30 ENCOUNTER — Telehealth (HOSPITAL_COMMUNITY): Payer: Self-pay | Admitting: Emergency Medicine

## 2023-08-30 NOTE — Telephone Encounter (Signed)
Reaching out to patient to offer assistance regarding upcoming cardiac imaging study; pt verbalizes understanding of appt date/time, parking situation and where to check in, pre-test NPO status and medications ordered, and verified current allergies; name and call back number provided for further questions should they arise Rockwell Alexandria RN Navigator Cardiac Imaging [facility] [phone] office [phone] cell   Denies metal  Denies claustro

## 2023-08-31 ENCOUNTER — Other Ambulatory Visit: Payer: Self-pay | Admitting: Cardiology

## 2023-08-31 ENCOUNTER — Ambulatory Visit (HOSPITAL_COMMUNITY)
Admission: RE | Admit: 2023-08-31 | Discharge: 2023-08-31 | Disposition: A | Discharge status: Home / Self Care | Payer: Medicare HMO | Source: Ambulatory Visit | Attending: Cardiology | Admitting: Cardiology

## 2023-08-31 DIAGNOSIS — I480 Paroxysmal atrial fibrillation: Secondary | ICD-10-CM

## 2023-08-31 DIAGNOSIS — I2510 Atherosclerotic heart disease of native coronary artery without angina pectoris: Secondary | ICD-10-CM

## 2023-08-31 MED ORDER — GADOBUTROL 1 MMOL/ML IV SOLN
9.0000 mL | Freq: Once | INTRAVENOUS | Status: AC | PRN
  Administered 2023-08-31: 9 mL via INTRAVENOUS

## 2023-09-03 ENCOUNTER — Telehealth: Payer: Self-pay | Admitting: *Deleted

## 2023-09-03 DIAGNOSIS — I350 Nonrheumatic aortic (valve) stenosis: Secondary | ICD-10-CM

## 2023-09-03 NOTE — Telephone Encounter (Signed)
 Called and results and recommendations given per Dr. Bjorn Pippin. Made patient aware that ECHO recommended and order  placed to be done in  April 2025. Patient verbalized understanding.

## 2023-09-11 ENCOUNTER — Encounter (INDEPENDENT_AMBULATORY_CARE_PROVIDER_SITE_OTHER): Payer: Medicare HMO | Admitting: Ophthalmology

## 2023-09-11 DIAGNOSIS — H353111 Nonexudative age-related macular degeneration, right eye, early dry stage: Secondary | ICD-10-CM

## 2023-09-11 DIAGNOSIS — H34232 Retinal artery branch occlusion, left eye: Secondary | ICD-10-CM

## 2023-09-11 DIAGNOSIS — H34212 Partial retinal artery occlusion, left eye: Secondary | ICD-10-CM

## 2023-09-11 DIAGNOSIS — H35033 Hypertensive retinopathy, bilateral: Secondary | ICD-10-CM | POA: Diagnosis not present

## 2023-09-11 DIAGNOSIS — H43813 Vitreous degeneration, bilateral: Secondary | ICD-10-CM | POA: Diagnosis not present

## 2023-09-11 DIAGNOSIS — I10 Essential (primary) hypertension: Secondary | ICD-10-CM

## 2023-10-08 ENCOUNTER — Ambulatory Visit (INDEPENDENT_AMBULATORY_CARE_PROVIDER_SITE_OTHER): Payer: Medicare Other

## 2023-10-08 DIAGNOSIS — Z0000 Encounter for general adult medical examination without abnormal findings: Secondary | ICD-10-CM | POA: Diagnosis not present

## 2023-10-08 NOTE — Patient Instructions (Signed)
 [name] , Thank you for taking time to come for your Medicare Wellness Visit. I appreciate your ongoing commitment to your health goals. Please review the following plan we discussed and let me know if I can assist you in the future.   Referrals/Orders/Follow-Ups/Clinician Recommendations: none  This is a list of the screening recommended for you and due dates:  Health Maintenance  Topic Date Due   Zoster (Shingles) Vaccine (1 of 2) Never done   Colon Cancer Screening  Never done   COVID-19 Vaccine (3 - Pfizer risk series) 01/07/2020   DTaP/Tdap/Td vaccine (3 - Td or Tdap) 03/27/2026   Pneumonia Vaccine  Completed   Hepatitis C Screening  Completed   HPV Vaccine  Aged Out    Advanced directives: (Copy Requested) Please bring a copy of your health care power of attorney and living will to the office to be added to your chart at your convenience.  Next Medicare Annual Wellness Visit scheduled for next year: Yes  insert Preventive Care attachment Insert FALL PREVENTION attachment if needed
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
## 2023-10-10 ENCOUNTER — Other Ambulatory Visit: Payer: Self-pay | Admitting: Gastroenterology
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
## 2023-10-24 ENCOUNTER — Encounter: Payer: Self-pay | Admitting: Cardiology

## 2023-10-24 ENCOUNTER — Ambulatory Visit: Payer: Medicare HMO | Attending: Cardiology | Admitting: Cardiology

## 2023-10-24 ENCOUNTER — Other Ambulatory Visit: Payer: Self-pay | Admitting: Gastroenterology

## 2023-10-24 VITALS — BP 118/60 | HR 53 | Ht 63.0 in | Wt 140.0 lb

## 2023-10-24 DIAGNOSIS — I5022 Chronic systolic (congestive) heart failure: Secondary | ICD-10-CM

## 2023-10-24 DIAGNOSIS — I10 Essential (primary) hypertension: Secondary | ICD-10-CM

## 2023-10-24 DIAGNOSIS — I480 Paroxysmal atrial fibrillation: Secondary | ICD-10-CM

## 2023-10-24 DIAGNOSIS — I350 Nonrheumatic aortic (valve) stenosis: Secondary | ICD-10-CM

## 2023-10-24 LAB — BASIC METABOLIC PANEL
BUN/Creatinine Ratio: 20 (ref 10–24)
BUN: 17 mg/dL (ref 8–27)
CO2: 21 mmol/L (ref 20–29)
Calcium: 9.1 mg/dL (ref 8.6–10.2)
Chloride: 98 mmol/L (ref 96–106)
Creatinine, Ser: 0.86 mg/dL (ref 0.76–1.27)
Glucose: 117 mg/dL — ABNORMAL HIGH (ref 70–99)
Potassium: 4.7 mmol/L (ref 3.5–5.2)
Sodium: 138 mmol/L (ref 134–144)
eGFR: 91 mL/min/{1.73_m2} (ref >59)

## 2023-10-24 LAB — MAGNESIUM: Magnesium: 2.1 mg/dL (ref 1.6–2.3)

## 2023-10-24 MED ORDER — FUROSEMIDE 20 MG PO TABS: 20.0000 mg | ORAL_TABLET | Freq: Every day | ORAL | 3 refills | Status: AC | PRN

## 2023-10-24 NOTE — Patient Instructions (Signed)
 Medication Instructions:  Only take Furosemide 20 mg daily prn for weight gain greater than 3 lbs in one day or 5 pounds in one week. New script sent. *If you need a refill on your cardiac medications before your next appointment, please call your pharmacy*   Lab Work: BMET, Magnesium today. If you have labs (blood work) drawn today and your tests are completely normal, you will receive your results only by: MyChart Message (if you have MyChart) OR A paper copy in the mail If you have any lab test that is abnormal or we need to change your treatment, we will call you to review the results.   Follow-Up: At [facility], you and your health needs are our priority.  As part of our continuing mission to provide you with exceptional heart care, we have created designated Provider Care Teams.  These Care Teams include your primary Cardiologist (physician) and Advanced Practice Providers (APPs -  Physician Assistants and Nurse Practitioners) who all work together to provide you with the care you need, when you need it.  We recommend signing up for the patient portal called "MyChart".  Sign up information is provided on this After Visit Summary.  MyChart is used to connect with patients for Virtual Visits (Telemedicine).  Patients are able to view lab/test results, encounter notes, upcoming appointments, etc.  Non-urgent messages can be sent to your provider as well.   To learn more about what you can do with MyChart, go to [email or web address].    Your next appointment:   3 month(s)  Provider:   Little Ishikawa, MD     Other Instructions

## 2023-10-26 ENCOUNTER — Other Ambulatory Visit: Payer: Self-pay | Admitting: Gastroenterology

## 2023-10-29 ENCOUNTER — Other Ambulatory Visit: Payer: Self-pay | Admitting: Gastroenterology

## 2023-11-08 ENCOUNTER — Ambulatory Visit (INDEPENDENT_AMBULATORY_CARE_PROVIDER_SITE_OTHER): Payer: Medicare HMO | Admitting: Family Medicine

## 2023-11-08 ENCOUNTER — Encounter: Payer: Self-pay | Admitting: Family Medicine

## 2023-11-08 VITALS — BP 122/70 | HR 45 | Temp 98.5°F | Ht 63.0 in | Wt 138.4 lb

## 2023-11-08 DIAGNOSIS — E782 Mixed hyperlipidemia: Secondary | ICD-10-CM

## 2023-11-08 DIAGNOSIS — I10 Essential (primary) hypertension: Secondary | ICD-10-CM

## 2023-11-08 DIAGNOSIS — I4891 Unspecified atrial fibrillation: Secondary | ICD-10-CM | POA: Diagnosis not present

## 2023-11-08 DIAGNOSIS — I5022 Chronic systolic (congestive) heart failure: Secondary | ICD-10-CM | POA: Insufficient documentation

## 2023-11-08 DIAGNOSIS — I25118 Atherosclerotic heart disease of native coronary artery with other forms of angina pectoris: Secondary | ICD-10-CM
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
## 2023-11-08 NOTE — Assessment & Plan Note (Signed)
 Stable. Continue isosorbide 30 mg daily and metoprolol succinate 100 mg daily.

## 2023-11-08 NOTE — Assessment & Plan Note (Signed)
 Continue metoprolol succinate 100 mg daily for rate control and apixaban 5 mg bid for stroke prevention.

## 2023-11-08 NOTE — Assessment & Plan Note (Signed)
 Compensated. Continue losartan 25 mg daily, metoprolol succinate 100 mg daily, and furosemide 20 mg as needed for weight increase/fluid retention.

## 2023-11-08 NOTE — Assessment & Plan Note (Signed)
 Blood pressure is at goal today. Continue losartan 25 mg daily and metoprolol succinate 100 mg daily

## 2023-11-08 NOTE — Assessment & Plan Note (Signed)
Lipids are at goal. Continue rosuvastatin 5 mg daily.

## 2023-11-12 ENCOUNTER — Other Ambulatory Visit: Payer: Self-pay

## 2023-11-12 DIAGNOSIS — F339 Major depressive disorder, recurrent, unspecified: Secondary | ICD-10-CM

## 2023-11-12 DIAGNOSIS — I25119 Atherosclerotic heart disease of native coronary artery with unspecified angina pectoris: Secondary | ICD-10-CM

## 2023-11-12 MED ORDER — ROSUVASTATIN CALCIUM 5 MG PO TABS: 5.0000 mg | ORAL_TABLET | Freq: Every day | ORAL | 3 refills | Status: AC

## 2023-11-12 MED ORDER — SERTRALINE HCL 50 MG PO TABS: 50.0000 mg | ORAL_TABLET | Freq: Every day | ORAL | 3 refills | Status: AC

## 2023-11-13 ENCOUNTER — Other Ambulatory Visit: Payer: Self-pay

## 2023-11-13 DIAGNOSIS — I4891 Unspecified atrial fibrillation: Secondary | ICD-10-CM

## 2023-11-13 MED ORDER — NITROGLYCERIN 0.4 MG SL SUBL: 0.4000 mg | SUBLINGUAL_TABLET | SUBLINGUAL | 3 refills | Status: AC | PRN

## 2023-11-13 MED ORDER — OMEPRAZOLE 20 MG PO CPDR: DELAYED_RELEASE_CAPSULE | ORAL | 3 refills | Status: AC

## 2023-11-13 MED ORDER — APIXABAN 5 MG PO TABS: 5.0000 mg | ORAL_TABLET | Freq: Two times a day (BID) | ORAL | 5 refills | Status: DC

## 2023-11-13 MED ORDER — METOPROLOL SUCCINATE ER 100 MG PO TB24: 100.0000 mg | ORAL_TABLET | Freq: Every day | ORAL | 3 refills | Status: DC

## 2023-11-13 MED ORDER — ISOSORBIDE MONONITRATE ER 30 MG PO TB24: 30.0000 mg | ORAL_TABLET | Freq: Every day | ORAL | 3 refills | Status: AC

## 2023-11-13 MED ORDER — LOSARTAN POTASSIUM 25 MG PO TABS: 25.0000 mg | ORAL_TABLET | Freq: Every day | ORAL | 3 refills | Status: AC

## 2023-11-13 NOTE — Telephone Encounter (Signed)
 Prescription refill request for Eliquis received. Indication: PAF Last office visit: 10/24/23  Audree Camel MD Scr: 0.86 on 10/24/23  Epic Age: 73 Weight: 63.5kg  Based on above findings Eliquis 5mg  twice daily is the appropriate dose.  Refill approved.

## 2023-12-10 ENCOUNTER — Ambulatory Visit (HOSPITAL_COMMUNITY): Payer: Medicare HMO | Attending: Cardiology

## 2023-12-10 DIAGNOSIS — I350 Nonrheumatic aortic (valve) stenosis: Secondary | ICD-10-CM | POA: Insufficient documentation

## 2023-12-10 LAB — ECHOCARDIOGRAM COMPLETE
AR max vel: 0.83 cm2
AV Area VTI: 0.83 cm2
AV Area mean vel: 0.83 cm2
AV Mean grad: 20.2 mmHg
AV Peak grad: 34.3 mmHg
Ao pk vel: 2.93 m/s
S' Lateral: 2.58 cm
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
## 2024-01-22 ENCOUNTER — Ambulatory Visit: Payer: Medicare HMO | Attending: Cardiology | Admitting: Cardiology

## 2024-01-22 ENCOUNTER — Encounter: Payer: Self-pay | Admitting: Cardiology

## 2024-01-22 VITALS — BP 130/52 | HR 46 | Ht 63.5 in | Wt 140.0 lb

## 2024-01-22 DIAGNOSIS — I10 Essential (primary) hypertension: Secondary | ICD-10-CM

## 2024-01-22 DIAGNOSIS — I5022 Chronic systolic (congestive) heart failure: Secondary | ICD-10-CM

## 2024-01-22 DIAGNOSIS — I480 Paroxysmal atrial fibrillation: Secondary | ICD-10-CM

## 2024-01-22 DIAGNOSIS — I350 Nonrheumatic aortic (valve) stenosis: Secondary | ICD-10-CM

## 2024-01-22 DIAGNOSIS — I2510 Atherosclerotic heart disease of native coronary artery without angina pectoris: Secondary | ICD-10-CM | POA: Diagnosis not present

## 2024-01-22 MED ORDER — METOPROLOL SUCCINATE ER 50 MG PO TB24: 50.0000 mg | ORAL_TABLET | Freq: Every day | ORAL | 3 refills | Status: AC

## 2024-01-22 NOTE — Patient Instructions (Addendum)
 Medication Instructions:  Decrease Toprol  XL to 50 mg daily Continue current medications *If you need a refill on your cardiac medications before your next appointment, please call your pharmacy*  Lab Work: none If you have labs (blood work) drawn today and your tests are completely normal, you will receive your results only by: MyChart Message (if you have MyChart) OR A paper copy in the mail If you have any lab test that is abnormal or we need to change your treatment, we will call you to review the results.  Testing/Procedures: ECHO  Your physician has requested that you have an echocardiogram. Echocardiography is a painless test that uses sound waves to create images of your heart. It provides your doctor with information about the size and shape of your heart and how well your heart's chambers and valves are working. This procedure takes approximately one hour. There are no restrictions for this procedure. Please do NOT wear cologne, perfume, aftershave, or lotions (deodorant is allowed). Please arrive 15 minutes prior to your appointment time.  Please note: We ask at that you not bring children with you during ultrasound (echo/ vascular) testing. Due to room size and safety concerns, children are not allowed in the ultrasound rooms during exams. Our front office staff cannot provide observation of children in our lobby area while testing is being conducted. An adult accompanying a patient to their appointment will only be allowed in the ultrasound room at the discretion of the ultrasound technician under special circumstances. We apologize for any inconvenience.   Follow-Up: At [facility], you and your health needs are our priority.  As part of our continuing mission to provide you with exceptional heart care, our providers are all part of one team.  This team includes your primary Cardiologist (physician) and Advanced Practice Providers or APPs (Physician Assistants and Nurse  Practitioners) who all work together to provide you with the care you need, when you need it.  Your next appointment:   6 month(s) post ECHO DR. Alda Amas  Provider:   Dr.Schumann  We recommend signing up for the patient portal called "MyChart".  Sign up information is provided on this After Visit Summary.  MyChart is used to connect with patients for Virtual Visits (Telemedicine).  Patients are able to view lab/test results, encounter notes, upcoming appointments, etc.  Non-urgent messages can be sent to your provider as well.   To learn more about what you can do with MyChart, go to [email or web address].   Other Instructions none

## 2024-02-28 ENCOUNTER — Telehealth: Payer: Self-pay

## 2024-02-28 NOTE — Telephone Encounter (Signed)
 Copied from CRM [id number]. Topic: General - Other >> Feb 28, 2024 12:16 PM Savanna F wrote: Reason for CRM: Animator with [facility] would like Dr Thedora to know that he is enrolled in a chronic conditions program and she is going to be faxing over a form (verification of chronic condition form) and if this is not sent back in time he will lose the program.  Amber's callback is [phone]

## 2024-02-28 NOTE — Telephone Encounter (Signed)
 Noted. Will wait for form to be faxed to us . Dm/cma

## 2024-03-12 ENCOUNTER — Other Ambulatory Visit: Payer: Self-pay | Admitting: Family Medicine

## 2024-03-12 MED ORDER — SODIUM CHLORIDE 1 G PO TABS
1.0000 g | ORAL_TABLET | Freq: Two times a day (BID) | ORAL | 1 refills | Status: AC
Start: 2024-03-12 — End: ?

## 2024-03-12 NOTE — Telephone Encounter (Signed)
 Copied from CRM [id number]. Topic: Clinical - Medication Refill >> Mar 12, 2024  8:14 AM Rosina BIRCH wrote: Medication: sodium chloride  1 g tablet  Has the patient contacted their pharmacy? No (Agent: If no, request that the patient contact the pharmacy for the refill. If patient does not wish to contact the pharmacy document the reason why and proceed with request.) (Agent: If yes, when and what did the pharmacy advise?)  This is the patient's preferred pharmacy:   [facility] - [place], [place] - [address] [place] [address] Phone: [phone] Fax: [phone]  Is this the correct pharmacy for this prescription? Yes If no, delete pharmacy and type the correct one.   Has the prescription been filled recently? No  Is the patient out of the medication? Yes  Has the patient been seen for an appointment in the last year OR does the patient have an upcoming appointment? Yes  Can we respond through MyChart? No  Agent: Please be advised that Rx refills may take up to 3 business days. We ask that you follow-up with your pharmacy.

## 2024-03-12 NOTE — Telephone Encounter (Signed)
Rx sent to the pharmacy. Dm/cma  

## 2024-03-19 NOTE — Telephone Encounter (Unsigned)
 Copied from CRM #[id number]. Topic: General - Other >> Mar 19, 2024  1:03 PM Paige D wrote: Reason for CRM: Billy Stone is requesting more info on pt in regards to sal Deficiency. Pt is asking that this gets taken care of at your earliest convenience. Pt is asking that some one reach back out to him in regards to an update on this.

## 2024-03-20 NOTE — Telephone Encounter (Signed)
 Fax received requesting clarification for Sodium Chloride  1GM TAB. 1st Rx written for 03/11/24 Directions 1 tab TID with meals 2nd. Rx written 03/12/24  Directions 1 tab BID with a meal  Which is correct therapy  Phone [phone].

## 2024-03-21 NOTE — Telephone Encounter (Signed)
 Copied from CRM [id number]. Topic: Clinical - Medication Question >> Mar 21, 2024  3:47 PM Lavanda D wrote: Reason for CRM: Adrienne with [facility] calling for clarification on sodium chloride  1 g tablet They received 2 prescriptions, 1 from 7/15 and 7/16. One says to take it 1x a day, the other says to take it 3x a day. Please call for clarification on which instructions are accurate.

## 2024-03-24 NOTE — Telephone Encounter (Signed)
 I called [facility] and spoke with a representative who then transferred me to a pharmacist and corrected patient's medication to twice a day and will send out in mail today.

## 2024-03-25 NOTE — Telephone Encounter (Signed)
 Edit and addendum to close encounter

## 2024-04-04 ENCOUNTER — Other Ambulatory Visit: Payer: Self-pay | Admitting: Cardiology

## 2024-04-04 DIAGNOSIS — I4891 Unspecified atrial fibrillation: Secondary | ICD-10-CM

## 2024-04-04 NOTE — Telephone Encounter (Signed)
 Pt last saw Dr Kate 01/22/24, last labs 10/24/23 Creat 0.86, age 73, weight 63.5kg, based on specified criteria pt is on appropriate dosage of Eliquis  5mg  BID for afib.  Will refill rx.

## 2024-04-15 ENCOUNTER — Other Ambulatory Visit: Payer: Self-pay | Admitting: Gastroenterology

## 2024-04-15 ENCOUNTER — Other Ambulatory Visit: Payer: Self-pay

## 2024-04-15 MED ORDER — POLYETHYLENE GLYCOL 3350 17 GM/SCOOP PO POWD: 17.0000 g | Freq: Every day | ORAL | 0 refills | Status: AC

## 2024-04-15 NOTE — Progress Notes (Signed)
 Correction for Miralax  script

## 2024-04-19 ENCOUNTER — Other Ambulatory Visit: Payer: Self-pay | Admitting: Gastroenterology

## 2024-04-25 ENCOUNTER — Other Ambulatory Visit: Payer: Self-pay | Admitting: Gastroenterology

## 2024-04-29 ENCOUNTER — Encounter: Payer: Self-pay | Admitting: Family Medicine

## 2024-04-29 ENCOUNTER — Ambulatory Visit (INDEPENDENT_AMBULATORY_CARE_PROVIDER_SITE_OTHER): Admitting: Family Medicine

## 2024-04-29 ENCOUNTER — Ambulatory Visit: Payer: Self-pay | Admitting: Family Medicine

## 2024-04-29 VITALS — BP 120/64 | HR 56 | Temp 97.1°F | Ht 63.5 in | Wt 140.0 lb

## 2024-04-29 DIAGNOSIS — I25118 Atherosclerotic heart disease of native coronary artery with other forms of angina pectoris: Secondary | ICD-10-CM

## 2024-04-29 DIAGNOSIS — E782 Mixed hyperlipidemia: Secondary | ICD-10-CM

## 2024-04-29 DIAGNOSIS — I5022 Chronic systolic (congestive) heart failure: Secondary | ICD-10-CM

## 2024-04-29 DIAGNOSIS — I10 Essential (primary) hypertension: Secondary | ICD-10-CM | POA: Diagnosis not present

## 2024-04-29 DIAGNOSIS — I4891 Unspecified atrial fibrillation: Secondary | ICD-10-CM

## 2024-04-29 DIAGNOSIS — F1021 Alcohol dependence, in remission: Secondary | ICD-10-CM

## 2024-04-29 LAB — LIPID PANEL
Cholesterol: 98 mg/dL (ref 0–200)
HDL: 38.9 mg/dL — ABNORMAL LOW (ref >39.00)
LDL Cholesterol: 44 mg/dL (ref 0–99)
NonHDL: 59.41
Total CHOL/HDL Ratio: 3
Triglycerides: 76 mg/dL (ref 0.0–149.0)
VLDL: 15.2 mg/dL (ref 0.0–40.0)
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
## 2024-04-29 NOTE — Assessment & Plan Note (Signed)
 Compensated. Continue losartan  25 mg daily, metoprolol  succinate 50 mg daily, and furosemide  20 mg as needed for weight increase/fluid retention.

## 2024-04-29 NOTE — Assessment & Plan Note (Addendum)
 I will reassess fasting lipids today. Continue rosuvastatin  5 mg daily.

## 2024-04-29 NOTE — Assessment & Plan Note (Signed)
 Blood pressure is at goal today. Continue losartan  25 mg daily and metoprolol  succinate 50 mg daily

## 2024-04-29 NOTE — Assessment & Plan Note (Signed)
 Continue metoprolol  succinate 50 mg daily for rate control and apixaban  5 mg bid for stroke prevention.

## 2024-04-29 NOTE — Assessment & Plan Note (Signed)
 Continue sobriety for 3+ years. Previous impacts of his alcoholism have been improved across the board.

## 2024-04-29 NOTE — Assessment & Plan Note (Addendum)
 Stable. No angina. Continue isosorbide  mononitrate 30 mg daily and metoprolol  succinate 50 mg daily.

## 2024-05-06 ENCOUNTER — Ambulatory Visit: Admitting: Family Medicine

## 2024-06-10 ENCOUNTER — Encounter (INDEPENDENT_AMBULATORY_CARE_PROVIDER_SITE_OTHER): Payer: Medicare HMO | Admitting: Ophthalmology

## 2024-06-10 DIAGNOSIS — H35033 Hypertensive retinopathy, bilateral: Secondary | ICD-10-CM | POA: Diagnosis not present

## 2024-06-10 DIAGNOSIS — H43813 Vitreous degeneration, bilateral: Secondary | ICD-10-CM | POA: Diagnosis not present

## 2024-06-10 DIAGNOSIS — I10 Essential (primary) hypertension: Secondary | ICD-10-CM | POA: Diagnosis not present

## 2024-06-10 DIAGNOSIS — H353111 Nonexudative age-related macular degeneration, right eye, early dry stage: Secondary | ICD-10-CM | POA: Diagnosis not present

## 2024-06-10 DIAGNOSIS — H34212 Partial retinal artery occlusion, left eye: Secondary | ICD-10-CM

## 2024-06-10 DIAGNOSIS — H34232 Retinal artery branch occlusion, left eye: Secondary | ICD-10-CM | POA: Diagnosis not present

## 2024-07-16 DIAGNOSIS — M1990 Unspecified osteoarthritis, unspecified site: Secondary | ICD-10-CM | POA: Diagnosis not present

## 2024-07-16 DIAGNOSIS — R32 Unspecified urinary incontinence: Secondary | ICD-10-CM | POA: Diagnosis not present

## 2024-07-16 DIAGNOSIS — I110 Hypertensive heart disease with heart failure: Secondary | ICD-10-CM | POA: Diagnosis not present

## 2024-07-16 DIAGNOSIS — Z7901 Long term (current) use of anticoagulants: Secondary | ICD-10-CM | POA: Diagnosis not present

## 2024-07-16 DIAGNOSIS — I2510 Atherosclerotic heart disease of native coronary artery without angina pectoris: Secondary | ICD-10-CM | POA: Diagnosis not present

## 2024-07-16 DIAGNOSIS — Z823 Family history of stroke: Secondary | ICD-10-CM | POA: Diagnosis not present

## 2024-07-16 DIAGNOSIS — I509 Heart failure, unspecified: Secondary | ICD-10-CM | POA: Diagnosis not present

## 2024-07-16 DIAGNOSIS — D6869 Other thrombophilia: Secondary | ICD-10-CM | POA: Diagnosis not present

## 2024-07-16 DIAGNOSIS — F325 Major depressive disorder, single episode, in full remission: Secondary | ICD-10-CM | POA: Diagnosis not present

## 2024-07-16 DIAGNOSIS — K219 Gastro-esophageal reflux disease without esophagitis: Secondary | ICD-10-CM | POA: Diagnosis not present

## 2024-07-16 DIAGNOSIS — F1021 Alcohol dependence, in remission: Secondary | ICD-10-CM | POA: Diagnosis not present

## 2024-07-16 DIAGNOSIS — I4891 Unspecified atrial fibrillation: Secondary | ICD-10-CM | POA: Diagnosis not present

## 2024-07-16 DIAGNOSIS — F419 Anxiety disorder, unspecified: Secondary | ICD-10-CM | POA: Diagnosis not present

## 2024-07-16 DIAGNOSIS — Z809 Family history of malignant neoplasm, unspecified: Secondary | ICD-10-CM | POA: Diagnosis not present

## 2024-07-16 DIAGNOSIS — Z880 Allergy status to penicillin: Secondary | ICD-10-CM | POA: Diagnosis not present

## 2024-07-16 DIAGNOSIS — Z87440 Personal history of urinary (tract) infections: Secondary | ICD-10-CM | POA: Diagnosis not present

## 2024-07-16 DIAGNOSIS — M810 Age-related osteoporosis without current pathological fracture: Secondary | ICD-10-CM | POA: Diagnosis not present

## 2024-07-16 DIAGNOSIS — E785 Hyperlipidemia, unspecified: Secondary | ICD-10-CM | POA: Diagnosis not present

## 2024-07-23 ENCOUNTER — Ambulatory Visit (HOSPITAL_COMMUNITY)
Admission: RE | Admit: 2024-07-23 | Discharge: 2024-07-23 | Disposition: A | Discharge status: Home / Self Care | Source: Ambulatory Visit | Attending: Cardiology | Admitting: Cardiology

## 2024-07-23 DIAGNOSIS — I350 Nonrheumatic aortic (valve) stenosis: Secondary | ICD-10-CM | POA: Diagnosis not present

## 2024-07-23 LAB — ECHOCARDIOGRAM COMPLETE
AR max vel: 0.69 cm2
AV Area VTI: 0.68 cm2
AV Area mean vel: 0.71 cm2
AV Mean grad: 21.3 mmHg
AV Peak grad: 38.9 mmHg
Ao pk vel: 3.12 m/s
Area-P 1/2: 2.19 cm2
P 1/2 time: 747 ms
S' Lateral: 3.2 cm

## 2024-07-27 ENCOUNTER — Ambulatory Visit: Payer: Self-pay | Admitting: Cardiology
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
## 2024-08-01 ENCOUNTER — Encounter: Payer: Self-pay | Admitting: Cardiology

## 2024-08-01 ENCOUNTER — Ambulatory Visit: Attending: Cardiology | Admitting: Cardiology

## 2024-08-01 VITALS — BP 118/74 | HR 68 | Ht 63.5 in | Wt 139.6 lb

## 2024-08-01 DIAGNOSIS — Z79899 Other long term (current) drug therapy: Secondary | ICD-10-CM

## 2024-08-01 DIAGNOSIS — I25118 Atherosclerotic heart disease of native coronary artery with other forms of angina pectoris: Secondary | ICD-10-CM | POA: Diagnosis not present

## 2024-08-01 DIAGNOSIS — I10 Essential (primary) hypertension: Secondary | ICD-10-CM

## 2024-08-01 DIAGNOSIS — I480 Paroxysmal atrial fibrillation: Secondary | ICD-10-CM

## 2024-08-01 DIAGNOSIS — I350 Nonrheumatic aortic (valve) stenosis: Secondary | ICD-10-CM

## 2024-08-01 LAB — COMPREHENSIVE METABOLIC PANEL WITH GFR
ALT: 13 IU/L (ref 0–44)
AST: 27 IU/L (ref 0–40)
Albumin: 3.8 g/dL (ref 3.8–4.8)
Alkaline Phosphatase: 157 IU/L — ABNORMAL HIGH (ref 47–123)
BUN/Creatinine Ratio: 19 (ref 10–24)
BUN: 17 mg/dL (ref 8–27)
Bilirubin Total: 0.5 mg/dL (ref 0.0–1.2)
CO2: 21 mmol/L (ref 20–29)
Calcium: 9.3 mg/dL (ref 8.6–10.2)
Chloride: 100 mmol/L (ref 96–106)
Creatinine, Ser: 0.9 mg/dL (ref 0.76–1.27)
Globulin, Total: 4 g/dL (ref 1.5–4.5)
Glucose: 117 mg/dL — ABNORMAL HIGH (ref 70–99)
Potassium: 4.5 mmol/L (ref 3.5–5.2)
Sodium: 136 mmol/L (ref 134–144)
Total Protein: 7.8 g/dL (ref 6.0–8.5)
eGFR: 89 mL/min/1.73 (ref >59)

## 2024-08-01 LAB — CBC
Hematocrit: 30.9 % — ABNORMAL LOW (ref 37.5–51.0)
Hemoglobin: 9.9 g/dL — ABNORMAL LOW (ref 13.0–17.7)
MCH: 26.7 pg (ref 26.6–33.0)
MCHC: 32 g/dL (ref 31.5–35.7)
MCV: 83 fL (ref 79–97)
Platelets: 331 x10E3/uL (ref 150–450)
RBC: 3.71 x10E6/uL — ABNORMAL LOW (ref 4.14–5.80)
RDW: 16.5 % — ABNORMAL HIGH (ref 11.6–15.4)
WBC: 9.9 x10E3/uL (ref 3.4–10.8)

## 2024-08-01 NOTE — Patient Instructions (Signed)
 Medication Instructions:  Your physician recommends that you continue on your current medications as directed. Please refer to the Current Medication list given to you today.  *If you need a refill on your cardiac medications before your next appointment, please call your pharmacy*  Lab Work: Today: CMET & CBC  If you have any lab test that is abnormal or we need to change your treatment, we will call you to review the results.  Testing/Procedures: None ordered  Follow-Up: At [facility], you and your health needs are our priority.  As part of our continuing mission to provide you with exceptional heart care, our providers are all part of one team.  This team includes your primary Cardiologist (physician) and Advanced Practice Providers or APPs (Physician Assistants and Nurse Practitioners) who all work together to provide you with the care you need, when you need it.  You have been referred to our valve team to discuss aortic valve/stenosis  Your next appointment:   3 month(s)  Provider:   Dr. Kate   We recommend signing up for the patient portal called MyChart.  Sign up information is provided on this After Visit Summary.  MyChart is used to connect with patients for Virtual Visits (Telemedicine).  Patients are able to view lab/test results, encounter notes, upcoming appointments, etc.  Non-urgent messages can be sent to your provider as well.   To learn more about what you can do with MyChart, go to [email or web address].   Thank you for choosing [facility]!!   Maeola Domino, RN [phone]   Other Instructions  Hold your Eliquis  for 2 days, if still experiencing hematuria (blood in the urine) call your urologist to further discuss

## 2024-08-03 ENCOUNTER — Ambulatory Visit: Payer: Self-pay | Admitting: Cardiology

## 2024-08-05 ENCOUNTER — Telehealth: Payer: Self-pay | Admitting: Cardiology

## 2024-08-05 ENCOUNTER — Ambulatory Visit: Admitting: Family Medicine

## 2024-08-05 NOTE — Telephone Encounter (Signed)
 Left message to call back.

## 2024-08-05 NOTE — Telephone Encounter (Signed)
 Pt was seen on 08/01/24 an left his AVS at the office. Pt would like a nurse to c/b to go over any instructions that was on his AVS please advise

## 2024-08-08 NOTE — Telephone Encounter (Signed)
 Patient called and given Instructions from AVS and results of lab work

## 2024-08-12 ENCOUNTER — Other Ambulatory Visit: Payer: Self-pay | Admitting: *Deleted

## 2024-08-12 DIAGNOSIS — I25118 Atherosclerotic heart disease of native coronary artery with other forms of angina pectoris: Secondary | ICD-10-CM

## 2024-08-12 DIAGNOSIS — K7031 Alcoholic cirrhosis of liver with ascites: Secondary | ICD-10-CM

## 2024-08-12 DIAGNOSIS — I10 Essential (primary) hypertension: Secondary | ICD-10-CM

## 2024-08-13 ENCOUNTER — Telehealth: Payer: Self-pay

## 2024-08-13 LAB — HAPTOGLOBIN: Haptoglobin: 128 mg/dL (ref 34–355)

## 2024-08-13 LAB — B12 AND FOLATE PANEL
Folate: >20 ng/mL (ref >3.0)
Vitamin B-12: 1001 pg/mL (ref 232–1245)

## 2024-08-13 LAB — LACTATE DEHYDROGENASE: LDH: 135 IU/L (ref 121–224)

## 2024-08-13 LAB — TSH: TSH: 4.96 u[IU]/mL — ABNORMAL HIGH (ref 0.450–4.500)

## 2024-08-13 LAB — RETICULOCYTES: Retic Ct Pct: 1 % (ref 0.6–2.6)

## 2024-08-13 NOTE — Telephone Encounter (Signed)
 I have attempted to call the patient three times since receiving his referral to structural heart. I sent a letter to his home letting him know that we have tried to get in contact with him on 08/06/24. He called back on 08/11/24 and left a message while I was on the phone with another patient, and when I called him right back he did not answer. As of now we still have not been able to schedule him for a consult.

## 2024-08-14 NOTE — Telephone Encounter (Signed)
 Scheduled the patient for TAVR consult with Dr. Wendel on 09/09/2024. The patient requests Tuesday AM visits due to transportation. He was grateful for call.

## 2024-08-19 ENCOUNTER — Ambulatory Visit: Payer: Self-pay | Admitting: Cardiology

## 2024-08-19 DIAGNOSIS — D649 Anemia, unspecified: Secondary | ICD-10-CM

## 2024-08-19 DIAGNOSIS — I10 Essential (primary) hypertension: Secondary | ICD-10-CM

## 2024-08-19 DIAGNOSIS — E538 Deficiency of other specified B group vitamins: Secondary | ICD-10-CM

## 2024-08-22 ENCOUNTER — Other Ambulatory Visit: Payer: Self-pay | Admitting: Emergency Medicine

## 2024-08-22 DIAGNOSIS — D649 Anemia, unspecified: Secondary | ICD-10-CM

## 2024-08-22 DIAGNOSIS — I10 Essential (primary) hypertension: Secondary | ICD-10-CM

## 2024-08-22 DIAGNOSIS — E538 Deficiency of other specified B group vitamins: Secondary | ICD-10-CM

## 2024-08-22 LAB — IRON,TIBC AND FERRITIN PANEL
Ferritin: 23 ng/mL — ABNORMAL LOW (ref 30–400)
Iron Saturation: 9 % — CL (ref 15–55)
Iron: 33 ug/dL — ABNORMAL LOW (ref 38–169)
Total Iron Binding Capacity: 382 ug/dL (ref 250–450)
UIBC: 349 ug/dL — ABNORMAL HIGH (ref 111–343)

## 2024-08-22 LAB — TSH: TSH: 2.16 u[IU]/mL (ref 0.450–4.500)

## 2024-08-22 LAB — T4, FREE: Free T4: 0.94 ng/dL (ref 0.82–1.77)

## 2024-08-24 ENCOUNTER — Ambulatory Visit: Payer: Self-pay | Admitting: Cardiology

## 2024-08-24 DIAGNOSIS — D649 Anemia, unspecified: Secondary | ICD-10-CM

## 2024-08-24 DIAGNOSIS — D508 Other iron deficiency anemias: Secondary | ICD-10-CM
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
## 2024-09-02 ENCOUNTER — Other Ambulatory Visit: Payer: Self-pay | Admitting: Gastroenterology

## 2024-09-09 ENCOUNTER — Ambulatory Visit: Attending: Internal Medicine | Admitting: Internal Medicine

## 2024-09-09 ENCOUNTER — Encounter: Payer: Self-pay | Admitting: Internal Medicine

## 2024-09-09 VITALS — BP 120/40 | HR 62 | Ht 63.0 in | Wt 140.8 lb

## 2024-09-09 DIAGNOSIS — K7460 Unspecified cirrhosis of liver: Secondary | ICD-10-CM

## 2024-09-09 DIAGNOSIS — I5042 Chronic combined systolic (congestive) and diastolic (congestive) heart failure: Secondary | ICD-10-CM

## 2024-09-09 DIAGNOSIS — I25118 Atherosclerotic heart disease of native coronary artery with other forms of angina pectoris: Secondary | ICD-10-CM

## 2024-09-09 DIAGNOSIS — D509 Iron deficiency anemia, unspecified: Secondary | ICD-10-CM | POA: Diagnosis not present

## 2024-09-09 DIAGNOSIS — N182 Chronic kidney disease, stage 2 (mild): Secondary | ICD-10-CM

## 2024-09-09 DIAGNOSIS — I350 Nonrheumatic aortic (valve) stenosis: Secondary | ICD-10-CM | POA: Diagnosis not present

## 2024-09-09 DIAGNOSIS — I10 Essential (primary) hypertension: Secondary | ICD-10-CM

## 2024-09-09 DIAGNOSIS — I480 Paroxysmal atrial fibrillation: Secondary | ICD-10-CM | POA: Diagnosis not present

## 2024-09-09 DIAGNOSIS — D6869 Other thrombophilia: Secondary | ICD-10-CM | POA: Diagnosis not present

## 2024-09-09 DIAGNOSIS — E785 Hyperlipidemia, unspecified: Secondary | ICD-10-CM | POA: Diagnosis not present

## 2024-09-09 LAB — CBC
Hematocrit: 29.6 % — ABNORMAL LOW (ref 37.5–51.0)
Hemoglobin: 9.2 g/dL — ABNORMAL LOW (ref 13.0–17.7)
MCH: 25.6 pg — ABNORMAL LOW (ref 26.6–33.0)
MCHC: 31.1 g/dL — ABNORMAL LOW (ref 31.5–35.7)
MCV: 83 fL (ref 79–97)
Platelets: 391 x10E3/uL (ref 150–450)
RBC: 3.59 x10E6/uL — ABNORMAL LOW (ref 4.14–5.80)
RDW: 16.4 % — ABNORMAL HIGH (ref 11.6–15.4)
WBC: 9.4 x10E3/uL (ref 3.4–10.8)

## 2024-09-09 NOTE — Patient Instructions (Signed)
 Medication Instructions:  No medication changes were made at this visit. Continue current regimen.   *If you need a refill on your cardiac medications before your next appointment, please call your pharmacy*  Lab Work: None ordered today. If you have labs (blood work) drawn today and your tests are completely normal, you will receive your results only by: MyChart Message (if you have MyChart) OR A paper copy in the mail If you have any lab test that is abnormal or we need to change your treatment, we will call you to review the results.  Testing/Procedures: Your physician has requested that you have an echocardiogram prior to 3 month follow-up with Dr. Wendel. Echocardiography is a painless test that uses sound waves to create images of your heart. It provides your doctor with information about the size and shape of your heart and how well your hearts chambers and valves are working. This procedure takes approximately one hour. There are no restrictions for this procedure. Please do NOT wear cologne, perfume, aftershave, or lotions (deodorant is allowed). Please arrive 15 minutes prior to your appointment time.  Please note: We ask at that you not bring children with you during ultrasound (echo/ vascular) testing. Due to room size and safety concerns, children are not allowed in the ultrasound rooms during exams. Our front office staff cannot provide observation of children in our lobby area while testing is being conducted. An adult accompanying a patient to their appointment will only be allowed in the ultrasound room at the discretion of the ultrasound technician under special circumstances. We apologize for any inconvenience.   Follow-Up: At [facility], you and your health needs are our priority.  As part of our continuing mission to provide you with exceptional heart care, our providers are all part of one team.  This team includes your primary Cardiologist (physician) and  Advanced Practice Providers or APPs (Physician Assistants and Nurse Practitioners) who all work together to provide you with the care you need, when you need it.  Your next appointment:   3 month(s) (after echo)  Provider:   Lurena Wendel, MD

## 2024-09-10 ENCOUNTER — Encounter: Payer: Self-pay | Admitting: Family Medicine

## 2024-09-10 ENCOUNTER — Ambulatory Visit (INDEPENDENT_AMBULATORY_CARE_PROVIDER_SITE_OTHER): Admitting: Family Medicine

## 2024-09-10 ENCOUNTER — Ambulatory Visit: Payer: Self-pay | Admitting: Internal Medicine

## 2024-09-10 VITALS — BP 122/66 | HR 58 | Temp 97.5°F | Ht 63.0 in | Wt 138.4 lb

## 2024-09-10 DIAGNOSIS — I350 Nonrheumatic aortic (valve) stenosis: Secondary | ICD-10-CM | POA: Diagnosis not present

## 2024-09-10 DIAGNOSIS — D508 Other iron deficiency anemias: Secondary | ICD-10-CM

## 2024-09-10 DIAGNOSIS — I10 Essential (primary) hypertension: Secondary | ICD-10-CM

## 2024-09-10 DIAGNOSIS — F1021 Alcohol dependence, in remission: Secondary | ICD-10-CM | POA: Diagnosis not present

## 2024-09-10 DIAGNOSIS — E782 Mixed hyperlipidemia: Secondary | ICD-10-CM | POA: Diagnosis not present

## 2024-09-10 DIAGNOSIS — K7030 Alcoholic cirrhosis of liver without ascites: Secondary | ICD-10-CM | POA: Diagnosis not present

## 2024-09-10 DIAGNOSIS — I25118 Atherosclerotic heart disease of native coronary artery with other forms of angina pectoris: Secondary | ICD-10-CM | POA: Diagnosis not present

## 2024-09-10 DIAGNOSIS — I110 Hypertensive heart disease with heart failure: Secondary | ICD-10-CM

## 2024-09-10 DIAGNOSIS — K3189 Other diseases of stomach and duodenum: Secondary | ICD-10-CM

## 2024-09-10 DIAGNOSIS — I4891 Unspecified atrial fibrillation: Secondary | ICD-10-CM | POA: Diagnosis not present

## 2024-09-10 DIAGNOSIS — I5022 Chronic systolic (congestive) heart failure: Secondary | ICD-10-CM

## 2024-09-10 DIAGNOSIS — K766 Portal hypertension: Secondary | ICD-10-CM | POA: Diagnosis not present

## 2024-09-10 NOTE — Assessment & Plan Note (Signed)
 Dr. Wendel has recommended TAVR.

## 2024-09-10 NOTE — Assessment & Plan Note (Signed)
 Compensated. Continue losartan  25 mg daily, metoprolol  succinate 50 mg daily, and furosemide  20 mg as needed for weight increase/fluid retention.
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
## 2024-09-10 NOTE — Assessment & Plan Note (Signed)
 I have referred Billy Stone to hematology for iron infusion.

## 2024-09-10 NOTE — Assessment & Plan Note (Addendum)
 Continue sobriety for 4+ years. Previous impacts of his alcoholism have been improved across the board.

## 2024-09-10 NOTE — Assessment & Plan Note (Addendum)
 LDL cholesterol is at goal. Continue rosuvastatin  5 mg daily.

## 2024-09-10 NOTE — Assessment & Plan Note (Signed)
 Blood pressure is at goal today. Continue losartan  25 mg daily and metoprolol  succinate 50 mg daily

## 2024-09-10 NOTE — Assessment & Plan Note (Signed)
 No sign of active GI bleeding.

## 2024-09-10 NOTE — Assessment & Plan Note (Signed)
 Stable. Appears compensated, likely secondary to sustained sobriety.

## 2024-09-10 NOTE — Assessment & Plan Note (Signed)
 Stable. No angina. Continue isosorbide  mononitrate 30 mg daily and metoprolol  succinate 50 mg daily.

## 2024-09-10 NOTE — Assessment & Plan Note (Signed)
 Continue metoprolol  succinate 50 mg daily for rate control and apixaban  5 mg bid for stroke prevention.

## 2024-09-12 ENCOUNTER — Inpatient Hospital Stay

## 2024-09-12 ENCOUNTER — Inpatient Hospital Stay: Attending: Oncology | Admitting: Oncology

## 2024-09-12 ENCOUNTER — Encounter: Payer: Self-pay | Admitting: Oncology

## 2024-09-12 VITALS — BP 122/61 | HR 85 | Temp 97.8°F | Resp 17 | Wt 140.7 lb

## 2024-09-12 DIAGNOSIS — Z87891 Personal history of nicotine dependence: Secondary | ICD-10-CM | POA: Insufficient documentation

## 2024-09-12 DIAGNOSIS — D649 Anemia, unspecified: Secondary | ICD-10-CM

## 2024-09-12 DIAGNOSIS — Z809 Family history of malignant neoplasm, unspecified: Secondary | ICD-10-CM | POA: Insufficient documentation

## 2024-09-12 DIAGNOSIS — D509 Iron deficiency anemia, unspecified: Secondary | ICD-10-CM | POA: Insufficient documentation

## 2024-09-12 LAB — IRON AND IRON BINDING CAPACITY (CC-WL,HP ONLY)
Iron: 24 ug/dL — ABNORMAL LOW (ref 45–182)
Saturation Ratios: 6 % — ABNORMAL LOW (ref 17.9–39.5)
TIBC: 419 ug/dL (ref 250–450)
UIBC: 395 ug/dL

## 2024-09-12 LAB — CMP (CANCER CENTER ONLY)
ALT: 13 U/L (ref 0–44)
AST: 32 U/L (ref 15–41)
Albumin: 3.8 g/dL (ref 3.5–5.0)
Alkaline Phosphatase: 124 U/L (ref 38–126)
Anion gap: 10 (ref 5–15)
BUN: 16 mg/dL (ref 8–23)
CO2: 22 mmol/L (ref 22–32)
Calcium: 9 mg/dL (ref 8.9–10.3)
Chloride: 105 mmol/L (ref 98–111)
Creatinine: 0.86 mg/dL (ref 0.61–1.24)
GFR, Estimated: >60 mL/min
Glucose, Bld: 128 mg/dL — ABNORMAL HIGH (ref 70–99)
Potassium: 5.1 mmol/L (ref 3.5–5.1)
Sodium: 136 mmol/L (ref 135–145)
Total Bilirubin: 0.5 mg/dL (ref 0.0–1.2)
Total Protein: 8.2 g/dL — ABNORMAL HIGH (ref 6.5–8.1)

## 2024-09-12 LAB — CBC WITH DIFFERENTIAL (CANCER CENTER ONLY)
Abs Immature Granulocytes: 0.02 K/uL (ref 0.00–0.07)
Basophils Absolute: 0.1 K/uL (ref 0.0–0.1)
Basophils Relative: 1 %
Eosinophils Absolute: 0.4 K/uL (ref 0.0–0.5)
Eosinophils Relative: 4 %
HCT: 29.5 % — ABNORMAL LOW (ref 39.0–52.0)
Hemoglobin: 9.4 g/dL — ABNORMAL LOW (ref 13.0–17.0)
Immature Granulocytes: 0 %
Lymphocytes Relative: 39 %
Lymphs Abs: 3.5 K/uL (ref 0.7–4.0)
MCH: 25.7 pg — ABNORMAL LOW (ref 26.0–34.0)
MCHC: 31.9 g/dL (ref 30.0–36.0)
MCV: 80.6 fL (ref 80.0–100.0)
Monocytes Absolute: 0.7 K/uL (ref 0.1–1.0)
Monocytes Relative: 8 %
Neutro Abs: 4.2 K/uL (ref 1.7–7.7)
Neutrophils Relative %: 48 %
Platelet Count: 363 K/uL (ref 150–400)
RBC: 3.66 MIL/uL — ABNORMAL LOW (ref 4.22–5.81)
RDW: 18.1 % — ABNORMAL HIGH (ref 11.5–15.5)
WBC Count: 8.9 K/uL (ref 4.0–10.5)
nRBC: 0 % (ref 0.0–0.2)

## 2024-09-12 LAB — DIRECT ANTIGLOBULIN TEST (NOT AT ARMC)
DAT, IgG: NEGATIVE
DAT, complement: NEGATIVE

## 2024-09-12 LAB — LACTATE DEHYDROGENASE: LDH: 155 U/L (ref 105–235)

## 2024-09-12 LAB — RETICULOCYTES
Immature Retic Fract: 15.5 % (ref 2.3–15.9)
RBC.: 3.67 MIL/uL — ABNORMAL LOW (ref 4.22–5.81)
Retic Count, Absolute: 49.9 K/uL (ref 19.0–186.0)
Retic Ct Pct: 1.4 % (ref 0.4–3.1)

## 2024-09-12 LAB — FERRITIN: Ferritin: 20 ng/mL — ABNORMAL LOW (ref 24–336)

## 2024-09-12 NOTE — Assessment & Plan Note (Signed)
 On 09/09/2024, labs showed hemoglobin of 9.2, hematocrit 29.6, MCV 83.  White count 9400, platelet count 391,000.  Previously iron  studies on 08/22/2024 showed iron  deficiency with iron  saturation of 9%, ferritin decreased to 23, increased iron  binding capacity.  Vitamin B12, folic acid , TSH, LDH, haptoglobin were all within normal limits on 08/12/2024.  Patient was referred to us  for further evaluation of iron  deficiency anemia.  On review of available records, patient has had intermittent anemia at least since August 2012 with hemoglobin in the range of 8.8-13.6, nadir count in June 2016.  Platelet count was low during his hospitalization in December 2020 with nadir count of 59,000.  Lately his platelet count has been normal.  He did not have leukopenia previously, with few occasions of leukocytosis, likely reactionary.  Chronic normocytic anemia with fluctuating hemoglobin since at least 2012. Recent evaluation revealed iron  deficiency as the etiology, with normal B12 and folic acid , and no evidence of hemolysis. No overt bleeding or gastrointestinal blood loss identified. He declined colonoscopy and was unable to complete stool studies. Upper endoscopy in 2018. He remains asymptomatic except for mild exertional dyspnea, likely multifactorial given his cardiac history. Family history includes gastric carcinoma in his father.  Labs today showed persistent anemia with hemoglobin of 9.4, MCV 80.6.  White count and platelet count are within normal limits.  CMP grossly unremarkable.  Iron  studies continue to show evidence of iron  deficiency with iron  saturation of 6%, ferritin of 20.  LDH normal.  Coombs test negative.  - Deferred B12 and folic acid  testing due to previously normal results.  - We will arrange for IV iron  with Venofer  or Feraheme , pending authorization.  - Scheduled follow-up in four months to reassess laboratory values and clinical status.
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
## 2024-09-15 ENCOUNTER — Other Ambulatory Visit (HOSPITAL_COMMUNITY): Payer: Self-pay

## 2024-09-15 ENCOUNTER — Other Ambulatory Visit: Payer: Self-pay | Admitting: Oncology

## 2024-09-15 ENCOUNTER — Telehealth: Payer: Self-pay

## 2024-09-15 NOTE — Telephone Encounter (Signed)
 Dr. Pasam, patient will be scheduled as soon as possible.  Auth Submission: NO AUTH NEEDED Site of care: Site of care: [facility] Payer: [employer] medicare Medication & CPT/J Code(s) submitted: Venofer  (Iron  Sucrose) J1756 Diagnosis Code:  Route of submission (phone, fax, portal):  Phone # Fax # Auth type: Buy/Bill PB Units/visits requested: 200mg  x 5 doses Reference number:  Approval from: 09/15/24 to 01/25/25

## 2024-09-18 ENCOUNTER — Ambulatory Visit (INDEPENDENT_AMBULATORY_CARE_PROVIDER_SITE_OTHER)

## 2024-09-18 VITALS — BP 123/71 | HR 58 | Temp 97.6°F | Resp 16 | Ht 60.0 in | Wt 143.8 lb

## 2024-09-18 DIAGNOSIS — D508 Other iron deficiency anemias: Secondary | ICD-10-CM

## 2024-09-18 MED ORDER — DIPHENHYDRAMINE HCL 25 MG PO CAPS
25.0000 mg | ORAL_CAPSULE | Freq: Once | ORAL | Status: AC
  Administered 2024-09-18: 25 mg via ORAL
  Filled 2024-09-18: qty 1

## 2024-09-18 MED ORDER — IRON SUCROSE 200 MG IVPB - SIMPLE MED
200.0000 mg | Freq: Once | Status: AC
  Administered 2024-09-18: 200 mg via INTRAVENOUS
  Filled 2024-09-18: qty 110

## 2024-09-18 MED ORDER — ACETAMINOPHEN 325 MG PO TABS: 650.0000 mg | ORAL_TABLET | Freq: Once | ORAL | Status: DC

## 2024-09-18 NOTE — Patient Instructions (Signed)

## 2024-09-18 NOTE — Progress Notes (Signed)
 Diagnosis: Iron  Deficiency Anemia  Provider:  Mannam, Praveen MD  Procedure: IV Infusion  IV Type: Peripheral, IV Location: R Forearm  Venofer  (Iron  Sucrose), Dose: 200 mg  Infusion Start Time: 0939  Infusion Stop Time: 0957  Post Infusion IV Care: Observation period completed and Peripheral IV Discontinued  Discharge: Condition: Good, Destination: Home . AVS Provided  Performed by:  Rocky FORBES Search, RN

## 2024-09-24 ENCOUNTER — Ambulatory Visit

## 2024-09-24 VITALS — BP 127/73 | HR 55 | Temp 98.1°F | Resp 16 | Ht 63.5 in | Wt 142.4 lb

## 2024-09-24 DIAGNOSIS — D508 Other iron deficiency anemias: Secondary | ICD-10-CM

## 2024-09-24 MED ORDER — IRON SUCROSE 200 MG IVPB - SIMPLE MED
200.0000 mg | Freq: Once | Status: AC
  Administered 2024-09-24: 200 mg via INTRAVENOUS

## 2024-09-24 MED ORDER — ACETAMINOPHEN 325 MG PO TABS: 650.0000 mg | ORAL_TABLET | Freq: Once | ORAL | Status: DC

## 2024-09-24 MED ORDER — DIPHENHYDRAMINE HCL 25 MG PO CAPS: 25.0000 mg | ORAL_CAPSULE | Freq: Once | ORAL | Status: DC

## 2024-09-24 NOTE — Progress Notes (Signed)
 Diagnosis: , Acute Anemia  Provider:  Lonna Coder MD  Procedure: IV Infusion  IV Type: Peripheral, IV Location: R Forearm  , Venofer  (Iron  Sucrose), Dose: 200 mg  Infusion Start Time: 1336  Infusion Stop Time: 1352  Post Infusion IV Care: Patient declined observation and Peripheral IV Discontinued  Discharge: Condition: Good, Destination: Home . AVS Provided  Performed by:  Donny Childes, RN

## 2024-09-30 ENCOUNTER — Ambulatory Visit (INDEPENDENT_AMBULATORY_CARE_PROVIDER_SITE_OTHER)

## 2024-09-30 VITALS — BP 128/62 | HR 55 | Temp 97.7°F | Resp 20 | Ht 63.0 in | Wt 138.8 lb

## 2024-09-30 DIAGNOSIS — D508 Other iron deficiency anemias: Secondary | ICD-10-CM

## 2024-09-30 MED ORDER — DIPHENHYDRAMINE HCL 25 MG PO CAPS: 25.0000 mg | ORAL_CAPSULE | Freq: Once | ORAL | Status: DC

## 2024-09-30 MED ORDER — SODIUM CHLORIDE 0.9 % IV SOLN
200.0000 mg | Freq: Once | INTRAVENOUS | Status: AC
  Administered 2024-09-30: 200 mg via INTRAVENOUS
  Filled 2024-09-30: qty 10

## 2024-09-30 MED ORDER — ACETAMINOPHEN 325 MG PO TABS: 650.0000 mg | ORAL_TABLET | Freq: Once | ORAL | Status: DC

## 2024-09-30 NOTE — Progress Notes (Signed)
 Diagnosis: Iron  Deficiency Anemia  Provider:  Praveen Mannam MD  Procedure: IV Infusion  IV Type: Peripheral, IV Location: R Forearm  Venofer  (Iron  Sucrose), Dose: 200 mg  Infusion Start Time: 1040  Infusion Stop Time: 1055  Post Infusion IV Care: Patient declined observation and Peripheral IV Discontinued  Discharge: Condition: Good, Destination: Home . AVS Declined  Performed by:  Khushboo Chuck, RN

## 2024-10-02 ENCOUNTER — Ambulatory Visit

## 2024-10-02 VITALS — BP 150/68 | HR 58 | Temp 97.8°F | Resp 18 | Ht 63.0 in | Wt 139.0 lb

## 2024-10-02 DIAGNOSIS — D508 Other iron deficiency anemias: Secondary | ICD-10-CM

## 2024-10-02 MED ORDER — DIPHENHYDRAMINE HCL 25 MG PO CAPS: 25.0000 mg | ORAL_CAPSULE | Freq: Once | ORAL | Status: DC

## 2024-10-02 MED ORDER — SODIUM CHLORIDE 0.9 % IV SOLN
200.0000 mg | Freq: Once | INTRAVENOUS | Status: AC
  Administered 2024-10-02: 200 mg via INTRAVENOUS
  Filled 2024-10-02: qty 10

## 2024-10-02 MED ORDER — ACETAMINOPHEN 325 MG PO TABS: 650.0000 mg | ORAL_TABLET | Freq: Once | ORAL | Status: DC

## 2024-10-02 NOTE — Progress Notes (Signed)
 Diagnosis: Iron  Deficiency Anemia  Provider:  Mannam, Praveen MD  Procedure: IV Infusion  IV Type: Peripheral, IV Location: R Forearm  Venofer  (Iron  Sucrose), Dose: 200 mg  Infusion Start Time: 0928  Infusion Stop Time: 0943  Post Infusion IV Care: Patient declined observation and Peripheral IV Discontinued  Discharge: Condition: Good, Destination: Home . AVS Provided  Performed by:  Rocky FORBES Search, RN

## 2024-10-03 ENCOUNTER — Telehealth: Payer: Self-pay

## 2024-10-03 NOTE — Telephone Encounter (Signed)
 Called patient for more [employer]/Medicare information needed to complete Chronic Condition Form. LMTCB

## 2024-10-07 ENCOUNTER — Ambulatory Visit

## 2024-10-10 ENCOUNTER — Ambulatory Visit: Payer: Medicare HMO

## 2024-10-13 ENCOUNTER — Ambulatory Visit

## 2024-11-11 ENCOUNTER — Ambulatory Visit: Admitting: Cardiology

## 2024-11-27 ENCOUNTER — Ambulatory Visit (HOSPITAL_COMMUNITY)

## 2024-12-05 ENCOUNTER — Ambulatory Visit: Admitting: Internal Medicine

## 2024-12-09 ENCOUNTER — Ambulatory Visit: Admitting: Family Medicine

## 2025-01-08 ENCOUNTER — Inpatient Hospital Stay: Attending: Oncology

## 2025-01-08 ENCOUNTER — Inpatient Hospital Stay: Admitting: Oncology

## 2025-06-16 ENCOUNTER — Encounter (INDEPENDENT_AMBULATORY_CARE_PROVIDER_SITE_OTHER): Admitting: Ophthalmology
